# Patient Record
Sex: Male | Born: 1965 | Race: White | Hispanic: No | Marital: Single | Smoking: Current every day smoker
Health system: Southern US, Community
[De-identification: ages and names within clinical notes are randomized; demographics above are authoritative.]

## PROBLEM LIST (undated history)

## (undated) DIAGNOSIS — F419 Anxiety disorder, unspecified: Secondary | ICD-10-CM

## (undated) DIAGNOSIS — Z8719 Personal history of other diseases of the digestive system: Secondary | ICD-10-CM

## (undated) DIAGNOSIS — F32A Depression, unspecified: Secondary | ICD-10-CM

## (undated) DIAGNOSIS — K219 Gastro-esophageal reflux disease without esophagitis: Secondary | ICD-10-CM

## (undated) DIAGNOSIS — R0600 Dyspnea, unspecified: Secondary | ICD-10-CM

## (undated) DIAGNOSIS — D126 Benign neoplasm of colon, unspecified: Secondary | ICD-10-CM

## (undated) DIAGNOSIS — J449 Chronic obstructive pulmonary disease, unspecified: Secondary | ICD-10-CM

## (undated) DIAGNOSIS — I10 Essential (primary) hypertension: Secondary | ICD-10-CM

## (undated) DIAGNOSIS — K859 Acute pancreatitis without necrosis or infection, unspecified: Secondary | ICD-10-CM

## (undated) DIAGNOSIS — I639 Cerebral infarction, unspecified: Secondary | ICD-10-CM

## (undated) DIAGNOSIS — F1910 Other psychoactive substance abuse, uncomplicated: Secondary | ICD-10-CM

## (undated) DIAGNOSIS — N289 Disorder of kidney and ureter, unspecified: Secondary | ICD-10-CM

## (undated) DIAGNOSIS — F1210 Cannabis abuse, uncomplicated: Secondary | ICD-10-CM

## (undated) DIAGNOSIS — G4730 Sleep apnea, unspecified: Secondary | ICD-10-CM

## (undated) DIAGNOSIS — C801 Malignant (primary) neoplasm, unspecified: Secondary | ICD-10-CM

## (undated) DIAGNOSIS — G459 Transient cerebral ischemic attack, unspecified: Secondary | ICD-10-CM

## (undated) DIAGNOSIS — F329 Major depressive disorder, single episode, unspecified: Secondary | ICD-10-CM

## (undated) DIAGNOSIS — M1990 Unspecified osteoarthritis, unspecified site: Secondary | ICD-10-CM

## (undated) DIAGNOSIS — K5720 Diverticulitis of large intestine with perforation and abscess without bleeding: Secondary | ICD-10-CM

## (undated) DIAGNOSIS — G510 Bell's palsy: Secondary | ICD-10-CM

## (undated) HISTORY — PX: FOOT SURGERY: SHX648

## (undated) HISTORY — DX: Disorder of kidney and ureter, unspecified: N28.9

## (undated) HISTORY — DX: Transient cerebral ischemic attack, unspecified: G45.9

## (undated) HISTORY — DX: Cannabis abuse, uncomplicated: F12.10

## (undated) HISTORY — DX: Bell's palsy: G51.0

## (undated) HISTORY — DX: Chronic obstructive pulmonary disease, unspecified: J44.9

## (undated) HISTORY — DX: Anxiety disorder, unspecified: F41.9

## (undated) HISTORY — DX: Benign neoplasm of colon, unspecified: D12.6

## (undated) HISTORY — PX: APPENDECTOMY: SHX54

## (undated) HISTORY — DX: Acute pancreatitis without necrosis or infection, unspecified: K85.90

## (undated) HISTORY — DX: Other psychoactive substance abuse, uncomplicated: F19.10

## (undated) HISTORY — DX: Diverticulitis of large intestine with perforation and abscess without bleeding: K57.20

## (undated) HISTORY — PX: HERNIA REPAIR: SHX51

## (undated) HISTORY — DX: Essential (primary) hypertension: I10

## (undated) HISTORY — PX: UMBILICAL HERNIA REPAIR: SHX196

## (undated) HISTORY — PX: OTHER SURGICAL HISTORY: SHX169

---

## 2004-09-03 ENCOUNTER — Ambulatory Visit (HOSPITAL_COMMUNITY): Admission: RE | Admit: 2004-09-03 | Discharge: 2004-09-03 | Payer: Self-pay | Admitting: Family Medicine

## 2004-09-08 HISTORY — PX: KNEE SURGERY: SHX244

## 2005-05-01 ENCOUNTER — Emergency Department (HOSPITAL_COMMUNITY): Admission: EM | Admit: 2005-05-01 | Discharge: 2005-05-01 | Payer: Self-pay | Admitting: Emergency Medicine

## 2007-03-21 ENCOUNTER — Inpatient Hospital Stay (HOSPITAL_COMMUNITY): Admission: EM | Admit: 2007-03-21 | Discharge: 2007-03-24 | Payer: Self-pay | Admitting: Emergency Medicine

## 2009-10-04 ENCOUNTER — Encounter (INDEPENDENT_AMBULATORY_CARE_PROVIDER_SITE_OTHER): Payer: Self-pay | Admitting: *Deleted

## 2009-10-05 ENCOUNTER — Encounter (INDEPENDENT_AMBULATORY_CARE_PROVIDER_SITE_OTHER): Payer: Self-pay | Admitting: *Deleted

## 2009-10-05 ENCOUNTER — Ambulatory Visit (HOSPITAL_COMMUNITY): Admission: RE | Admit: 2009-10-05 | Discharge: 2009-10-05 | Payer: Self-pay | Admitting: Internal Medicine

## 2009-10-05 ENCOUNTER — Ambulatory Visit: Payer: Self-pay | Admitting: Internal Medicine

## 2009-10-05 DIAGNOSIS — K859 Acute pancreatitis without necrosis or infection, unspecified: Secondary | ICD-10-CM | POA: Insufficient documentation

## 2009-10-05 DIAGNOSIS — K921 Melena: Secondary | ICD-10-CM | POA: Insufficient documentation

## 2009-10-05 DIAGNOSIS — F419 Anxiety disorder, unspecified: Secondary | ICD-10-CM | POA: Insufficient documentation

## 2009-10-05 DIAGNOSIS — K920 Hematemesis: Secondary | ICD-10-CM | POA: Insufficient documentation

## 2009-10-05 DIAGNOSIS — F32A Depression, unspecified: Secondary | ICD-10-CM | POA: Insufficient documentation

## 2009-10-05 DIAGNOSIS — F1210 Cannabis abuse, uncomplicated: Secondary | ICD-10-CM | POA: Insufficient documentation

## 2009-10-05 DIAGNOSIS — I10 Essential (primary) hypertension: Secondary | ICD-10-CM | POA: Insufficient documentation

## 2009-10-05 DIAGNOSIS — F411 Generalized anxiety disorder: Secondary | ICD-10-CM | POA: Insufficient documentation

## 2009-10-10 ENCOUNTER — Encounter: Payer: Self-pay | Admitting: Internal Medicine

## 2009-10-12 ENCOUNTER — Encounter: Payer: Self-pay | Admitting: Internal Medicine

## 2010-08-08 DIAGNOSIS — K5720 Diverticulitis of large intestine with perforation and abscess without bleeding: Secondary | ICD-10-CM

## 2010-08-08 HISTORY — DX: Diverticulitis of large intestine with perforation and abscess without bleeding: K57.20

## 2010-08-08 HISTORY — PX: OTHER SURGICAL HISTORY: SHX169

## 2010-10-08 NOTE — Letter (Signed)
Summary: CT SCAN ORDER  CT SCAN ORDER   Imported By: Ricard Dillon 10/10/2009 12:33:42  _____________________________________________________________________  External Attachment:    Type:   Image     Comment:   External Document

## 2010-10-08 NOTE — Letter (Signed)
Summary: Out of Work Note  [facility]  [address]   [place], [place] [address]   Phone: [phone]  Fax: [phone]    10/05/2009  TO: Leodis Sias IT MAY CONCERN  RE: [name] [address] [id number] 07/30/1966       The above named individual is currently under my care and will be out of work    FROM: 10/05/2009   THROUGH: 10/06/2009    REASON: Patient is unable to work due to medical reasons.    MAY RETURN ON: 10/08/2009     If you have any further questions or need additional information, please call.     Sincerely,     [facility] R. Roetta Sessions, M.D.    Kassie Mends, M.D. Lorenza Burton, FNP-BC    Tana Coast, PA-C Phone: [phone]    Fax: [phone]

## 2010-10-08 NOTE — Letter (Signed)
Summary: Appointment Reminder  [facility]  [address]   [place], [place] [address]   Phone: [phone]  Fax: [phone]       October 04, 2009   ZAION HREHA [address] [place], [place]  [address] 04-Jul-1966    Dear Mr. Torian,  We have been unable to reach you by phone to schedule a follow up   appointment that was recommended for you by Dr. Jena Gauss. It is very   important that we reach you to schedule an appointment. We hope that you  allow [place] to participate in your health care needs. Please contact us at  [phone] at your earliest convenience to schedule your appointment.  Sincerely,    [facility] R. Roetta Sessions, M.D.    Kassie Mends, M.D. Lorenza Burton, FNP-BC    Tana Coast, PA-C Phone: [phone]    Fax: [phone]

## 2010-10-08 NOTE — Letter (Signed)
Summary: REFERRAL/[facility]/DR HOWARD  REFERRAL/[facility]/DR HOWARD   Imported By: Diana Eves 10/12/2009 10:30:27  _____________________________________________________________________  External Attachment:    Type:   Image     Comment:   External Document
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
## 2010-10-08 NOTE — Letter (Signed)
Summary: LABS/[facility]  LABS/[facility]   Imported By: Diana Eves 10/12/2009 11:59:52  _____________________________________________________________________  External Attachment:    Type:   Image     Comment:   External Document
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
## 2011-04-09 HISTORY — PX: OTHER SURGICAL HISTORY: SHX169

## 2011-05-10 HISTORY — PX: COLON SURGERY: SHX602

## 2011-06-23 LAB — DIFFERENTIAL
Basophils Absolute: 0
Basophils Relative: 0
Eosinophils Absolute: 0.1
Eosinophils Relative: 1
Lymphocytes Relative: 26
Lymphs Abs: 1.9
Monocytes Absolute: 0.6
Monocytes Relative: 8
Neutro Abs: 4.8
Neutrophils Relative %: 65

## 2011-06-23 LAB — CBC
HCT: 36.5 — ABNORMAL LOW
Hemoglobin: 12.7 — ABNORMAL LOW
MCHC: 34.7
MCV: 92.4
Platelets: 175
RBC: 3.95 — ABNORMAL LOW
RDW: 13
WBC: 7.4

## 2011-06-24 LAB — POCT I-STAT EG7
Acid-base deficit: 3 — ABNORMAL HIGH
Bicarbonate: 21.7
HCT: 46
Hemoglobin: 13.3
Hemoglobin: 15.6
O2 Saturation: 100
Operator id: 230421
Potassium: 4.4
Sodium: 136
Sodium: 137
TCO2: 23
pH, Ven: 7.294
pH, Ven: 7.39 — ABNORMAL HIGH

## 2011-06-24 LAB — BASIC METABOLIC PANEL
CO2: 30
Chloride: 99
Glucose, Bld: 109 — ABNORMAL HIGH
Potassium: 4.4
Sodium: 137

## 2011-06-24 LAB — ETHANOL: Alcohol, Ethyl (B): 192 — ABNORMAL HIGH

## 2011-06-24 LAB — CBC
HCT: 48
Hemoglobin: 16.5
Platelets: 247
WBC: 13.6 — ABNORMAL HIGH

## 2011-06-24 LAB — SAMPLE TO BLOOD BANK

## 2011-10-10 HISTORY — PX: OTHER SURGICAL HISTORY: SHX169

## 2012-02-09 ENCOUNTER — Encounter: Payer: Self-pay | Admitting: Internal Medicine

## 2012-02-09 ENCOUNTER — Other Ambulatory Visit (HOSPITAL_COMMUNITY): Payer: Self-pay | Admitting: Oral Surgery

## 2012-02-10 ENCOUNTER — Ambulatory Visit (INDEPENDENT_AMBULATORY_CARE_PROVIDER_SITE_OTHER): Payer: Medicaid Other | Admitting: Gastroenterology

## 2012-02-10 ENCOUNTER — Encounter: Payer: Self-pay | Admitting: Gastroenterology

## 2012-02-10 VITALS — BP 148/104 | HR 80 | Temp 97.1°F | Ht 66.0 in | Wt 190.4 lb

## 2012-02-10 DIAGNOSIS — G8929 Other chronic pain: Secondary | ICD-10-CM | POA: Insufficient documentation

## 2012-02-10 DIAGNOSIS — K5720 Diverticulitis of large intestine with perforation and abscess without bleeding: Secondary | ICD-10-CM | POA: Insufficient documentation

## 2012-02-10 DIAGNOSIS — R110 Nausea: Secondary | ICD-10-CM | POA: Insufficient documentation

## 2012-02-10 DIAGNOSIS — R1084 Generalized abdominal pain: Secondary | ICD-10-CM

## 2012-02-10 DIAGNOSIS — K5732 Diverticulitis of large intestine without perforation or abscess without bleeding: Secondary | ICD-10-CM

## 2012-02-10 DIAGNOSIS — R1013 Epigastric pain: Secondary | ICD-10-CM | POA: Insufficient documentation

## 2012-02-10 MED ORDER — OXYCODONE-ACETAMINOPHEN 7.5-325 MG PO TABS: 1.0000 | ORAL_TABLET | Freq: Every evening | ORAL | Status: DC | PRN

## 2012-02-10 NOTE — Assessment & Plan Note (Signed)
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 Patient required ileostomy prior to eventual takedown. He has had three surgeries related to this. Extensive lysis of adhesions with second surgery (over 3 hours). Suspect chronic abdominal pain secondary to multiple surgeries/adhesion. His upper GI tract has never been evaluated. Offered him EGD. Will augment conscious sedation with phenergan 25mg  iv 30 mins before procedure.   Retrieve more recent records and labs from [facility] and Dr. Fausto Skillern. Temporary RX for oxycodone to take at bedtime only. Ultimately he will likely need pain clinic referral. Patient states he cannot get to [facility], ?go to Dr. Gerilyn Pilgrim or [place].

## 2012-02-10 NOTE — Progress Notes (Signed)
Faxed to PCP

## 2012-02-10 NOTE — Progress Notes (Signed)
Primary Care Physician:  HOWARD, KEVIN, MD, MD  Primary Gastroenterologist:  Michael Rourk, MD   Chief Complaint  Patient presents with  . Abdominal Pain    HPI:  [name] is a 46 y.o. male here for further evaluation of chronic abdominal pain. He was last seen back in January 2011. At that time he had a several month history of him he'll call pain. In January 2011 he was diagnosed with acute pancreatitis that [facility]. He did have a CT of the abdomen pelvis at that time which was unremarkable except for stable T10 compression fracture. He was offered EGD and colonoscopy but he ended up incarcerated so the procedures were postponed.  While he was incarcerated, he finally convinced them to perform a colonoscopy. He states after he drank his prep he had abdominal pain and was found out that he had perforated diverticulitis. 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 This was in December 2011. In August 2012 he underwent a colonoscopy via stoma with endoscopy of Hartmann pouch for preoperative assessment for colostomy takedown. He had 2 four to six mm polyps in the transverse colon with complete resection with only partial retrieval. Path revealed adenomatous polyp. He also congested mucosa in the area 3 cm proximal to the stoma, erythematous because of Hartmann pouch.   In September 2012 he had exploratory laparotomy with colostomy takedown, mobilization of splenic and hepatic flexure, repair of rectal laceration, lysis of adhesions (greater than 3 hours), loop ileostomy. This procedure was complicated by abscess formation which required percutaneous drainage. I do not have the records, but the patient states that he underwent reversal of ileostomy in February of this year.  Patient complains of chronic periumbilical abdominal pain. Tolerates during the day with tramadol but every night he wakes up at 2am with sharp, persistent abdominal pain.  Used to be on oxycodone around the clock before Dr. Fanta gives him tramadol now. Used to be followed at pain clinic at [facility] but he has no way to get there now. [facility] will not see him (per patient "they don't handle abdominal pain".  Some pp nausea and abd pain. Pain periumbilical , sharp pain, stays for hours. Heartburn controlled on ranitidine. No dysphagia. Stools are real black last couple of days. Iron pills for one week. No rectal bleeding.   Current Outpatient Prescriptions  Medication Sig Dispense Refill  . ALPRAZolam (XANAX) 1 MG tablet Take 1 mg by mouth at bedtime as needed.      . cyclobenzaprine (FLEXERIL) 5 MG tablet Take 5 mg by mouth 3 (three) times daily as needed.      . ferrous sulfate 325 (65 FE) MG tablet Take 325 mg by mouth 2 (two) times daily.      . gabapentin (NEURONTIN) 300 MG capsule Take 300 mg by mouth 3 (three) times daily.      . PARoxetine (PAXIL) 20 MG tablet Take 20 mg by mouth every morning.      . ranitidine (ZANTAC) 150 MG capsule Take 150 mg by mouth 2 (two) times daily.      . simvastatin (ZOCOR) 20 MG tablet Take 20 mg by mouth daily.      . sodium bicarbonate 650 MG tablet Take 650 mg by mouth 2 (two) times daily.      . traMADol (ULTRAM) 50 MG tablet Take 50 mg by mouth every 6 (six) hours as needed.        Allergies as of   02/10/2012 - Review Complete 02/10/2012  Allergen Reaction Noted  . Codeine Itching   . Morphine Hives    Past Medical History  Diagnosis Date  . Acute pancreatitis   . Cannabis abuse   . Anxiety   . Hypertension   . Diverticulitis of colon with perforation 08/2010  . Renal insufficiency     follwed by Dr. Befakadu   Past Surgical History  Procedure Date  . Appendectomy   . Umbilical hernia repair   . Right knee surgery   . 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 complicated diverticulitis  . Colonoscopy via stoma with endoscopy of hartmann pouch 04/2011    [facility]. two  sessile polyps in trv colon. complete resection but only partial retrieval. congested mucosa in area 3cm proximal to stoma, erythematous mucosa in Hartmann pouch. repeat tcs 8/2017path-adenomatous polyp  . Colon surgery 05/2011    [facility]. exp laparotomy with colostomy takedown, lysis of adhesions (3hours), loop ileostomy. complicated by abscess formation requiring percutaneous drainage  . Reversal of colostomy 10/2011    per patient, did not have report   Family History  Problem Relation Age of Onset  . Stomach cancer Father   . Lung cancer Father   . Throat cancer Father   . Colon cancer Neg Hx   . Liver disease Cousin     cirrhosis, etoh    History   Social History  . Marital Status: Single    Spouse Name: N/A    Number of Children: N/A  . Years of Education: N/A   Occupational History  . Not on file.   Social History Main Topics  . Smoking status: Current Everyday Smoker -- 1.0 packs/day    Types: Cigarettes  . Smokeless tobacco: Not on file  . Alcohol Use: No     former user quit in 2011  . Drug Use: Yes    Special: Marijuana     last time was a week ago  . Sexually Active: Not on file   Other Topics Concern  . Not on file   Social History Narrative  . No narrative on file      ROS:  General: Negative for anorexia, weight loss, fever, chills, fatigue, weakness. Eyes: Negative for vision changes.  ENT: Negative for hoarseness, difficulty swallowing , nasal congestion. To have teeth pulled this month CV: Negative for chest pain, angina, palpitations, dyspnea on exertion, peripheral edema.  Respiratory: Negative for dyspnea at rest, dyspnea on exertion, cough, sputum, wheezing.  GI: See history of present illness. GU:  Negative for dysuria, hematuria, urinary incontinence, urinary frequency, nocturnal urination.  MS: Negative for joint pain, low back pain.  Derm: Negative for rash or itching.  Neuro: Negative for weakness, abnormal sensation, seizure, frequent  headaches, memory loss, confusion.  Psych: c/o anxiety. no depression, suicidal ideation, hallucinations.  Endo: Negative for unusual weight change.  Heme: Negative for bruising or bleeding. Allergy: Negative for rash or hives.    Physical Examination:  BP 148/104  Pulse 80  Temp(Src) 97.1 F (36.2 C) (Temporal)  Ht 5' 6" (1.676 m)  Wt 190 lb 6.4 oz (86.365 kg)  BMI 30.73 kg/m2   General: Well-nourished, well-developed in no acute distress.  Head: Normocephalic, atraumatic.   Eyes: Conjunctiva pink, no icterus. Mouth: Oropharyngeal mucosa moist and pink , no lesions erythema or exudate. Teeth in poor repair. Neck: Supple without thyromegaly, masses, or lymphadenopathy.  Lungs: Clear to auscultation bilaterally.  Heart: Regular rate and   rhythm, no murmurs rubs or gallops.  Abdomen: Bowel sounds are normal, diffuse tenderness especially in midline and epigastrium, nondistended, no hepatosplenomegaly or masses, no abdominal bruits or    hernia , no rebound or guarding.  Extensive scarring in midline, two ostomy sites.  Rectal: not performed Extremities: No lower extremity edema. No clubbing or deformities.  Neuro: Alert and oriented x 4 , grossly normal neurologically.  Skin: Warm and dry, no rash or jaundice.   Psych: Alert and cooperative, normal mood and affect.    

## 2012-02-10 NOTE — Patient Instructions (Signed)
We will review all your past records.  We have scheduled you for an upper endoscopy to further evaluate your abdominal pain. Please see separate instructions.

## 2012-02-12 ENCOUNTER — Encounter (HOSPITAL_COMMUNITY): Payer: Self-pay | Admitting: Pharmacy Technician

## 2012-02-17 ENCOUNTER — Encounter (HOSPITAL_COMMUNITY): Payer: Self-pay

## 2012-02-17 NOTE — Pre-Procedure Instructions (Signed)
20 CAEDAN SUMLER  02/17/2012   Your procedure is scheduled on:  February 23, 2012 @ 10:30   Report to [facility] at 7:30 AM.  Call this number if you have problems the morning of surgery: [phone]   Remember:   Do not eat food:After Midnight.  May have clear liquids: up to 4 Hours before arrival.  Clear liquids include soda, tea, black coffee, apple or grape juice, broth.  Take these medicines the morning of surgery with A SIP OF WATER: xanax, pain pill as needed ,paxil, zantac   Do not wear jewelry, make-up or nail polish.  Do not wear lotions, powders, or perfumes. You may wear deodorant.  Do not shave 48 hours prior to surgery. Men may shave face and neck.  Do not bring valuables to the hospital.  Contacts, dentures or bridgework may not be worn into surgery.  Leave suitcase in the car. After surgery it may be brought to your room.  For patients admitted to the hospital, checkout time is 11:00 AM the day of discharge.   Patients discharged the day of surgery will not be allowed to drive home.  Name and phone number of your driver: Crandall Harvel  Special Instructions: CHG Shower Use Special Wash: 1/2 bottle night before surgery and 1/2 bottle morning of surgery.   Please read over the following fact sheets that you were given: Pain Booklet, Coughing and Deep Breathing and Surgical Site Infection Prevention

## 2012-02-19 ENCOUNTER — Encounter (HOSPITAL_COMMUNITY)
Admission: RE | Admit: 2012-02-19 | Discharge: 2012-02-19 | Disposition: A | Discharge status: Home / Self Care | Payer: Medicaid Other | Source: Ambulatory Visit | Attending: Oral Surgery | Admitting: Oral Surgery

## 2012-02-19 LAB — COMPREHENSIVE METABOLIC PANEL
Albumin: 3.8 g/dL (ref 3.5–5.2)
BUN: 17 mg/dL (ref 6–23)
CO2: 25 mEq/L (ref 19–32)
Calcium: 10.2 mg/dL (ref 8.4–10.5)
Chloride: 103 mEq/L (ref 96–112)
Creatinine, Ser: 1.15 mg/dL (ref 0.50–1.35)
GFR calc non Af Amer: 75 mL/min — ABNORMAL LOW (ref >90)
Total Bilirubin: 0.1 mg/dL — ABNORMAL LOW (ref 0.3–1.2)

## 2012-02-19 LAB — CBC
HCT: 44.7 % (ref 39.0–52.0)
MCH: 30.3 pg (ref 26.0–34.0)
MCV: 89 fL (ref 78.0–100.0)
RDW: 14.6 % (ref 11.5–15.5)
WBC: 7 10*3/uL (ref 4.0–10.5)

## 2012-02-19 NOTE — Progress Notes (Signed)
I spoke with Selena Batten at Dr. Randa Evens office and asked to have orders signed by MD- pt arriving for a re- op lab appointment today.

## 2012-02-19 NOTE — H&P (Signed)
HISTORY AND PHYSICAL  [name] is a 46 y.o. male patient with CC: Painful teeth  No diagnosis found.  Past Medical History  Diagnosis Date  . Acute pancreatitis   . Cannabis abuse   . Anxiety   . Diverticulitis of colon with perforation 08/2010  . Renal insufficiency     follwed by Dr. Fausto Skillern  . GERD (gastroesophageal reflux disease)   . Hypertension     taken off lisinopril by renal md 08/2011    No current facility-administered medications for this encounter.   Current Outpatient Prescriptions  Medication Sig Dispense Refill  . ALPRAZolam (XANAX) 1 MG tablet Take 1 mg by mouth 4 (four) times daily.       . cyclobenzaprine (FLEXERIL) 5 MG tablet Take 5 mg by mouth 3 (three) times daily.       . ferrous sulfate 325 (65 FE) MG tablet Take 325 mg by mouth 2 (two) times daily.      Marland Kitchen gabapentin (NEURONTIN) 300 MG capsule Take 300 mg by mouth 3 (three) times daily.      Marland Kitchen oxyCODONE-acetaminophen (PERCOCET) 7.5-325 MG per tablet Take 1 tablet by mouth at bedtime as needed. For sleep/pain      . PARoxetine (PAXIL) 20 MG tablet Take 20 mg by mouth every morning.      . ranitidine (ZANTAC) 150 MG capsule Take 150 mg by mouth 2 (two) times daily.      . simvastatin (ZOCOR) 20 MG tablet Take 20 mg by mouth daily.      . sodium bicarbonate 650 MG tablet Take 650 mg by mouth 2 (two) times daily.      . traMADol (ULTRAM) 50 MG tablet Take 50 mg by mouth every 6 (six) hours as needed. For pain       Allergies  Allergen Reactions  . Codeine Itching  . Morphine Hives   Active Problems:  * No active hospital problems. *   Vitals: There were no vitals taken for this visit. Lab results:No results found for this or any previous visit (from the past 24 hour(s)). Radiology Results: No results found. General appearance: alert and cooperative Head: Normocephalic, without obvious abnormality, atraumatic Eyes: negative Nose: Nares normal. Septum midline. Mucosa normal. No drainage or  sinus tenderness. Throat: Severe dental caries teeth #'s 2, 4, 5, 6, 7, 8, 9, 10, 11, 12, 13, 22, 23, 24, 27, 28, 29, 31, 32, Bilateral Mandibular Tori Neck: no adenopathy and supple, symmetrical, trachea midline Resp: wheezes RUL Cardio: regular rate and rhythm, S1, S2 normal, no murmur, click, rub or gallop  Assessment:45 WM Hypertension, pancreatitis, GERD, Renal insufficiency with non-restorable teeth #'s 2, 4, 5, 6, 7, 8, 9, 10, 11, 12, 13, 22, 23, 24, 27, 28, 29, 31, 32, Bilateral Mandibular Tori.  Plan: Extraction teeth #'s 2, 4, 5, 6, 7, 8, 9, 10, 11, 12, 13, 22, 23, 24, 27, 28, 29, 31, 32, Alveoloplasty, removal Bilateral Mandibular Tori.   Georgia Lopes 02/19/2012

## 2012-02-23 ENCOUNTER — Ambulatory Visit (HOSPITAL_COMMUNITY): Payer: Medicaid Other | Admitting: Certified Registered Nurse Anesthetist

## 2012-02-23 ENCOUNTER — Encounter (HOSPITAL_COMMUNITY): Payer: Self-pay | Admitting: Certified Registered Nurse Anesthetist

## 2012-02-23 ENCOUNTER — Encounter (HOSPITAL_COMMUNITY)
Admission: RE | Disposition: A | Discharge status: Home / Self Care | Payer: Self-pay | Source: Ambulatory Visit | Attending: Oral Surgery

## 2012-02-23 ENCOUNTER — Ambulatory Visit (HOSPITAL_COMMUNITY): Payer: Medicaid Other

## 2012-02-23 ENCOUNTER — Ambulatory Visit (HOSPITAL_COMMUNITY)
Admission: RE | Admit: 2012-02-23 | Discharge: 2012-02-23 | Disposition: A | Discharge status: Home / Self Care | Payer: Medicaid Other | Source: Ambulatory Visit | Attending: Oral Surgery | Admitting: Oral Surgery

## 2012-02-23 ENCOUNTER — Encounter (HOSPITAL_COMMUNITY): Payer: Self-pay | Admitting: *Deleted

## 2012-02-23 DIAGNOSIS — M278 Other specified diseases of jaws: Secondary | ICD-10-CM | POA: Insufficient documentation

## 2012-02-23 DIAGNOSIS — R1013 Epigastric pain: Secondary | ICD-10-CM

## 2012-02-23 DIAGNOSIS — K05229 Aggressive periodontitis, generalized, unspecified severity: Secondary | ICD-10-CM

## 2012-02-23 DIAGNOSIS — F17200 Nicotine dependence, unspecified, uncomplicated: Secondary | ICD-10-CM | POA: Insufficient documentation

## 2012-02-23 DIAGNOSIS — R110 Nausea: Secondary | ICD-10-CM

## 2012-02-23 DIAGNOSIS — I10 Essential (primary) hypertension: Secondary | ICD-10-CM

## 2012-02-23 DIAGNOSIS — K5720 Diverticulitis of large intestine with perforation and abscess without bleeding: Secondary | ICD-10-CM

## 2012-02-23 DIAGNOSIS — K921 Melena: Secondary | ICD-10-CM

## 2012-02-23 DIAGNOSIS — F1210 Cannabis abuse, uncomplicated: Secondary | ICD-10-CM

## 2012-02-23 DIAGNOSIS — N289 Disorder of kidney and ureter, unspecified: Secondary | ICD-10-CM | POA: Insufficient documentation

## 2012-02-23 DIAGNOSIS — K056 Periodontal disease, unspecified: Secondary | ICD-10-CM | POA: Insufficient documentation

## 2012-02-23 DIAGNOSIS — K859 Acute pancreatitis without necrosis or infection, unspecified: Secondary | ICD-10-CM

## 2012-02-23 DIAGNOSIS — K219 Gastro-esophageal reflux disease without esophagitis: Secondary | ICD-10-CM | POA: Insufficient documentation

## 2012-02-23 DIAGNOSIS — K920 Hematemesis: Secondary | ICD-10-CM

## 2012-02-23 DIAGNOSIS — K029 Dental caries, unspecified: Secondary | ICD-10-CM | POA: Insufficient documentation

## 2012-02-23 DIAGNOSIS — Z01812 Encounter for preprocedural laboratory examination: Secondary | ICD-10-CM | POA: Insufficient documentation

## 2012-02-23 DIAGNOSIS — F411 Generalized anxiety disorder: Secondary | ICD-10-CM

## 2012-02-23 DIAGNOSIS — G8929 Other chronic pain: Secondary | ICD-10-CM

## 2012-02-23 HISTORY — PX: MULTIPLE EXTRACTIONS WITH ALVEOLOPLASTY: SHX5342

## 2012-02-23 HISTORY — DX: Gastro-esophageal reflux disease without esophagitis: K21.9

## 2012-02-23 SURGERY — MULTIPLE EXTRACTION WITH ALVEOLOPLASTY
Anesthesia: General | Laterality: Bilateral | Wound class: Clean Contaminated

## 2012-02-23 MED ORDER — LIDOCAINE HCL (CARDIAC) 20 MG/ML IV SOLN
INTRAVENOUS | Status: DC | PRN
  Administered 2012-02-23: 100 mg via INTRAVENOUS

## 2012-02-23 MED ORDER — GLYCOPYRROLATE 0.2 MG/ML IJ SOLN
INTRAMUSCULAR | Status: DC | PRN
  Administered 2012-02-23: .8 mg via INTRAVENOUS

## 2012-02-23 MED ORDER — KETOROLAC TROMETHAMINE 30 MG/ML IJ SOLN: 15.0000 mg | Freq: Once | INTRAMUSCULAR | Status: DC | PRN

## 2012-02-23 MED ORDER — LIDOCAINE-EPINEPHRINE 2 %-1:100000 IJ SOLN
20.0000 mL | INTRAMUSCULAR | Status: AC
  Administered 2012-02-23: 18 mL
  Filled 2012-02-23: qty 20

## 2012-02-23 MED ORDER — EPHEDRINE SULFATE 50 MG/ML IJ SOLN
INTRAMUSCULAR | Status: DC | PRN
  Administered 2012-02-23: 20 mg via INTRAVENOUS

## 2012-02-23 MED ORDER — FENTANYL CITRATE 0.05 MG/ML IJ SOLN
INTRAMUSCULAR | Status: DC | PRN
  Administered 2012-02-23 (×2): 50 ug via INTRAVENOUS
  Administered 2012-02-23: 150 ug via INTRAVENOUS

## 2012-02-23 MED ORDER — STERILE WATER FOR IRRIGATION IR SOLN
Status: DC | PRN
  Administered 2012-02-23: 1000 mL

## 2012-02-23 MED ORDER — OXYCODONE-ACETAMINOPHEN 10-325 MG PO TABS: 1.0000 | ORAL_TABLET | ORAL | Status: DC | PRN

## 2012-02-23 MED ORDER — HYDROMORPHONE HCL PF 1 MG/ML IJ SOLN
INTRAMUSCULAR | Status: AC
  Filled 2012-02-23: qty 1

## 2012-02-23 MED ORDER — MIDAZOLAM HCL 5 MG/5ML IJ SOLN
INTRAMUSCULAR | Status: DC | PRN
  Administered 2012-02-23: 2 mg via INTRAVENOUS

## 2012-02-23 MED ORDER — MEPERIDINE HCL 25 MG/ML IJ SOLN: 6.2500 mg | INTRAMUSCULAR | Status: DC | PRN

## 2012-02-23 MED ORDER — ROCURONIUM BROMIDE 100 MG/10ML IV SOLN
INTRAVENOUS | Status: DC | PRN
  Administered 2012-02-23: 40 mg via INTRAVENOUS

## 2012-02-23 MED ORDER — HYDROMORPHONE HCL PF 1 MG/ML IJ SOLN
0.2500 mg | INTRAMUSCULAR | Status: DC | PRN
  Administered 2012-02-23: 0.5 mg via INTRAVENOUS

## 2012-02-23 MED ORDER — PROPOFOL 10 MG/ML IV BOLUS
INTRAVENOUS | Status: DC | PRN
  Administered 2012-02-23: 200 mg via INTRAVENOUS

## 2012-02-23 MED ORDER — NEOSTIGMINE METHYLSULFATE 1 MG/ML IJ SOLN
INTRAMUSCULAR | Status: DC | PRN
  Administered 2012-02-23: 4 mg via INTRAVENOUS

## 2012-02-23 MED ORDER — LACTATED RINGERS IV SOLN
INTRAVENOUS | Status: DC
Start: 2012-02-23 — End: 2012-02-23
  Administered 2012-02-23: 09:00:00 via INTRAVENOUS

## 2012-02-23 MED ORDER — LACTATED RINGERS IV SOLN
INTRAVENOUS | Status: DC | PRN
  Administered 2012-02-23 (×2): via INTRAVENOUS

## 2012-02-23 MED ORDER — ONDANSETRON HCL 4 MG/2ML IJ SOLN
INTRAMUSCULAR | Status: DC | PRN
  Administered 2012-02-23: 4 mg via INTRAVENOUS

## 2012-02-23 MED ORDER — CEFAZOLIN SODIUM 1-5 GM-% IV SOLN
INTRAVENOUS | Status: AC
  Filled 2012-02-23: qty 50

## 2012-02-23 MED ORDER — LABETALOL HCL 5 MG/ML IV SOLN
INTRAVENOUS | Status: DC | PRN
  Administered 2012-02-23 (×3): 5 mg via INTRAVENOUS

## 2012-02-23 MED ORDER — SODIUM CHLORIDE 0.9 % IR SOLN
Status: DC | PRN
  Administered 2012-02-23: 1000 mL

## 2012-02-23 SURGICAL SUPPLY — 28 items
BUR CROSS CUT FISSURE 1.6 (BURR) ×2 IMPLANT
BUR EGG ELITE 4.0 (BURR) ×2 IMPLANT
CANISTER SUCTION 2500CC (MISCELLANEOUS) ×2 IMPLANT
CLOTH BEACON ORANGE TIMEOUT ST (SAFETY) ×2 IMPLANT
COVER SURGICAL LIGHT HANDLE (MISCELLANEOUS) ×2 IMPLANT
CRADLE DONUT ADULT HEAD (MISCELLANEOUS) ×2 IMPLANT
DECANTER SPIKE VIAL GLASS SM (MISCELLANEOUS) ×2 IMPLANT
GAUZE PACKING FOLDED 2  STR (GAUZE/BANDAGES/DRESSINGS) ×1
GAUZE PACKING FOLDED 2 STR (GAUZE/BANDAGES/DRESSINGS) ×1 IMPLANT
GLOVE BIO SURGEON STRL SZ 6.5 (GLOVE) ×2 IMPLANT
GLOVE BIO SURGEON STRL SZ7 (GLOVE) IMPLANT
GLOVE BIO SURGEON STRL SZ7.5 (GLOVE) ×2 IMPLANT
GLOVE BIOGEL PI IND STRL 7.0 (GLOVE) ×1 IMPLANT
GLOVE BIOGEL PI INDICATOR 7.0 (GLOVE) ×1
GOWN STRL NON-REIN LRG LVL3 (GOWN DISPOSABLE) ×4 IMPLANT
GOWN STRL REIN XL XLG (GOWN DISPOSABLE) ×2 IMPLANT
KIT BASIN OR (CUSTOM PROCEDURE TRAY) ×2 IMPLANT
KIT ROOM TURNOVER OR (KITS) ×2 IMPLANT
NEEDLE 22X1 1/2 (OR ONLY) (NEEDLE) ×2 IMPLANT
NS IRRIG 1000ML POUR BTL (IV SOLUTION) ×2 IMPLANT
PAD ARMBOARD 7.5X6 YLW CONV (MISCELLANEOUS) ×4 IMPLANT
SUT CHROMIC 3 0 PS 2 (SUTURE) ×3 IMPLANT
SYR 50ML SLIP (SYRINGE) IMPLANT
TOWEL OR 17X26 10 PK STRL BLUE (TOWEL DISPOSABLE) ×2 IMPLANT
TRAY ENT MC OR (CUSTOM PROCEDURE TRAY) ×2 IMPLANT
TUBING IRRIGATION (MISCELLANEOUS) IMPLANT
WATER STERILE IRR 1000ML POUR (IV SOLUTION) IMPLANT
YANKAUER SUCT BULB TIP NO VENT (SUCTIONS) ×2 IMPLANT

## 2012-02-23 NOTE — Progress Notes (Signed)
Requested cxr from [facility] at 0800 this am.

## 2012-02-23 NOTE — Op Note (Signed)
02/23/2012  11:23 AM  PATIENT:  Princella Pellegrini  46 y.o. male  PRE-OPERATIVE DIAGNOSIS:  NONRESTORABLE TEETH #'s 2, 4, 5, 6, 7, 8, 9, 10, 11, 12, 13, 22, 23, 24, 27, 28, 29, 31, 32,  MANDIBULAR TORUS LEFT   POST-OPERATIVE DIAGNOSIS:  SAME  PROCEDURE:  Procedure(s): MULTIPLE EXTRACITON TEETH #'s 2, 4, 5, 6, 7, 8, 9, 10, 11, 12, 13, 22, 23, 24, 27, 28, 29, 31, 32,  Removal MANDIBULAR TORUS LEFT,WITH ALVEOLOPLASTY Maxilla and mandible  SURGEON:  Surgeon(s): Georgia Lopes, DDS  ANESTHESIA:   local and general  EBL:  minimal  DRAINS: none   SPECIMEN:  No Specimen  COUNTS:  YES  PLAN OF CARE: Discharge to home after PACU  PATIENT DISPOSITION:  PACU - hemodynamically stable.   PROCEDURE DETAILS: Dictation #   Georgia Lopes, DMD 02/23/2012 11:23 AM

## 2012-02-23 NOTE — Anesthesia Postprocedure Evaluation (Signed)
  Anesthesia Post-op Note  Patient: [name]  Procedure(s) Performed: Procedure(s) (LRB): MULTIPLE EXTRACION WITH ALVEOLOPLASTY (Bilateral)  Patient Location: PACU  Anesthesia Type: General  Level of Consciousness: awake  Airway and Oxygen Therapy: Patient Spontanous Breathing  Post-op Pain: mild  Post-op Assessment: Post-op Vital signs reviewed  Post-op Vital Signs: stable2  Complications: No apparent anesthesia complications

## 2012-02-23 NOTE — Patient Instructions (Signed)
SMOKING CESSATION HELP: ° °Don’t use tobacco products: Call [phone] for an appointment to help you quit using tobacco ° ° °

## 2012-02-23 NOTE — Transfer of Care (Signed)
Immediate Anesthesia Transfer of Care Note  Patient: [name]  Procedure(s) Performed: Procedure(s) (LRB): MULTIPLE EXTRACION WITH ALVEOLOPLASTY (Bilateral)  Patient Location: PACU  Anesthesia Type: General  Level of Consciousness: awake, alert , oriented and patient cooperative  Airway & Oxygen Therapy: Patient Spontanous Breathing and Patient connected to face mask oxygen  Post-op Assessment: Report given to PACU RN, Post -op Vital signs reviewed and stable and Patient moving all extremities X 4  Post vital signs: Reviewed and stable  Complications: No apparent anesthesia complications

## 2012-02-23 NOTE — Anesthesia Preprocedure Evaluation (Addendum)
Anesthesia Evaluation  Patient identified by MRN, date of birth, ID band Patient awake    Reviewed: Allergy & Precautions, H&P , NPO status , Patient's Chart, lab work & pertinent test results  History of Anesthesia Complications Negative for: history of anesthetic complications  Airway Mallampati: II TM Distance: >3 FB Neck ROM: Full    Dental  (+) Poor Dentition and Dental Advisory Given   Pulmonary neg pulmonary ROS, Current Smoker,  breath sounds clear to auscultation        Cardiovascular hypertension, Rhythm:Regular Rate:Normal     Neuro/Psych Anxiety    GI/Hepatic GERD-  Medicated and Controlled,(+)     substance abuse  marijuana use,   Endo/Other    Renal/GU Renal InsufficiencyRenal disease     Musculoskeletal   Abdominal (+) + obese,   Peds  Hematology   Anesthesia Other Findings   Reproductive/Obstetrics                          Anesthesia Physical Anesthesia Plan  ASA: II  Anesthesia Plan: General   Post-op Pain Management:    Induction: Intravenous  Airway Management Planned: Nasal ETT  Additional Equipment:   Intra-op Plan:   Post-operative Plan: Extubation in OR  Informed Consent: I have reviewed the patients History and Physical, chart, labs and discussed the procedure including the risks, benefits and alternatives for the proposed anesthesia with the patient or authorized representative who has indicated his/her understanding and acceptance.   Dental advisory given  Plan Discussed with: CRNA, Surgeon and Anesthesiologist  Anesthesia Plan Comments:        Anesthesia Quick Evaluation

## 2012-02-23 NOTE — Preoperative (Signed)
Beta Blockers   Reason not to administer Beta Blockers:Not Applicable 

## 2012-02-23 NOTE — Anesthesia Procedure Notes (Signed)
Procedure Name: Intubation Date/Time: 02/23/2012 10:31 AM Performed by: Rogelia Boga Pre-anesthesia Checklist: Patient identified, Emergency Drugs available, Suction available, Patient being monitored and Timeout performed Patient Re-evaluated:Patient Re-evaluated prior to inductionOxygen Delivery Method: Circle system utilized Preoxygenation: Pre-oxygenation with 100% oxygen Intubation Type: IV induction Ventilation: Mask ventilation without difficulty Laryngoscope Size: Mac and 4 Grade View: Grade I Nasal Tubes: Nasal prep performed, Right, Magill forceps- large, utilized and Nasal Rae Tube size: 6.0 mm Number of attempts: 1 Placement Confirmation: ETT inserted through vocal cords under direct vision,  positive ETCO2 and breath sounds checked- equal and bilateral Tube secured with: Tape Dental Injury: Teeth and Oropharynx as per pre-operative assessment

## 2012-02-23 NOTE — Op Note (Signed)
NAME:  [name], [name] NO.:  [id number]  MEDICAL RECORD NO.:  [id number]  LOCATION:  [facility]                         FACILITY:  [facility]  PHYSICIAN:  [name], M.D.  DATE OF BIRTH:  30-Mar-1966  DATE OF PROCEDURE: DATE OF DISCHARGE:  02/23/2012                              OPERATIVE REPORT   PREOPERATIVE DIAGNOSES:  Nonrestorable teeth numbers 2, 4, 5, 6, 7, 8, 9, 10, 11, 12, 13, 22, 23, 24, 27, 28, 29, 31, 32.  Mandibular torus of left mandible.  POSTOPERATIVE DIAGNOSES:  Nonrestorable teeth numbers 2, 4, 5, 6, 7, 8, 9, 10, 11, 12, 13, 22, 23, 24, 27, 28, 29, 31, 32.  Mandibular torus of left mandible.  PROCEDURE:  Removal of teeth numbers 2, 4, 5, 6, 7, 8, 9, 10, 11, 12, 13, 22, 23, 24, 27, 28, 29, 31, 32.  Alveoplasty of right and left maxilla.  Removal of mandibular left lingual torus.  SURGEON:  [name], M.D.  ANESTHESIA:  General, nasal intubation.  ASSISTANTS:  [name].  [name], DA.  INDICATION FOR PROCEDURE:  [name] is a 46 year old male who is referred by his general dentist for removal of all remaining teeth secondary to severe dental decay and periodontal disease.  In addition, there was a torus noted on the left lingual of the mandible, which would preclude fabrication of a lower denture.  Because of the number of teeth to be removed and the nature of the surgery performed, it was recommended that the patient undergo the procedure with general anesthesia for airway protection and safety.  PROCEDURE:  The patient was taken to the operating room and placed on the table in supine position.  General anesthesia was administered intravenously and a nasal endotracheal tube was placed and secured.  The eyes were protected.  The patient was draped for the procedure.  A time- out was performed.  The posterior pharynx was suctioned and a throat pack was placed.  A 2% lidocaine with 1:100,000 epinephrine was infiltrated in the inferior  alveolar block on the right and left side, and a buccal and palatal infiltration on the maxilla around the teeth to be removed, total of 18 mL was utilized.  A bite block was placed on the right side of the mouth and a sweetheart retractor was used to retract the tongue.  A 15 blade was used to make a full-thickness incision around over the alveolar ridge in the mandible, beginning in the molar region and carrying anteriorly, buccally, and lingually around teeth numbers 20, 22, 23, 24, and crossing the midline to attach the tooth number 17.  In the maxilla, the 15 blade was used to make an incision beginning with a wedge distal to tooth number 13 and carrying anteriorly around teeth numbers 12, 11, 10, 9, 8, 7 on the buccal and palatal aspect.  The periosteal elevator was then used to reflect the periosteum in the maxilla and mandible, and then the teeth were elevated with a 301 elevator and removed from the mouth with the #150 forceps in the maxilla and Asch forceps in the mandible.  Teeth numbers 11 and 22 fractured during attempted removal and therefore, additional  bone was removed around these teeth using the Stryker handpiece with a fissure bur under irrigation.  The teeth were removed with the forceps and then the sockets were curetted.  The periosteum was reflected further to allow for visualization of the alveolar bone and an alveoplasty was performed with the egg-shaped bur and bone file.  In the left mandible, the periosteum was reflected lingually to expose the mandibular tori.  This was removed using the egg-shaped bur and bone file while the Seldin retractor was used to retract the floor of the mouth tissues.  Then, the left maxilla and mandible were irrigated and closed with 3-0 chromic. The bite block and sweetheart retractor were repositioned to the other side of the mouth and a 15 blade was used to make a full-thickness incision around teeth numbers 27, 28, 29, 31, 32 on  the buccal and lingual aspects in the mandible and around teeth numbers 2, 4, 5, and 6 in the maxilla on the buccal and palatal aspects.  The periosteum was reflected from around these teeth.  The teeth were elevated with 301 elevator and then removed from the mouth with the Asch and upper #150 forceps.  Teeth numbers 6 and 27 fractured during removal.  Additional bone was removed using the fissure handpiece and the Stryker drill under irrigation.  Then after the teeth were removed with forceps, the alveoplasty was performed deflecting the periosteum and exposing the alveolar crest using the egg-shaped bur and bone file.  Once the alveoplasty was performed, the areas were irrigated and curetted and closed with 3-0 chromic.  The oral cavity was inspected and found to have good contour, hemostasis, and closure.  The oral cavity was irrigated and suctioned.  Throat pack was removed.  The patient was awakened, extubated, taken to recovery room, breathing spontaneously in good condition.  ESTIMATED BLOOD LOSS:  Minimum.  COMPLICATIONS:  None.  SPECIMENS:  None.     [name], M.D.     SMJ/MEDQ  D:  02/23/2012  T:  02/23/2012  Job:  [id number]

## 2012-02-23 NOTE — H&P (Signed)
H&P documentation  -History and Physical Reviewed  -Patient has been re-examined  -No change in the plan of care  [name]  

## 2012-02-23 NOTE — Anesthesia Postprocedure Evaluation (Signed)
  Anesthesia Post-op Note  Patient: [name]  Procedure(s) Performed: Procedure(s) (LRB): MULTIPLE EXTRACION WITH ALVEOLOPLASTY (Bilateral)  Patient Location: PACU  Anesthesia Type: General  Level of Consciousness: awake  Airway and Oxygen Therapy: Patient Spontanous Breathing  Post-op Pain: mild  Post-op Assessment: Post-op Vital signs reviewed  Post-op Vital Signs: stable  Complications: No apparent anesthesia complications

## 2012-02-24 ENCOUNTER — Encounter (HOSPITAL_COMMUNITY): Payer: Self-pay | Admitting: Oral Surgery

## 2012-02-26 ENCOUNTER — Encounter (HOSPITAL_COMMUNITY): Payer: Self-pay | Admitting: Pharmacy Technician

## 2012-03-05 ENCOUNTER — Ambulatory Visit (HOSPITAL_COMMUNITY)
Admission: RE | Admit: 2012-03-05 | Discharge: 2012-03-05 | Disposition: A | Discharge status: Home Health | Payer: Medicaid Other | Source: Ambulatory Visit | Attending: Internal Medicine | Admitting: Internal Medicine

## 2012-03-05 ENCOUNTER — Encounter (HOSPITAL_COMMUNITY): Payer: Self-pay | Admitting: *Deleted

## 2012-03-05 ENCOUNTER — Encounter (HOSPITAL_COMMUNITY)
Admission: RE | Disposition: A | Discharge status: Home Health | Payer: Self-pay | Source: Ambulatory Visit | Attending: Internal Medicine

## 2012-03-05 DIAGNOSIS — Z79899 Other long term (current) drug therapy: Secondary | ICD-10-CM | POA: Insufficient documentation

## 2012-03-05 DIAGNOSIS — R933 Abnormal findings on diagnostic imaging of other parts of digestive tract: Secondary | ICD-10-CM

## 2012-03-05 DIAGNOSIS — G8929 Other chronic pain: Secondary | ICD-10-CM

## 2012-03-05 DIAGNOSIS — I10 Essential (primary) hypertension: Secondary | ICD-10-CM | POA: Insufficient documentation

## 2012-03-05 DIAGNOSIS — R1013 Epigastric pain: Secondary | ICD-10-CM | POA: Insufficient documentation

## 2012-03-05 DIAGNOSIS — K2100 Gastro-esophageal reflux disease with esophagitis, without bleeding: Secondary | ICD-10-CM

## 2012-03-05 DIAGNOSIS — R110 Nausea: Secondary | ICD-10-CM

## 2012-03-05 DIAGNOSIS — K2940 Chronic atrophic gastritis without bleeding: Secondary | ICD-10-CM | POA: Insufficient documentation

## 2012-03-05 HISTORY — PX: ESOPHAGOGASTRODUODENOSCOPY: SHX5428

## 2012-03-05 SURGERY — EGD (ESOPHAGOGASTRODUODENOSCOPY)
Anesthesia: Moderate Sedation

## 2012-03-05 MED ORDER — BUTAMBEN-TETRACAINE-BENZOCAINE 2-2-14 % EX AERO
INHALATION_SPRAY | CUTANEOUS | Status: DC | PRN
  Administered 2012-03-05: 2 via TOPICAL

## 2012-03-05 MED ORDER — MIDAZOLAM HCL 5 MG/5ML IJ SOLN
INTRAMUSCULAR | Status: AC
  Filled 2012-03-05: qty 10

## 2012-03-05 MED ORDER — MEPERIDINE HCL 100 MG/ML IJ SOLN
INTRAMUSCULAR | Status: DC | PRN
  Administered 2012-03-05: 25 mg via INTRAVENOUS

## 2012-03-05 MED ORDER — STERILE WATER FOR IRRIGATION IR SOLN
Status: DC | PRN
  Administered 2012-03-05: 11:00:00

## 2012-03-05 MED ORDER — SODIUM CHLORIDE 0.45 % IV SOLN
Freq: Once | INTRAVENOUS | Status: AC
Start: 2012-03-05 — End: 2012-03-05
  Administered 2012-03-05: 1000 mL via INTRAVENOUS

## 2012-03-05 MED ORDER — PROMETHAZINE HCL 25 MG/ML IJ SOLN
25.0000 mg | Freq: Once | INTRAMUSCULAR | Status: AC
  Administered 2012-03-05: 25 mg via INTRAVENOUS

## 2012-03-05 MED ORDER — MIDAZOLAM HCL 5 MG/5ML IJ SOLN
INTRAMUSCULAR | Status: DC | PRN
  Administered 2012-03-05 (×2): 1 mg via INTRAVENOUS

## 2012-03-05 MED ORDER — MEPERIDINE HCL 100 MG/ML IJ SOLN
INTRAMUSCULAR | Status: AC
  Filled 2012-03-05: qty 2

## 2012-03-05 MED ORDER — PROMETHAZINE HCL 25 MG/ML IJ SOLN
INTRAMUSCULAR | Status: AC
  Administered 2012-03-05: 25 mg via INTRAVENOUS
  Filled 2012-03-05: qty 1

## 2012-03-05 MED ORDER — SODIUM CHLORIDE 0.9 % IJ SOLN
INTRAMUSCULAR | Status: AC
  Filled 2012-03-05: qty 10

## 2012-03-05 NOTE — H&P (View-Only) (Signed)
Primary Care Physician:  Selinda Flavin, MD, MD  Primary Gastroenterologist:  Roetta Sessions, MD   Chief Complaint  Patient presents with  . Abdominal Pain    HPI:  [name] is a 46 y.o. male here for further evaluation of chronic abdominal pain. He was last seen back in January 2011. At that time he had a several month history of him he'll call pain. In January 2011 he was diagnosed with acute pancreatitis that [facility]. He did have a CT of the abdomen pelvis at that time which was unremarkable except for stable T10 compression fracture. He was offered EGD and colonoscopy but he ended up incarcerated so the procedures were postponed.  While he was incarcerated, he finally convinced them to perform a colonoscopy. He states after he drank his prep he had abdominal pain and was found out that he had perforated diverticulitis. 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 This was in December 2011. In August 2012 he underwent a colonoscopy via stoma with endoscopy of Hartmann pouch for preoperative assessment for colostomy takedown. He had 2 four to six mm polyps in the transverse colon with complete resection with only partial retrieval. Path revealed adenomatous polyp. He also congested mucosa in the area 3 cm proximal to the stoma, erythematous because of Hartmann pouch.   In September 2012 he had exploratory laparotomy with colostomy takedown, mobilization of splenic and hepatic flexure, repair of rectal laceration, lysis of adhesions (greater than 3 hours), loop ileostomy. This procedure was complicated by abscess formation which required percutaneous drainage. I do not have the records, but the patient states that he underwent reversal of ileostomy in February of this year.  Patient complains of chronic periumbilical abdominal pain. Tolerates during the day with tramadol but every night he wakes up at 2am with sharp, persistent abdominal pain.  Used to be on oxycodone around the clock before Dr. Felecia Shelling gives him tramadol now. Used to be followed at pain clinic at [facility] but he has no way to get there now. [facility] will not see him (per patient "they don't handle abdominal pain".  Some pp nausea and abd pain. Pain periumbilical , sharp pain, stays for hours. Heartburn controlled on ranitidine. No dysphagia. Stools are real black last couple of days. Iron pills for one week. No rectal bleeding.   Current Outpatient Prescriptions  Medication Sig Dispense Refill  . ALPRAZolam (XANAX) 1 MG tablet Take 1 mg by mouth at bedtime as needed.      . cyclobenzaprine (FLEXERIL) 5 MG tablet Take 5 mg by mouth 3 (three) times daily as needed.      . ferrous sulfate 325 (65 FE) MG tablet Take 325 mg by mouth 2 (two) times daily.      Marland Kitchen gabapentin (NEURONTIN) 300 MG capsule Take 300 mg by mouth 3 (three) times daily.      Marland Kitchen PARoxetine (PAXIL) 20 MG tablet Take 20 mg by mouth every morning.      . ranitidine (ZANTAC) 150 MG capsule Take 150 mg by mouth 2 (two) times daily.      . simvastatin (ZOCOR) 20 MG tablet Take 20 mg by mouth daily.      . sodium bicarbonate 650 MG tablet Take 650 mg by mouth 2 (two) times daily.      . traMADol (ULTRAM) 50 MG tablet Take 50 mg by mouth every 6 (six) hours as needed.        Allergies as of  02/10/2012 - Review Complete 02/10/2012  Allergen Reaction Noted  . Codeine Itching   . Morphine Hives    Past Medical History  Diagnosis Date  . Acute pancreatitis   . Cannabis abuse   . Anxiety   . Hypertension   . Diverticulitis of colon with perforation 08/2010  . Renal insufficiency     follwed by Dr. Fausto Skillern   Past Surgical History  Procedure Date  . Appendectomy   . Umbilical hernia repair   . Right knee surgery   . 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 complicated diverticulitis  . Colonoscopy via stoma with endoscopy of hartmann pouch 04/2011    [facility]. two  sessile polyps in trv colon. complete resection but only partial retrieval. congested mucosa in area 3cm proximal to stoma, erythematous mucosa in Hartmann pouch. repeat tcs 8/2017path-adenomatous polyp  . Colon surgery 05/2011    [facility]. exp laparotomy with colostomy takedown, lysis of adhesions (3hours), loop ileostomy. complicated by abscess formation requiring percutaneous drainage  . Reversal of colostomy 10/2011    per patient, did not have report   Family History  Problem Relation Age of Onset  . Stomach cancer Father   . Lung cancer Father   . Throat cancer Father   . Colon cancer Neg Hx   . Liver disease Cousin     cirrhosis, etoh    History   Social History  . Marital Status: Single    Spouse Name: N/A    Number of Children: N/A  . Years of Education: N/A   Occupational History  . Not on file.   Social History Main Topics  . Smoking status: Current Everyday Smoker -- 1.0 packs/day    Types: Cigarettes  . Smokeless tobacco: Not on file  . Alcohol Use: No     former user quit in 2011  . Drug Use: Yes    Special: Marijuana     last time was a week ago  . Sexually Active: Not on file   Other Topics Concern  . Not on file   Social History Narrative  . No narrative on file      ROS:  General: Negative for anorexia, weight loss, fever, chills, fatigue, weakness. Eyes: Negative for vision changes.  ENT: Negative for hoarseness, difficulty swallowing , nasal congestion. To have teeth pulled this month CV: Negative for chest pain, angina, palpitations, dyspnea on exertion, peripheral edema.  Respiratory: Negative for dyspnea at rest, dyspnea on exertion, cough, sputum, wheezing.  GI: See history of present illness. GU:  Negative for dysuria, hematuria, urinary incontinence, urinary frequency, nocturnal urination.  MS: Negative for joint pain, low back pain.  Derm: Negative for rash or itching.  Neuro: Negative for weakness, abnormal sensation, seizure, frequent  headaches, memory loss, confusion.  Psych: c/o anxiety. no depression, suicidal ideation, hallucinations.  Endo: Negative for unusual weight change.  Heme: Negative for bruising or bleeding. Allergy: Negative for rash or hives.    Physical Examination:  BP 148/104  Pulse 80  Temp(Src) 97.1 F (36.2 C) (Temporal)  Ht 5\' 6"  (1.676 m)  Wt 190 lb 6.4 oz (86.365 kg)  BMI 30.73 kg/m2   General: Well-nourished, well-developed in no acute distress.  Head: Normocephalic, atraumatic.   Eyes: Conjunctiva pink, no icterus. Mouth: Oropharyngeal mucosa moist and pink , no lesions erythema or exudate. Teeth in poor repair. Neck: Supple without thyromegaly, masses, or lymphadenopathy.  Lungs: Clear to auscultation bilaterally.  Heart: Regular rate and  rhythm, no murmurs rubs or gallops.  Abdomen: Bowel sounds are normal, diffuse tenderness especially in midline and epigastrium, nondistended, no hepatosplenomegaly or masses, no abdominal bruits or    hernia , no rebound or guarding.  Extensive scarring in midline, two ostomy sites.  Rectal: not performed Extremities: No lower extremity edema. No clubbing or deformities.  Neuro: Alert and oriented x 4 , grossly normal neurologically.  Skin: Warm and dry, no rash or jaundice.   Psych: Alert and cooperative, normal mood and affect.

## 2012-03-05 NOTE — Op Note (Signed)
[facility] [address] [place], [place]  [address]  ENDOSCOPY PROCEDURE REPORT  PATIENT:  [name], [name]  MR#:  [id number] BIRTHDATE:  10-14-1965, 45 yrs. old  GENDER:  male  ENDOSCOPIST:  R. Roetta Sessions, MD Caleen Essex Referred by:  Selinda Flavin, M.D.  PROCEDURE DATE:  03/05/2012 PROCEDURE:  EGD with gastric biopsy  INDICATIONS:        Epigastric pain  INFORMED CONSENT:   The risks, benefits, limitations, alternatives and imponderables have been discussed.  The potential for biopsy, esophogeal dilation, etc. have also been reviewed.  Questions have been answered.  All parties agreeable.  Please see the history and physical in the medical record for more information.  MEDICATIONS: Versed 2 mg IV and Demerol 25 mg IV in divided doses. Cetacaine spray Phenergan 25 mg IV to augment conscious sedation  DESCRIPTION OF PROCEDURE:   The AO-[id number] ([id number]) endoscope was introduced through the mouth and advanced to the second portion of the duodenum without difficulty or limitations.  The mucosal surfaces were surveyed very carefully during advancement of the scope and upon withdrawal.  Retroflexion view of the proximal stomach and esophagogastric junction was performed.  <<PROCEDUREIMAGES>>  FINDINGS:  Inlet patch noted. Distal esophageal erosions extending 1 cm into the distal esophagus from the EG junction. No Barrett's esophagus. Stomach emptying. Diffuse gastric erosions and submucosal petechiae no ulcer or infiltrate process. Pylorus patent. Extensive duodenal bulbar erosions without ulcer or other abnormality. Otherwise, the first and second portion of the duodenum appeared normal.  THERAPEUTIC / DIAGNOSTIC MANEUVERS PERFORMED:  COMPLICATIONS:   None  IMPRESSION:  Erosive reflux esophagitis. Diffusely abnormal gastric mucosa-biopsies taken to rule out H. pylori; Bulbar erosions. Inlet patch.  RECOMMENDATIONS:  Begin Dexilant 60 mg orally daily.  GERD information provided. Followup on pathology. Further recommendations to                     follow.  ______________________________ R. Roetta Sessions, MD Caleen Essex  CC:  n. eSIGNED:   R. Roetta Sessions at 03/05/2012 11:46 AM  Darl Pikes, [phone]

## 2012-03-05 NOTE — Discharge Instructions (Addendum)
EGD Discharge instructions Please read the instructions outlined below and refer to this sheet in the next few weeks. These discharge instructions provide you with general information on caring for yourself after you leave the hospital. Your doctor may also give you specific instructions. While your treatment has been planned according to the most current medical practices available, unavoidable complications occasionally occur. If you have any problems or questions after discharge, please call your doctor. ACTIVITY  You may resume your regular activity but move at a slower pace for the next 24 hours.   Take frequent rest periods for the next 24 hours.   Walking will help expel (get rid of) the air and reduce the bloated feeling in your abdomen.   No driving for 24 hours (because of the anesthesia (medicine) used during the test).   You may shower.   Do not sign any important legal documents or operate any machinery for 24 hours (because of the anesthesia used during the test).  NUTRITION  Drink plenty of fluids.   You may resume your normal diet.   Begin with a light meal and progress to your normal diet.   Avoid alcoholic beverages for 24 hours or as instructed by your caregiver.  MEDICATIONS  You may resume your normal medications unless your caregiver tells you otherwise.  WHAT YOU CAN EXPECT TODAY  You may experience abdominal discomfort such as a feeling of fullness or "gas" pains.  FOLLOW-UP  Your doctor will discuss the results of your test with you.  SEEK IMMEDIATE MEDICAL ATTENTION IF ANY OF THE FOLLOWING OCCUR:  Excessive nausea (feeling sick to your stomach) and/or vomiting.   Severe abdominal pain and distention (swelling).   Trouble swallowing.   Temperature over 101 F (37.8 C).   Rectal bleeding or vomiting of blood.   GERD information provided  Begin Dexilant 60 mg orally daily  Further recommendations to follow. Gastroesophageal Reflux Disease,  Adult Gastroesophageal reflux disease (GERD) happens when acid from your stomach flows up into the esophagus. When acid comes in contact with the esophagus, the acid causes soreness (inflammation) in the esophagus. Over time, GERD may create small holes (ulcers) in the lining of the esophagus. CAUSES   Increased body weight. This puts pressure on the stomach, making acid rise from the stomach into the esophagus.   Smoking. This increases acid production in the stomach.   Drinking alcohol. This causes decreased pressure in the lower esophageal sphincter (valve or ring of muscle between the esophagus and stomach), allowing acid from the stomach into the esophagus.   Late evening meals and a full stomach. This increases pressure and acid production in the stomach.   A malformed lower esophageal sphincter.  Sometimes, no cause is found. SYMPTOMS   Burning pain in the lower part of the mid-chest behind the breastbone and in the mid-stomach area. This may occur twice a week or more often.   Trouble swallowing.   Sore throat.   Dry cough.   Asthma-like symptoms including chest tightness, shortness of breath, or wheezing.  DIAGNOSIS  Your caregiver may be able to diagnose GERD based on your symptoms. In some cases, X-rays and other tests may be done to check for complications or to check the condition of your stomach and esophagus. TREATMENT  Your caregiver may recommend over-the-counter or prescription medicines to help decrease acid production. Ask your caregiver before starting or adding any new medicines.  HOME CARE INSTRUCTIONS   Change the factors that you can control.  Ask your caregiver for guidance concerning weight loss, quitting smoking, and alcohol consumption.   Avoid foods and drinks that make your symptoms worse, such as:   Caffeine or alcoholic drinks.   Chocolate.   Peppermint or mint flavorings.   Garlic and onions.   Spicy foods.   Citrus fruits, such as  oranges, lemons, or limes.   Tomato-based foods such as sauce, chili, salsa, and pizza.   Fried and fatty foods.   Avoid lying down for the 3 hours prior to your bedtime or prior to taking a nap.   Eat small, frequent meals instead of large meals.   Wear loose-fitting clothing. Do not wear anything tight around your waist that causes pressure on your stomach.   Raise the head of your bed 6 to 8 inches with wood blocks to help you sleep. Extra pillows will not help.   Only take over-the-counter or prescription medicines for pain, discomfort, or fever as directed by your caregiver.   Do not take aspirin, ibuprofen, or other nonsteroidal anti-inflammatory drugs (NSAIDs).  SEEK IMMEDIATE MEDICAL CARE IF:   You have pain in your arms, neck, jaw, teeth, or back.   Your pain increases or changes in intensity or duration.   You develop nausea, vomiting, or sweating (diaphoresis).   You develop shortness of breath, or you faint.   Your vomit is green, yellow, black, or looks like coffee grounds or blood.   Your stool is red, bloody, or black.  These symptoms could be signs of other problems, such as heart disease, gastric bleeding, or esophageal bleeding. MAKE SURE YOU:   Understand these instructions.   Will watch your condition.   Will get help right away if you are not doing well or get worse.  Document Released: 06/04/2005 Document Revised: 08/14/2011 Document Reviewed: 03/14/2011 [facility] Patient Information 2012 [place], [place].

## 2012-03-05 NOTE — Interval H&P Note (Signed)
History and Physical Interval Note:  03/05/2012 11:25 AM  [name]  has presented today for surgery, with the diagnosis of nausea and epigastric pain  The various methods of treatment have been discussed with the patient and family. After consideration of risks, benefits and other options for treatment, the patient has consented to  Procedure(s) (LRB): ESOPHAGOGASTRODUODENOSCOPY (EGD) (N/A) as a surgical intervention .  The patient's history has been reviewed, patient examined, no change in status, stable for surgery.  I have reviewed the patients' chart and labs.  Questions were answered to the patient's satisfaction.     [name]

## 2012-03-08 ENCOUNTER — Emergency Department (HOSPITAL_COMMUNITY): Payer: Medicaid Other

## 2012-03-08 ENCOUNTER — Telehealth: Payer: Self-pay

## 2012-03-08 ENCOUNTER — Emergency Department (HOSPITAL_COMMUNITY)
Admission: EM | Admit: 2012-03-08 | Discharge: 2012-03-08 | Disposition: A | Discharge status: Home / Self Care | Payer: Medicaid Other | Attending: Emergency Medicine | Admitting: Emergency Medicine

## 2012-03-08 ENCOUNTER — Encounter (HOSPITAL_COMMUNITY): Payer: Self-pay | Admitting: *Deleted

## 2012-03-08 DIAGNOSIS — R079 Chest pain, unspecified: Secondary | ICD-10-CM | POA: Insufficient documentation

## 2012-03-08 DIAGNOSIS — Z933 Colostomy status: Secondary | ICD-10-CM | POA: Insufficient documentation

## 2012-03-08 DIAGNOSIS — R10819 Abdominal tenderness, unspecified site: Secondary | ICD-10-CM | POA: Insufficient documentation

## 2012-03-08 DIAGNOSIS — G8929 Other chronic pain: Secondary | ICD-10-CM

## 2012-03-08 DIAGNOSIS — R109 Unspecified abdominal pain: Secondary | ICD-10-CM

## 2012-03-08 DIAGNOSIS — R51 Headache: Secondary | ICD-10-CM | POA: Insufficient documentation

## 2012-03-08 LAB — URINALYSIS, ROUTINE W REFLEX MICROSCOPIC
Glucose, UA: NEGATIVE mg/dL
Hgb urine dipstick: NEGATIVE
Leukocytes, UA: NEGATIVE
pH: 6 (ref 5.0–8.0)

## 2012-03-08 LAB — CBC WITH DIFFERENTIAL/PLATELET
Basophils Absolute: 0 10*3/uL (ref 0.0–0.1)
Basophils Relative: 0 % (ref 0–1)
Eosinophils Relative: 0 % (ref 0–5)
HCT: 44.2 % (ref 39.0–52.0)
Lymphocytes Relative: 24 % (ref 12–46)
MCHC: 34.2 g/dL (ref 30.0–36.0)
Monocytes Absolute: 0.6 10*3/uL (ref 0.1–1.0)
Neutro Abs: 6.7 10*3/uL (ref 1.7–7.7)
Platelets: 258 10*3/uL (ref 150–400)
RDW: 14.1 % (ref 11.5–15.5)
WBC: 9.7 10*3/uL (ref 4.0–10.5)

## 2012-03-08 LAB — COMPREHENSIVE METABOLIC PANEL
ALT: 12 U/L (ref 0–53)
AST: 16 U/L (ref 0–37)
Albumin: 3.8 g/dL (ref 3.5–5.2)
CO2: 26 mEq/L (ref 19–32)
Calcium: 9.6 mg/dL (ref 8.4–10.5)
Chloride: 97 mEq/L (ref 96–112)
Creatinine, Ser: 1.26 mg/dL (ref 0.50–1.35)
Sodium: 135 mEq/L (ref 135–145)

## 2012-03-08 MED ORDER — OXYCODONE-ACETAMINOPHEN 5-325 MG PO TABS: 1.0000 | ORAL_TABLET | ORAL | Status: DC | PRN

## 2012-03-08 MED ORDER — SODIUM CHLORIDE 0.9 % IV BOLUS (SEPSIS)
1000.0000 mL | Freq: Once | INTRAVENOUS | Status: AC
  Administered 2012-03-08: 1000 mL via INTRAVENOUS

## 2012-03-08 MED ORDER — HYDROMORPHONE HCL PF 1 MG/ML IJ SOLN
1.0000 mg | Freq: Once | INTRAMUSCULAR | Status: AC
  Administered 2012-03-08: 1 mg via INTRAVENOUS

## 2012-03-08 MED ORDER — PROMETHAZINE HCL 25 MG PO TABS: 25.0000 mg | ORAL_TABLET | Freq: Four times a day (QID) | ORAL | Status: DC | PRN

## 2012-03-08 MED ORDER — PANTOPRAZOLE SODIUM 40 MG PO TBEC: 40.0000 mg | DELAYED_RELEASE_TABLET | Freq: Every day | ORAL | Status: DC

## 2012-03-08 MED ORDER — HYDROMORPHONE HCL PF 1 MG/ML IJ SOLN
1.0000 mg | Freq: Once | INTRAMUSCULAR | Status: AC
  Administered 2012-03-08: 1 mg via INTRAVENOUS
  Filled 2012-03-08: qty 1

## 2012-03-08 MED ORDER — ONDANSETRON HCL 4 MG/2ML IJ SOLN
4.0000 mg | Freq: Once | INTRAMUSCULAR | Status: AC
  Administered 2012-03-08: 4 mg via INTRAVENOUS
  Filled 2012-03-08: qty 2

## 2012-03-08 MED ORDER — SODIUM CHLORIDE 0.9 % IV SOLN: INTRAVENOUS | Status: DC

## 2012-03-08 MED ORDER — IOHEXOL 300 MG/ML  SOLN
100.0000 mL | Freq: Once | INTRAMUSCULAR | Status: AC | PRN
  Administered 2012-03-08: 100 mL via INTRAVENOUS

## 2012-03-08 MED ORDER — HYDROMORPHONE HCL PF 1 MG/ML IJ SOLN
INTRAMUSCULAR | Status: AC
  Filled 2012-03-08: qty 1

## 2012-03-08 NOTE — Telephone Encounter (Signed)
Please address w/ Dr Jena Gauss today.  He just did EGD on this patient.  I have not seen pt recently. Thanks

## 2012-03-08 NOTE — Discharge Instructions (Signed)
followup with your GI Dr. And your primary care Dr. In the next few days. Return for new or worse symptoms take pain medicine as directed.

## 2012-03-08 NOTE — Telephone Encounter (Signed)
Pt is aware of trial of pantoprazole.

## 2012-03-08 NOTE — ED Provider Notes (Signed)
History   This chart was scribed for Shelda Jakes, MD by Melba Coon. The patient was seen in room APA03/APA03 and the patient's care was started at 5:43PM.    CSN: [id number]  Arrival date & time 03/08/12  1719   First MD Initiated Contact with Patient 03/08/12 1731      Chief Complaint  Patient presents with  . Abdominal Pain    (Consider location/radiation/quality/duration/timing/severity/associated sxs/prior treatment) HPI [name] is a 46 y.o. male who presents to the Emergency Department complaining of constant, moderate to severe abdominal pain with associated nausea and emesis with an onset 3 hrs ago. Pt ate a sandwich, became nauseous, vomit x1, took phenergan, nausea slightly alleviated but abd pain started to become present that radiates to upper chest. Pt rates the severity of the pain 10/10. Hx of pancreatitis. Hx of chronic abd pain. Back pain, HA and dizziness present. No fever, neck pain, sore throat, rash, CP, SOB, diarrhea, dysuria, or extremity pain, edema, weakness, numbness, or tingling. Allergic to codeine and morphine. No other pertinent medical symptoms.  PCP: Dr. Felecia Shelling GI: Dr. Ellison Carwin  Past Medical History  Diagnosis Date  . Acute pancreatitis   . Cannabis abuse   . Anxiety   . Diverticulitis of colon with perforation 08/2010  . GERD (gastroesophageal reflux disease)   . Hypertension     taken off lisinopril by renal md 08/2011  . Complication of anesthesia     talking out of my head  . Renal insufficiency     follwed by Dr. Fausto Skillern    Past Surgical History  Procedure Date  . Appendectomy   . Umbilical hernia repair   . Right knee surgery   . 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 complicated diverticulitis  . Colonoscopy via stoma with endoscopy of hartmann pouch 04/2011    [facility]. two sessile polyps in trv colon. complete resection but only partial retrieval. congested mucosa in area 3cm proximal  to stoma, erythematous mucosa in Hartmann pouch. repeat tcs 8/2017path-adenomatous polyp  . Colon surgery 05/2011    [facility]. exp laparotomy with colostomy takedown, lysis of adhesions (3hours), loop ileostomy. complicated by abscess formation requiring percutaneous drainage  . Reversal of colostomy 10/2011    per patient, did not have report  . Multiple extractions with alveoloplasty 02/23/2012    Procedure: MULTIPLE EXTRACION WITH ALVEOLOPLASTY;  Surgeon: Georgia Lopes, DDS;  Location: [facility] OR;  Service: Oral Surgery;  Laterality: Bilateral;  Removal of left mandibular torus, multiple extractions with alveoloplasty    Family History  Problem Relation Age of Onset  . Stomach cancer Father   . Lung cancer Father   . Throat cancer Father   . Colon cancer Neg Hx   . Liver disease Cousin     cirrhosis, etoh    History  Substance Use Topics  . Smoking status: Current Everyday Smoker -- 1.0 packs/day    Types: Cigarettes  . Smokeless tobacco: Not on file  . Alcohol Use: No     former user quit in 2011      Review of Systems 10 Systems reviewed and all are negative for acute change except as noted in the HPI.   Allergies  Codeine; Morphine; and Adhesive  Home Medications   Current Outpatient Rx  Name Route Sig Dispense Refill  . ALPRAZOLAM 1 MG PO TABS Oral Take 1 mg by mouth 4 (four) times daily.     . CYCLOBENZAPRINE HCL 5  MG PO TABS Oral Take 5 mg by mouth at bedtime as needed.     Marland Kitchen DOCUSATE SODIUM 100 MG PO CAPS Oral Take 100 mg by mouth daily as needed. For constipation    . FERROUS SULFATE 325 (65 FE) MG PO TABS Oral Take 325 mg by mouth 2 (two) times daily.    . OXYCODONE-ACETAMINOPHEN 5-325 MG PO TABS Oral Take 1 tablet by mouth as needed. For pain    . PANTOPRAZOLE SODIUM 40 MG PO TBEC Oral Take 1 tablet (40 mg total) by mouth daily. 31 tablet 5  . PAROXETINE HCL 20 MG PO TABS Oral Take 20 mg by mouth 2 (two) times daily.     Marland Kitchen RANITIDINE HCL 300 MG PO TABS Oral Take  300 mg by mouth Twice daily.    . SODIUM BICARBONATE 650 MG PO TABS Oral Take 650 mg by mouth 2 (two) times daily.    . OXYCODONE-ACETAMINOPHEN 5-325 MG PO TABS Oral Take 1-2 tablets by mouth every 4 (four) hours as needed for pain. 15 tablet 0  . PROMETHAZINE HCL 25 MG PO TABS Oral Take 1 tablet (25 mg total) by mouth every 6 (six) hours as needed for nausea. 12 tablet 0    BP 160/112  Pulse 81  Temp 98 F (36.7 C) (Oral)  Resp 18  Ht 5\' 7"  (1.702 m)  Wt 190 lb (86.183 kg)  BMI 29.76 kg/m2  SpO2 99%  Physical Exam  Nursing note and vitals reviewed. Constitutional: He is oriented to person, place, and time. He appears well-developed and well-nourished. No distress.  HENT:  Head: Normocephalic and atraumatic.  Right Ear: External ear normal.  Left Ear: External ear normal.  Eyes: EOM are normal.  Neck: Normal range of motion. No tracheal deviation present.  Cardiovascular: Normal rate, regular rhythm and normal heart sounds.   No murmur heard. Pulmonary/Chest: Effort normal. No respiratory distress.  Abdominal: Soft. There is tenderness (Diffuse).       Decreased bowel sounds.  Musculoskeletal: Normal range of motion. He exhibits no edema and no tenderness.  Lymphadenopathy:    He has no cervical adenopathy.  Neurological: He is alert and oriented to person, place, and time. No cranial nerve deficit.  Skin: Skin is warm and dry. No rash noted.  Psychiatric: He has a normal mood and affect. His behavior is normal.    ED Course  Procedures (including critical care time)  DIAGNOSTIC STUDIES: Oxygen Saturation is 99% on room air, normal by my interpretation.    COORDINATION OF CARE:  5:47PM - EDMD will order IV fluids, zofran, dilaudid, CXR, abd CT, blood w/u, and UA for the pt.  Results for orders placed during the hospital encounter of 03/08/12  CBC WITH DIFFERENTIAL      Component Value Range   WBC 9.7  4.0 - 10.5 K/uL   RBC 4.98  4.22 - 5.81 MIL/uL   Hemoglobin  15.1  13.0 - 17.0 g/dL   HCT 11.9  14.7 - 82.9 %   MCV 88.8  78.0 - 100.0 fL   MCH 30.3  26.0 - 34.0 pg   MCHC 34.2  30.0 - 36.0 g/dL   RDW 56.2  13.0 - 86.5 %   Platelets 258  150 - 400 K/uL   Neutrophils Relative 69  43 - 77 %   Neutro Abs 6.7  1.7 - 7.7 K/uL   Lymphocytes Relative 24  12 - 46 %   Lymphs Abs 2.4  0.7 -  4.0 K/uL   Monocytes Relative 6  3 - 12 %   Monocytes Absolute 0.6  0.1 - 1.0 K/uL   Eosinophils Relative 0  0 - 5 %   Eosinophils Absolute 0.0  0.0 - 0.7 K/uL   Basophils Relative 0  0 - 1 %   Basophils Absolute 0.0  0.0 - 0.1 K/uL  COMPREHENSIVE METABOLIC PANEL      Component Value Range   Sodium 135  135 - 145 mEq/L   Potassium 3.4 (*) 3.5 - 5.1 mEq/L   Chloride 97  96 - 112 mEq/L   CO2 26  19 - 32 mEq/L   Glucose, Bld 94  70 - 99 mg/dL   BUN 16  6 - 23 mg/dL   Creatinine, Ser 1.61  0.50 - 1.35 mg/dL   Calcium 9.6  8.4 - 09.6 mg/dL   Total Protein 7.7  6.0 - 8.3 g/dL   Albumin 3.8  3.5 - 5.2 g/dL   AST 16  0 - 37 U/L   ALT 12  0 - 53 U/L   Alkaline Phosphatase 83  39 - 117 U/L   Total Bilirubin 0.2 (*) 0.3 - 1.2 mg/dL   GFR calc non Af Amer 67 (*) >90 mL/min   GFR calc Af Amer 78 (*) >90 mL/min  LIPASE, BLOOD      Component Value Range   Lipase 32  11 - 59 U/L  URINALYSIS, ROUTINE W REFLEX MICROSCOPIC      Component Value Range   Color, Urine AMBER (*) YELLOW   APPearance CLEAR  CLEAR   Specific Gravity, Urine 1.015  1.005 - 1.030   pH 6.0  5.0 - 8.0   Glucose, UA NEGATIVE  NEGATIVE mg/dL   Hgb urine dipstick NEGATIVE  NEGATIVE   Bilirubin Urine NEGATIVE  NEGATIVE   Ketones, ur NEGATIVE  NEGATIVE mg/dL   Protein, ur NEGATIVE  NEGATIVE mg/dL   Urobilinogen, UA 0.2  0.0 - 1.0 mg/dL   Nitrite NEGATIVE  NEGATIVE   Leukocytes, UA NEGATIVE  NEGATIVE    Date: 03/08/2012  Rate: 71  Rhythm: normal sinus rhythm  QRS Axis: normal  Intervals: normal  ST/T Wave abnormalities: normal  Conduction Disutrbances:none  Narrative Interpretation:   Old EKG  Reviewed: none available   Dg Chest 2 View  03/08/2012  *RADIOLOGY REPORT*  Clinical Data: Chest pain, headache.  CHEST - 2 VIEW  Comparison: 02/23/2012,  03/21/2007  Findings: Nodular density in the left lung base, not definitively seen on prior study.  This was noted on more remote studies from 2008 and not significantly changed, most likely scarring.  Right lung is clear.  Heart is normal size.  No effusions or acute bony abnormality.  IMPRESSION: Lingular scar.  No acute findings.  Original Report Authenticated By: Cyndie Chime, M.D.   Ct Abdomen Pelvis W Contrast  03/08/2012  *RADIOLOGY REPORT*  Clinical Data: Abdominal pain  CT ABDOMEN AND PELVIS WITH CONTRAST  Technique:  Multidetector CT imaging of the abdomen and pelvis was performed following the standard protocol during bolus administration of intravenous contrast.  Contrast: OMNIPAQUE IOHEXOL 300 MG/ML  SOLN  Comparison: 10/05/2009  Findings: The lung bases appear clear.  No pericardial or pleural effusion.  There is no focal liver abnormality.  The gallbladder appears normal.  No biliary dilatation.  Normal appearance of the pancreas. The spleen is normal.  Both adrenal glands are within normal limits.  Normal appearance of the right kidney.  The  left kidney is also normal.  No evidence for obstructive uropathy.  The urinary bladder appears within normal limits.  There is no upper abdominal adenopathy.  No pelvic or inguinal adenopathy.  The stomach appears normal and is distended with enteric contrast material.  The small bowel loops have a normal caliber.  Evidence of prior small bowel resection with enteroenteric anastomoses in the right lower quadrant is identified.  Previous ileostomy site is noted within the right lower quadrant of the abdomen.  There is hernia mesh identified along the undersurface of the ventral abdominal wall.  A left hemicolectomy has been performed. Previous colostomy site is noted within the left lower quadrant  ventral abdominal wall, image number 56.  The colorectal anastomosis within the central pelvis has an uncomplicated appearance.  No free fluid or fluid collections within the abdomen or pelvis.  Review of the visualized osseous structures is unremarkable.  IMPRESSION:  1.  No acute findings identified within the abdomen or pelvis. 2.  Evidence of prior small bowel resection and left hemicolectomy. At this time there are no specific features to suggest bowel obstruction or abscess formation.  Original Report Authenticated By: Rosealee Albee, M.D.     1. Abdominal pain   2. Chronic abdominal pain       MDM  Patient with history of chronic abdominal pain followed by GI medicine and by primary care medicine. Recently treated with Percocet based on the prescription the that would've run out on June 30 patient's workup here today negative no evidence of acute abdominal process lipase is not elevated chest x-ray is negative for pneumonia pneumothorax EKG shows no acute changes liver function tests without significant abnormalities no leukocytosis. Patient stable for discharge home and followup  I personally performed the services described in this documentation, which was scribed in my presence. The recorded information has been reviewed and considered.         Shelda Jakes, MD 03/08/12 2016

## 2012-03-08 NOTE — Telephone Encounter (Signed)
Pt called back stating that he is dizzy after eating a sandwich. He wanted to know what to do. I told him that we have sent a message to [facility] and have not heard anything yet. If you are in that must pain you should go to the ER to be checked out. He is also wanting some pain medication.

## 2012-03-08 NOTE — Telephone Encounter (Signed)
Routing to [facility]. 

## 2012-03-08 NOTE — ED Notes (Signed)
abd pain , nausea, vomited x1,  No diarrhea.  Pain radiates up into chest, Headache.  Onset after eating a sandwich.

## 2012-03-08 NOTE — ED Notes (Signed)
Sitting up on stretcher calmly in no distress; reports 6/10 HA pain and 4/10 abd pain; requesting something to drink; skin warm and dry. Will notify EDP of c/o pain.

## 2012-03-08 NOTE — Telephone Encounter (Signed)
Gastroenterology Phone Call Form  Primary Physician:   1) Reason for phone call: abd pain   2) When did this start? 03/07/12- Sunday   3) Have you been seen for this here before?  yes   4) If Pain, where is it located?  Middle of stomach   5) Rate your pain/discomfort on scale 1-10? 7   6) Have you tried any medication for this?tried to take percocet but it makes his stomach upset   7) Do you have any trouble breathing, chest pain, vomiting blood, passing bloody stools, feel dehydrated, dizzy or have a high fever?no

## 2012-03-08 NOTE — Telephone Encounter (Signed)
Please let pt know he will have trial pantoprazole. Thanks

## 2012-03-08 NOTE — ED Notes (Signed)
Alert, in no distress; instructions reviewed and f/u information provided; verbalizes understanding.

## 2012-03-08 NOTE — Telephone Encounter (Signed)
Received fax from [facility]- pts insurance will not cover Dexilant without a PA. Pt has Medicaid and their formulary is omeprazole and pantoprazole. Ginger spoke with pt and he has never tried either. He has only tried zantac. Pt informed he may have to try another medication.   Pt is also requesting something for pain.

## 2012-03-08 NOTE — Telephone Encounter (Signed)
Need more info regarding pain please.  Is this new pain since EGD or same old?  Please complete triage sheet. Pt had EGD by Dr Jena Gauss recently & has been seen by Verlon Au last month. Thanks

## 2012-03-09 ENCOUNTER — Emergency Department (HOSPITAL_COMMUNITY): Payer: Medicaid Other

## 2012-03-09 ENCOUNTER — Emergency Department (HOSPITAL_COMMUNITY)
Admission: EM | Admit: 2012-03-09 | Discharge: 2012-03-09 | Disposition: A | Discharge status: Home / Self Care | Payer: Medicaid Other | Attending: Emergency Medicine | Admitting: Emergency Medicine

## 2012-03-09 ENCOUNTER — Encounter (HOSPITAL_COMMUNITY): Payer: Self-pay | Admitting: *Deleted

## 2012-03-09 ENCOUNTER — Encounter: Payer: Self-pay | Admitting: Internal Medicine

## 2012-03-09 ENCOUNTER — Ambulatory Visit (INDEPENDENT_AMBULATORY_CARE_PROVIDER_SITE_OTHER): Payer: Medicaid Other | Admitting: Urgent Care

## 2012-03-09 ENCOUNTER — Encounter: Payer: Self-pay | Admitting: Urgent Care

## 2012-03-09 VITALS — BP 182/118 | HR 74 | Temp 98.2°F | Ht 67.0 in | Wt 190.6 lb

## 2012-03-09 DIAGNOSIS — R109 Unspecified abdominal pain: Secondary | ICD-10-CM

## 2012-03-09 DIAGNOSIS — I10 Essential (primary) hypertension: Secondary | ICD-10-CM | POA: Insufficient documentation

## 2012-03-09 DIAGNOSIS — R1084 Generalized abdominal pain: Secondary | ICD-10-CM | POA: Insufficient documentation

## 2012-03-09 DIAGNOSIS — G8929 Other chronic pain: Secondary | ICD-10-CM

## 2012-03-09 DIAGNOSIS — R51 Headache: Secondary | ICD-10-CM | POA: Insufficient documentation

## 2012-03-09 LAB — RAPID URINE DRUG SCREEN, HOSP PERFORMED
Amphetamines: NOT DETECTED
Benzodiazepines: POSITIVE — AB
Cocaine: NOT DETECTED
Opiates: NOT DETECTED
Tetrahydrocannabinol: POSITIVE — AB

## 2012-03-09 LAB — POCT I-STAT, CHEM 8
Calcium, Ion: 1.26 mmol/L (ref 1.12–1.32)
Creatinine, Ser: 1.1 mg/dL (ref 0.50–1.35)
Glucose, Bld: 112 mg/dL — ABNORMAL HIGH (ref 70–99)
Hemoglobin: 15.6 g/dL (ref 13.0–17.0)
TCO2: 24 mmol/L (ref 0–100)

## 2012-03-09 MED ORDER — AMLODIPINE BESYLATE 10 MG PO TABS: 5.0000 mg | ORAL_TABLET | Freq: Every day | ORAL | Status: DC

## 2012-03-09 MED ORDER — SODIUM CHLORIDE 0.9 % IV BOLUS (SEPSIS)
500.0000 mL | Freq: Once | INTRAVENOUS | Status: AC
  Administered 2012-03-09: 500 mL via INTRAVENOUS

## 2012-03-09 MED ORDER — SODIUM CHLORIDE 0.9 % IV SOLN
INTRAVENOUS | Status: DC
  Administered 2012-03-09: 15:00:00 via INTRAVENOUS

## 2012-03-09 MED ORDER — OXYCODONE-ACETAMINOPHEN 5-325 MG PO TABS
1.0000 | ORAL_TABLET | ORAL | Status: DC | PRN
  Administered 2012-03-09: 2 via ORAL
  Filled 2012-03-09: qty 2

## 2012-03-09 NOTE — Progress Notes (Signed)
Pt c/o severe HA & dizziness.  OV today was for abdominal pain.  Pt seen in ED yesterday w/ similar complaints. Ginger, CMA noted BP as above. Rechecked BP 182/118. Spoke w/ triage nurse at [facility] about pt's referral. Mother is w/ pt & agrees to transport directly to ED.  Offered EMS, declined. Will reevaluate abdominal pain once HTN & dizziness addressed.

## 2012-03-09 NOTE — Telephone Encounter (Signed)
Dizziness after eating a sandwich. This is coincident.  This would have nothing to do with his recent procedure. PCP see his PCP regarding the new symptom of dizziness

## 2012-03-09 NOTE — ED Notes (Signed)
Pt sent from Dr. Genia Del office for hypertension. Pt also c/o dizziness, headache and abdominal pain. Pt seen in ED yesterday for same.

## 2012-03-09 NOTE — Telephone Encounter (Signed)
Pt has ov with KJ

## 2012-03-09 NOTE — ED Provider Notes (Signed)
History     CSN: [id number]  Arrival date & time 03/09/12  1352   First MD Initiated Contact with Patient 03/09/12 1436      Chief Complaint  Patient presents with  . Hypertension    (Consider location/radiation/quality/duration/timing/severity/associated sxs/prior treatment) HPI [name] is a 46 y.o. male who presents to the Emergency Department complaining of stomach pain. Pt denies eating anything today 1 percocet Reports previous episodes of similar symptoms. He was evaluated in the emergency department yesterday, for abdominal pain. Diverticulitis in 2011, 3 surgeries since then. Dr. Benard Rink, saw patient today, and sent him here for evaluation of hypertension. He plans on reevaluating the patient after the blood pressure has been treated HTN was on medication, but taken off this December because of kidney failure (Dr. Fausto Skillern) Reports head pain, back pain, weakness. He denies dizziness, paresthesias, change in bowel or urine habits. Denies chest pain Smokes, drinks, occasional marijuana usage 141/102 BP @ 14:30  2:52pm  Past Medical History  Diagnosis Date  . Acute pancreatitis   . Cannabis abuse   . Anxiety   . Diverticulitis of colon with perforation 08/2010  . GERD (gastroesophageal reflux disease)   . Hypertension     taken off lisinopril by renal md 08/2011  . Complication of anesthesia     talking out of my head  . Renal insufficiency     follwed by Dr. Fausto Skillern    Past Surgical History  Procedure Date  . Appendectomy   . Umbilical hernia repair   . Right knee surgery   . 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 complicated diverticulitis  . Colonoscopy via stoma with endoscopy of hartmann pouch 04/2011    [facility]. two sessile polyps in trv colon. complete resection but only partial retrieval. congested mucosa in area 3cm proximal to stoma, erythematous mucosa in Hartmann pouch. repeat tcs 8/2017path-adenomatous polyp    . Colon surgery 05/2011    [facility]. exp laparotomy with colostomy takedown, lysis of adhesions (3hours), loop ileostomy. complicated by abscess formation requiring percutaneous drainage  . Reversal of colostomy 10/2011    per patient, did not have report  . Multiple extractions with alveoloplasty 02/23/2012    Procedure: MULTIPLE EXTRACION WITH ALVEOLOPLASTY;  Surgeon: Georgia Lopes, DDS;  Location: [facility] OR;  Service: Oral Surgery;  Laterality: Bilateral;  Removal of left mandibular torus, multiple extractions with alveoloplasty  . Esophagogastroduodenoscopy 03/05/2012    Procedure: ESOPHAGOGASTRODUODENOSCOPY (EGD);  Surgeon: Corbin Ade, MD;  Location: [facility];  Service: Endoscopy;  Laterality: N/A;  11:30    Family History  Problem Relation Age of Onset  . Stomach cancer Father   . Lung cancer Father   . Throat cancer Father   . Colon cancer Neg Hx   . Liver disease Cousin     cirrhosis, etoh    History  Substance Use Topics  . Smoking status: Current Everyday Smoker -- 1.0 packs/day    Types: Cigarettes  . Smokeless tobacco: Not on file  . Alcohol Use: No     former user quit in 2011      Review of Systems  Allergies  Codeine; Latex; Morphine; and Adhesive  Home Medications   Current Outpatient Rx  Name Route Sig Dispense Refill  . ALPRAZOLAM 1 MG PO TABS Oral Take 1 mg by mouth 4 (four) times daily as needed. Anxiety    . CYCLOBENZAPRINE HCL 5 MG PO TABS Oral Take 5 mg by mouth  at bedtime as needed. Muscle Spasms    . DOCUSATE SODIUM 100 MG PO CAPS Oral Take 100 mg by mouth daily as needed. For constipation    . FERROUS SULFATE 325 (65 FE) MG PO TABS Oral Take 325 mg by mouth 2 (two) times daily.    . OXYCODONE-ACETAMINOPHEN 5-325 MG PO TABS Oral Take 1-2 tablets by mouth every 4 (four) hours as needed for pain. 15 tablet 0  . PANTOPRAZOLE SODIUM 40 MG PO TBEC Oral Take 1 tablet (40 mg total) by mouth daily. 31 tablet 5  . PAROXETINE HCL 20 MG PO TABS Oral Take  20 mg by mouth 2 (two) times daily.     Marland Kitchen PROMETHAZINE HCL 25 MG PO TABS Oral Take 1 tablet (25 mg total) by mouth every 6 (six) hours as needed for nausea. 12 tablet 0  . SODIUM BICARBONATE 650 MG PO TABS Oral Take 650 mg by mouth 2 (two) times daily.    Marland Kitchen AMLODIPINE BESYLATE 10 MG PO TABS Oral Take 0.5 tablets (5 mg total) by mouth daily. 30 tablet 0    BP 174/112  Pulse 74  Temp 98 F (36.7 C) (Oral)  Resp 18  Ht 5\' 7"  (1.702 m)  Wt 190 lb (86.183 kg)  BMI 29.76 kg/m2  SpO2 96%  Physical Exam  ED Course  Procedures (including critical care time)  Labs Reviewed  URINE RAPID DRUG SCREEN (HOSP PERFORMED) - Abnormal; Notable for the following:    Benzodiazepines POSITIVE (*)     Tetrahydrocannabinol POSITIVE (*)     All other components within normal limits  POCT I-STAT, CHEM 8 - Abnormal; Notable for the following:    Glucose, Bld 112 (*)     All other components within normal limits   Dg Chest 2 View  03/08/2012  *RADIOLOGY REPORT*  Clinical Data: Chest pain, headache.  CHEST - 2 VIEW  Comparison: 02/23/2012,  03/21/2007  Findings: Nodular density in the left lung base, not definitively seen on prior study.  This was noted on more remote studies from 2008 and not significantly changed, most likely scarring.  Right lung is clear.  Heart is normal size.  No effusions or acute bony abnormality.  IMPRESSION: Lingular scar.  No acute findings.  Original Report Authenticated By: Cyndie Chime, M.D.   Ct Head Wo Contrast  03/09/2012  *RADIOLOGY REPORT*  Clinical Data: Headache  CT HEAD WITHOUT CONTRAST  Technique:  Contiguous axial images were obtained from the base of the skull through the vertex without contrast.  Comparison: 03/21/2007  Findings: No evidence of parenchymal hemorrhage or extra-axial fluid collection. No mass lesion, mass effect, or midline shift.  No CT evidence of acute infarction.  Cerebral volume is age appropriate.  No ventriculomegaly.  Intracranial atherosclerosis.   The visualized paranasal sinuses are essentially clear. The mastoid air cells are unopacified.  No evidence of calvarial fracture.  IMPRESSION: No evidence of acute intracranial abnormality.  Intracranial atherosclerosis.  Original Report Authenticated By: Charline Bills, M.D.   Ct Abdomen Pelvis W Contrast  03/08/2012  *RADIOLOGY REPORT*  Clinical Data: Abdominal pain  CT ABDOMEN AND PELVIS WITH CONTRAST  Technique:  Multidetector CT imaging of the abdomen and pelvis was performed following the standard protocol during bolus administration of intravenous contrast.  Contrast: OMNIPAQUE IOHEXOL 300 MG/ML  SOLN  Comparison: 10/05/2009  Findings: The lung bases appear clear.  No pericardial or pleural effusion.  There is no focal liver abnormality.  The gallbladder appears normal.  No biliary dilatation.  Normal appearance of the pancreas. The spleen is normal.  Both adrenal glands are within normal limits.  Normal appearance of the right kidney.  The left kidney is also normal.  No evidence for obstructive uropathy.  The urinary bladder appears within normal limits.  There is no upper abdominal adenopathy.  No pelvic or inguinal adenopathy.  The stomach appears normal and is distended with enteric contrast material.  The small bowel loops have a normal caliber.  Evidence of prior small bowel resection with enteroenteric anastomoses in the right lower quadrant is identified.  Previous ileostomy site is noted within the right lower quadrant of the abdomen.  There is hernia mesh identified along the undersurface of the ventral abdominal wall.  A left hemicolectomy has been performed. Previous colostomy site is noted within the left lower quadrant ventral abdominal wall, image number 56.  The colorectal anastomosis within the central pelvis has an uncomplicated appearance.  No free fluid or fluid collections within the abdomen or pelvis.  Review of the visualized osseous structures is unremarkable.  IMPRESSION:   1.  No acute findings identified within the abdomen or pelvis. 2.  Evidence of prior small bowel resection and left hemicolectomy. At this time there are no specific features to suggest bowel obstruction or abscess formation.  Original Report Authenticated By: Rosealee Albee, M.D.     1. Hypertension   2. Abdominal pain, chronic, generalized       MDM  Hypertension, persistent, prior treatment was stopped for renal insufficiency. His renal function is normal now. His abdominal pain is recurrent and essentially chronic. Further workup indicated.Doubt metabolic instability, serious bacterial infection or impending vascular collapse; the patient is stable for discharge.  Plan: Home Medications- Norvasc; Home Treatments- rest; Recommended follow up- f/u PCP and GI   I personally performed the services described in this documentation, which was scribed in my presence. The recorded information has been reviewed and considered.    Flint Melter, MD 03/09/12 [phone]

## 2012-03-10 ENCOUNTER — Encounter: Payer: Self-pay | Admitting: Internal Medicine

## 2012-03-10 NOTE — Telephone Encounter (Signed)
Please call pt to reschedule OV w/ me since he was sent to ER yesterday. Thanks

## 2012-03-10 NOTE — Telephone Encounter (Signed)
Pt is aware of OV on 7/23 @ 3pm with KJ and appt card was mailed

## 2012-03-12 ENCOUNTER — Telehealth: Payer: Self-pay | Admitting: Internal Medicine

## 2012-03-12 NOTE — Telephone Encounter (Signed)
Spoke with Lorenza Burton, NP who said he could not get narcotics. Said to schedule next available appt. Per Darl Pikes, she had a cancellation for 8:00 AM on Mon with Tana Coast, PA. She scheduled for then and pt is aware.

## 2012-03-12 NOTE — Telephone Encounter (Signed)
Patient is asking for more pain medication for his abdominal pain called to [facility] he states Oxycodein is helping please advise?

## 2012-03-12 NOTE — Telephone Encounter (Signed)
FYI.Marland KitchenMarland KitchenVerlon Oneal, pt was on my schedule earlier in the week & was sent to ER due to significant htn.  He was treated & released & asked to come back for next available OV w/ me, however something sooner has been arranged w/ you.  I discussed his abdominal pain work-up thus far w/ Dr Jena Gauss previously.  Next step would be referral to Pain Clinic if deemed appropriate after OV.

## 2012-03-15 ENCOUNTER — Ambulatory Visit (INDEPENDENT_AMBULATORY_CARE_PROVIDER_SITE_OTHER): Payer: Medicaid Other | Admitting: Gastroenterology

## 2012-03-15 ENCOUNTER — Telehealth: Payer: Self-pay | Admitting: Gastroenterology

## 2012-03-15 ENCOUNTER — Encounter: Payer: Self-pay | Admitting: Gastroenterology

## 2012-03-15 VITALS — BP 122/84 | HR 72 | Temp 98.1°F | Ht 67.0 in | Wt 193.4 lb

## 2012-03-15 DIAGNOSIS — R1084 Generalized abdominal pain: Secondary | ICD-10-CM

## 2012-03-15 DIAGNOSIS — K2970 Gastritis, unspecified, without bleeding: Secondary | ICD-10-CM

## 2012-03-15 DIAGNOSIS — G8929 Other chronic pain: Secondary | ICD-10-CM

## 2012-03-15 DIAGNOSIS — K219 Gastro-esophageal reflux disease without esophagitis: Secondary | ICD-10-CM

## 2012-03-15 DIAGNOSIS — K2990 Gastroduodenitis, unspecified, without bleeding: Secondary | ICD-10-CM | POA: Insufficient documentation

## 2012-03-15 MED ORDER — OXYCODONE-ACETAMINOPHEN 5-325 MG PO TABS: 1.0000 | ORAL_TABLET | Freq: Four times a day (QID) | ORAL | Status: DC | PRN

## 2012-03-15 NOTE — Progress Notes (Signed)
Faxed to PCP

## 2012-03-15 NOTE — Telephone Encounter (Signed)
Any other pain management in [place]?

## 2012-03-15 NOTE — Telephone Encounter (Signed)
[facility] faxed me back and said they do not accept patients insurance and the patient can not afford out of pocket expense please advise futher

## 2012-03-15 NOTE — Telephone Encounter (Signed)
Opened in error

## 2012-03-15 NOTE — Patient Instructions (Addendum)
  We will refer you to pain clinic in [facility] for chronic management of your abdominal pain. One-time prescription for oxycodone provided in the interim. Office visit here as needed.

## 2012-03-15 NOTE — Telephone Encounter (Signed)
I referred patient over to [facility] and they will contact the patient to schedule the appointment. Patient is aware

## 2012-03-15 NOTE — Progress Notes (Signed)
Primary Care Physician: Avon Gully, MD  Primary Gastroenterologist:  Roetta Sessions, MD   Chief Complaint  Patient presents with  . Follow-up    HPI: [name] is a 46 y.o. male here for f/u EGD, chronic abdominal pain. Last week he presented to office for visit but due to hypertensive urgency he was referred to ER for management. He is now on Norvasc. Previously was on lisinopril but stopped in past due to renal insufficiency. Patient states that his doctor never put him back on blood pressure medication at the time stating his blood pressure was okay. Since his endoscopy, he also went emergent department for abdominal pain. CT as outlined below without any significant findings to explain his chronic abdominal pain. EGD showed erosive reflux esophagitis, gastritis, duodenal on his. He was switched from ranitidine to protonix. He states Protonix helping heartburn a lot. Trying to stay away from spicy foods. Some nausea while eating. No vomiting.  Abdominal pain comes whenever it wants too. Not really postprandial. Burning pain in lower abdomen sometimes wakes up at night but also happens throughout the day. Dulcolax once a day. BM daily. No melena, brbpr. Stools dark on iron pills, per kidney doctor. Next appt sometime next year. Abdominal pain every day. Percocet 10mg  given when teeth extracted, made stomach hurt. Percocet (oxycodone 5mg ) three per day given to the ER. Seems to be helping.   Current Outpatient Prescriptions  Medication Sig Dispense Refill  . ALPRAZolam (XANAX) 1 MG tablet Take 1 mg by mouth 4 (four) times daily as needed. Anxiety      . amLODipine (NORVASC) 10 MG tablet Take 0.5 tablets (5 mg total) by mouth daily.  30 tablet  0  . cyclobenzaprine (FLEXERIL) 5 MG tablet Take 5 mg by mouth at bedtime as needed. Muscle Spasms      . docusate sodium (EQL STOOL SOFTENER) 100 MG capsule Take 100 mg by mouth daily as needed. For constipation      . ferrous sulfate 325 (65 FE) MG  tablet Take 325 mg by mouth 2 (two) times daily.      Marland Kitchen oxyCODONE-acetaminophen (PERCOCET) 5-325 MG per tablet Take 1-2 tablets by mouth every 4 (four) hours as needed for pain.  15 tablet  0  . pantoprazole (PROTONIX) 40 MG tablet Take 1 tablet (40 mg total) by mouth daily.  31 tablet  5  . PARoxetine (PAXIL) 20 MG tablet Take 20 mg by mouth 2 (two) times daily.       . promethazine (PHENERGAN) 25 MG tablet Take 1 tablet (25 mg total) by mouth every 6 (six) hours as needed for nausea.  12 tablet  0  . sodium bicarbonate 650 MG tablet Take 650 mg by mouth 2 (two) times daily.        Allergies as of 03/15/2012 - Review Complete 03/15/2012  Allergen Reaction Noted  . Codeine Itching and Nausea And Vomiting   . Latex Other (See Comments) 03/09/2012  . Morphine Hives   . Adhesive (tape) Rash 03/08/2012    ROS:  General: Negative for anorexia, weight loss, fever, chills, fatigue, weakness. ENT: Negative for hoarseness, difficulty swallowing , nasal congestion. CV: Negative for chest pain, angina, palpitations, dyspnea on exertion, peripheral edema.  Respiratory: Negative for dyspnea at rest, dyspnea on exertion, cough, sputum, wheezing.  GI: See history of present illness. GU:  Negative for dysuria, hematuria, urinary incontinence, urinary frequency, nocturnal urination.  Endo: Negative for unusual weight change.    Physical Examination:  BP 122/84  Pulse 72  Temp 98.1 F (36.7 C) (Temporal)  Ht 5\' 7"  (1.702 m)  Wt 193 lb 6.4 oz (87.726 kg)  BMI 30.29 kg/m2  General: Well-nourished, well-developed in no acute distress.  Eyes: No icterus. Mouth: Oropharyngeal mucosa moist and pink , no lesions erythema or exudate. Lungs: Clear to auscultation bilaterally.  Heart: Regular rate and rhythm, no murmurs rubs or gallops.  Abdomen: Bowel sounds are normal, nondistended, no hepatosplenomegaly or masses, no abdominal bruits or hernia , no rebound or guarding.  Diffuse abdominal tenderness  to palpation Extremities: No lower extremity edema. No clubbing or deformities. Neuro: Alert and oriented x 4   Skin: Warm and dry, no jaundice.   Psych: Alert and cooperative, normal mood and affect.  Labs:  Lab Results  Component Value Date   ALT 12 03/08/2012   AST 16 03/08/2012   ALKPHOS 83 03/08/2012   BILITOT 0.2* 03/08/2012   Lab Results  Component Value Date   LIPASE 32 03/08/2012   Lab Results  Component Value Date   CREATININE 1.10 03/09/2012   BUN 11 03/09/2012   NA 139 03/09/2012   K 3.9 03/09/2012   CL 104 03/09/2012   CO2 26 03/08/2012   Lab Results  Component Value Date   WBC 9.7 03/08/2012   HGB 15.6 03/09/2012   HCT 46.0 03/09/2012   MCV 88.8 03/08/2012   PLT 258 03/08/2012    Imaging Studies: Dg Chest 2 View  03/08/2012  *RADIOLOGY REPORT*  Clinical Data: Chest pain, headache.  CHEST - 2 VIEW  Comparison: 02/23/2012,  03/21/2007  Findings: Nodular density in the left lung base, not definitively seen on prior study.  This was noted on more remote studies from 2008 and not significantly changed, most likely scarring.  Right lung is clear.  Heart is normal size.  No effusions or acute bony abnormality.  IMPRESSION: Lingular scar.  No acute findings.  Original Report Authenticated By: Cyndie Chime, M.D.    Ct Head Wo Contrast  03/09/2012  *RADIOLOGY REPORT*  Clinical Data: Headache  CT HEAD WITHOUT CONTRAST  Technique:  Contiguous axial images were obtained from the base of the skull through the vertex without contrast.  Comparison: 03/21/2007  Findings: No evidence of parenchymal hemorrhage or extra-axial fluid collection. No mass lesion, mass effect, or midline shift.  No CT evidence of acute infarction.  Cerebral volume is age appropriate.  No ventriculomegaly.  Intracranial atherosclerosis.  The visualized paranasal sinuses are essentially clear. The mastoid air cells are unopacified.  No evidence of calvarial fracture.  IMPRESSION: No evidence of acute intracranial abnormality.   Intracranial atherosclerosis.  Original Report Authenticated By: Charline Bills, M.D.   Ct Abdomen Pelvis W Contrast  03/08/2012  *RADIOLOGY REPORT*  Clinical Data: Abdominal pain  CT ABDOMEN AND PELVIS WITH CONTRAST  Technique:  Multidetector CT imaging of the abdomen and pelvis was performed following the standard protocol during bolus administration of intravenous contrast.  Contrast: OMNIPAQUE IOHEXOL 300 MG/ML  SOLN  Comparison: 10/05/2009  Findings: The lung bases appear clear.  No pericardial or pleural effusion.  There is no focal liver abnormality.  The gallbladder appears normal.  No biliary dilatation.  Normal appearance of the pancreas. The spleen is normal.  Both adrenal glands are within normal limits.  Normal appearance of the right kidney.  The left kidney is also normal.  No evidence for obstructive uropathy.  The urinary bladder appears within normal limits.  There is no upper  abdominal adenopathy.  No pelvic or inguinal adenopathy.  The stomach appears normal and is distended with enteric contrast material.  The small bowel loops have a normal caliber.  Evidence of prior small bowel resection with enteroenteric anastomoses in the right lower quadrant is identified.  Previous ileostomy site is noted within the right lower quadrant of the abdomen.  There is hernia mesh identified along the undersurface of the ventral abdominal wall.  A left hemicolectomy has been performed. Previous colostomy site is noted within the left lower quadrant ventral abdominal wall, image number 56.  The colorectal anastomosis within the central pelvis has an uncomplicated appearance.  No free fluid or fluid collections within the abdomen or pelvis.  Review of the visualized osseous structures is unremarkable.  IMPRESSION:  1.  No acute findings identified within the abdomen or pelvis. 2.  Evidence of prior small bowel resection and left hemicolectomy. At this time there are no specific features to suggest bowel  obstruction or abscess formation.  Original Report Authenticated By: Rosealee Albee, M.D.

## 2012-03-15 NOTE — Assessment & Plan Note (Addendum)
Chronic abdominal pain. 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 Patient required ileostomy prior to eventual takedown. He has had 3 surgeries related to this. Extensive lysis of adhesions a second surgery (every 3 hours). Suspect chronic abdominal pain secondary to multiple surgeries/adhesions. He has had extensive evaluation now including CT of the abdomen pelvis, lab work, EGD. He has some erosive reflux esophagitis, gastritis/duodenitis but this would not explain his chronic abdominal pain. Typical heartburn symptoms better controlled on protonix.  We will refer you to pain clinic in [facility] for chronic management of your abdominal pain. One-time prescription for oxycodone provided in the interim. Continue Protonix. Office visit when necessary.

## 2012-03-16 NOTE — Telephone Encounter (Signed)
The only ones I know of are here and there is one at [facility]

## 2012-03-23 NOTE — Telephone Encounter (Signed)
I referred patient to Dr. Gerilyn Pilgrim and the referral was sent back to our office stating the the referral needed to be made by PCP and also through [facility] so I gave it to my office manager Durward Mallard Mccollum to handle

## 2012-03-25 NOTE — Telephone Encounter (Signed)
Referral was made to [facility] per [facility] and they called back today and stated they do not treat abdominal or pelvic pain patients, Please advise?

## 2012-03-26 ENCOUNTER — Telehealth: Payer: Self-pay | Admitting: Internal Medicine

## 2012-03-26 NOTE — Telephone Encounter (Signed)
Try [facility]

## 2012-03-26 NOTE — Telephone Encounter (Signed)
Agree 

## 2012-03-26 NOTE — Telephone Encounter (Signed)
Pt was calling to let us know that he has almost out of his protonix. Please call him at [phone]

## 2012-03-26 NOTE — Telephone Encounter (Signed)
Called [facility]- protonix rx was sent in on 03/08/12, pharmacist stated pt has already picked up protonix.Tried to call pt back- spoke with his mother- pt had gone out to eat breakfast, informed her that protonix had already been sent to pharmacy. She asked about his pain pills, informed her that [facility]  gave him Rx one time only and that he would need to see his pcp for pain meds. She verbalized understanding and said they were waiting for him to get appt with the pain clinic.

## 2012-03-29 NOTE — Telephone Encounter (Signed)
Referral was made to [facility] and they will notify me of appointment date and time

## 2012-03-30 ENCOUNTER — Ambulatory Visit: Payer: Medicaid Other | Admitting: Urgent Care

## 2012-03-30 NOTE — Progress Notes (Signed)
Patient ID: [name], male   DOB: [birth year], 46 y.o.   MRN: [id number] Pt's appointment today was reschedule until Tuesday. He wanted to know if we could give him some more pain medication. I told him that we could not give him any more pain medication. I asked him when his appointment was for the pain clinic at [facility] and he said it was Aug 20 th.

## 2012-03-31 ENCOUNTER — Telehealth: Payer: Self-pay

## 2012-03-31 NOTE — Telephone Encounter (Signed)
Pt called this afternoon wanting to know if we could give him some pain medication. I told him that we could not give him anymore pain medication. His appointment at the pain clinic is not until Aug 20.Please advise

## 2012-04-01 NOTE — Telephone Encounter (Signed)
Pt aware.

## 2012-04-01 NOTE — Telephone Encounter (Signed)
Pt should call Dr Felecia Shelling for this.

## 2012-04-06 ENCOUNTER — Ambulatory Visit (INDEPENDENT_AMBULATORY_CARE_PROVIDER_SITE_OTHER): Payer: Medicaid Other | Admitting: Urgent Care

## 2012-04-06 ENCOUNTER — Encounter: Payer: Self-pay | Admitting: Urgent Care

## 2012-04-06 VITALS — BP 134/87 | HR 96 | Temp 98.6°F | Ht 67.0 in | Wt 189.6 lb

## 2012-04-06 DIAGNOSIS — R1084 Generalized abdominal pain: Secondary | ICD-10-CM

## 2012-04-06 DIAGNOSIS — G8929 Other chronic pain: Secondary | ICD-10-CM

## 2012-04-06 DIAGNOSIS — K219 Gastro-esophageal reflux disease without esophagitis: Secondary | ICD-10-CM

## 2012-04-06 MED ORDER — DOCUSATE SODIUM 100 MG PO CAPS: 100.0000 mg | ORAL_CAPSULE | Freq: Two times a day (BID) | ORAL | Status: DC | PRN

## 2012-04-06 MED ORDER — OXYCODONE-ACETAMINOPHEN 5-325 MG PO TABS: 1.0000 | ORAL_TABLET | Freq: Two times a day (BID) | ORAL | Status: AC | PRN

## 2012-04-06 MED ORDER — PANTOPRAZOLE SODIUM 40 MG PO TBEC: 40.0000 mg | DELAYED_RELEASE_TABLET | Freq: Two times a day (BID) | ORAL | Status: DC

## 2012-04-06 NOTE — Progress Notes (Signed)
Primary Care Physician: Avon Gully, MD Primary Gastroenterologist:  Roetta Sessions, MD  Chief Complaint  Patient presents with  . Follow-up    chronic abdominal pain    HPI: [name] is a 46 y.o. male here for f/u chronic abdominal pain. He has had extensive GI work-up & is awaiting Pain Clinic appt in [place], but continues to have chronic daily pain.  C/o "bad pain" to bilat lower quads at site of previous surgical scars.  10/10 at times.  He has been using percocet 3-4 per day & it was making his stomach burn.  He started BID protonix 40mg .  This has squelched his GERD/dyspepsia. He has hx erosive reflux esophagitis on last EGD.  BM ok.  Denies vomiting, & he takes phenergan for nausea.  Has Aug [facility].  Uses [facility].  Current Outpatient Prescriptions  Medication Sig Dispense Refill  . ALPRAZolam (XANAX) 1 MG tablet Take 1 mg by mouth 4 (four) times daily as needed. Anxiety      . amLODipine (NORVASC) 10 MG tablet Take 0.5 tablets (5 mg total) by mouth daily.  30 tablet  0  . cyclobenzaprine (FLEXERIL) 5 MG tablet Take 5 mg by mouth at bedtime as needed. Muscle Spasms      . docusate sodium (EQL STOOL SOFTENER) 100 MG capsule Take 1 capsule (100 mg total) by mouth 2 (two) times daily as needed. For constipation  60 capsule  5  . ferrous sulfate 325 (65 FE) MG tablet Take 325 mg by mouth 2 (two) times daily.      . pantoprazole (PROTONIX) 40 MG tablet Take 1 tablet (40 mg total) by mouth 2 (two) times daily.  60 tablet  5  . PARoxetine (PAXIL) 20 MG tablet Take 20 mg by mouth 2 (two) times daily.       . sodium bicarbonate 650 MG tablet Take 650 mg by mouth 2 (two) times daily.      Marland Kitchen oxyCODONE-acetaminophen (PERCOCET/ROXICET) 5-325 MG per tablet Take 1 tablet by mouth 2 (two) times daily as needed for pain (use only with severe pain.).  60 tablet  0  . promethazine (PHENERGAN) 25 MG tablet Take 1 tablet (25 mg total) by mouth every 6 (six) hours as needed for  nausea.  12 tablet  0    Allergies as of 04/06/2012 - Review Complete 04/06/2012  Allergen Reaction Noted  . Codeine Itching and Nausea And Vomiting   . Latex Other (See Comments) 03/09/2012  . Morphine Hives   . Adhesive (tape) Rash 03/08/2012   Review of Systems: See HPI, otherwise negative ROS  Physical Exam: BP 134/87  Pulse 96  Temp 98.6 F (37 C) (Temporal)  Ht 5\' 7"  (1.702 m)  Wt 189 lb 9.6 oz (86.002 kg)  BMI 29.70 kg/m2 General:   Alert,  Well-developed, well-nourished, pleasant and cooperative in NAD. Head:  Normocephalic and atraumatic. Eyes:  Sclera clear, no icterus.   Conjunctiva pink. Mouth:  No deformity or lesions.  Oropharynx pink & moist. Neck:  Supple; no masses or thyromegaly. Heart:  Regular rate and rhythm; no murmurs, clicks, rubs,  or gallops. Abdomen: +old healed round scars to right & left abdomen.  BS x4.   Soft, nondistended. +TTP entire abdomen, but worst at surgical scars. No masses, hepatosplenomegaly or hernias noted. No guarding, and without rebound.   Msk:  Symmetrical without gross deformities. Normal posture. Pulses:  Normal pulses noted. Extremities:  Without edema. Neurologic:  Alert and  oriented x4;  grossly normal neurologically. Skin:  Intact without significant lesions or rashes. Cervical Nodes:  No significant cervical adenopathy. Psych:  Alert and cooperative. Normal mood and affect.

## 2012-04-06 NOTE — Patient Instructions (Addendum)
Percocet twice daily as needed for severe pain You should keep appointment with pain clinic.  We will no longer supply any chronic pain medications from this office. Continue colace 100mg  twice daily as needed for constipation Continue protonix 40mg  before breakfast & dinner

## 2012-04-07 ENCOUNTER — Encounter: Payer: Self-pay | Admitting: Urgent Care

## 2012-04-07 NOTE — Progress Notes (Signed)
Faxed to PCP

## 2012-04-07 NOTE — Assessment & Plan Note (Addendum)
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  Percocet twice daily as needed for severe pain Keep appointment with pain clinic.  We discussed that from this point forward, we will no longer supply any chronic pain medications from this office. Continue colace 100mg  twice daily as needed for constipation

## 2012-04-07 NOTE — Assessment & Plan Note (Signed)
Well controlled on BID protonix 40mg 

## 2012-04-08 NOTE — Progress Notes (Signed)
REVIEWED.  

## 2012-05-03 ENCOUNTER — Telehealth: Payer: Self-pay

## 2012-05-03 NOTE — Telephone Encounter (Signed)
Please have patient pick up Dexilant 60 mg daily samples for 2 week trial Thanks

## 2012-05-03 NOTE — Telephone Encounter (Signed)
Pt's mother is in the hospital so it will be a while before hs can get here to get the samples.

## 2012-05-03 NOTE — Telephone Encounter (Signed)
Pt called- his protonix is not working anymore and pt wants to know if we can call in something different.

## 2012-05-06 ENCOUNTER — Encounter (HOSPITAL_COMMUNITY): Payer: Self-pay | Admitting: *Deleted

## 2012-05-06 ENCOUNTER — Emergency Department (HOSPITAL_COMMUNITY)
Admission: EM | Admit: 2012-05-06 | Discharge: 2012-05-06 | Disposition: A | Discharge status: Home / Self Care | Payer: Medicaid Other | Attending: Emergency Medicine | Admitting: Emergency Medicine

## 2012-05-06 ENCOUNTER — Emergency Department (HOSPITAL_COMMUNITY): Payer: Medicaid Other

## 2012-05-06 DIAGNOSIS — R1110 Vomiting, unspecified: Secondary | ICD-10-CM | POA: Diagnosis present

## 2012-05-06 DIAGNOSIS — I10 Essential (primary) hypertension: Secondary | ICD-10-CM | POA: Insufficient documentation

## 2012-05-06 DIAGNOSIS — F17200 Nicotine dependence, unspecified, uncomplicated: Secondary | ICD-10-CM | POA: Insufficient documentation

## 2012-05-06 DIAGNOSIS — R51 Headache: Secondary | ICD-10-CM | POA: Insufficient documentation

## 2012-05-06 DIAGNOSIS — K219 Gastro-esophageal reflux disease without esophagitis: Secondary | ICD-10-CM | POA: Insufficient documentation

## 2012-05-06 DIAGNOSIS — G8929 Other chronic pain: Secondary | ICD-10-CM | POA: Diagnosis present

## 2012-05-06 DIAGNOSIS — Z9089 Acquired absence of other organs: Secondary | ICD-10-CM | POA: Insufficient documentation

## 2012-05-06 DIAGNOSIS — R519 Headache, unspecified: Secondary | ICD-10-CM | POA: Diagnosis present

## 2012-05-06 DIAGNOSIS — F411 Generalized anxiety disorder: Secondary | ICD-10-CM | POA: Insufficient documentation

## 2012-05-06 DIAGNOSIS — R109 Unspecified abdominal pain: Secondary | ICD-10-CM | POA: Diagnosis present

## 2012-05-06 DIAGNOSIS — R112 Nausea with vomiting, unspecified: Secondary | ICD-10-CM | POA: Insufficient documentation

## 2012-05-06 LAB — COMPREHENSIVE METABOLIC PANEL
ALT: 13 U/L (ref 0–53)
AST: 16 U/L (ref 0–37)
Albumin: 3.8 g/dL (ref 3.5–5.2)
BUN: 17 mg/dL (ref 6–23)
CO2: 25 mEq/L (ref 19–32)
CO2: 23 mEq/L (ref 19–32)
Calcium: 9.3 mg/dL (ref 8.4–10.5)
Chloride: 103 mEq/L (ref 96–112)
Chloride: 102 mEq/L (ref 96–112)
Creatinine, Ser: 1.21 mg/dL (ref 0.50–1.35)
Creatinine, Ser: 1.17 mg/dL (ref 0.50–1.35)
GFR calc Af Amer: 81 mL/min — ABNORMAL LOW (ref >90)
GFR calc non Af Amer: 70 mL/min — ABNORMAL LOW (ref >90)
GFR calc non Af Amer: 73 mL/min — ABNORMAL LOW (ref >90)
Glucose, Bld: 116 mg/dL — ABNORMAL HIGH (ref 70–99)
Sodium: 137 mEq/L (ref 135–145)
Total Bilirubin: 0.2 mg/dL — ABNORMAL LOW (ref 0.3–1.2)
Total Bilirubin: 0.2 mg/dL — ABNORMAL LOW (ref 0.3–1.2)

## 2012-05-06 LAB — CBC WITH DIFFERENTIAL/PLATELET
Basophils Relative: 0 % (ref 0–1)
Eosinophils Relative: 1 % (ref 0–5)
Eosinophils Relative: 1 % (ref 0–5)
HCT: 42.5 % (ref 39.0–52.0)
HCT: 42.4 % (ref 39.0–52.0)
Hemoglobin: 14.4 g/dL (ref 13.0–17.0)
Lymphocytes Relative: 21 % (ref 12–46)
Lymphs Abs: 2.3 10*3/uL (ref 0.7–4.0)
MCHC: 34 g/dL (ref 30.0–36.0)
MCV: 90.4 fL (ref 78.0–100.0)
MCV: 91 fL (ref 78.0–100.0)
Monocytes Absolute: 0.5 10*3/uL (ref 0.1–1.0)
Monocytes Absolute: 0.5 10*3/uL (ref 0.1–1.0)
Monocytes Relative: 5 % (ref 3–12)
Neutro Abs: 8 10*3/uL — ABNORMAL HIGH (ref 1.7–7.7)
Neutro Abs: 7.7 10*3/uL (ref 1.7–7.7)
RBC: 4.7 MIL/uL (ref 4.22–5.81)
WBC: 10.8 10*3/uL — ABNORMAL HIGH (ref 4.0–10.5)

## 2012-05-06 LAB — URINALYSIS, ROUTINE W REFLEX MICROSCOPIC
Ketones, ur: NEGATIVE mg/dL
Leukocytes, UA: NEGATIVE
Nitrite: NEGATIVE
Protein, ur: NEGATIVE mg/dL

## 2012-05-06 LAB — LIPASE, BLOOD: Lipase: 38 U/L (ref 11–59)

## 2012-05-06 MED ORDER — SODIUM CHLORIDE 0.9 % IV BOLUS (SEPSIS)
1000.0000 mL | INTRAVENOUS | Status: AC
  Administered 2012-05-06: 1000 mL via INTRAVENOUS

## 2012-05-06 MED ORDER — DEXAMETHASONE SODIUM PHOSPHATE 10 MG/ML IJ SOLN
10.0000 mg | Freq: Once | INTRAMUSCULAR | Status: AC
  Administered 2012-05-06: 10 mg via INTRAVENOUS
  Filled 2012-05-06: qty 1

## 2012-05-06 MED ORDER — DIPHENHYDRAMINE HCL 50 MG/ML IJ SOLN
25.0000 mg | Freq: Once | INTRAMUSCULAR | Status: AC
  Administered 2012-05-06: 25 mg via INTRAVENOUS
  Filled 2012-05-06: qty 1

## 2012-05-06 MED ORDER — METOCLOPRAMIDE HCL 5 MG/ML IJ SOLN
10.0000 mg | Freq: Once | INTRAMUSCULAR | Status: AC
  Administered 2012-05-06: 10 mg via INTRAVENOUS
  Filled 2012-05-06: qty 2

## 2012-05-06 NOTE — ED Provider Notes (Signed)
History     CSN: [id number]  Arrival date & time 05/06/12  1054   None     Chief Complaint  Patient presents with  . Abdominal Pain  . Nausea  . Emesis    (Consider location/radiation/quality/duration/timing/severity/associated sxs/prior treatment) Patient is a 46 y.o. male presenting with abdominal pain and vomiting. The history is provided by the patient.  Abdominal Pain The primary symptoms of the illness include abdominal pain and vomiting. The primary symptoms of the illness do not include fever, shortness of breath, nausea, diarrhea or dysuria. The current episode started yesterday. The onset of the illness was gradual. The problem has not changed since onset. The abdominal pain began yesterday. The pain came on gradually. The abdominal pain has been unchanged since its onset. The abdominal pain is generalized. The abdominal pain does not radiate. The severity of the abdominal pain is 8/10. The abdominal pain is relieved by nothing. Exacerbated by: nothing.  The patient has not had a change in bowel habit. Risk factors for an acute abdominal problem include a history of abdominal surgery. Symptoms associated with the illness do not include hematuria.  Emesis  Associated symptoms include abdominal pain. Pertinent negatives include no cough, no diarrhea, no fever and no headaches.    Past Medical History  Diagnosis Date  . Acute pancreatitis   . Cannabis abuse   . Anxiety   . Diverticulitis of colon with perforation 08/2010  . GERD (gastroesophageal reflux disease)   . Hypertension     taken off lisinopril by renal md 08/2011  . Complication of anesthesia     talking out of my head  . Renal insufficiency     follwed by Dr. Fausto Skillern    Past Surgical History  Procedure Date  . Appendectomy   . Umbilical hernia repair   . Right knee surgery   . 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 complicated diverticulitis  . Colonoscopy via stoma  with endoscopy of hartmann pouch 04/2011    [facility]. two sessile polyps in trv colon. complete resection but only partial retrieval. congested mucosa in area 3cm proximal to stoma, erythematous mucosa in Hartmann pouch. repeat tcs 8/2017path-adenomatous polyp  . Colon surgery 05/2011    [facility]. exp laparotomy with colostomy takedown, lysis of adhesions (3hours), loop ileostomy. complicated by abscess formation requiring percutaneous drainage  . Reversal of colostomy 10/2011    per patient, did not have report  . Multiple extractions with alveoloplasty 02/23/2012    Procedure: MULTIPLE EXTRACION WITH ALVEOLOPLASTY;  Surgeon: Georgia Lopes, DDS;  Location: [facility] OR;  Service: Oral Surgery;  Laterality: Bilateral;  Removal of left mandibular torus, multiple extractions with alveoloplasty  . Esophagogastroduodenoscopy 03/05/2012    erosive reflux esophagitis, bulbar erosions, gastric erosions/petichae, bx showed gastritis, no H.Pylori. Procedure: ESOPHAGOGASTRODUODENOSCOPY (EGD);  Surgeon: Corbin Ade, MD;  Location: [facility];  Service: Endoscopy;  Laterality: N/A;  11:30    Family History  Problem Relation Age of Onset  . Stomach cancer Father   . Lung cancer Father   . Throat cancer Father   . Colon cancer Neg Hx   . Liver disease Cousin     cirrhosis, etoh    History  Substance Use Topics  . Smoking status: Current Everyday Smoker -- 1.0 packs/day    Types: Cigarettes  . Smokeless tobacco: Not on file  . Alcohol Use: No     former user quit in 2011  Review of Systems  Constitutional: Negative for fever.  HENT: Negative for rhinorrhea, drooling and neck pain.   Eyes: Negative for pain.  Respiratory: Negative for cough and shortness of breath.   Cardiovascular: Negative for chest pain and leg swelling.  Gastrointestinal: Positive for vomiting and abdominal pain. Negative for nausea and diarrhea.  Genitourinary: Negative for dysuria and hematuria.  Musculoskeletal: Negative  for gait problem.  Skin: Negative for color change.  Neurological: Negative for numbness and headaches.  Hematological: Negative for adenopathy.  Psychiatric/Behavioral: Negative for behavioral problems.  All other systems reviewed and are negative.    Allergies  Codeine; Latex; Morphine; and Adhesive  Home Medications   Current Outpatient Rx  Name Route Sig Dispense Refill  . ALPRAZOLAM 1 MG PO TABS Oral Take 1 mg by mouth 4 (four) times daily as needed. Anxiety    . AMLODIPINE BESYLATE 10 MG PO TABS Oral Take 0.5 tablets (5 mg total) by mouth daily. 30 tablet 0  . CYCLOBENZAPRINE HCL 5 MG PO TABS Oral Take 5 mg by mouth at bedtime as needed. Muscle Spasms    . DOCUSATE SODIUM 100 MG PO CAPS Oral Take 100 mg by mouth 2 (two) times daily as needed. For constipation    . FERROUS SULFATE 325 (65 FE) MG PO TABS Oral Take 325 mg by mouth 2 (two) times daily.    Marland Kitchen NICOTINE 21 MG/24HR TD PT24 Transdermal Place 1 patch onto the skin daily.    Marland Kitchen PANTOPRAZOLE SODIUM 40 MG PO TBEC Oral Take 40 mg by mouth 2 (two) times daily.    Marland Kitchen PAROXETINE HCL 20 MG PO TABS Oral Take 20 mg by mouth 2 (two) times daily.     Marland Kitchen PRESCRIPTION MEDICATION Oral Take 1 tablet by mouth 2 (two) times daily. To take in place of Protonix.    Marland Kitchen PROMETHAZINE HCL 25 MG PO TABS Oral Take 12.5-25 mg by mouth every 6 (six) hours as needed. As needed for nausea.    Marland Kitchen SODIUM BICARBONATE 650 MG PO TABS Oral Take 650 mg by mouth 2 (two) times daily.      BP 138/96  Pulse 78  Temp 98 F (36.7 C) (Oral)  Resp 20  SpO2 96%  Physical Exam  Nursing note and vitals reviewed. Constitutional: He is oriented to person, place, and time. He appears well-developed and well-nourished.  HENT:  Head: Normocephalic and atraumatic.  Right Ear: External ear normal.  Left Ear: External ear normal.  Nose: Nose normal.  Mouth/Throat: Oropharynx is clear and moist. No oropharyngeal exudate.  Eyes: Conjunctivae and EOM are normal. Pupils are  equal, round, and reactive to light.  Neck: Normal range of motion. Neck supple.  Cardiovascular: Normal rate, regular rhythm, normal heart sounds and intact distal pulses.  Exam reveals no gallop and no friction rub.   No murmur heard. Pulmonary/Chest: Effort normal and breath sounds normal. No respiratory distress. He has no wheezes.  Abdominal: Soft. Bowel sounds are normal. He exhibits no distension. There is tenderness (diffuse mild ttp of abdomen). There is no rebound and no guarding.  Musculoskeletal: Normal range of motion. He exhibits no edema and no tenderness.  Neurological: He is alert and oriented to person, place, and time.  Skin: Skin is warm and dry.  Psychiatric: He has a normal mood and affect. His behavior is normal.    ED Course  Procedures (including critical care time)  Labs Reviewed  CBC WITH DIFFERENTIAL - Abnormal; Notable for the following:  WBC 10.8 (*)     Neutro Abs 8.0 (*)     All other components within normal limits  COMPREHENSIVE METABOLIC PANEL - Abnormal; Notable for the following:    Glucose, Bld 116 (*)     Total Bilirubin 0.2 (*)     GFR calc non Af Amer 70 (*)     GFR calc Af Amer 81 (*)     All other components within normal limits  CBC WITH DIFFERENTIAL - Abnormal; Notable for the following:    WBC 10.6 (*)     All other components within normal limits  COMPREHENSIVE METABOLIC PANEL - Abnormal; Notable for the following:    Glucose, Bld 118 (*)     Total Bilirubin 0.2 (*)     GFR calc non Af Amer 73 (*)     GFR calc Af Amer 85 (*)     All other components within normal limits  LIPASE, BLOOD  URINALYSIS, ROUTINE W REFLEX MICROSCOPIC   Dg Abd 2 Views  05/06/2012  *RADIOLOGY REPORT*  Clinical Data: Generalized abdominal pain for 3 days with nausea and vomiting.  History of multiple abdominal surgeries.  ABDOMEN - 2 VIEW  Comparison: Abdominal pelvic CT 03/08/2012. Abdominal radiographs 11/08/2011.  Findings: The bowel gas pattern is  nonobstructive.  There is some stool in the right colon.  There are bowel anastomosis clips in the right abdomen and prior anterior hernia repair.  There is no free intraperitoneal air or acute osseous abnormality.  IMPRESSION: Stable postsurgical abdomen.  No evidence of bowel obstruction.   Original Report Authenticated By: Gerrianne Scale, M.D.      1. Chronic abdominal pain   2. Head ache   3. Vomiting       MDM  11:57 PM 46 y.o. male w hx of RI, divertic w/ colon perf, s/p colostomy w/ reversal pw chronic diffuse abd pain w/ acute worsening yesterday evening. Pt notes mild emesis, denies blood in stools, denies fever. Pt AFVSS here, diffuse mild abd pain on exam, pt appears well. Pt notes pain cw chronic abd pain. He also notes waxing/waning HA since this am cw previous HA's. Pt seen at a pain clinic for his chr pain. He notes that they d/c his po narcotics and are trying steroids to help w/ his pain approx 1 week ago. Will tx HA w/ mig cocktail, screening labs, xr abd to r/o free air/dilated bowel etc.   11:57 PM: Pt feeling better on exam, would like to go home. Interp/Reviewed labs which are non-contrib. Mildly elev wbc cw previous visits.  I have discussed the diagnosis/risks/treatment options with the patient and believe the pt to be eligible for discharge home to follow-up with pcp as needed. We also discussed returning to the ED immediately if new or worsening sx occur. We discussed the sx which are most concerning (e.g., worsening pain, inability to tolerate po) that necessitate immediate return. Any new prescriptions provided to the patient are listed below.  New Prescriptions   No medications on file   Clinical Impression 1. Chronic abdominal pain   2. Head ache   3. Vomiting           Purvis Sheffield, MD 05/06/12 2357

## 2012-05-06 NOTE — Progress Notes (Signed)
REVIEWED.  WOULD NOT PRESCRIBE LARGE QUANTITIES ON NARCOTICS FOR FUNCTIONAL ABD PAIN.

## 2012-05-06 NOTE — ED Provider Notes (Signed)
I saw and evaluated the patient, reviewed the resident's note and I agree with the findings and plan.  Flare-up typical chronic abd pain several days since off narcotics with nonbloody emesis, normal BMs and voiding, mild diffuse tenderness without rebound.  Hurman Horn, MD 05/08/12 [phone]

## 2012-05-06 NOTE — ED Notes (Signed)
Pt reports hx of divertitulitis. States today woke up with generalized abdominal pain. Pt reports nausea and episode of vomiting after eating. Pt with multiple scars on abdomen due to surgeries.

## 2012-05-06 NOTE — ED Notes (Signed)
Pt reports chronic generalized abdominal pain from having surgery in February - pt went to the pain clinic about 9 days ago and was given steroid shots in the abdomen.  Pt stated they only worked for two days.  Pt states he takes percocet 5-325 but reports increased pain with medication.

## 2012-05-06 NOTE — ED Notes (Signed)
Pt brought to room, pt undressing and getting into gown at this time

## 2012-05-12 ENCOUNTER — Telehealth: Payer: Self-pay | Admitting: Internal Medicine

## 2012-05-12 MED ORDER — DEXLANSOPRAZOLE 60 MG PO CPDR: 60.0000 mg | DELAYED_RELEASE_CAPSULE | Freq: Every day | ORAL | Status: DC

## 2012-05-12 NOTE — Telephone Encounter (Signed)
Completed.

## 2012-05-12 NOTE — Telephone Encounter (Signed)
Patient said the dexilant was working well for him and we could call in a prescription to [facility].

## 2013-01-31 ENCOUNTER — Emergency Department (HOSPITAL_COMMUNITY)
Admission: EM | Admit: 2013-01-31 | Discharge: 2013-01-31 | Disposition: A | Discharge status: Home / Self Care | Payer: Medicaid Other | Attending: Emergency Medicine | Admitting: Emergency Medicine

## 2013-01-31 ENCOUNTER — Encounter (HOSPITAL_COMMUNITY): Payer: Self-pay | Admitting: Emergency Medicine

## 2013-01-31 DIAGNOSIS — F17200 Nicotine dependence, unspecified, uncomplicated: Secondary | ICD-10-CM | POA: Insufficient documentation

## 2013-01-31 DIAGNOSIS — Z79899 Other long term (current) drug therapy: Secondary | ICD-10-CM | POA: Insufficient documentation

## 2013-01-31 DIAGNOSIS — K219 Gastro-esophageal reflux disease without esophagitis: Secondary | ICD-10-CM | POA: Insufficient documentation

## 2013-01-31 DIAGNOSIS — Z87448 Personal history of other diseases of urinary system: Secondary | ICD-10-CM | POA: Insufficient documentation

## 2013-01-31 DIAGNOSIS — I10 Essential (primary) hypertension: Secondary | ICD-10-CM | POA: Insufficient documentation

## 2013-01-31 DIAGNOSIS — F32A Depression, unspecified: Secondary | ICD-10-CM

## 2013-01-31 DIAGNOSIS — F1010 Alcohol abuse, uncomplicated: Secondary | ICD-10-CM | POA: Insufficient documentation

## 2013-01-31 DIAGNOSIS — T43502A Poisoning by unspecified antipsychotics and neuroleptics, intentional self-harm, initial encounter: Secondary | ICD-10-CM | POA: Insufficient documentation

## 2013-01-31 DIAGNOSIS — F3289 Other specified depressive episodes: Secondary | ICD-10-CM | POA: Insufficient documentation

## 2013-01-31 DIAGNOSIS — Z9089 Acquired absence of other organs: Secondary | ICD-10-CM | POA: Insufficient documentation

## 2013-01-31 DIAGNOSIS — Z8719 Personal history of other diseases of the digestive system: Secondary | ICD-10-CM | POA: Insufficient documentation

## 2013-01-31 DIAGNOSIS — G8929 Other chronic pain: Secondary | ICD-10-CM | POA: Insufficient documentation

## 2013-01-31 DIAGNOSIS — Z91040 Latex allergy status: Secondary | ICD-10-CM | POA: Insufficient documentation

## 2013-01-31 DIAGNOSIS — R109 Unspecified abdominal pain: Secondary | ICD-10-CM | POA: Insufficient documentation

## 2013-01-31 DIAGNOSIS — Z9889 Other specified postprocedural states: Secondary | ICD-10-CM | POA: Insufficient documentation

## 2013-01-31 DIAGNOSIS — T424X4A Poisoning by benzodiazepines, undetermined, initial encounter: Secondary | ICD-10-CM | POA: Insufficient documentation

## 2013-01-31 DIAGNOSIS — T438X2A Poisoning by other psychotropic drugs, intentional self-harm, initial encounter: Secondary | ICD-10-CM | POA: Insufficient documentation

## 2013-01-31 DIAGNOSIS — F329 Major depressive disorder, single episode, unspecified: Secondary | ICD-10-CM

## 2013-01-31 LAB — RAPID URINE DRUG SCREEN, HOSP PERFORMED
Amphetamines: NOT DETECTED
Benzodiazepines: POSITIVE — AB
Cocaine: POSITIVE — AB

## 2013-01-31 LAB — CBC WITH DIFFERENTIAL/PLATELET
Eosinophils Absolute: 0 10*3/uL (ref 0.0–0.7)
Hemoglobin: 16 g/dL (ref 13.0–17.0)
Lymphs Abs: 3 10*3/uL (ref 0.7–4.0)
MCH: 30.8 pg (ref 26.0–34.0)
MCV: 89 fL (ref 78.0–100.0)
Monocytes Relative: 7 % (ref 3–12)
Neutrophils Relative %: 55 % (ref 43–77)
RBC: 5.19 MIL/uL (ref 4.22–5.81)

## 2013-01-31 LAB — COMPREHENSIVE METABOLIC PANEL
AST: 16 U/L (ref 0–37)
Albumin: 3.9 g/dL (ref 3.5–5.2)
Calcium: 9.4 mg/dL (ref 8.4–10.5)
Creatinine, Ser: 1.33 mg/dL (ref 0.50–1.35)

## 2013-01-31 LAB — ETHANOL: Alcohol, Ethyl (B): 119 mg/dL — ABNORMAL HIGH (ref 0–11)

## 2013-01-31 LAB — SALICYLATE LEVEL: Salicylate Lvl: <2 mg/dL — ABNORMAL LOW (ref 2.8–20.0)

## 2013-01-31 MED ORDER — PANTOPRAZOLE SODIUM 40 MG PO TBEC
40.0000 mg | DELAYED_RELEASE_TABLET | Freq: Once | ORAL | Status: AC
  Administered 2013-01-31: 40 mg via ORAL
  Filled 2013-01-31: qty 1

## 2013-01-31 MED ORDER — HYDROCODONE-ACETAMINOPHEN 5-325 MG PO TABS
1.0000 | ORAL_TABLET | Freq: Once | ORAL | Status: AC
  Administered 2013-01-31: 1 via ORAL
  Filled 2013-01-31: qty 1

## 2013-01-31 MED ORDER — DEXLANSOPRAZOLE 60 MG PO CPDR: 60.0000 mg | DELAYED_RELEASE_CAPSULE | Freq: Every day | ORAL | Status: DC

## 2013-01-31 MED ORDER — HYDROCODONE-ACETAMINOPHEN 5-325 MG PO TABS: 1.0000 | ORAL_TABLET | Freq: Four times a day (QID) | ORAL | Status: DC | PRN

## 2013-01-31 MED ORDER — NICOTINE 21 MG/24HR TD PT24: 21.0000 mg | MEDICATED_PATCH | Freq: Every day | TRANSDERMAL | Status: DC

## 2013-01-31 NOTE — ED Provider Notes (Signed)
Pt no longer suicidal and will contract for safety and follow up [facility]  Benny Lennert, MD 01/31/13 [phone]

## 2013-01-31 NOTE — BH Assessment (Signed)
Assessment Note   [name] is an 47 y.o. male. He was brought to the Emergency Room for a Pseudo-Suicide attempt, though the patient is denying that he was trying to harm himself currently. He reports that he is experiencing some grief due to the death of his grandfather; who apparently died last week. He states that he has been feeling sad and misses him, saying, "he was my friend."  He reports that he lives alone. He does not have any guns. He states he doesn't have any other xanax in the home. His vitals and most labs came back WNL considering the initial self report of ingesting the 10 tablets. He states he is not suicidal and that he really wasn't trying to harm himself; that he just got a bit depressed. He has seen [facility] in the past, however, he does not want to return there. I discussed with him the option of going to [facility], who are expanding their services to adults. He agreed to receive their information and call them for an appointment. He denies that he has any weapons in the home and continues to deny suicidal ideation at this time. Asked if he had any thoughts of harming anyone else, and he stated that he did not. No delusions or hallucinations noted at this time.  Assessed for substance abuse, including offering treatment; patient reports that he drank one pint of moonshine yesterday. He reports that he hasn't drank any in quite awhile; though he may be under-reporting his use. He is requesting pain medication, stating that his stomach hurts. I did recommend to him, that with his history, he needs to avoid abusing Alcohol (whether bottled or moonshine) and other illicit drugs as this will increase the symptomology of his currently medical issues. He does not desire any SA treatment at this time. He denies any other abuse of drugs currently; though he is positive for cocaine and cannabis as well.  He does not have any history of seizures related to withdrawal or DT's. He reports that  he sleeps well, as long as he is not in any pain. No nightmares or crying spells reported at this time either. He denies the need for detox and states drinking the moonshine is not a regular habit for him.   He contracts for safety. He stated again that he wasn't trying to really harm himself; that he was feeling down about the loss of his grandfather. Discussed with Dr. Estell Harpin, who agreed if patient denies SI and HI; that he can be referred to outpatient services for follow up.  Patient is requesting something for his stomach pain; relayed information to MD.  Axis I: Substance Abuse; Polysubstance Abuse; Substance Abused mood disorder Axis II: Deferred Axis III: See current medical history Axis IV: recent stressors include the loss of grandfather, financial issues; poor understanding of substance abuse disorder Axis V: GAF:[id number]  Past Medical History:  Past Medical History  Diagnosis Date  . Acute pancreatitis   . Cannabis abuse   . Anxiety   . Diverticulitis of colon with perforation 08/2010  . GERD (gastroesophageal reflux disease)   . Hypertension     taken off lisinopril by renal md 08/2011  . Complication of anesthesia     talking out of my head  . Renal insufficiency     follwed by Dr. Fausto Skillern    Past Surgical History  Procedure Laterality Date  . Appendectomy    . Umbilical hernia repair    . Right knee  surgery    . Sigmoid colectomy with end colostomy, hartmann's pouch  08/2010    [facility]. complicated diverticulitis  . Colonoscopy via stoma with endoscopy of hartmann pouch  04/2011    [facility]. two sessile polyps in trv colon. complete resection but only partial retrieval. congested mucosa in area 3cm proximal to stoma, erythematous mucosa in Hartmann pouch. repeat tcs 8/2017path-adenomatous polyp  . Colon surgery  05/2011    [facility]. exp laparotomy with colostomy takedown, lysis of adhesions (3hours), loop ileostomy. complicated by abscess formation requiring percutaneous  drainage  . Reversal of colostomy  10/2011    per patient, did not have report  . Multiple extractions with alveoloplasty  02/23/2012    Procedure: MULTIPLE EXTRACION WITH ALVEOLOPLASTY;  Surgeon: Georgia Lopes, DDS;  Location: [facility] OR;  Service: Oral Surgery;  Laterality: Bilateral;  Removal of left mandibular torus, multiple extractions with alveoloplasty  . Esophagogastroduodenoscopy  03/05/2012    erosive reflux esophagitis, bulbar erosions, gastric erosions/petichae, bx showed gastritis, no H.Pylori. Procedure: ESOPHAGOGASTRODUODENOSCOPY (EGD);  Surgeon: Corbin Ade, MD;  Location: [facility];  Service: Endoscopy;  Laterality: N/A;  11:30    Family History:  Family History  Problem Relation Age of Onset  . Stomach cancer Father   . Lung cancer Father   . Throat cancer Father   . Colon cancer Neg Hx   . Liver disease Cousin     cirrhosis, etoh    Social History:  reports that he has been smoking Cigarettes.  He has been smoking about 1.00 pack per day. He does not have any smokeless tobacco history on file. He reports that  drinks alcohol. He reports that he uses illicit drugs (Marijuana and Cocaine).  Additional Social History:     CIWA: CIWA-Ar BP: 174/115 mmHg Pulse Rate: 83 COWS:    Allergies:  Allergies  Allergen Reactions  . Codeine Itching and Nausea And Vomiting  . Latex Other (See Comments)    Turns skin red.   . Morphine Hives  . Adhesive (Tape) Rash    Home Medications:  (Not in a hospital admission)  OB/GYN Status:  No LMP for male patient.  General Assessment Data Location of Assessment: [facility] ACT Assessment: Yes Living Arrangements: Alone Can pt return to current living arrangement?: Yes Admission Status: Voluntary Is patient capable of signing voluntary admission?: Yes Transfer from: Home Referral Source: MD  Education Status Is patient currently in school?: No  Risk to self Suicidal Ideation: No Suicidal Intent: No Is patient at risk  for suicide?: No Suicidal Plan?: No Access to Means: No What has been your use of drugs/alcohol within the last 12 months?:  (alcohol; drug abuse ) Previous Attempts/Gestures: No Intentional Self Injurious Behavior: None Family Suicide History: Unknown Recent stressful life event(s): Loss (Comment) (grandfather died last week) Persecutory voices/beliefs?: No Depression: Yes Depression Symptoms: Loss of interest in usual pleasures Substance abuse history and/or treatment for substance abuse?: Yes Suicide prevention information given to non-admitted patients: Yes  Risk to Others Homicidal Ideation: No Thoughts of Harm to Others: No Current Homicidal Intent: No Current Homicidal Plan: No Access to Homicidal Means: No History of harm to others?: No Assessment of Violence: None Noted Violent Behavior Description:  (cooperative, sleepy) Does patient have access to weapons?: No Criminal Charges Pending?: No Does patient have a court date: No  Psychosis Hallucinations: None noted Delusions: None noted  Mental Status Report Appear/Hygiene: Disheveled Eye Contact: Fair Motor Activity: Psychomotor retardation Speech: Logical/coherent Level of Consciousness:  Quiet/awake Mood: Depressed Affect: Appropriate to circumstance Anxiety Level: Minimal Thought Processes: Relevant Judgement: Unimpaired Orientation: Person;Place;Time;Situation;Appropriate for developmental age Obsessive Compulsive Thoughts/Behaviors: None  Cognitive Functioning Concentration: Decreased Memory: Recent Intact;Remote Intact IQ: Average Insight: Fair Impulse Control: Fair Appetite: Good Sleep: No Change Total Hours of Sleep:  (7) Vegetative Symptoms: None  ADLScreening [facility] Assessment Services) Patient's cognitive ability adequate to safely complete daily activities?: Yes Patient able to express need for assistance with ADLs?: Yes Independently performs ADLs?: Yes (appropriate for developmental  age)  Abuse/Neglect [facility]) Physical Abuse: Denies Verbal Abuse: Denies Sexual Abuse: Denies  Prior Inpatient Therapy Prior Inpatient Therapy: No  Prior Outpatient Therapy Prior Outpatient Therapy: Yes Prior Therapy Facilty/Provider(s):  ([facility]) Reason for Treatment:  (depression, SA)  ADL Screening (condition at time of admission) Patient's cognitive ability adequate to safely complete daily activities?: Yes Patient able to express need for assistance with ADLs?: Yes Independently performs ADLs?: Yes (appropriate for developmental age)       Abuse/Neglect Assessment (Assessment to be complete while patient is alone) Physical Abuse: Denies Verbal Abuse: Denies Sexual Abuse: Denies Values / Beliefs Cultural Requests During Hospitalization: None Spiritual Requests During Hospitalization: None        Additional Information 1:1 In Past 12 Months?: No CIRT Risk: No Elopement Risk: No Does patient have medical clearance?: Yes     Disposition:  Disposition Initial Assessment Completed for this Encounter: Yes Disposition of Patient: Outpatient treatment Type of outpatient treatment: Adult  On Site Evaluation by:  Dr. Estell Harpin Reviewed with Physician:  Dr. Estell Harpin  Patient signed no harm contract; referral information given to patient on the copy of the contract.   Shon Baton H 01/31/2013 9:17 AM

## 2013-01-31 NOTE — ED Notes (Signed)
Pt states "I don't normally drink but I had a pint of moonshine"

## 2013-01-31 NOTE — ED Notes (Signed)
Spoke with Onalee Hua at [facility] [employer] [facility] [employer]. He suggested that we monitor the patient for decreased level of consciousness and run the blood tests already ordered.

## 2013-01-31 NOTE — ED Notes (Signed)
Per EMS: Pt had 10-12 "peach colored xanax". Was on phone with crisis control when EMS arrived on scene . States "I'm depressed. Not really depressed. My mother died in October 28, 2022. My grandfather died last week".

## 2013-01-31 NOTE — ED Provider Notes (Addendum)
History     CSN: [id number]  Arrival date & time 01/31/13  0330   First MD Initiated Contact with Patient 01/31/13 [phone]      Chief Complaint  Patient presents with  . Drug Overdose     Patient is a 47 y.o. male presenting with Overdose. The history is provided by the patient.  Drug Overdose This is a new problem. The current episode started 3 to 5 hours ago. The problem occurs constantly. The problem has not changed since onset.Associated symptoms include abdominal pain. Pertinent negatives include no shortness of breath. Nothing aggravates the symptoms. Nothing relieves the symptoms.  pt presents after an overdose attempt He reports he starting drinking "moonshine" at 6pm on 5/25 and then he became depressed and took 10-12 xanax later in the evening.  He denies any other co-ingestants.  He reports smoked cocaine 5 days ago Denies any use of pain meds this evening  He reports chronic abdominal pain and he is requesting pain meds   Past Medical History  Diagnosis Date  . Acute pancreatitis   . Cannabis abuse   . Anxiety   . Diverticulitis of colon with perforation 08/2010  . GERD (gastroesophageal reflux disease)   . Hypertension     taken off lisinopril by renal md 08/2011  . Complication of anesthesia     talking out of my head  . Renal insufficiency     follwed by Dr. Fausto Skillern    Past Surgical History  Procedure Laterality Date  . Appendectomy    . Umbilical hernia repair    . Right knee surgery    . Sigmoid colectomy with end colostomy, hartmann's pouch  08/2010    [facility]. complicated diverticulitis  . Colonoscopy via stoma with endoscopy of hartmann pouch  04/2011    [facility]. two sessile polyps in trv colon. complete resection but only partial retrieval. congested mucosa in area 3cm proximal to stoma, erythematous mucosa in Hartmann pouch. repeat tcs 8/2017path-adenomatous polyp  . Colon surgery  05/2011    [facility]. exp laparotomy with colostomy takedown, lysis of  adhesions (3hours), loop ileostomy. complicated by abscess formation requiring percutaneous drainage  . Reversal of colostomy  10/2011    per patient, did not have report  . Multiple extractions with alveoloplasty  02/23/2012    Procedure: MULTIPLE EXTRACION WITH ALVEOLOPLASTY;  Surgeon: Georgia Lopes, DDS;  Location: [facility] OR;  Service: Oral Surgery;  Laterality: Bilateral;  Removal of left mandibular torus, multiple extractions with alveoloplasty  . Esophagogastroduodenoscopy  03/05/2012    erosive reflux esophagitis, bulbar erosions, gastric erosions/petichae, bx showed gastritis, no H.Pylori. Procedure: ESOPHAGOGASTRODUODENOSCOPY (EGD);  Surgeon: Corbin Ade, MD;  Location: [facility];  Service: Endoscopy;  Laterality: N/A;  11:30    Family History  Problem Relation Age of Onset  . Stomach cancer Father   . Lung cancer Father   . Throat cancer Father   . Colon cancer Neg Hx   . Liver disease Cousin     cirrhosis, etoh    History  Substance Use Topics  . Smoking status: Current Every Day Smoker -- 1.00 packs/day    Types: Cigarettes  . Smokeless tobacco: Not on file  . Alcohol Use: Yes     Comment: former user quit in 2011      Review of Systems  Constitutional: Negative for fever.  Eyes: Negative for visual disturbance.  Respiratory: Negative for shortness of breath.   Gastrointestinal: Positive for abdominal pain. Negative for vomiting.  Chronic abdominal pain   Neurological: Negative for weakness.  Psychiatric/Behavioral: Positive for suicidal ideas.  All other systems reviewed and are negative.    Allergies  Codeine; Latex; Morphine; and Adhesive  Home Medications   Current Outpatient Rx  Name  Route  Sig  Dispense  Refill  . ALPRAZolam (XANAX) 1 MG tablet   Oral   Take 1 mg by mouth 4 (four) times daily as needed. Anxiety         . amLODipine (NORVASC) 10 MG tablet   Oral   Take 0.5 tablets (5 mg total) by mouth daily.   30 tablet   0   .  cyclobenzaprine (FLEXERIL) 5 MG tablet   Oral   Take 5 mg by mouth at bedtime as needed. Muscle Spasms         . EXPIRED: dexlansoprazole (DEXILANT) 60 MG capsule   Oral   Take 1 capsule (60 mg total) by mouth daily.   30 capsule   5   . docusate sodium (COLACE) 100 MG capsule   Oral   Take 100 mg by mouth 2 (two) times daily as needed. For constipation         . ferrous sulfate 325 (65 FE) MG tablet   Oral   Take 325 mg by mouth 2 (two) times daily.         . nicotine (NICODERM CQ - DOSED IN MG/24 HOURS) 21 mg/24hr patch   Transdermal   Place 1 patch onto the skin daily.         . pantoprazole (PROTONIX) 40 MG tablet   Oral   Take 40 mg by mouth 2 (two) times daily.         Marland Kitchen PARoxetine (PAXIL) 20 MG tablet   Oral   Take 20 mg by mouth 2 (two) times daily.          Marland Kitchen PRESCRIPTION MEDICATION   Oral   Take 1 tablet by mouth 2 (two) times daily. To take in place of Protonix.         . promethazine (PHENERGAN) 25 MG tablet   Oral   Take 12.5-25 mg by mouth every 6 (six) hours as needed. As needed for nausea.         . sodium bicarbonate 650 MG tablet   Oral   Take 650 mg by mouth 2 (two) times daily.           BP 146/107  Pulse 83  Temp(Src) 97.7 F (36.5 C) (Oral)  Resp 20  Ht 5\' 7"  (1.702 m)  Wt 192 lb (87.091 kg)  BMI 30.06 kg/m2  SpO2 95%  Physical Exam CONSTITUTIONAL: disheveled.  He appear intoxicated HEAD: Normocephalic/atraumatic EYES: EOMI/PERRL ENMT: Mucous membranes dry NECK: supple no meningeal signs SPINE:entire spine nontender CV: S1/S2 noted, no murmurs/rubs/gallops noted LUNGS: Lungs are clear to auscultation bilaterally, no apparent distress ABDOMEN: well healed scars noted.  soft, nontender, no rebound or guarding GU:no cva tenderness NEURO: Pt is awake/alert, moves all extremitiesx4 EXTREMITIES: pulses normal, full ROM SKIN: warm, color normal PSYCH: flat affect  ED Course  Procedures (including critical care  time)  Labs Reviewed  COMPREHENSIVE METABOLIC PANEL  CBC WITH DIFFERENTIAL  ACETAMINOPHEN LEVEL  ETHANOL  URINE RAPID DRUG SCREEN (HOSP PERFORMED)  SALICYLATE LEVEL  OSMOLALITY   3:59 AM Pt presents after overdose attempt, he reports drinking alcohol (moonshine) and also taking up 12 xanax.  He appears intoxicated but is currently hemodynamically stable and protecting his airway  Labs ordered.  Will follow closely 5:15 AM Initial labs unrevealing Will need ACT evaluation Will repeat APAP level at 0730am to ensure no dynamic change 6:05 AM Osmolality pending (sent because patient reports he drank homemade alcohol)and will not result for 24 hours However, pt has no anion gap and no neurologic symptoms to suggest toxic alcohol poisoning  MDM  Nursing notes including past medical history and social history reviewed and considered in documentation Labs/vital reviewed and considered        Date: 01/31/2013  Rate: 76  Rhythm: normal sinus rhythm  QRS Axis: normal  Intervals: normal  ST/T Wave abnormalities: normal  Conduction Disutrbances:none     Joya Gaskins, MD 01/31/13 0981  Joya Gaskins, MD 01/31/13 [phone]

## 2016-02-06 DIAGNOSIS — Z139 Encounter for screening, unspecified: Secondary | ICD-10-CM

## 2016-05-04 NOTE — Congregational Nurse Program (Signed)
Congregational Nurse Program Note  Date of Encounter: 02/06/2016  Past Medical History: Past Medical History:  Diagnosis Date  . Acute pancreatitis   . Anxiety   . Cannabis abuse   . Complication of anesthesia    talking out of my head  . Diverticulitis of colon with perforation 08/2010  . GERD (gastroesophageal reflux disease)   . Hypertension    taken off lisinopril by renal md 08/2011  . Renal insufficiency    follwed by Dr. Hinda Lenis    Encounter Details: Client was assisted with a referral to [facility] to establish a primary provider.Client has history of hypertension. Informed client importance of Having regular checkups to monitor BP and comply with Medications. Tarri Fuller CNP [phone]

## 2017-05-27 ENCOUNTER — Encounter: Payer: Self-pay | Admitting: Physician Assistant

## 2017-05-27 ENCOUNTER — Ambulatory Visit: Payer: Self-pay | Admitting: Physician Assistant

## 2017-05-27 VITALS — BP 122/88 | HR 99 | Temp 98.2°F | Wt 181.8 lb

## 2017-05-27 DIAGNOSIS — G8929 Other chronic pain: Secondary | ICD-10-CM

## 2017-05-27 DIAGNOSIS — E785 Hyperlipidemia, unspecified: Secondary | ICD-10-CM

## 2017-05-27 DIAGNOSIS — J449 Chronic obstructive pulmonary disease, unspecified: Secondary | ICD-10-CM

## 2017-05-27 DIAGNOSIS — F17219 Nicotine dependence, cigarettes, with unspecified nicotine-induced disorders: Secondary | ICD-10-CM

## 2017-05-27 DIAGNOSIS — I10 Essential (primary) hypertension: Secondary | ICD-10-CM

## 2017-05-27 MED ORDER — ATORVASTATIN CALCIUM 10 MG PO TABS: 10.0000 mg | ORAL_TABLET | Freq: Every day | ORAL | 1 refills | Status: DC

## 2017-05-27 NOTE — Progress Notes (Signed)
BP 122/88 (BP Location: Left Arm, Patient Position: Sitting, Cuff Size: Normal)   Pulse 99   Temp 98.2 F (36.8 C)   Wt 181 lb 12.8 oz (82.5 kg)   SpO2 96%   BMI 28.47 kg/m    Subjective:    Patient ID: [name], male    DOB: 11/27/1965, 51 y.o.   MRN: [id number]  HPI: [name] is a 51 y.o. male presenting on 05/27/2017 for Follow-up   HPI   I am seeing pt at [facility] clinic as interim provider until new permanent provider takes over mid-October  Pt here for BP recheck due to recent medication changes.  Pt was started on carvedilol 2 wk ago and other bp meds were stopped.    Pt is supposed to be on lipitor but says he hasn't gotten it from [facility] yet.    pt asks for pain medication.  Discussed with pt that at his last OV he was instructed to contact [facility] in reference to that.  He is reminded to do that if he has not (he hasn't yet)  Relevant past medical, surgical, family and social history reviewed and updated as indicated. Interim medical history since our last visit reviewed. Allergies and medications reviewed and updated.    Current Outpatient Prescriptions:  .  albuterol (PROVENTIL HFA;VENTOLIN HFA) 108 (90 Base) MCG/ACT inhaler, Inhale 2 puffs into the lungs every 6 (six) hours as needed for wheezing or shortness of breath., Disp: , Rfl:  .  carvedilol (COREG) 12.5 MG tablet, Take 12.5 mg by mouth 2 (two) times daily with a meal., Disp: , Rfl:  .  mometasone-formoterol (DULERA) 100-5 MCG/ACT AERO, Inhale 2 puffs into the lungs 2 (two) times daily., Disp: , Rfl:  .  ranitidine (ZANTAC) 150 MG tablet, Take 150 mg by mouth 2 (two) times daily., Disp: , Rfl:    Review of Systems  Per HPI unless specifically indicated above     Objective:    BP 122/88 (BP Location: Left Arm, Patient Position: Sitting, Cuff Size: Normal)   Pulse 99   Temp 98.2 F (36.8 C)   Wt 181 lb 12.8 oz (82.5 kg)   SpO2 96%   BMI 28.47 kg/m   Wt Readings from Last 3  Encounters:  05/27/17 181 lb 12.8 oz (82.5 kg)  01/31/13 192 lb (87.1 kg)  04/06/12 189 lb 9.6 oz (86 kg)    Physical Exam  Constitutional: He is oriented to person, place, and time. He appears well-developed and well-nourished.  HENT:  Head: Normocephalic and atraumatic.  Neck: Neck supple.  Cardiovascular: Normal rate and regular rhythm.   Pulmonary/Chest: Effort normal and breath sounds normal. He has no wheezes.  Abdominal: Soft. Bowel sounds are normal. There is no hepatosplenomegaly. There is no tenderness.  Musculoskeletal: He exhibits no edema.  Lymphadenopathy:    He has no cervical adenopathy.  Neurological: He is alert and oriented to person, place, and time.  Skin: Skin is warm and dry.  Psychiatric: He has a normal mood and affect. His behavior is normal.  Vitals reviewed.       Assessment & Plan:   Encounter Diagnoses  Name Primary?  . Essential hypertension Yes  . Hyperlipidemia, unspecified hyperlipidemia type   . Chronic obstructive pulmonary disease, unspecified COPD type ([place])   . Cigarette nicotine dependence with nicotine-induced disorder   . Other chronic pain     -bp improved.  Will recheck Cr in near future -no changes to  medications today -lipitor rx sent to [facility] (resent- had been sent 04/08/17) -pt to follow up in 6 weeks.  RTO sooner prn.

## 2017-07-15 ENCOUNTER — Telehealth: Payer: Self-pay

## 2017-07-15 NOTE — Telephone Encounter (Signed)
Pt who at some point was connected with [facility] was called today 07/15/2017 for follow-up and to update his information.   LPN left message to return call.  If haven't heard from him will call back.       [name] R. Tiago Humphrey LPN [phone]

## 2017-07-21 ENCOUNTER — Telehealth: Payer: Self-pay

## 2017-07-21 NOTE — Telephone Encounter (Signed)
Pt who at some point was connected with [facility] was called on 07/15/2017 for follow-up and to update his information. No answer. Message was left.  After not hearing from patient for a few days attempted to call again for the second time today 07/21/2017.  No answer.  Message was left.       Daemion Mcniel R. Presleigh Feldstein LPN [phone]

## 2017-07-27 ENCOUNTER — Telehealth: Payer: Self-pay

## 2017-07-27 NOTE — Telephone Encounter (Signed)
Pt who at some point was connected with [facility] was called for third time today 07/27/2017.  No answer.  Message was left.     Ivylynn Hoppes R. Lamya Lausch LPN [phone]

## 2017-09-16 ENCOUNTER — Encounter: Payer: Self-pay | Admitting: Internal Medicine

## 2017-09-24 ENCOUNTER — Ambulatory Visit (INDEPENDENT_AMBULATORY_CARE_PROVIDER_SITE_OTHER): Payer: Self-pay | Admitting: Gastroenterology

## 2017-09-24 ENCOUNTER — Encounter: Payer: Self-pay | Admitting: Gastroenterology

## 2017-09-24 ENCOUNTER — Encounter: Payer: Self-pay | Admitting: *Deleted

## 2017-09-24 ENCOUNTER — Other Ambulatory Visit (HOSPITAL_COMMUNITY)
Admission: RE | Admit: 2017-09-24 | Discharge: 2017-09-24 | Disposition: A | Discharge status: Home / Self Care | Payer: Self-pay | Source: Ambulatory Visit | Attending: Gastroenterology | Admitting: Gastroenterology

## 2017-09-24 VITALS — BP 146/98 | HR 79 | Temp 97.2°F | Ht 66.0 in | Wt 178.6 lb

## 2017-09-24 DIAGNOSIS — R1319 Other dysphagia: Secondary | ICD-10-CM | POA: Insufficient documentation

## 2017-09-24 DIAGNOSIS — Z860100 Personal history of colon polyps, unspecified: Secondary | ICD-10-CM | POA: Insufficient documentation

## 2017-09-24 DIAGNOSIS — R1011 Right upper quadrant pain: Secondary | ICD-10-CM

## 2017-09-24 DIAGNOSIS — R1310 Dysphagia, unspecified: Secondary | ICD-10-CM | POA: Insufficient documentation

## 2017-09-24 DIAGNOSIS — R109 Unspecified abdominal pain: Secondary | ICD-10-CM | POA: Insufficient documentation

## 2017-09-24 DIAGNOSIS — Z9889 Other specified postprocedural states: Secondary | ICD-10-CM | POA: Insufficient documentation

## 2017-09-24 DIAGNOSIS — G8929 Other chronic pain: Secondary | ICD-10-CM

## 2017-09-24 LAB — COMPREHENSIVE METABOLIC PANEL
ALK PHOS: 70 U/L (ref 38–126)
ALT: 15 U/L — ABNORMAL LOW (ref 17–63)
ANION GAP: 9 (ref 5–15)
AST: 19 U/L (ref 15–41)
Albumin: 4.2 g/dL (ref 3.5–5.0)
BILIRUBIN TOTAL: 0.6 mg/dL (ref 0.3–1.2)
BUN: 21 mg/dL — ABNORMAL HIGH (ref 6–20)
CALCIUM: 9.4 mg/dL (ref 8.9–10.3)
CO2: 22 mmol/L (ref 22–32)
Chloride: 104 mmol/L (ref 101–111)
Creatinine, Ser: 1.34 mg/dL — ABNORMAL HIGH (ref 0.61–1.24)
GFR, EST NON AFRICAN AMERICAN: 59 mL/min — AB (ref >60)
Glucose, Bld: 89 mg/dL (ref 65–99)
POTASSIUM: 4.4 mmol/L (ref 3.5–5.1)
Sodium: 135 mmol/L (ref 135–145)
TOTAL PROTEIN: 7.8 g/dL (ref 6.5–8.1)

## 2017-09-24 LAB — CBC WITH DIFFERENTIAL/PLATELET
Basophils Absolute: 0 10*3/uL (ref 0.0–0.1)
Basophils Relative: 0 %
Eosinophils Absolute: 0.1 10*3/uL (ref 0.0–0.7)
Eosinophils Relative: 1 %
HEMATOCRIT: 49.7 % (ref 39.0–52.0)
HEMOGLOBIN: 16.8 g/dL (ref 13.0–17.0)
LYMPHS ABS: 2.7 10*3/uL (ref 0.7–4.0)
Lymphocytes Relative: 30 %
MCH: 31 pg (ref 26.0–34.0)
MCHC: 33.8 g/dL (ref 30.0–36.0)
MCV: 91.7 fL (ref 78.0–100.0)
MONO ABS: 0.7 10*3/uL (ref 0.1–1.0)
Monocytes Relative: 7 %
NEUTROS ABS: 5.6 10*3/uL (ref 1.7–7.7)
NEUTROS PCT: 62 %
Platelets: 215 10*3/uL (ref 150–400)
RBC: 5.42 MIL/uL (ref 4.22–5.81)
RDW: 13.1 % (ref 11.5–15.5)
WBC: 9.1 10*3/uL (ref 4.0–10.5)

## 2017-09-24 LAB — LIPASE, BLOOD: Lipase: 53 U/L — ABNORMAL HIGH (ref 11–51)

## 2017-09-24 NOTE — Patient Instructions (Signed)
1. Please have your labs and CT scan done. Further recommendations to follow. Eventually we will plan on upper endoscopy and colonoscopy.

## 2017-09-24 NOTE — Progress Notes (Signed)
CC'D TO PCP °

## 2017-09-24 NOTE — Progress Notes (Signed)
Primary Care Physician:  Wyatt Haste, NP  Primary Gastroenterologist:  Garfield Cornea, MD   Chief Complaint  Patient presents with  . Abdominal Pain    ruq at colostomy scar    HPI:  [name] is a 52 y.o. male here for further evaluation of right upper quadrant abdominal pain, history of diverticulitis at the request of [facility], Rosealee Albee, NP.  Patient has a history of chronic abdominal pain.  He was last seen by our facility in 2013.  Patient's past medical history significant for ventral hernia repair with mesh in 2010 by Dr. Lurlean Nanny.  In 2011 he had perforated diverticulitis requiring resection and colostomy bag.  He was in prison at the time per patient for DUI.  He went back for colostomy reversal around 2012 according to the patient surgery was complicated by injury to the colon requiring an ileostomy.  Ultimately he had the ileostomy reversed.  Patient's last colonoscopy was in August 2012 at [facility], performed via stoma.  2 sessile polyps in the transverse colon removed/partial retrieval, congested mucosa in the area 3 cm proximal to the stoma, erythematous mucosa in the Hartmann pouch.  Repeat colonoscopy recommended in August 2017 for adenomatous colon polyps.  Patient states he has been out of jail this time for about 2 years.  He was in for 3 years for DUI.  Denies alcohol use in 5 years.  States his heartburn is mostly controlled on ranitidine 300 mg 1-2 times daily.  Cannot afford PPI.  Complains of some solid food dysphagia.  No vomiting.  Reports 15 pound weight loss in the last couple years.  Appetite is poor.  BMs about 3-4 times daily but some days no BM.  Bright red blood noted tissue at times.  No melena.  Last CT abdomen and pelvis with contrast in December 2017 at [facility].  Status post left hemicolectomy with surgical anastomosis in the sigmoid colon.  No complicating features noted.  Appendix have been removed.  Fatty liver.  Small  fat-containing bilateral inguinal hernias.   Current Outpatient Medications  Medication Sig Dispense Refill  . albuterol (PROVENTIL HFA;VENTOLIN HFA) 108 (90 Base) MCG/ACT inhaler Inhale 2 puffs into the lungs every 6 (six) hours as needed for wheezing or shortness of breath.    Marland Kitchen atorvastatin (LIPITOR) 10 MG tablet Take 1 tablet (10 mg total) by mouth daily. 90 tablet 1  . cyclobenzaprine (FLEXERIL) 5 MG tablet Take 1 tablet by mouth as needed.    Marland Kitchen losartan (COZAAR) 50 MG tablet Take 50 mg by mouth daily.  5  . mometasone-formoterol (DULERA) 100-5 MCG/ACT AERO Inhale 2 puffs into the lungs 2 (two) times daily.    . ranitidine (ZANTAC) 150 MG tablet Take 150 mg by mouth 2 (two) times daily.    . tamsulosin (FLOMAX) 0.4 MG CAPS capsule Take 0.4 mg by mouth daily.  5   No current facility-administered medications for this visit.     Allergies as of 09/24/2017 - Review Complete 09/24/2017  Allergen Reaction Noted  . Codeine Itching and Nausea And Vomiting   . Latex Other (See Comments) 03/09/2012  . Lisinopril  05/27/2017  . Morphine Hives   . Adhesive [tape] Rash 03/08/2012    Past Medical History:  Diagnosis Date  . Acute pancreatitis   . Anxiety   . Cannabis abuse   . Complication of anesthesia    talking out of my head  . COPD (chronic obstructive pulmonary disease) (  [place])   . Diverticulitis of colon with perforation 08/2010  . GERD (gastroesophageal reflux disease)   . Hypertension    taken off lisinopril by renal md 08/2011  . Renal insufficiency    follwed by Dr. Hinda Lenis    Past Surgical History:  Procedure Laterality Date  . APPENDECTOMY    . COLON SURGERY  05/2011   [facility]. exp laparotomy with colostomy takedown, lysis of adhesions (3hours), loop ileostomy. complicated by abscess formation requiring percutaneous drainage  . colonoscopy via stoma with endoscopy of [name]  04/2011   [facility]. two sessile polyps in trv colon. complete resection but only partial  retrieval. congested mucosa in area 3cm proximal to stoma, erythematous mucosa in Hartmann pouch. repeat tcs 8/2017path-adenomatous polyp  . ESOPHAGOGASTRODUODENOSCOPY  03/05/2012   erosive reflux esophagitis, bulbar erosions, gastric erosions/petichae, bx showed gastritis, no H.Pylori. Procedure: ESOPHAGOGASTRODUODENOSCOPY (EGD);  Surgeon: Daneil Dolin, MD;  Location: [facility];  Service: Endoscopy;  Laterality: N/A;  11:30  . MULTIPLE EXTRACTIONS WITH ALVEOLOPLASTY  02/23/2012   Procedure: MULTIPLE EXTRACION WITH ALVEOLOPLASTY;  Surgeon: Gae Bon, DDS;  Location: [place];  Service: Oral Surgery;  Laterality: Bilateral;  Removal of left mandibular torus, multiple extractions with alveoloplasty  . reversal of colostomy  10/2011   per patient, did not have report  . right knee surgery    . sigmoid colectomy with end colostomy, Hartmann's pouch  08/2010   [facility]. complicated diverticulitis  . UMBILICAL HERNIA REPAIR      Family History  Problem Relation Age of Onset  . Stomach cancer Father   . Lung cancer Father   . Throat cancer Father   . Liver disease Cousin        cirrhosis, etoh  . Colon cancer Paternal Uncle        >60  . Colon cancer Cousin 67    Social History   Socioeconomic History  . Marital status: Single    Spouse name: Not on file  . Number of children: 0  . Years of education: Not on file  . Highest education level: Not on file  Social Needs  . Financial resource strain: Not on file  . Food insecurity - worry: Not on file  . Food insecurity - inability: Not on file  . Transportation needs - medical: Not on file  . Transportation needs - non-medical: Not on file  Occupational History  . Not on file  Tobacco Use  . Smoking status: Current Every Day Smoker    Packs/day: 1.00    Types: Cigarettes  . Smokeless tobacco: Never Used  Substance and Sexual Activity  . Alcohol use: No    Frequency: Never    Comment: former user quit in 2014  . Drug use: Yes     Types: Marijuana    Comment: occ  . Sexual activity: Not on file  Other Topics Concern  . Not on file  Social History Narrative  . Not on file      ROS:  General: Negative for anorexia, weight loss, fever, chills, fatigue, weakness. Eyes: Negative for vision changes.  ENT: Negative for hoarseness, difficulty swallowing , nasal congestion. CV: Negative for chest pain, angina, palpitations, dyspnea on exertion, peripheral edema.  Respiratory: Negative for dyspnea at rest, dyspnea on exertion, cough, sputum, wheezing.  GI: See history of present illness. GU:  Negative for dysuria, hematuria, urinary incontinence, urinary frequency, nocturnal urination.  MS: Negative for joint pain, low back pain.  Derm: Negative for rash  or itching.  Neuro: Negative for weakness, abnormal sensation, seizure, frequent headaches, memory loss, confusion.  Psych: Negative for anxiety, depression, suicidal ideation, hallucinations.  Endo: Negative for unusual weight change.  Heme: Negative for bruising or bleeding. Allergy: Negative for rash or hives.    Physical Examination:  BP (!) 146/98   Pulse 79   Temp (!) 97.2 F (36.2 C) (Oral)   Ht 5\' 6"  (1.676 m)   Wt 178 lb 9.6 oz (81 kg)   BMI 28.83 kg/m    General: Well-nourished, well-developed in no acute distress.  Head: Normocephalic, atraumatic.   Eyes: Conjunctiva pink, no icterus. Mouth: Oropharyngeal mucosa moist and pink , no lesions erythema or exudate. Neck: Supple without thyromegaly, masses, or lymphadenopathy.  Lungs: Clear to auscultation bilaterally.  Heart: Regular rate and rhythm, no murmurs rubs or gallops.  Abdomen: Bowel sounds are normal, extensive ventral vertical scar without evidence of herniation, scar noted in right midabdomen from the ostomy and left mid to lower abdomen from colostomy both with moderate tenderness to palpation, no obvious hernia.  Moderate tenderness in the right upper quadrant with deep palpation.   nondistended, no hepatosplenomegaly or masses, no abdominal bruits , no rebound or guarding.   Rectal: Not performed Extremities: No lower extremity edema. No clubbing or deformities.  Neuro: Alert and oriented x 4 , grossly normal neurologically.  Skin: Warm and dry, no rash or jaundice.   Psych: Alert and cooperative, normal mood and affect.    Imaging Studies: No results found.  Impression/plan:  52 year old gentleman with extensive surgical history as outlined above presenting for abdominal pain in the right upper quadrant and at site of previous ileostomy/colostomy/ventral hernia repair.  Pain is chronic with acute on chronic features at times.  No associated vomiting.  Bowels continue to move regularly.  Question pain related to adhesions, internal herniation.  He is overdue for surveillance colonoscopy for history of adenomatous colon polyps as well as has solid food esophageal dysphagia with history of prior erosive reflux esophagitis.  Typical reflux symptoms fairly well controlled on H2 blockers right now.  Other concerning features are his lack of appetite and weight loss of approximately 15 pounds in the last 2 years.  This is well documented.  Initially obtain labs, CT abdomen pelvis with contrast.  Based on findings, he will eventually need colonoscopy with upper endoscopy/esophageal dilation with deep sedation/propofol.  Patient inquired about pain medication, declined given the chronicity of his symptoms.  He will need to follow-up with PCP for pain medication and/or pain clinic.

## 2017-10-05 ENCOUNTER — Ambulatory Visit (HOSPITAL_COMMUNITY)
Admission: RE | Admit: 2017-10-05 | Discharge: 2017-10-05 | Disposition: A | Discharge status: Home / Self Care | Payer: Self-pay | Source: Ambulatory Visit | Attending: Gastroenterology | Admitting: Gastroenterology

## 2017-10-05 DIAGNOSIS — I700 Atherosclerosis of aorta: Secondary | ICD-10-CM | POA: Insufficient documentation

## 2017-10-05 DIAGNOSIS — Z9049 Acquired absence of other specified parts of digestive tract: Secondary | ICD-10-CM | POA: Insufficient documentation

## 2017-10-05 DIAGNOSIS — R1011 Right upper quadrant pain: Secondary | ICD-10-CM | POA: Insufficient documentation

## 2017-10-05 DIAGNOSIS — I771 Stricture of artery: Secondary | ICD-10-CM | POA: Insufficient documentation

## 2017-10-05 MED ORDER — IOPAMIDOL (ISOVUE-300) INJECTION 61%
100.0000 mL | Freq: Once | INTRAVENOUS | Status: AC | PRN
  Administered 2017-10-05: 100 mL via INTRAVENOUS

## 2017-10-05 NOTE — Progress Notes (Signed)
minimally elevated lipase,nonspecific.  Mildly elevated creatinine, near baseline.  Await CT.

## 2017-11-02 NOTE — Progress Notes (Signed)
Labs from 06/2017 outside source. White blood cell count 7400, hemoglobin 16.2, hematocrit 45.8, platelets 216,000, glucose 55, BUN 22, creatinine 1.14, total bilirubin 0.3, alkaline phosphatase 72, AST 14, ALT 11, albumin 4.3.

## 2017-11-02 NOTE — Progress Notes (Signed)
Please let patient know I'm sorry for delay in getting results to him. I've been on vacation and then Dr. Gala Romney on vacation. I still need to review CT with him but overall no acute findings. Evidence of prior colon surgeries. Likely left kidney cyst.   Please offer him a colonoscopy (h/o polyps/brbpr) and EGD/ED (weight loss, dysphagia/ruq pain) with RMR with PROPROFOL. Once scheduled, please bring him in for updated H+P no charge prior to his procedure date.

## 2017-11-11 ENCOUNTER — Other Ambulatory Visit: Payer: Self-pay

## 2017-11-11 DIAGNOSIS — K625 Hemorrhage of anus and rectum: Secondary | ICD-10-CM

## 2017-11-11 DIAGNOSIS — Z86010 Personal history of colonic polyps: Secondary | ICD-10-CM

## 2017-11-11 DIAGNOSIS — R634 Abnormal weight loss: Secondary | ICD-10-CM

## 2017-11-11 DIAGNOSIS — R1310 Dysphagia, unspecified: Secondary | ICD-10-CM

## 2017-11-11 DIAGNOSIS — R1011 Right upper quadrant pain: Secondary | ICD-10-CM

## 2017-11-30 ENCOUNTER — Ambulatory Visit (INDEPENDENT_AMBULATORY_CARE_PROVIDER_SITE_OTHER): Payer: Self-pay | Admitting: Gastroenterology

## 2017-11-30 ENCOUNTER — Encounter: Payer: Self-pay | Admitting: Gastroenterology

## 2017-11-30 VITALS — BP 136/89 | HR 74 | Temp 96.8°F | Ht 67.0 in | Wt 184.4 lb

## 2017-11-30 DIAGNOSIS — R1310 Dysphagia, unspecified: Secondary | ICD-10-CM

## 2017-11-30 DIAGNOSIS — K219 Gastro-esophageal reflux disease without esophagitis: Secondary | ICD-10-CM

## 2017-11-30 DIAGNOSIS — R1011 Right upper quadrant pain: Secondary | ICD-10-CM

## 2017-11-30 DIAGNOSIS — R1319 Other dysphagia: Secondary | ICD-10-CM

## 2017-11-30 DIAGNOSIS — K921 Melena: Secondary | ICD-10-CM

## 2017-11-30 DIAGNOSIS — Z86010 Personal history of colonic polyps: Secondary | ICD-10-CM

## 2017-11-30 NOTE — Patient Instructions (Signed)
1. Colonoscopy and upper endoscopy as scheduled. See separate instructions.  

## 2017-11-30 NOTE — Progress Notes (Signed)
Discussed CT with patient. Reviewed left renal lesion with dr. Thornton Papas. Tiny cyst. No further work up needed.

## 2017-11-30 NOTE — Progress Notes (Addendum)
Primary Care Physician:  Wyatt Haste, NP  Primary Gastroenterologist:  Garfield Cornea, MD   Chief Complaint  Patient presents with  . colonoscopy and upper endoscopy    HPI:  [name] is a 52 y.o. male here to update H+P prior to upcoming colonoscopy and upper endoscopy. He was seen in 09/2017 for RUQ pain, h/o diverticulitis at request of [facility], Rosealee Albee, NP.   He has h/o chronic abdominal pain. He had ventral hernia repair with mesh in 2010 by Dr. Burke Keels. In 2011 he had perforated diverticulitis requiring resection and colostomy bag.  He was in prison at the time per patient for DUI.  He went back for colostomy reversal around 2012 according to the patient surgery was complicated by injury to the colon requiring an ileostomy.  Ultimately he had the ileostomy reversed.  Patient's last colonoscopy was in August 2012 at [facility], performed via stoma.  2 sessile polyps in the transverse colon removed/partial retrieval, congested mucosa in the area 3 cm proximal to the stoma, erythematous mucosa in the Hartmann pouch.  Repeat colonoscopy recommended in August 2017 for adenomatous colon polyps.  Patient complains of poor appetite. Weight is up and down, states anxiety medication has helped his appetite in the past but he can't get any from his PCP. Doesn't want to follow up with mental health due to transportation concerns. He has bad reflux which was well controlled in the past on Dexilant but he cannot afford. He is taking zantac 300mg  1-2 times daily. Complains of solid food dysphagia. No vomiting. BMs 3-4 per day. Occasional constipation but not lately. No melena. Some intermittent brbpr.    CT A/P with contrast 10/05/17 to evaluate his ruq pain showed left upper pole subcentimeter low-denisty structure probably a cyst, prior partial colectomy with anastomosis rectosigmoid region. Prior Hartmann's pouch right lower quadrant with residual prominent small bowel loop  at this level. Prior umb henria repair.   I personally reviewed CT with Dr. Thornton Papas, renal lesion is a cyst and does not need further work up.   Current Outpatient Medications  Medication Sig Dispense Refill  . albuterol (PROVENTIL HFA;VENTOLIN HFA) 108 (90 Base) MCG/ACT inhaler Inhale 2 puffs into the lungs every 6 (six) hours as needed for wheezing or shortness of breath.    . cyclobenzaprine (FLEXERIL) 5 MG tablet Take 1 tablet by mouth as needed.    . hydrOXYzine (ATARAX/VISTARIL) 50 MG tablet Take 50 mg by mouth every 6 (six) hours as needed.    Marland Kitchen losartan (COZAAR) 50 MG tablet Take 50 mg by mouth daily.  5  . mometasone-formoterol (DULERA) 100-5 MCG/ACT AERO Inhale 2 puffs into the lungs 2 (two) times daily.    . ranitidine (ZANTAC) 150 MG tablet Take 150 mg by mouth 2 (two) times daily.    . tamsulosin (FLOMAX) 0.4 MG CAPS capsule Take 0.4 mg by mouth daily.  5   No current facility-administered medications for this visit.     Allergies as of 11/30/2017 - Review Complete 11/30/2017  Allergen Reaction Noted  . Codeine Itching and Nausea And Vomiting   . Latex Other (See Comments) 03/09/2012  . Lisinopril  05/27/2017  . Morphine Hives   . Adhesive [tape] Rash 03/08/2012    Past Medical History:  Diagnosis Date  . Acute pancreatitis   . Anxiety   . Cannabis abuse   . Complication of anesthesia    talking out of my head  . COPD (chronic obstructive  pulmonary disease) ([place])   . Diverticulitis of colon with perforation 08/2010  . GERD (gastroesophageal reflux disease)   . Hypertension    taken off lisinopril by renal md 08/2011  . Renal insufficiency    follwed by Dr. Hinda Lenis    Past Surgical History:  Procedure Laterality Date  . APPENDECTOMY    . COLON SURGERY  05/2011   [facility]. exp laparotomy with colostomy takedown, lysis of adhesions (3hours), loop ileostomy. complicated by abscess formation requiring percutaneous drainage  . colonoscopy via stoma with endoscopy of  [name]  04/2011   [facility]. two sessile polyps in trv colon. complete resection but only partial retrieval. congested mucosa in area 3cm proximal to stoma, erythematous mucosa in Hartmann pouch. repeat tcs 8/2017path-adenomatous polyp  . ESOPHAGOGASTRODUODENOSCOPY  03/05/2012   erosive reflux esophagitis, bulbar erosions, gastric erosions/petichae, bx showed gastritis, no H.Pylori. Procedure: ESOPHAGOGASTRODUODENOSCOPY (EGD);  Surgeon: Daneil Dolin, MD;  Location: [facility];  Service: Endoscopy;  Laterality: N/A;  11:30  . MULTIPLE EXTRACTIONS WITH ALVEOLOPLASTY  02/23/2012   Procedure: MULTIPLE EXTRACION WITH ALVEOLOPLASTY;  Surgeon: Gae Bon, DDS;  Location: [facility];  Service: Oral Surgery;  Laterality: Bilateral;  Removal of left mandibular torus, multiple extractions with alveoloplasty  . reversal of colostomy  10/2011   per patient, did not have report  . right knee surgery    . sigmoid colectomy with end colostomy, Hartmann's pouch  08/2010   [facility]. complicated diverticulitis  . UMBILICAL HERNIA REPAIR      Family History  Problem Relation Age of Onset  . Stomach cancer Father   . Lung cancer Father   . Throat cancer Father   . Liver disease Cousin        cirrhosis, etoh  . Colon cancer Paternal Uncle        >60  . Colon cancer Cousin 52    Social History   Socioeconomic History  . Marital status: Single    Spouse name: Not on file  . Number of children: 0  . Years of education: Not on file  . Highest education level: Not on file  Occupational History  . Not on file  Social Needs  . Financial resource strain: Not on file  . Food insecurity:    Worry: Not on file    Inability: Not on file  . Transportation needs:    Medical: Not on file    Non-medical: Not on file  Tobacco Use  . Smoking status: Current Every Day Smoker    Packs/day: 1.00    Types: Cigarettes  . Smokeless tobacco: Never Used  Substance and Sexual Activity  . Alcohol use: No     Frequency: Never    Comment: former user quit in 2014  . Drug use: Yes    Types: Marijuana    Comment: occ  . Sexual activity: Not on file  Lifestyle  . Physical activity:    Days per week: Not on file    Minutes per session: Not on file  . Stress: Not on file  Relationships  . Social connections:    Talks on phone: Not on file    Gets together: Not on file    Attends religious service: Not on file    Active member of club or organization: Not on file    Attends meetings of clubs or organizations: Not on file    Relationship status: Not on file  . Intimate partner violence:    Fear of current or  ex partner: Not on file    Emotionally abused: Not on file    Physically abused: Not on file    Forced sexual activity: Not on file  Other Topics Concern  . Not on file  Social History Narrative  . Not on file      ROS:  General: Negative for anorexia,   fever, chills, fatigue, weakness. Weight fluctuates from 190 to 178, currently 184.  Eyes: Negative for vision changes.  ENT: Negative for hoarseness,  nasal congestion. See hpi CV: Negative for chest pain, angina, palpitations, dyspnea on exertion, peripheral edema.  Respiratory: Negative for dyspnea at rest, dyspnea on exertion, cough, sputum, wheezing.  GI: See history of present illness. GU:  Negative for dysuria, hematuria, urinary incontinence, urinary frequency, nocturnal urination.  MS: Negative for joint pain, low back pain.  Derm: Negative for rash or itching.  Neuro: Negative for weakness, abnormal sensation, seizure, frequent headaches, memory loss, confusion.  Psych: Negative for anxiety, depression, suicidal ideation, hallucinations.  Endo: Negative for unusual weight change.  Heme: Negative for bruising or bleeding. Allergy: Negative for rash or hives.    Physical Examination:  BP 136/89   Pulse 74   Temp (!) 96.8 F (36 C) (Oral)   Ht 5\' 7"  (1.702 m)   Wt 184 lb 6.4 oz (83.6 kg)   BMI 28.88 kg/m     General: Well-nourished, well-developed in no acute distress.  Head: Normocephalic, atraumatic.   Eyes: Conjunctiva pink, no icterus. Mouth: Oropharyngeal mucosa moist and pink , no lesions erythema or exudate. Neck: Supple without thyromegaly, masses, or lymphadenopathy.  Lungs: Clear to auscultation bilaterally.  Heart: Regular rate and rhythm, no murmurs rubs or gallops.  Abdomen: Bowel sounds are normal,   nondistended, no hepatosplenomegaly or masses, no abdominal bruits or    hernia , no rebound or guarding.  ruq moderately tender. Multiple well healed incisions. Right of umb, sharp palpable item under the skip at site of hernia, ?suture or mesh Rectal: not performed Extremities: No lower extremity edema. No clubbing or deformities.  Neuro: Alert and oriented x 4 , grossly normal neurologically.  Skin: Warm and dry, no rash or jaundice.   Psych: Alert and cooperative, normal mood and affect.  Labs: Lab Results  Component Value Date   CREATININE 1.34 (H) 09/24/2017   BUN 21 (H) 09/24/2017   NA 135 09/24/2017   K 4.4 09/24/2017   CL 104 09/24/2017   CO2 22 09/24/2017   Lab Results  Component Value Date   ALT 15 (L) 09/24/2017   AST 19 09/24/2017   ALKPHOS 70 09/24/2017   BILITOT 0.6 09/24/2017   Lab Results  Component Value Date   WBC 9.1 09/24/2017   HGB 16.8 09/24/2017   HCT 49.7 09/24/2017   MCV 91.7 09/24/2017   PLT 215 09/24/2017   Lab Results  Component Value Date   LIPASE 53 (H) 09/24/2017     Imaging Studies: No results found.  Impression/Plan:  52 year old gentleman presenting to schedule his colonoscopy and upper endoscopy with possible esophageal dilation.  He has a history of right upper quadrant pain in the setting of multiple abdominal surgeries as outlined above.  Also notes pulling and tugging at site of his prior umbilical hernia repair/mesh.  He is overdue for colonoscopy for history of colon polyps and has intermittent bright red blood per  rectum.  GERD is fairly well controlled on ranitidine but previously better on PPI.  Unfortunately his medications have been limited by  [facility].  He has intermittent poor appetite and weight tends to fluctuate with this.  Had loss some weight prior to his last visit but has gained some of that back.  Ongoing solid food dysphagia.  Plan for colonoscopy/EGD/EGD with propofol in the near future.  I have discussed the risks, alternatives, benefits with regards to but not limited to the risk of reaction to medication, bleeding, infection, perforation and the patient is agreeable to proceed. Written consent to be obtained.     12/14/2017  No change. Persisting dysphagia. History of colonic polyps.  Here for EGD with esophageal dilation and colonoscopy per plan.   The risks, benefits, limitations, imponderables and alternatives regarding both EGD and colonoscopy have been reviewed with the patient. Questions have been answered. All parties agreeable.

## 2017-12-03 NOTE — Patient Instructions (Signed)
[name]  12/03/2017     @PREFPERIOPPHARMACY @   Your procedure is scheduled on  12/14/2017 .  Report to [facility] at  1000  A.M.  Call this number if you have problems the morning of surgery:  [phone]   Remember:  Do not eat food or drink liquids after midnight.  Take these medicines the morning of surgery with A SIP OF WATER  Flexaril, losartan, zantac, flomax. Use your inhaler before you come.   Do not wear jewelry, make-up or nail polish.  Do not wear lotions, powders, or perfumes, or deodorant.  Do not shave 48 hours prior to surgery.  Men may shave face and neck.  Do not bring valuables to the hospital.  [facility] is not responsible for any belongings or valuables.  Contacts, dentures or bridgework may not be worn into surgery.  Leave your suitcase in the car.  After surgery it may be brought to your room.  For patients admitted to the hospital, discharge time will be determined by your treatment team.  Patients discharged the day of surgery will not be allowed to drive home.   Name and phone number of your driver:   family Special instructions:  Follow the diet and prep instructions given to you by Dr Roseanne Kaufman office.  Please read over the following fact sheets that you were given. Anesthesia Post-op Instructions and Care and Recovery After Surgery       Esophagogastroduodenoscopy Esophagogastroduodenoscopy (EGD) is a procedure to examine the lining of the esophagus, stomach, and first part of the small intestine (duodenum). This procedure is done to check for problems such as inflammation, bleeding, ulcers, or growths. During this procedure, a long, flexible, lighted tube with a camera attached (endoscope) is inserted down the throat. Tell a health care provider about:  Any allergies you have.  All medicines you are taking, including vitamins, herbs, eye drops, creams, and over-the-counter medicines.  Any problems you or  family members have had with anesthetic medicines.  Any blood disorders you have.  Any surgeries you have had.  Any medical conditions you have.  Whether you are pregnant or may be pregnant. What are the risks? Generally, this is a safe procedure. However, problems may occur, including:  Infection.  Bleeding.  A tear (perforation) in the esophagus, stomach, or duodenum.  Trouble breathing.  Excessive sweating.  Spasms of the larynx.  A slowed heartbeat.  Low blood pressure.  What happens before the procedure?  Follow instructions from your health care provider about eating or drinking restrictions.  Ask your health care provider about: ? Changing or stopping your regular medicines. This is especially important if you are taking diabetes medicines or blood thinners. ? Taking medicines such as aspirin and ibuprofen. These medicines can thin your blood. Do not take these medicines before your procedure if your health care provider instructs you not to.  Plan to have someone take you home after the procedure.  If you wear dentures, be ready to remove them before the procedure. What happens during the procedure?  To reduce your risk of infection, your health care team will wash or sanitize their hands.  An IV tube will be put in a vein in your hand or arm. You will get medicines and fluids through this tube.  You will be given one or more of the following: ? A medicine to help you relax (sedative). ?  A medicine to numb the area (local anesthetic). This medicine may be sprayed into your throat. It will make you feel more comfortable and keep you from gagging or coughing during the procedure. ? A medicine for pain.  A mouth guard may be placed in your mouth to protect your teeth and to keep you from biting on the endoscope.  You will be asked to lie on your left side.  The endoscope will be lowered down your throat into your esophagus, stomach, and duodenum.  Air will  be put into the endoscope. This will help your health care provider see better.  The lining of your esophagus, stomach, and duodenum will be examined.  Your health care provider may: ? Take a tissue sample so it can be looked at in a lab (biopsy). ? Remove growths. ? Remove objects (foreign bodies) that are stuck. ? Treat any bleeding with medicines or other devices that stop tissue from bleeding. ? Widen (dilate) or stretch narrowed areas of your esophagus and stomach.  The endoscope will be taken out. The procedure may vary among health care providers and hospitals. What happens after the procedure?  Your blood pressure, heart rate, breathing rate, and blood oxygen level will be monitored often until the medicines you were given have worn off.  Do not eat or drink anything until the numbing medicine has worn off and your gag reflex has returned. This information is not intended to replace advice given to you by your health care provider. Make sure you discuss any questions you have with your health care provider. Document Released: 12/26/2004 Document Revised: 01/31/2016 Document Reviewed: 07/19/2015 Elsevier Interactive Patient Education  2018 [employer]. Esophagogastroduodenoscopy, Care After Refer to this sheet in the next few weeks. These instructions provide you with information about caring for yourself after your procedure. Your health care provider may also give you more specific instructions. Your treatment has been planned according to current medical practices, but problems sometimes occur. Call your health care provider if you have any problems or questions after your procedure. What can I expect after the procedure? After the procedure, it is common to have:  A sore throat.  Nausea.  Bloating.  Dizziness.  Fatigue.  Follow these instructions at home:  Do not eat or drink anything until the numbing medicine (local anesthetic) has worn off and your gag reflex has  returned. You will know that the local anesthetic has worn off when you can swallow comfortably.  Do not drive for 24 hours if you received a medicine to help you relax (sedative).  If your health care provider took a tissue sample for testing during the procedure, make sure to get your test results. This is your responsibility. Ask your health care provider or the department performing the test when your results will be ready.  Keep all follow-up visits as told by your health care provider. This is important. Contact a health care provider if:  You cannot stop coughing.  You are not urinating.  You are urinating less than usual. Get help right away if:  You have trouble swallowing.  You cannot eat or drink.  You have throat or chest pain that gets worse.  You are dizzy or light-headed.  You faint.  You have nausea or vomiting.  You have chills.  You have a fever.  You have severe abdominal pain.  You have black, tarry, or bloody stools. This information is not intended to replace advice given to you by your health  care provider. Make sure you discuss any questions you have with your health care provider. Document Released: 08/11/2012 Document Revised: 01/31/2016 Document Reviewed: 07/19/2015 Elsevier Interactive Patient Education  2018 [employer].  Esophageal Dilatation Esophageal dilatation is a procedure to open a blocked or narrowed part of the esophagus. The esophagus is the long tube in your throat that carries food and liquid from your mouth to your stomach. The procedure is also called esophageal dilation. You may need this procedure if you have a buildup of scar tissue in your esophagus that makes it difficult, painful, or even impossible to swallow. This can be caused by gastroesophageal reflux disease (GERD). In rare cases, people need this procedure because they have cancer of the esophagus or a problem with the way food moves through the esophagus. Sometimes  you may need to have another dilatation to enlarge the opening of the esophagus gradually. Tell a health care provider about:  Any allergies you have.  All medicines you are taking, including vitamins, herbs, eye drops, creams, and over-the-counter medicines.  Any problems you or family members have had with anesthetic medicines.  Any blood disorders you have.  Any surgeries you have had.  Any medical conditions you have.  Any antibiotic medicines you are required to take before dental procedures. What are the risks? Generally, this is a safe procedure. However, problems can occur and include:  Bleeding from a tear in the lining of the esophagus.  A hole (perforation) in the esophagus.  What happens before the procedure?  Do not eat or drink anything after midnight on the night before the procedure or as directed by your health care provider.  Ask your health care provider about changing or stopping your regular medicines. This is especially important if you are taking diabetes medicines or blood thinners.  Plan to have someone take you home after the procedure. What happens during the procedure?  You will be given a medicine that makes you relaxed and sleepy (sedative).  A medicine may be sprayed or gargled to numb the back of the throat.  Your health care provider can use various instruments to do an esophageal dilatation. During the procedure, the instrument used will be placed in your mouth and passed down into your esophagus. Options include: ? Simple dilators. This instrument is carefully placed in the esophagus to stretch it. ? Guided wire bougies. In this method, a flexible tube (endoscope) is used to insert a wire into the esophagus. The dilator is passed over this wire to enlarge the esophagus. Then the wire is removed. ? Balloon dilators. An endoscope with a small balloon at the end is passed down into the esophagus. Inflating the balloon gently stretches the  esophagus and opens it up. What happens after the procedure?  Your blood pressure, heart rate, breathing rate, and blood oxygen level will be monitored often until the medicines you were given have worn off.  Your throat may feel slightly sore and will probably still feel numb. This will improve slowly over time.  You will not be allowed to eat or drink until the throat numbness has resolved.  If this is a same-day procedure, you may be allowed to go home once you have been able to drink, urinate, and sit on the edge of the bed without nausea or dizziness.  If this is a same-day procedure, you should have a friend or family member with you for the next 24 hours after the procedure. This information is not intended to  replace advice given to you by your health care provider. Make sure you discuss any questions you have with your health care provider. Document Released: 10/16/2005 Document Revised: 01/31/2016 Document Reviewed: 01/04/2014 Elsevier Interactive Patient Education  [employer].  Colonoscopy, Adult A colonoscopy is an exam to look at the large intestine. It is done to check for problems, such as:  Lumps (tumors).  Growths (polyps).  Swelling (inflammation).  Bleeding.  What happens before the procedure? Eating and drinking Follow instructions from your doctor about eating and drinking. These instructions may include:  A few days before the procedure - follow a low-fiber diet. ? Avoid nuts. ? Avoid seeds. ? Avoid dried fruit. ? Avoid raw fruits. ? Avoid vegetables.  1-3 days before the procedure - follow a clear liquid diet. Avoid liquids that have red or purple dye. Drink only clear liquids, such as: ? Clear broth or bouillon. ? Black coffee or tea. ? Clear juice. ? Clear soft drinks or sports drinks. ? Gelatin dessert. ? Popsicles.  On the day of the procedure - do not eat or drink anything during the 2 hours before the procedure.  Bowel prep If you  were prescribed an oral bowel prep:  Take it as told by your doctor. Starting the day before your procedure, you will need to drink a lot of liquid. The liquid will cause you to poop (have bowel movements) until your poop is almost clear or light green.  If your skin or butt gets irritated from diarrhea, you may: ? Wipe the area with wipes that have medicine in them, such as adult wet wipes with aloe and vitamin E. ? Put something on your skin that soothes the area, such as petroleum jelly.  If you throw up (vomit) while drinking the bowel prep, take a break for up to 60 minutes. Then begin the bowel prep again. If you keep throwing up and you cannot take the bowel prep without throwing up, call your doctor.  General instructions  Ask your doctor about changing or stopping your normal medicines. This is important if you take diabetes medicines or blood thinners.  Plan to have someone take you home from the hospital or clinic. What happens during the procedure?  An IV tube may be put into one of your veins.  You will be given medicine to help you relax (sedative).  To reduce your risk of infection: ? Your doctors will wash their hands. ? Your anal area will be washed with soap.  You will be asked to lie on your side with your knees bent.  Your doctor will get a long, thin, flexible tube ready. The tube will have a camera and a light on the end.  The tube will be put into your anus.  The tube will be gently put into your large intestine.  Air will be delivered into your large intestine to keep it open. You may feel some pressure or cramping.  The camera will be used to take photos.  A small tissue sample may be removed from your body to be looked at under a microscope (biopsy). If any possible problems are found, the tissue will be sent to a lab for testing.  If small growths are found, your doctor may remove them and have them checked for cancer.  The tube that was put into  your anus will be slowly removed. The procedure may vary among doctors and hospitals. What happens after the procedure?  Your doctor will check  on you often until the medicines you were given have worn off.  Do not drive for 24 hours after the procedure.  You may have a small amount of blood in your poop.  You may pass gas.  You may have mild cramps or bloating in your belly (abdomen).  It is up to you to get the results of your procedure. Ask your doctor, or the department performing the procedure, when your results will be ready. This information is not intended to replace advice given to you by your health care provider. Make sure you discuss any questions you have with your health care provider. Document Released: 09/27/2010 Document Revised: 06/25/2016 Document Reviewed: 11/06/2015 Elsevier Interactive Patient Education  2017 [employer]  Colonoscopy, Adult, Care After This sheet gives you information about how to care for yourself after your procedure. Your health care provider may also give you more specific instructions. If you have problems or questions, contact your health care provider. What can I expect after the procedure? After the procedure, it is common to have:  A small amount of blood in your stool for 24 hours after the procedure.  Some gas.  Mild abdominal cramping or bloating.  Follow these instructions at home: General instructions   For the first 24 hours after the procedure: ? Do not drive or use machinery. ? Do not sign important documents. ? Do not drink alcohol. ? Do your regular daily activities at a slower pace than normal. ? Eat soft, easy-to-digest foods. ? Rest often.  Take over-the-counter or prescription medicines only as told by your health care provider.  It is up to you to get the results of your procedure. Ask your health care provider, or the department performing the procedure, when your results will be ready. Relieving cramping  and bloating  Try walking around when you have cramps or feel bloated.  Apply heat to your abdomen as told by your health care provider. Use a heat source that your health care provider recommends, such as a moist heat pack or a heating pad. ? Place a towel between your skin and the heat source. ? Leave the heat on for 20-30 minutes. ? Remove the heat if your skin turns bright red. This is especially important if you are unable to feel pain, heat, or cold. You may have a greater risk of getting burned. Eating and drinking  Drink enough fluid to keep your urine clear or pale yellow.  Resume your normal diet as instructed by your health care provider. Avoid heavy or fried foods that are hard to digest.  Avoid drinking alcohol for as long as instructed by your health care provider. Contact a health care provider if:  You have blood in your stool 2-3 days after the procedure. Get help right away if:  You have more than a small spotting of blood in your stool.  You pass large blood clots in your stool.  Your abdomen is swollen.  You have nausea or vomiting.  You have a fever.  You have increasing abdominal pain that is not relieved with medicine. This information is not intended to replace advice given to you by your health care provider. Make sure you discuss any questions you have with your health care provider. Document Released: 04/08/2004 Document Revised: 05/19/2016 Document Reviewed: 11/06/2015 Elsevier Interactive Patient Education  2018 [place] Anesthesia is a term that refers to techniques, procedures, and medicines that help a person stay safe and comfortable during  a medical procedure. Monitored anesthesia care, or sedation, is one type of anesthesia. Your anesthesia specialist may recommend sedation if you will be having a procedure that does not require you to be unconscious, such as:  Cataract surgery.  A dental procedure.  A  biopsy.  A colonoscopy.  During the procedure, you may receive a medicine to help you relax (sedative). There are three levels of sedation:  Mild sedation. At this level, you may feel awake and relaxed. You will be able to follow directions.  Moderate sedation. At this level, you will be sleepy. You may not remember the procedure.  Deep sedation. At this level, you will be asleep. You will not remember the procedure.  The more medicine you are given, the deeper your level of sedation will be. Depending on how you respond to the procedure, the anesthesia specialist may change your level of sedation or the type of anesthesia to fit your needs. An anesthesia specialist will monitor you closely during the procedure. Let your health care provider know about:  Any allergies you have.  All medicines you are taking, including vitamins, herbs, eye drops, creams, and over-the-counter medicines.  Any use of steroids (by mouth or as a cream).  Any problems you or family members have had with sedatives and anesthetic medicines.  Any blood disorders you have.  Any surgeries you have had.  Any medical conditions you have, such as sleep apnea.  Whether you are pregnant or may be pregnant.  Any use of cigarettes, alcohol, or street drugs. What are the risks? Generally, this is a safe procedure. However, problems may occur, including:  Getting too much medicine (oversedation).  Nausea.  Allergic reaction to medicines.  Trouble breathing. If this happens, a breathing tube may be used to help with breathing. It will be removed when you are awake and breathing on your own.  Heart trouble.  Lung trouble.  Before the procedure Staying hydrated Follow instructions from your health care provider about hydration, which may include:  Up to 2 hours before the procedure - you may continue to drink clear liquids, such as water, clear fruit juice, black coffee, and plain tea.  Eating and  drinking restrictions Follow instructions from your health care provider about eating and drinking, which may include:  8 hours before the procedure - stop eating heavy meals or foods such as meat, fried foods, or fatty foods.  6 hours before the procedure - stop eating light meals or foods, such as toast or cereal.  6 hours before the procedure - stop drinking milk or drinks that contain milk.  2 hours before the procedure - stop drinking clear liquids.  Medicines Ask your health care provider about:  Changing or stopping your regular medicines. This is especially important if you are taking diabetes medicines or blood thinners.  Taking medicines such as aspirin and ibuprofen. These medicines can thin your blood. Do not take these medicines before your procedure if your health care provider instructs you not to.  Tests and exams  You will have a physical exam.  You may have blood tests done to show: ? How well your kidneys and liver are working. ? How well your blood can clot.  General instructions  Plan to have someone take you home from the hospital or clinic.  If you will be going home right after the procedure, plan to have someone with you for 24 hours.  What happens during the procedure?  Your blood pressure, heart rate,  breathing, level of pain and overall condition will be monitored.  An IV tube will be inserted into one of your veins.  Your anesthesia specialist will give you medicines as needed to keep you comfortable during the procedure. This may mean changing the level of sedation.  The procedure will be performed. After the procedure  Your blood pressure, heart rate, breathing rate, and blood oxygen level will be monitored until the medicines you were given have worn off.  Do not drive for 24 hours if you received a sedative.  You may: ? Feel sleepy, clumsy, or nauseous. ? Feel forgetful about what happened after the procedure. ? Have a sore throat if  you had a breathing tube during the procedure. ? Vomit. This information is not intended to replace advice given to you by your health care provider. Make sure you discuss any questions you have with your health care provider. Document Released: 05/21/2005 Document Revised: 02/01/2016 Document Reviewed: 12/16/2015 Elsevier Interactive Patient Education  2018 [place], Care After These instructions provide you with information about caring for yourself after your procedure. Your health care provider may also give you more specific instructions. Your treatment has been planned according to current medical practices, but problems sometimes occur. Call your health care provider if you have any problems or questions after your procedure. What can I expect after the procedure? After your procedure, it is common to:  Feel sleepy for several hours.  Feel clumsy and have poor balance for several hours.  Feel forgetful about what happened after the procedure.  Have poor judgment for several hours.  Feel nauseous or vomit.  Have a sore throat if you had a breathing tube during the procedure.  Follow these instructions at home: For at least 24 hours after the procedure:   Do not: ? Participate in activities in which you could fall or become injured. ? Drive. ? Use heavy machinery. ? Drink alcohol. ? Take sleeping pills or medicines that cause drowsiness. ? Make important decisions or sign legal documents. ? Take care of children on your own.  Rest. Eating and drinking  Follow the diet that is recommended by your health care provider.  If you vomit, drink water, juice, or soup when you can drink without vomiting.  Make sure you have little or no nausea before eating solid foods. General instructions  Have a responsible adult stay with you until you are awake and alert.  Take over-the-counter and prescription medicines only as told by your health care  provider.  If you smoke, do not smoke without supervision.  Keep all follow-up visits as told by your health care provider. This is important. Contact a health care provider if:  You keep feeling nauseous or you keep vomiting.  You feel light-headed.  You develop a rash.  You have a fever. Get help right away if:  You have trouble breathing. This information is not intended to replace advice given to you by your health care provider. Make sure you discuss any questions you have with your health care provider. Document Released: 12/16/2015 Document Revised: 04/16/2016 Document Reviewed: 12/16/2015 Elsevier Interactive Patient Education  [employer].

## 2017-12-08 ENCOUNTER — Encounter (HOSPITAL_COMMUNITY)
Admission: RE | Admit: 2017-12-08 | Discharge: 2017-12-08 | Disposition: A | Discharge status: Home / Self Care | Payer: Self-pay | Source: Ambulatory Visit | Attending: Internal Medicine | Admitting: Internal Medicine

## 2017-12-08 ENCOUNTER — Encounter (HOSPITAL_COMMUNITY): Payer: Self-pay

## 2017-12-08 ENCOUNTER — Other Ambulatory Visit: Payer: Self-pay

## 2017-12-08 DIAGNOSIS — I4510 Unspecified right bundle-branch block: Secondary | ICD-10-CM | POA: Insufficient documentation

## 2017-12-08 DIAGNOSIS — Z01818 Encounter for other preprocedural examination: Secondary | ICD-10-CM | POA: Insufficient documentation

## 2017-12-08 DIAGNOSIS — Z01812 Encounter for preprocedural laboratory examination: Secondary | ICD-10-CM | POA: Insufficient documentation

## 2017-12-08 HISTORY — DX: Depression, unspecified: F32.A

## 2017-12-08 HISTORY — DX: Sleep apnea, unspecified: G47.30

## 2017-12-08 HISTORY — DX: Malignant (primary) neoplasm, unspecified: C80.1

## 2017-12-08 HISTORY — DX: Dyspnea, unspecified: R06.00

## 2017-12-08 HISTORY — DX: Major depressive disorder, single episode, unspecified: F32.9

## 2017-12-08 HISTORY — DX: Personal history of other diseases of the digestive system: Z87.19

## 2017-12-08 HISTORY — DX: Unspecified osteoarthritis, unspecified site: M19.90

## 2017-12-08 LAB — CBC WITH DIFFERENTIAL/PLATELET
BASOS PCT: 0 %
Basophils Absolute: 0 10*3/uL (ref 0.0–0.1)
Eosinophils Absolute: 0.1 10*3/uL (ref 0.0–0.7)
Eosinophils Relative: 1 %
HEMATOCRIT: 49.5 % (ref 39.0–52.0)
HEMOGLOBIN: 16.5 g/dL (ref 13.0–17.0)
LYMPHS ABS: 2.5 10*3/uL (ref 0.7–4.0)
Lymphocytes Relative: 29 %
MCH: 31 pg (ref 26.0–34.0)
MCHC: 33.3 g/dL (ref 30.0–36.0)
MCV: 92.9 fL (ref 78.0–100.0)
MONOS PCT: 6 %
Monocytes Absolute: 0.5 10*3/uL (ref 0.1–1.0)
NEUTROS ABS: 5.5 10*3/uL (ref 1.7–7.7)
NEUTROS PCT: 64 %
Platelets: 223 10*3/uL (ref 150–400)
RBC: 5.33 MIL/uL (ref 4.22–5.81)
RDW: 13.2 % (ref 11.5–15.5)
WBC: 8.5 10*3/uL (ref 4.0–10.5)

## 2017-12-08 LAB — BASIC METABOLIC PANEL
Anion gap: 13 (ref 5–15)
BUN: 27 mg/dL — AB (ref 6–20)
CHLORIDE: 105 mmol/L (ref 101–111)
CO2: 20 mmol/L — AB (ref 22–32)
CREATININE: 1.29 mg/dL — AB (ref 0.61–1.24)
Calcium: 9.3 mg/dL (ref 8.9–10.3)
GFR calc non Af Amer: >60 mL/min (ref >60)
GLUCOSE: 119 mg/dL — AB (ref 65–99)
Potassium: 4.2 mmol/L (ref 3.5–5.1)
Sodium: 138 mmol/L (ref 135–145)

## 2017-12-14 ENCOUNTER — Encounter (HOSPITAL_COMMUNITY)
Admission: RE | Disposition: A | Discharge status: Home / Self Care | Payer: Self-pay | Source: Ambulatory Visit | Attending: Internal Medicine

## 2017-12-14 ENCOUNTER — Ambulatory Visit (HOSPITAL_COMMUNITY): Payer: Self-pay | Admitting: Anesthesiology

## 2017-12-14 ENCOUNTER — Encounter (HOSPITAL_COMMUNITY): Payer: Self-pay

## 2017-12-14 ENCOUNTER — Telehealth: Payer: Self-pay | Admitting: Internal Medicine

## 2017-12-14 ENCOUNTER — Ambulatory Visit (HOSPITAL_COMMUNITY)
Admission: RE | Admit: 2017-12-14 | Discharge: 2017-12-14 | Disposition: A | Discharge status: Home / Self Care | Payer: Self-pay | Source: Ambulatory Visit | Attending: Internal Medicine | Admitting: Internal Medicine

## 2017-12-14 DIAGNOSIS — D122 Benign neoplasm of ascending colon: Secondary | ICD-10-CM

## 2017-12-14 DIAGNOSIS — K209 Esophagitis, unspecified: Secondary | ICD-10-CM

## 2017-12-14 DIAGNOSIS — K210 Gastro-esophageal reflux disease with esophagitis: Secondary | ICD-10-CM | POA: Insufficient documentation

## 2017-12-14 DIAGNOSIS — J449 Chronic obstructive pulmonary disease, unspecified: Secondary | ICD-10-CM | POA: Insufficient documentation

## 2017-12-14 DIAGNOSIS — D128 Benign neoplasm of rectum: Secondary | ICD-10-CM

## 2017-12-14 DIAGNOSIS — Z800 Family history of malignant neoplasm of digestive organs: Secondary | ICD-10-CM | POA: Insufficient documentation

## 2017-12-14 DIAGNOSIS — Z885 Allergy status to narcotic agent status: Secondary | ICD-10-CM | POA: Insufficient documentation

## 2017-12-14 DIAGNOSIS — Z86010 Personal history of colonic polyps: Secondary | ICD-10-CM

## 2017-12-14 DIAGNOSIS — Z91040 Latex allergy status: Secondary | ICD-10-CM | POA: Insufficient documentation

## 2017-12-14 DIAGNOSIS — G4730 Sleep apnea, unspecified: Secondary | ICD-10-CM | POA: Insufficient documentation

## 2017-12-14 DIAGNOSIS — F17210 Nicotine dependence, cigarettes, uncomplicated: Secondary | ICD-10-CM | POA: Insufficient documentation

## 2017-12-14 DIAGNOSIS — R0602 Shortness of breath: Secondary | ICD-10-CM | POA: Insufficient documentation

## 2017-12-14 DIAGNOSIS — D120 Benign neoplasm of cecum: Secondary | ICD-10-CM | POA: Insufficient documentation

## 2017-12-14 DIAGNOSIS — Z888 Allergy status to other drugs, medicaments and biological substances status: Secondary | ICD-10-CM | POA: Insufficient documentation

## 2017-12-14 DIAGNOSIS — Z79899 Other long term (current) drug therapy: Secondary | ICD-10-CM | POA: Insufficient documentation

## 2017-12-14 DIAGNOSIS — I10 Essential (primary) hypertension: Secondary | ICD-10-CM | POA: Insufficient documentation

## 2017-12-14 DIAGNOSIS — F419 Anxiety disorder, unspecified: Secondary | ICD-10-CM | POA: Insufficient documentation

## 2017-12-14 DIAGNOSIS — R1310 Dysphagia, unspecified: Secondary | ICD-10-CM

## 2017-12-14 DIAGNOSIS — K3189 Other diseases of stomach and duodenum: Secondary | ICD-10-CM | POA: Insufficient documentation

## 2017-12-14 DIAGNOSIS — Z8719 Personal history of other diseases of the digestive system: Secondary | ICD-10-CM | POA: Insufficient documentation

## 2017-12-14 DIAGNOSIS — Z1211 Encounter for screening for malignant neoplasm of colon: Secondary | ICD-10-CM | POA: Insufficient documentation

## 2017-12-14 DIAGNOSIS — K640 First degree hemorrhoids: Secondary | ICD-10-CM | POA: Insufficient documentation

## 2017-12-14 DIAGNOSIS — F329 Major depressive disorder, single episode, unspecified: Secondary | ICD-10-CM | POA: Insufficient documentation

## 2017-12-14 HISTORY — PX: ESOPHAGOGASTRODUODENOSCOPY (EGD) WITH PROPOFOL: SHX5813

## 2017-12-14 HISTORY — PX: BIOPSY: SHX5522

## 2017-12-14 HISTORY — PX: COLONOSCOPY WITH PROPOFOL: SHX5780

## 2017-12-14 HISTORY — PX: MALONEY DILATION: SHX5535

## 2017-12-14 HISTORY — PX: POLYPECTOMY: SHX5525

## 2017-12-14 SURGERY — COLONOSCOPY WITH PROPOFOL
Anesthesia: Monitor Anesthesia Care

## 2017-12-14 MED ORDER — LACTATED RINGERS IV SOLN
INTRAVENOUS | Status: DC
  Administered 2017-12-14: 11:00:00 via INTRAVENOUS

## 2017-12-14 MED ORDER — PROPOFOL 10 MG/ML IV BOLUS
INTRAVENOUS | Status: AC
  Filled 2017-12-14: qty 40

## 2017-12-14 MED ORDER — LIDOCAINE VISCOUS 2 % MT SOLN
OROMUCOSAL | Status: AC
  Filled 2017-12-14: qty 15

## 2017-12-14 MED ORDER — PROPOFOL 500 MG/50ML IV EMUL
INTRAVENOUS | Status: DC | PRN
  Administered 2017-12-14: 150 ug/kg/min via INTRAVENOUS
  Administered 2017-12-14: 125 ug/kg/min via INTRAVENOUS
  Administered 2017-12-14: 100 ug/kg/min via INTRAVENOUS

## 2017-12-14 MED ORDER — LIDOCAINE HCL (PF) 2 % IJ SOLN
INTRAMUSCULAR | Status: AC
  Filled 2017-12-14: qty 30

## 2017-12-14 MED ORDER — LIDOCAINE VISCOUS 2 % MT SOLN
15.0000 mL | Freq: Once | OROMUCOSAL | Status: AC
  Administered 2017-12-14: 15 mL via OROMUCOSAL

## 2017-12-14 MED ORDER — PROPOFOL 10 MG/ML IV BOLUS
INTRAVENOUS | Status: DC | PRN
  Administered 2017-12-14 (×4): 20 mg via INTRAVENOUS

## 2017-12-14 NOTE — Transfer of Care (Signed)
Immediate Anesthesia Transfer of Care Note  Patient: [name]  Procedure(s) Performed: COLONOSCOPY WITH PROPOFOL (N/A ) ESOPHAGOGASTRODUODENOSCOPY (EGD) WITH PROPOFOL (N/A ) MALONEY DILATION (N/A ) BIOPSY POLYPECTOMY  Patient Location: PACU  Anesthesia Type:MAC  Level of Consciousness: awake, alert  and patient cooperative  Airway & Oxygen Therapy: Patient Spontanous Breathing and Patient connected to nasal cannula oxygen  Post-op Assessment: Report given to RN and Post -op Vital signs reviewed and stable  Post vital signs: Reviewed and stable  Last Vitals:  Vitals Value Taken Time  BP    Temp    Pulse 109 12/14/2017 12:12 PM  Resp    SpO2 82 % 12/14/2017 12:12 PM  Vitals shown include unvalidated device data.  Last Pain:  Vitals:   12/14/17 1132  TempSrc:   PainSc: 0-No pain         Complications: No apparent anesthesia complications

## 2017-12-14 NOTE — Anesthesia Postprocedure Evaluation (Signed)
Anesthesia Post Note  Patient: [name]  Procedure(s) Performed: COLONOSCOPY WITH PROPOFOL (N/A ) ESOPHAGOGASTRODUODENOSCOPY (EGD) WITH PROPOFOL (N/A ) MALONEY DILATION (N/A ) BIOPSY POLYPECTOMY  Patient location during evaluation: PACU Anesthesia Type: MAC Level of consciousness: awake and alert and patient cooperative Pain management: satisfactory to patient Vital Signs Assessment: post-procedure vital signs reviewed and stable Respiratory status: spontaneous breathing Cardiovascular status: stable Postop Assessment: no apparent nausea or vomiting Anesthetic complications: no     Last Vitals:  Vitals:   12/14/17 1040 12/14/17 1214  BP:  115/84  Pulse:  (!) 49  Resp: 13 15  Temp:  36.5 C  SpO2: 97% 98%    Last Pain:  Vitals:   12/14/17 1132  TempSrc:   PainSc: 0-No pain                 [place]pet[place] Elling

## 2017-12-14 NOTE — Op Note (Signed)
[facility] Patient Name: [name] Procedure Date: 12/14/2017 11:17 AM MRN: [id number] Date of Birth: 1966/08/27 Attending MD: Norvel Richards , MD CSN: [id number] Age: 52 Admit Type: Outpatient Procedure:                Upper GI endoscopy Indications:              Dysphagia Providers:                Norvel Richards, MD, Jeanann Lewandowsky. Sharon Seller, RN,                            Aram Candela Referring MD:              Medicines:                Propofol per Anesthesia Complications:            No immediate complications. Estimated Blood Loss:     Estimated blood loss was minimal. Procedure:                Pre-Anesthesia Assessment:                           - Prior to the procedure, a History and Physical                            was performed, and patient medications and                            allergies were reviewed. The patient's tolerance of                            previous anesthesia was also reviewed. The risks                            and benefits of the procedure and the sedation                            options and risks were discussed with the patient.                            All questions were answered, and informed consent                            was obtained. Prior Anticoagulants: The patient has                            taken no previous anticoagulant or antiplatelet                            agents. ASA Grade Assessment: II - A patient with                            mild systemic disease. After reviewing the risks  and benefits, the patient was deemed in                            satisfactory condition to undergo the procedure.                           After obtaining informed consent, the endoscope was                            passed under direct vision. Throughout the                            procedure, the patient's blood pressure, pulse, and                            oxygen saturations were  monitored continuously. The                            [id number] ([id number]) scope was introduced through the                            mouth, and advanced to the second part of duodenum.                            The upper GI endoscopy was accomplished without                            difficulty. The patient tolerated the procedure                            well. Scope In: 11:38:47 AM Scope Out: 11:45:07 AM Total Procedure Duration: 0 hours 6 minutes 20 seconds  Findings:      Esophagitis was found. distal esophageal erosions within 5 mm the GE       junction found. No tumor. No Barrett's epithelium seen. Mild       stricturing?"nonobstructing at this level. The dilation site was examined       following endoscope reinsertion and showed no change. Estimated blood       loss was minimal. The scope was withdrawn. Dilation was performed with a       Maloney dilator with mild resistance at 56 Fr. The dilation site was       examined following endoscope reinsertion and showed mild mucosal       disruption. Estimated blood loss was minimal.      Multiple gastric erosions seen throughout the stomach. No ulcer       infiltrating process. This was biopsied with a cold forceps for       histology.      Duodenal erosions present. Impression:               - Erosive reflux Esophagitis. Dilated.                           - erosive gastropathy. Biopsied.                           -  Duodendal erosions Moderate Sedation:      Moderate (conscious) sedation was personally administered by an       anesthesia professional. The following parameters were monitored: oxygen       saturation, heart rate, blood pressure, respiratory rate, EKG, adequacy       of pulmonary ventilation, and response to care. Total physician       intraservice time was 13 minutes.      Moderate (conscious) sedation was personally administered by an       anesthesia professional. The following parameters were monitored: oxygen        saturation, heart rate, blood pressure, respiratory rate, EKG, adequacy       of pulmonary ventilation, and response to care. Total physician       intraservice time was 31 minutes. Recommendation:           - Patient has a contact number available for                            emergencies. The signs and symptoms of potential                            delayed complications were discussed with the                            patient. Return to normal activities tomorrow.                            Written discharge instructions were provided to the                            patient.                           - Resume previous diet. Continue Dexilant 60 mg                            daily.                           - Continue present medications.                           - Return to my office in 3 months. See colonsocopy                            report Procedure Code(s):        --- Professional ---                           [phone], Esophagogastroduodenoscopy, flexible,                            transoral; with biopsy, single or multiple                           43450, Dilation of esophagus, by unguided sound or  bougie, single or multiple passes Diagnosis Code(s):        --- Professional ---                           K20.9, Esophagitis, unspecified                           R13.10, Dysphagia, unspecified CPT copyright 2017 [employer]. All rights reserved. The codes documented in this report are preliminary and upon coder review may  be revised to meet current compliance requirements. [name]. [name], MD Norvel Richards, MD 12/14/2017 12:13:57 PM This report has been signed electronically. Number of Addenda: 0

## 2017-12-14 NOTE — Op Note (Addendum)
[facility] Patient Name: [name] Procedure Date: 12/14/2017 11:21 AM MRN: [id number] Date of Birth: 03/01/1966 Attending MD: Norvel Richards , MD CSN: [id number] Age: 52 Admit Type: Outpatient Procedure:                Colonoscopy Indications:              High risk colon cancer surveillance: Personal                            history of colonic polyps Providers:                Norvel Richards, MD, Otis Peak B. Sharon Seller, RN,                            Aram Candela Referring MD:              Medicines:                Propofol per Anesthesia Complications:            No immediate complications. Estimated Blood Loss:     Estimated blood loss was minimal. Procedure:                Pre-Anesthesia Assessment:                           - Prior to the procedure, a History and Physical                            was performed, and patient medications and                            allergies were reviewed. The patient's tolerance of                            previous anesthesia was also reviewed. The risks                            and benefits of the procedure and the sedation                            options and risks were discussed with the patient.                            All questions were answered, and informed consent                            was obtained. Prior Anticoagulants: The patient has                            taken no previous anticoagulant or antiplatelet                            agents. ASA Grade Assessment: II - A patient with  mild systemic disease. After reviewing the risks                            and benefits, the patient was deemed in                            satisfactory condition to undergo the procedure.                           After obtaining informed consent, the colonoscope                            was passed under direct vision. Throughout the                            procedure, the patient's  blood pressure, pulse, and                            oxygen saturations were monitored continuously. The                            [id number] ([id number]) scope was introduced through                            the anus and advanced to the the cecum, identified                            by appendiceal orifice and ileocecal valve. The                            colonoscopy was performed without difficulty. The                            patient tolerated the procedure well. The ileocecal                            valve, appendiceal orifice, and rectum were                            photographed. The colonoscopy was performed without                            difficulty. The entire colon was well visualized.                            The quality of the bowel preparation was adequate. Scope In: 11:49:10 AM Scope Out: 12:03:24 PM Scope Withdrawal Time: 0 hours 11 minutes 23 seconds  Total Procedure Duration: 0 hours 14 minutes 14 seconds  Findings:      The perianal and digital rectal examinations were normal.      Two sessile polyps were found in the cecum. The polyps were 4 to 6 mm in       size. These polyps were removed with a cold snare. Resection and       retrieval  were complete. Estimated blood loss was minimal. These Polyps       Appear to Be Growing Out Of the Ileocecal Bowel. There Appear a Surgical       Clip at the Appendiceal Orifice. Single mm polyp in the proximal rectum       removed with cold snare.      Non-bleeding internal hemorrhoids were found during retroflexion. The       hemorrhoids were Grade I (internal hemorrhoids that do not prolapse).      The exam was otherwise without abnormality on direct and retroflexion       views. Impression:               - Two 4 to 6 mm polyps in the cecum, removed with a                            cold snare. Resected and retrieved.                           - Non-bleeding internal hemorrhoids. Single rectal                             polyp removed.                           - The examination was otherwise normal on direct                            and retroflexion views. Moderate Sedation:      Moderate (conscious) sedation was personally administered by an       anesthesia professional. The following parameters were monitored: oxygen       saturation, heart rate, blood pressure, respiratory rate, EKG, adequacy       of pulmonary ventilation, and response to care. Total physician       intraservice time was 31 minutes. Recommendation:           - Patient has a contact number available for                            emergencies. The signs and symptoms of potential                            delayed complications were discussed with the                            patient. Return to normal activities tomorrow.                            Written discharge instructions were provided to the                            patient. Procedure Code(s):        --- Professional ---                           [phone], Colonoscopy, flexible; with removal of  tumor(s), polyp(s), or other lesion(s) by snare                            technique Diagnosis Code(s):        --- Professional ---                           Z86.010, Personal history of colonic polyps                           D12.0, Benign neoplasm of cecum                           K64.0, First degree hemorrhoids CPT copyright 2017 American Medical Association. All rights reserved. The codes documented in this report are preliminary and upon coder review may  be revised to meet current compliance requirements. [name]. Kona Yusuf, MD Norvel Richards, MD 12/14/2017 12:21:23 PM This report has been signed electronically. Number of Addenda: 0

## 2017-12-14 NOTE — Anesthesia Preprocedure Evaluation (Signed)
Anesthesia Evaluation  Patient identified by MRN, date of birth, ID band Patient awake    Reviewed: Allergy & Precautions, H&P , NPO status , Patient's Chart, lab work & pertinent test results  Airway Mallampati: I  TM Distance: >3 FB Neck ROM: full    Dental no notable dental hx. (+) Edentulous Upper, Edentulous Lower   Pulmonary neg pulmonary ROS, shortness of breath, sleep apnea , COPD, Current Smoker,    Pulmonary exam normal breath sounds clear to auscultation       Cardiovascular Exercise Tolerance: Good hypertension, negative cardio ROS   Rhythm:regular Rate:Normal     Neuro/Psych  Headaches, Anxiety Depression negative neurological ROS  negative psych ROS   GI/Hepatic negative GI ROS, Neg liver ROS, hiatal hernia, GERD  ,  Endo/Other  negative endocrine ROS  Renal/GU Renal diseasenegative Renal ROS  negative genitourinary   Musculoskeletal   Abdominal   Peds  Hematology negative hematology ROS (+)   Anesthesia Other Findings   Reproductive/Obstetrics negative OB ROS                             Anesthesia Physical Anesthesia Plan  ASA: III  Anesthesia Plan: MAC   Post-op Pain Management:    Induction:   PONV Risk Score and Plan:   Airway Management Planned:   Additional Equipment:   Intra-op Plan:   Post-operative Plan:   Informed Consent: I have reviewed the patients History and Physical, chart, labs and discussed the procedure including the risks, benefits and alternatives for the proposed anesthesia with the patient or authorized representative who has indicated his/her understanding and acceptance.   Dental Advisory Given  Plan Discussed with: CRNA  Anesthesia Plan Comments:         Anesthesia Quick Evaluation

## 2017-12-14 NOTE — Discharge Instructions (Signed)
°Colonoscopy °Discharge Instructions ° °Read the instructions outlined below and refer to this sheet in the next few weeks. These discharge instructions provide you with general information on caring for yourself after you leave the hospital. Your doctor may also give you specific instructions. While your treatment has been planned according to the most current medical practices available, unavoidable complications occasionally occur. If you have any problems or questions after discharge, call Dr. Rourk at [phone]. °ACTIVITY °· You may resume your regular activity, but move at a slower pace for the next 24 hours.  °· Take frequent rest periods for the next 24 hours.  °· Walking will help get rid of the air and reduce the bloated feeling in your belly (abdomen).  °· No driving for 24 hours (because of the medicine (anesthesia) used during the test).   °· Do not sign any important legal documents or operate any machinery for 24 hours (because of the anesthesia used during the test).  °NUTRITION °· Drink plenty of fluids.  °· You may resume your normal diet as instructed by your doctor.  °· Begin with a light meal and progress to your normal diet. Heavy or fried foods are harder to digest and may make you feel sick to your stomach (nauseated).  °· Avoid alcoholic beverages for 24 hours or as instructed.  °MEDICATIONS °· You may resume your normal medications unless your doctor tells you otherwise.  °WHAT YOU CAN EXPECT TODAY °· Some feelings of bloating in the abdomen.  °· Passage of more gas than usual.  °· Spotting of blood in your stool or on the toilet paper.  °IF YOU HAD POLYPS REMOVED DURING THE COLONOSCOPY: °· No aspirin products for 7 days or as instructed.  °· No alcohol for 7 days or as instructed.  °· Eat a soft diet for the next 24 hours.  °FINDING OUT THE RESULTS OF YOUR TEST °Not all test results are available during your visit. If your test results are not back during the visit, make an appointment  with your caregiver to find out the results. Do not assume everything is normal if you have not heard from your caregiver or the medical facility. It is important for you to follow up on all of your test results.  °SEEK IMMEDIATE MEDICAL ATTENTION IF: °· You have more than a spotting of blood in your stool.  °· Your belly is swollen (abdominal distention).  °· You are nauseated or vomiting.  °· You have a temperature over 101.  °· You have abdominal pain or discomfort that is severe or gets worse throughout the day.  °EGD °Discharge instructions °Please read the instructions outlined below and refer to this sheet in the next few weeks. These discharge instructions provide you with general information on caring for yourself after you leave the hospital. Your doctor may also give you specific instructions. While your treatment has been planned according to the most current medical practices available, unavoidable complications occasionally occur. If you have any problems or questions after discharge, please call your doctor. °ACTIVITY °· You may resume your regular activity but move at a slower pace for the next 24 hours.  °· Take frequent rest periods for the next 24 hours.  °· Walking will help expel (get rid of) the air and reduce the bloated feeling in your abdomen.  °· No driving for 24 hours (because of the anesthesia (medicine) used during the test).  °· You may shower.  °· Do not sign any important   legal documents or operate any machinery for 24 hours (because of the anesthesia used during the test).  NUTRITION  Drink plenty of fluids.   You may resume your normal diet.   Begin with a light meal and progress to your normal diet.   Avoid alcoholic beverages for 24 hours or as instructed by your caregiver.  MEDICATIONS  You may resume your normal medications unless your caregiver tells you otherwise.  WHAT YOU CAN EXPECT TODAY  You may experience abdominal discomfort such as a feeling of fullness  or gas pains.  FOLLOW-UP  Your doctor will discuss the results of your test with you.  SEEK IMMEDIATE MEDICAL ATTENTION IF ANY OF THE FOLLOWING OCCUR:  Excessive nausea (feeling sick to your stomach) and/or vomiting.   Severe abdominal pain and distention (swelling).   Trouble swallowing.   Temperature over 101 F (37.8 C).   Rectal bleeding or vomiting of blood.    GERD, hemorrhoids and colon polyp information provided  Begin Dexilant 60 mg daily  Further recommendations to follow pending review of pathology report  Office visit with [place] in 3 months.  Aug 12 at 10:30 am  Gastroesophageal Reflux Disease, Adult Normally, food travels down the esophagus and stays in the stomach to be digested. However, when a person has gastroesophageal reflux disease (GERD), food and stomach acid move back up into the esophagus. When this happens, the esophagus becomes sore and inflamed. Over time, GERD can create small holes (ulcers) in the lining of the esophagus. What are the causes? This condition is caused by a problem with the muscle between the esophagus and the stomach (lower esophageal sphincter, or LES). Normally, the LES muscle closes after food passes through the esophagus to the stomach. When the LES is weakened or abnormal, it does not close properly, and that allows food and stomach acid to go back up into the esophagus. The LES can be weakened by certain dietary substances, medicines, and medical conditions, including:  Tobacco use.  Pregnancy.  Having a hiatal hernia.  Heavy alcohol use.  Certain foods and beverages, such as coffee, chocolate, onions, and peppermint.  What increases the risk? This condition is more likely to develop in:  People who have an increased body weight.  People who have connective tissue disorders.  People who use NSAID medicines.  What are the signs or symptoms? Symptoms of this condition include:  Heartburn.  Difficult or painful  swallowing.  The feeling of having a lump in the throat.  Abitter taste in the mouth.  Bad breath.  Having a large amount of saliva.  Having an upset or bloated stomach.  Belching.  Chest pain.  Shortness of breath or wheezing.  Ongoing (chronic) cough or a night-time cough.  Wearing away of tooth enamel.  Weight loss.  Different conditions can cause chest pain. Make sure to see your health care provider if you experience chest pain. How is this diagnosed? Your health care provider will take a medical history and perform a physical exam. To determine if you have mild or severe GERD, your health care provider may also monitor how you respond to treatment. You may also have other tests, including:  An endoscopy toexamine your stomach and esophagus with a small camera.  A test thatmeasures the acidity level in your esophagus.  A test thatmeasures how much pressure is on your esophagus.  A barium swallow or modified barium swallow to show the shape, size, and functioning of your esophagus.  How is  this treated? The goal of treatment is to help relieve your symptoms and to prevent complications. Treatment for this condition may vary depending on how severe your symptoms are. Your health care provider may recommend:  Changes to your diet.  Medicine.  Surgery.  Follow these instructions at home: Diet  Follow a diet as recommended by your health care provider. This may involve avoiding foods and drinks such as: ? Coffee and tea (with or without caffeine). ? Drinks that containalcohol. ? Energy drinks and sports drinks. ? Carbonated drinks or sodas. ? Chocolate and cocoa. ? Peppermint and mint flavorings. ? Garlic and onions. ? Horseradish. ? Spicy and acidic foods, including peppers, chili powder, curry powder, vinegar, hot sauces, and barbecue sauce. ? Citrus fruit juices and citrus fruits, such as oranges, lemons, and limes. ? Tomato-based foods, such as red  sauce, chili, salsa, and pizza with red sauce. ? Fried and fatty foods, such as donuts, french fries, potato chips, and high-fat dressings. ? High-fat meats, such as hot dogs and fatty cuts of red and white meats, such as rib eye steak, sausage, ham, and bacon. ? High-fat dairy items, such as whole milk, butter, and cream cheese.  Eat small, frequent meals instead of large meals.  Avoid drinking large amounts of liquid with your meals.  Avoid eating meals during the 2-3 hours before bedtime.  Avoid lying down right after you eat.  Do not exercise right after you eat. General instructions  Pay attention to any changes in your symptoms.  Take over-the-counter and prescription medicines only as told by your health care provider. Do not take aspirin, ibuprofen, or other NSAIDs unless your health care provider told you to do so.  Do not use any tobacco products, including cigarettes, chewing tobacco, and e-cigarettes. If you need help quitting, ask your health care provider.  Wear loose-fitting clothing. Do not wear anything tight around your waist that causes pressure on your abdomen.  Raise (elevate) the head of your bed 6 inches (15cm).  Try to reduce your stress, such as with yoga or meditation. If you need help reducing stress, ask your health care provider.  If you are overweight, reduce your weight to an amount that is healthy for you. Ask your health care provider for guidance about a safe weight loss goal.  Keep all follow-up visits as told by your health care provider. This is important. Contact a health care provider if:  You have new symptoms.  You have unexplained weight loss.  You have difficulty swallowing, or it hurts to swallow.  You have wheezing or a persistent cough.  Your symptoms do not improve with treatment.  You have a hoarse voice. Get help right away if:  You have pain in your arms, neck, jaw, teeth, or back.  You feel sweaty, dizzy, or  light-headed.  You have chest pain or shortness of breath.  You vomit and your vomit looks like blood or coffee grounds.  You faint.  Your stool is bloody or black.  You cannot swallow, drink, or eat. This information is not intended to replace advice given to you by your health care provider. Make sure you discuss any questions you have with your health care provider. Document Released: 06/04/2005 Document Revised: 01/23/2016 Document Reviewed: 12/20/2014 Elsevier Interactive Patient Education  [employer].

## 2017-12-14 NOTE — H&P (Signed)
Chief Complaint  Patient presents with  . colonoscopy and upper endoscopy    HPI:  [name] is a 52 y.o. male here to update H+P prior to upcoming colonoscopy and upper endoscopy. He was seen in 09/2017 for RUQ pain, h/o diverticulitis at request of [facility], Rosealee Albee, NP.   He has h/o chronic abdominal pain. He had ventral hernia repair with mesh in 2010 by Dr. Burke Keels. In 2011 he had perforateddiverticulitis requiring resection and colostomy bag. He was in prison at the time per patient for DUI. He went back for colostomy reversal around 2012 according to the patient surgery was complicated by injury to the colon requiring an ileostomy. Ultimately he had the ileostomy reversed.  Patient's last colonoscopy was in August 2012 at [facility], performed via stoma. 2 sessile polyps in the transverse colon removed/partial retrieval, congested mucosa in the area 3 cm proximal to the stoma, erythematous mucosa in the Hartmann pouch. Repeat colonoscopy recommended in August 2017 for adenomatous colon polyps.  Patient complains of poor appetite. Weight is up and down, states anxiety medication has helped his appetite in the past but he can't get any from his PCP. Doesn't want to follow up with mental health due to transportation concerns. He has bad reflux which was well controlled in the past on Dexilant but he cannot afford. He is taking zantac 300mg  1-2 times daily. Complains of solid food dysphagia. No vomiting. BMs 3-4 per day. Occasional constipation but not lately. No melena. Some intermittent brbpr.    CT A/P with contrast 10/05/17 to evaluate his ruq pain showed left upper pole subcentimeter low-denisty structure probably a cyst, prior partial colectomy with anastomosis rectosigmoid region. Prior Hartmann's pouch right lower quadrant with residual prominent small bowel loop at this level. Prior umb henria repair.   I personally reviewed CT with Dr. Thornton Papas,  renal lesion is a cyst and does not need further work up.         Current Outpatient Medications  Medication Sig Dispense Refill  . albuterol (PROVENTIL HFA;VENTOLIN HFA) 108 (90 Base) MCG/ACT inhaler Inhale 2 puffs into the lungs every 6 (six) hours as needed for wheezing or shortness of breath.    . cyclobenzaprine (FLEXERIL) 5 MG tablet Take 1 tablet by mouth as needed.    . hydrOXYzine (ATARAX/VISTARIL) 50 MG tablet Take 50 mg by mouth every 6 (six) hours as needed.    Marland Kitchen losartan (COZAAR) 50 MG tablet Take 50 mg by mouth daily.  5  . mometasone-formoterol (DULERA) 100-5 MCG/ACT AERO Inhale 2 puffs into the lungs 2 (two) times daily.    . ranitidine (ZANTAC) 150 MG tablet Take 150 mg by mouth 2 (two) times daily.    . tamsulosin (FLOMAX) 0.4 MG CAPS capsule Take 0.4 mg by mouth daily.  5   No current facility-administered medications for this visit.          Allergies as of 11/30/2017 - Review Complete 11/30/2017  Allergen Reaction Noted  . Codeine Itching and Nausea And Vomiting   . Latex Other (See Comments) 03/09/2012  . Lisinopril  05/27/2017  . Morphine Hives   . Adhesive [tape] Rash 03/08/2012        Past Medical History:  Diagnosis Date  . Acute pancreatitis   . Anxiety   . Cannabis abuse   . Complication of anesthesia    talking out of my head  . COPD (chronic obstructive pulmonary disease) ([place])   .  Diverticulitis of colon with perforation 08/2010  . GERD (gastroesophageal reflux disease)   . Hypertension    taken off lisinopril by renal md 08/2011  . Renal insufficiency    follwed by Dr. Hinda Lenis         Past Surgical History:  Procedure Laterality Date  . APPENDECTOMY    . COLON SURGERY  05/2011   [facility]. exp laparotomy with colostomy takedown, lysis of adhesions (3hours), loop ileostomy. complicated by abscess formation requiring percutaneous drainage  . colonoscopy via stoma with endoscopy of [name]  04/2011    [facility]. two sessile polyps in trv colon. complete resection but only partial retrieval. congested mucosa in area 3cm proximal to stoma, erythematous mucosa in Hartmann pouch. repeat tcs 8/2017path-adenomatous polyp  . ESOPHAGOGASTRODUODENOSCOPY  03/05/2012   erosive reflux esophagitis, bulbar erosions, gastric erosions/petichae, bx showed gastritis, no H.Pylori. Procedure: ESOPHAGOGASTRODUODENOSCOPY (EGD);  Surgeon: Daneil Dolin, MD;  Location: [facility];  Service: Endoscopy;  Laterality: N/A;  11:30  . MULTIPLE EXTRACTIONS WITH ALVEOLOPLASTY  02/23/2012   Procedure: MULTIPLE EXTRACION WITH ALVEOLOPLASTY;  Surgeon: Gae Bon, DDS;  Location: [place];  Service: Oral Surgery;  Laterality: Bilateral;  Removal of left mandibular torus, multiple extractions with alveoloplasty  . reversal of colostomy  10/2011   per patient, did not have report  . right knee surgery    . sigmoid colectomy with end colostomy, Hartmann's pouch  08/2010   [facility]. complicated diverticulitis  . UMBILICAL HERNIA REPAIR           Family History  Problem Relation Age of Onset  . Stomach cancer Father   . Lung cancer Father   . Throat cancer Father   . Liver disease Cousin        cirrhosis, etoh  . Colon cancer Paternal Uncle        >60  . Colon cancer Cousin 40    Social History        Socioeconomic History  . Marital status: Single    Spouse name: Not on file  . Number of children: 0  . Years of education: Not on file  . Highest education level: Not on file  Occupational History  . Not on file  Social Needs  . Financial resource strain: Not on file  . Food insecurity:    Worry: Not on file    Inability: Not on file  . Transportation needs:    Medical: Not on file    Non-medical: Not on file  Tobacco Use  . Smoking status: Current Every Day Smoker    Packs/day: 1.00    Types: Cigarettes  . Smokeless tobacco: Never Used  Substance and Sexual Activity    . Alcohol use: No    Frequency: Never    Comment: former user quit in 2014  . Drug use: Yes    Types: Marijuana    Comment: occ  . Sexual activity: Not on file  Lifestyle  . Physical activity:    Days per week: Not on file    Minutes per session: Not on file  . Stress: Not on file  Relationships  . Social connections:    Talks on phone: Not on file    Gets together: Not on file    Attends religious service: Not on file    Active member of club or organization: Not on file    Attends meetings of clubs or organizations: Not on file    Relationship status: Not on file  .  Intimate partner violence:    Fear of current or ex partner: Not on file    Emotionally abused: Not on file    Physically abused: Not on file    Forced sexual activity: Not on file  Other Topics Concern  . Not on file  Social History Narrative  . Not on file      ROS:  General: Negative for anorexia,   fever, chills, fatigue, weakness. Weight fluctuates from 190 to 178, currently 184.  Eyes: Negative for vision changes.  ENT: Negative for hoarseness,  nasal congestion. See hpi CV: Negative for chest pain, angina, palpitations, dyspnea on exertion, peripheral edema.  Respiratory: Negative for dyspnea at rest, dyspnea on exertion, cough, sputum, wheezing.  GI: See history of present illness. GU:  Negative for dysuria, hematuria, urinary incontinence, urinary frequency, nocturnal urination.  MS: Negative for joint pain, low back pain.  Derm: Negative for rash or itching.  Neuro: Negative for weakness, abnormal sensation, seizure, frequent headaches, memory loss, confusion.  Psych: Negative for anxiety, depression, suicidal ideation, hallucinations.  Endo: Negative for unusual weight change.  Heme: Negative for bruising or bleeding. Allergy: Negative for rash or hives.    Physical Examination:  BP 136/89   Pulse 74   Temp (!) 96.8 F (36 C) (Oral)   Ht 5\' 7"   (1.702 m)   Wt 184 lb 6.4 oz (83.6 kg)   BMI 28.88 kg/m    General: Well-nourished, well-developed in no acute distress.  Head: Normocephalic, atraumatic.   Eyes: Conjunctiva pink, no icterus. Mouth: Oropharyngeal mucosa moist and pink , no lesions erythema or exudate. Neck: Supple without thyromegaly, masses, or lymphadenopathy.  Lungs: Clear to auscultation bilaterally.  Heart: Regular rate and rhythm, no murmurs rubs or gallops.  Abdomen: Bowel sounds are normal,   nondistended, no hepatosplenomegaly or masses, no abdominal bruits or    hernia , no rebound or guarding.  ruq moderately tender. Multiple well healed incisions. Right of umb, sharp palpable item under the skip at site of hernia, ?suture or mesh Rectal: not performed Extremities: No lower extremity edema. No clubbing or deformities.  Neuro: Alert and oriented x 4 , grossly normal neurologically.  Skin: Warm and dry, no rash or jaundice.   Psych: Alert and cooperative, normal mood and affect.  Labs: RecentLabs       Lab Results  Component Value Date   CREATININE 1.34 (H) 09/24/2017   BUN 21 (H) 09/24/2017   NA 135 09/24/2017   K 4.4 09/24/2017   CL 104 09/24/2017   CO2 22 09/24/2017     RecentLabs  Lab Results  Component Value Date   ALT 15 (L) 09/24/2017   AST 19 09/24/2017   ALKPHOS 70 09/24/2017   BILITOT 0.6 09/24/2017     RecentLabs       Lab Results  Component Value Date   WBC 9.1 09/24/2017   HGB 16.8 09/24/2017   HCT 49.7 09/24/2017   MCV 91.7 09/24/2017   PLT 215 09/24/2017     RecentLabs       Lab Results  Component Value Date   LIPASE 53 (H) 09/24/2017       Imaging Studies: ImagingResults  No results found.    Impression/Plan:  52 year old gentleman presenting to schedule his colonoscopy and upper endoscopy with possible esophageal dilation.  He has a history of right upper quadrant pain in the setting of multiple abdominal surgeries as outlined  above.  Also notes pulling and tugging at site of  his prior umbilical hernia repair/mesh.  He is overdue for colonoscopy for history of colon polyps and has intermittent bright red blood per rectum.  GERD is fairly well controlled on ranitidine but previously better on PPI.  Unfortunately his medications have been limited by [employer].  He has intermittent poor appetite and weight tends to fluctuate with this.  Had loss some weight prior to his last visit but has gained some of that back.  Ongoing solid food dysphagia.  Plan for colonoscopy/EGD/EGD with propofol in the near future.  I have discussed the risks, alternatives, benefits with regards to but not limited to the risk of reaction to medication, bleeding, infection, perforation and the patient is agreeable to proceed. Written consent to be obtained.     12/14/2017  No change. Persisting dysphagia. History of colonic polyps.  Here for EGD with esophageal dilation and colonoscopy per plan.   The risks, benefits, limitations, imponderables and alternatives regarding both EGD and colonoscopy have been reviewed with the patient. Questions have been answered. All parties agreeable.        Revision History

## 2017-12-14 NOTE — Telephone Encounter (Signed)
Pt called to say he had his procedure done today and the Dexilant 60 mg that was called into [facility] was over $300 and he can't afford that. Is there something else cheaper we can send in for him? [phone]

## 2017-12-14 NOTE — Anesthesia Procedure Notes (Signed)
Procedure Name: MAC Date/Time: 12/14/2017 11:29 AM Performed by: Vista Deck, CRNA Pre-anesthesia Checklist: Patient identified, Emergency Drugs available, Suction available, Timeout performed and Patient being monitored Patient Re-evaluated:Patient Re-evaluated prior to induction Oxygen Delivery Method: Non-rebreather mask

## 2017-12-14 NOTE — Telephone Encounter (Signed)
Pt assistance forms need to be mailed to pt and samples will be provided.

## 2017-12-18 ENCOUNTER — Encounter: Payer: Self-pay | Admitting: Internal Medicine

## 2017-12-18 ENCOUNTER — Encounter (HOSPITAL_COMMUNITY): Payer: Self-pay | Admitting: Internal Medicine

## 2018-04-13 ENCOUNTER — Other Ambulatory Visit: Payer: Self-pay | Admitting: Physician Assistant

## 2018-04-19 ENCOUNTER — Encounter: Payer: Self-pay | Admitting: Gastroenterology

## 2018-04-19 ENCOUNTER — Telehealth: Payer: Self-pay | Admitting: Gastroenterology

## 2018-04-19 ENCOUNTER — Ambulatory Visit (INDEPENDENT_AMBULATORY_CARE_PROVIDER_SITE_OTHER): Payer: Self-pay | Admitting: Gastroenterology

## 2018-04-19 VITALS — BP 142/94 | HR 68 | Temp 96.7°F | Ht 67.0 in | Wt 189.6 lb

## 2018-04-19 DIAGNOSIS — R1319 Other dysphagia: Secondary | ICD-10-CM

## 2018-04-19 DIAGNOSIS — G8929 Other chronic pain: Secondary | ICD-10-CM

## 2018-04-19 DIAGNOSIS — K2100 Gastro-esophageal reflux disease with esophagitis, without bleeding: Secondary | ICD-10-CM

## 2018-04-19 DIAGNOSIS — R1310 Dysphagia, unspecified: Secondary | ICD-10-CM

## 2018-04-19 DIAGNOSIS — R109 Unspecified abdominal pain: Secondary | ICD-10-CM

## 2018-04-19 MED ORDER — DEXLANSOPRAZOLE 60 MG PO CPDR: 60.0000 mg | DELAYED_RELEASE_CAPSULE | Freq: Every day | ORAL | 0 refills | Status: DC

## 2018-04-19 NOTE — Telephone Encounter (Signed)
Please let patient know that omeprazole 40 mg daily appears to be covered by [employer].  If he is interested we can try that prescription right now or we could continue with option discussed today which is to have him complete patient assistance forms for Dexilant.  Please let me know which he desires.

## 2018-04-19 NOTE — Telephone Encounter (Signed)
Lmom, waiting on a return call.  

## 2018-04-19 NOTE — Telephone Encounter (Signed)
Spoke with pt and he is ok with trying Omeprazole. Please send in for pt.

## 2018-04-19 NOTE — Progress Notes (Signed)
Primary Care Physician: Wyatt Haste, NP  Primary Gastroenterologist:  Garfield Cornea, MD   Chief Complaint  Patient presents with  . Abdominal Pain    ? hernia mesh; pp f/u    HPI: [name] is a 52 y.o. male here for follow up of colonoscopy and upper endoscopy done back in 12/2017.   Patient has a history of chronic abdominal pain, primarily at site of ventral hernia repair with mesh and ileostomy.  He had ventral hernia repair in 2010 by Dr. Anthony Sar.  He reports in 2011 he had perforated diverticulitis requiring resection and colostomy bag.  He was in prison at the time for DUI.  He went back for colostomy reversal around 2012 and according to the patient was complicated by injury to the colon requiring an ileostomy.  Ultimately he had the ileostomy reversed.  For further evaluation of his abdominal pain he had CT back in January which showed left upper pole subcentimeter low-density structure probably a cyst in the kidney, prior partial colectomy with anastomosis rectosigmoid region.  Prior Hartman's pouch right lower quadrant with residual prominent small bowel loop at this level.  Prior umbilical hernia repair.  Viewed with radiology, renal lesion is a cyst and does not require further work-up.  Patient had EGD and colonoscopy in April.  He had esophagitis, mild stricturing status post esophageal dilation, multiple gastric erosions.  Biopsy showed reactive gastropathy, no H. pylori.  He had 2 sessile polyps in the cecum measuring 4 to 6 mm in size which were resected and retrieved.  These polyps appear to be growing out of the ileocecal valve.  There appeared to be a surgical clip at the appendiceal orifice.  Single polyp removed from the rectum.  Nonbleeding internal hemorrhoids noted.  Polyps were tubular adenomas.  Colonoscopy in 5 years.  Patient was not able to afford Dexilant.  He did not complete patient assistance form stating he has trouble completing those type of  forms.  He has a lot of heartburn. Zantac 6 per day sometimes. Still doesn't get rid of. Failed omeprazole while in prison, does not know what dose he was on.  Dexilant really helped. Swallowing problems still. Did not help with stretching.  Has heartburn all the time.  Bowel movements stable, chronically has intermittent frequent stools up to 4-5 times per day, some days normal.  No melena or rectal bleeding.  Continues to have pain at site of prior umbilical hernia repair/mesh and at ileostomy site.  Pain is daily but intermittent.  He reports previously at pain clinic.  He is interested in discussing these issues with his surgeon, Dr. Anthony Sar.  He plans to call to try to get an appointment.   Current Outpatient Medications  Medication Sig Dispense Refill  . albuterol (PROVENTIL HFA;VENTOLIN HFA) 108 (90 Base) MCG/ACT inhaler Inhale 2 puffs into the lungs every 6 (six) hours as needed for wheezing or shortness of breath.    Marland Kitchen atorvastatin (LIPITOR) 10 MG tablet Take 10 mg by mouth daily.    . cyclobenzaprine (FLEXERIL) 10 MG tablet Take 10 mg by mouth 3 (three) times daily as needed for muscle spasms.    . hydrocortisone cream 1 % Apply 1 application topically 2 (two) times daily as needed for itching (for rash.).    Marland Kitchen hydrOXYzine (VISTARIL) 50 MG capsule Take 1 capsule by mouth every 6 (six) hours as needed.  11  . losartan (COZAAR) 50 MG tablet Take 50 mg by  mouth 2 (two) times daily.   5  . mometasone-formoterol (DULERA) 100-5 MCG/ACT AERO Inhale 2 puffs into the lungs 2 (two) times daily.    . ranitidine (ZANTAC) 150 MG tablet Take 150 mg by mouth 2 (two) times daily.    . tamsulosin (FLOMAX) 0.4 MG CAPS capsule Take 0.4 mg by mouth daily.  5   No current facility-administered medications for this visit.     Allergies as of 04/19/2018 - Review Complete 04/19/2018  Allergen Reaction Noted  . Codeine Itching and Nausea And Vomiting   . Latex Other (See Comments) 03/09/2012  . Lisinopril   05/27/2017  . Morphine Hives   . Adhesive [tape] Rash 03/08/2012    ROS:  General: Negative for anorexia, weight loss, fever, chills, fatigue, weakness. ENT: Negative for hoarseness,   nasal congestion. CV: Negative for chest pain, angina, palpitations, dyspnea on exertion, peripheral edema.  Respiratory: Negative for dyspnea at rest, dyspnea on exertion, cough, sputum, wheezing.  GI: See history of present illness. GU:  Negative for dysuria, hematuria, urinary incontinence, urinary frequency, nocturnal urination.  Endo: Negative for unusual weight change.    Physical Examination:   BP (!) 142/94   Pulse 68   Temp (!) 96.7 F (35.9 C) (Oral)   Ht 5\' 7"  (1.702 m)   Wt 189 lb 9.6 oz (86 kg)   BMI 29.70 kg/m   General: Well-nourished, well-developed in no acute distress.  Eyes: No icterus. Mouth: Oropharyngeal mucosa moist and pink , no lesions erythema or exudate. Lungs: Clear to auscultation bilaterally.  Heart: Regular rate and rhythm, no murmurs rubs or gallops.  Abdomen: Bowel sounds are normal,   nondistended, no hepatosplenomegaly or masses, no abdominal bruits or hernia , no rebound or guarding.  Mild tenderness in rlq in general but also over prior stoma site, midline incision.  Extremities: No lower extremity edema. No clubbing or deformities. Neuro: Alert and oriented x 4   Skin: Warm and dry, no jaundice.   Psych: Alert and cooperative, normal mood and affect.

## 2018-04-19 NOTE — Patient Instructions (Addendum)
1. Start Dexilant 60mg  30 minutes before breakfast daily. Samples provided. Please complete patient assistance forms so we can try to get the medication for you at no cost. Ranitidine is not strong enough to heal your esophagus. You can continue to take zantac AS NEEDED for breakthrough reflux symptoms (no more than 2 per day).  2. If you have persistent swallowing problems after being on Dexilant daily for one month, please let me know.  3. Speak to Dr. Anthony Sar about abdominal pain at site of your incision/stoma. 4. Return to our office in four months for follow up with Dr. Gala Romney.

## 2018-04-19 NOTE — Assessment & Plan Note (Signed)
He will call Dr. Marda Stalker office for an appointment regarding pain at site of incision/mesh, etc.

## 2018-04-19 NOTE — Assessment & Plan Note (Addendum)
Erosive reflux esophagitis on recent EGD. Patient uninsured and obtaining ranitidine from [facility].  Symptoms are poorly controlled even on high-dose ranitidine, patient sometimes taking over the prescribed amount, 5-6 150 mg tablets daily.  He did much better on Dexilant but has not completed patient assistance forms.  We had no samples today.  Other option would be omeprazole 40 mg daily, previously failed omeprazole while in prison but unclear what dose he was on.    He will call for samples and complete patient assistance forms for Dexilant.  If significant delay then we will try the omeprazole 40 mg daily through [place].  I suspect his dysphagia will improve once his reflux is under control.

## 2018-04-20 MED ORDER — OMEPRAZOLE 40 MG PO CPDR
40.0000 mg | DELAYED_RELEASE_CAPSULE | Freq: Every day | ORAL | 3 refills | Status: DC
Start: 2018-04-20 — End: 2019-09-08

## 2018-04-20 NOTE — Progress Notes (Signed)
CC'D TO PCP °

## 2018-04-20 NOTE — Telephone Encounter (Signed)
RX sent

## 2018-04-20 NOTE — Telephone Encounter (Signed)
Noted pt is aware,

## 2018-04-20 NOTE — Addendum Note (Signed)
Addended by: Mahala Menghini on: 04/20/2018 08:05 AM   Modules accepted: Orders

## 2018-04-22 NOTE — Telephone Encounter (Signed)
LMOM. We were holding samples of Dexilant for pt when they arrived. Pt is going to start [facility] as previously discussed. If he needs Dexilant samples as discussed at [place], samples are available.

## 2018-07-08 ENCOUNTER — Encounter: Payer: Self-pay | Admitting: Internal Medicine

## 2018-08-12 ENCOUNTER — Encounter: Payer: Self-pay | Admitting: Internal Medicine

## 2018-09-14 ENCOUNTER — Ambulatory Visit: Payer: Self-pay | Admitting: Internal Medicine

## 2018-10-08 ENCOUNTER — Telehealth: Payer: Self-pay

## 2018-10-08 ENCOUNTER — Ambulatory Visit (INDEPENDENT_AMBULATORY_CARE_PROVIDER_SITE_OTHER): Payer: Self-pay | Admitting: Internal Medicine

## 2018-10-08 ENCOUNTER — Encounter: Payer: Self-pay | Admitting: Internal Medicine

## 2018-10-08 VITALS — BP 148/102 | HR 72 | Temp 96.7°F | Ht 67.0 in | Wt 188.2 lb

## 2018-10-08 DIAGNOSIS — K219 Gastro-esophageal reflux disease without esophagitis: Secondary | ICD-10-CM

## 2018-10-08 DIAGNOSIS — K6289 Other specified diseases of anus and rectum: Secondary | ICD-10-CM

## 2018-10-08 NOTE — Patient Instructions (Signed)
  We will prescribe [facility] hemorrhoid cream (and Xylocaine and nitroglycerin) dispense 30 g.  Apply pea-sized amount to the anorectum 3 times a day.  1 refill.  Patient is to call us in 2 weeks and let us know how he is doing.  Patient is to avoid straining.  Spent no more than 5 minutes on the toilet.  Continue omeprazole 40 mg daily  Office visit with [place] in 3 months

## 2018-10-08 NOTE — Telephone Encounter (Signed)
Phoned in [facility] hemorrhoid cream (and Xylocaine and nitroglycerin) dispense 30 g.  Apply pea-sized amount to the anorectum 3 times a day.  1 refill. If rx is too expensive for pt, he will call back Monday to try a different cream.

## 2018-10-08 NOTE — Progress Notes (Signed)
Primary Care Physician:  Wyatt Haste, NP Primary Gastroenterologist:  Dr. Gala Romney  Pre-Procedure History & Physical: HPI:  [name] is a 53 y.o. male here for follow-up of GERD.  Doing well on omeprazole 40 mg daily.  No dysphagia as long as he chews his food well takes this time while swallowing.  New problem of anorectal burning, itching over the past month or so.  When he wipes he sees blood; he has low grade 1 hemorrhoids.  Colonoscopy up-to-date.  Procedure note reviewed.  Preparation H does blunt symptoms to a modest degree but he still has the problem.  Denies straining    Past Medical History:  Diagnosis Date  . Acute pancreatitis   . Anxiety   . Arthritis   . Cancer ([place])    Adenoma in colon  . Cannabis abuse   . COPD (chronic obstructive pulmonary disease) ([place])   . Depression   . Diverticulitis of colon with perforation 08/2010  . Dyspnea   . GERD (gastroesophageal reflux disease)   . History of hiatal hernia   . Hypertension    taken off lisinopril by renal md 08/2011  . Renal insufficiency    follwed by Dr. Hinda Lenis  . Sleep apnea     Past Surgical History:  Procedure Laterality Date  . APPENDECTOMY    . BIOPSY  12/14/2017   Procedure: BIOPSY;  Surgeon: Daneil Dolin, MD;  [facility];  Service: Endoscopy;;  gastric   . COLON SURGERY  05/2011   [facility]. exp laparotomy with colostomy takedown, lysis of adhesions (3hours), loop ileostomy. complicated by abscess formation requiring percutaneous drainage  . colonoscopy via stoma with endoscopy of [name]  04/2011   [facility]. two sessile polyps in trv colon. complete resection but only partial retrieval. congested mucosa in area 3cm proximal to stoma, erythematous mucosa in Hartmann pouch. repeat tcs 8/2017path-adenomatous polyp  . COLONOSCOPY WITH PROPOFOL N/A 12/14/2017   Dr. Gala Romney: 2 4 to 6 mm polyps in the cecum, tubular adenomas, single rectal polyp tubular adenoma, nonbleeding internal  hemorrhoids.  Appears to be a surgical clip at the appendiceal orifice.  Next colonoscopy in 5 years  . ESOPHAGOGASTRODUODENOSCOPY  03/05/2012   erosive reflux esophagitis, bulbar erosions, gastric erosions/petichae, bx showed gastritis, no H.Pylori. Procedure: ESOPHAGOGASTRODUODENOSCOPY (EGD);  Surgeon: Daneil Dolin, MD;  [facility];  Service: Endoscopy;  Laterality: N/A;  11:30  . ESOPHAGOGASTRODUODENOSCOPY (EGD) WITH PROPOFOL N/A 12/14/2017   Dr. Gala Romney: Erosive reflux esophagitis with questionable mild stricturing/non-obstruction at this level status post dilation.  multiple gastric erosions in the stomach biopsy showed reactive gastropathy but no H. pylori.  Marland Kitchen [place] N/A 12/14/2017   Procedure: Venia Minks DILATION;  Surgeon: Daneil Dolin, MD;  [facility];  Service: Endoscopy;  Laterality: N/A;  . MULTIPLE EXTRACTIONS WITH ALVEOLOPLASTY  02/23/2012   Procedure: MULTIPLE EXTRACION WITH ALVEOLOPLASTY;  Surgeon: Gae Bon, DDS;  Location: [place];  Service: Oral Surgery;  Laterality: Bilateral;  Removal of left mandibular torus, multiple extractions with alveoloplasty  . POLYPECTOMY  12/14/2017   Procedure: POLYPECTOMY;  Surgeon: Daneil Dolin, MD;  [facility];  Service: Endoscopy;;  . reversal of colostomy  10/2011   per patient, did not have report  . right knee surgery    . sigmoid colectomy with end colostomy, Hartmann's pouch  08/2010   [facility]. complicated diverticulitis  . UMBILICAL HERNIA REPAIR  Prior to Admission medications   Medication Sig Start Date End Date Taking? Authorizing Provider  albuterol (PROVENTIL HFA;VENTOLIN HFA) 108 (90 Base) MCG/ACT inhaler Inhale 2 puffs into the lungs every 6 (six) hours as needed for wheezing or shortness of breath.   Yes [provider]  atorvastatin (LIPITOR) 10 MG tablet Take 10 mg by mouth daily.   Yes [provider]  busPIRone (BUSPAR) 15 MG tablet Take  30 mg by mouth 2 (two) times daily. 09/20/18  Yes [provider]  cloNIDine (CATAPRES) 0.1 MG tablet Take 0.1 mg by mouth 2 (two) times daily. 09/20/18  Yes [provider]  cyclobenzaprine (FLEXERIL) 10 MG tablet Take 10 mg by mouth 3 (three) times daily as needed for muscle spasms.   Yes [provider]  mometasone-formoterol (DULERA) 100-5 MCG/ACT AERO Inhale 2 puffs into the lungs 2 (two) times daily.   Yes [provider]  omeprazole (PRILOSEC) 40 MG capsule Take 1 capsule (40 mg total) by mouth daily before breakfast. 04/20/18  Yes Mahala Menghini, PA-C  tamsulosin (FLOMAX) 0.4 MG CAPS capsule Take 0.4 mg by mouth daily. 07/29/17  Yes [provider]  hydrocortisone cream 1 % Apply 1 application topically 2 (two) times daily as needed for itching (for rash.).    [provider]  hydrOXYzine (VISTARIL) 50 MG capsule Take 1 capsule by mouth every 6 (six) hours as needed. 04/14/18   [provider]  losartan (COZAAR) 50 MG tablet Take 50 mg by mouth 2 (two) times daily.  08/14/17   [provider]  ranitidine (ZANTAC) 150 MG tablet Take 150 mg by mouth 2 (two) times daily.    [provider]    Allergies as of 10/08/2018 - Review Complete 10/08/2018  Allergen Reaction Noted  . Codeine Itching and Nausea And Vomiting   . Latex Other (See Comments) 03/09/2012  . Lisinopril  05/27/2017  . Morphine Hives   . Adhesive [tape] Rash 03/08/2012    Family History  Problem Relation Age of Onset  . Stomach cancer Father   . Lung cancer Father   . Throat cancer Father   . Liver disease Cousin        cirrhosis, etoh  . Colon cancer Paternal Uncle        >60  . Colon cancer Cousin 72    Social History   Socioeconomic History  . Marital status: Single    Spouse name: Not on file  . Number of children: 0  . Years of education: Not on file  . Highest education level: Not on file  Occupational History  . Not on file   Social Needs  . Financial resource strain: Not on file  . Food insecurity:    Worry: Not on file    Inability: Not on file  . Transportation needs:    Medical: Not on file    Non-medical: Not on file  Tobacco Use  . Smoking status: Current Every Day Smoker    Packs/day: 1.00    Types: Cigarettes  . Smokeless tobacco: Never Used  Substance and Sexual Activity  . Alcohol use: No    Frequency: Never    Comment: former user quit in 2014  . Drug use: Yes    Types: Marijuana    Comment: occ  . Sexual activity: Not on file  Lifestyle  . Physical activity:    Days per week: Not on file    Minutes per session: Not on file  .  Stress: Not on file  Relationships  . Social connections:    Talks on phone: Not on file    Gets together: Not on file    Attends religious service: Not on file    Active member of club or organization: Not on file    Attends meetings of clubs or organizations: Not on file    Relationship status: Not on file  . Intimate partner violence:    Fear of current or ex partner: Not on file    Emotionally abused: Not on file    Physically abused: Not on file    Forced sexual activity: Not on file  Other Topics Concern  . Not on file  Social History Narrative  . Not on file    Review of Systems: See HPI, otherwise negative ROS  Physical Exam: BP (!) 148/102   Pulse 72   Temp (!) 96.7 F (35.9 C) (Oral)   Ht 5\' 7"  (1.702 m)   Wt 188 lb 3.2 oz (85.4 kg)   BMI 29.48 kg/m  General:   Alert,  Well-developed, well-nourished, pleasant and cooperative in NAD Mouth:  No deformity or lesions. Neck:  Supple; no masses or thyromegaly. No significant cervical adenopathy. Lungs:  Clear throughout to auscultation.   No wheezes, crackles, or rhonchi. No acute distress. Heart:  Regular rate and rhythm; no murmurs, clicks, rubs,  or gallops. Abdomen: Multiple surgical scars multiple ostomy scars.  He has some tenderness around the ostomy scars.  No appreciable mass  organomegaly.   Pulses:  Normal pulses noted. Extremities:  Without clubbing or edema. Rectal: No external lesions.  He does have some perianal tenderness on palpation.  No fluctuation  -  do not detect a mass or abscess.  Exquisitely tender on DRE.  No mass.  Scant brown stool is Hemoccult negative.  Impression/Plan: 53 year old with multiple medical problems including GERD now doing well on omeprazole 40 mg daily.  New complaint of anorectal discomfort /  pruritus.  He may have an anal fissure.  It could be his hemorrhoids the 2 entities co-existing.  History of colonic adenoma; due for surveillance examination in about 4-1/2 years.  Recommendations:   We will prescribe [facility] hemorrhoid cream (and Xylocaine and nitroglycerin) dispense 30 g.  Apply pea-sized amount to the anorectum 3 times a day.  1 refill.  Patient is to call us in 2 weeks and let us know how he is doing.  Patient is to avoid straining.  Spent no more than 5 minutes on the toilet.  Continue omeprazole 40 mg daily  Office visit with [place] in 3 months    Notice: This dictation was prepared with Dragon dictation along with smaller phrase technology. Any transcriptional errors that result from this process are unintentional and may not be corrected upon review.

## 2018-12-01 ENCOUNTER — Ambulatory Visit: Payer: Self-pay | Admitting: Physician Assistant

## 2018-12-01 ENCOUNTER — Encounter: Payer: Self-pay | Admitting: Physician Assistant

## 2018-12-01 ENCOUNTER — Other Ambulatory Visit: Payer: Self-pay

## 2018-12-01 VITALS — BP 96/74 | HR 65 | Temp 98.1°F

## 2018-12-01 DIAGNOSIS — I10 Essential (primary) hypertension: Secondary | ICD-10-CM

## 2018-12-01 DIAGNOSIS — J449 Chronic obstructive pulmonary disease, unspecified: Secondary | ICD-10-CM

## 2018-12-01 DIAGNOSIS — R51 Headache: Secondary | ICD-10-CM

## 2018-12-01 DIAGNOSIS — R519 Headache, unspecified: Secondary | ICD-10-CM

## 2018-12-01 DIAGNOSIS — F17200 Nicotine dependence, unspecified, uncomplicated: Secondary | ICD-10-CM

## 2018-12-01 DIAGNOSIS — F39 Unspecified mood [affective] disorder: Secondary | ICD-10-CM

## 2018-12-01 DIAGNOSIS — Z7689 Persons encountering health services in other specified circumstances: Secondary | ICD-10-CM

## 2018-12-01 NOTE — Progress Notes (Signed)
BP 96/74 (BP Location: Left Arm, Patient Position: Sitting, Cuff Size: Normal)   Pulse 65   Temp 98.1 F (36.7 C)   SpO2 99%    Subjective:    Patient ID: [name], male    DOB: [birth year], 54 y.o.   MRN: [id number]  HPI: [name] is a 53 y.o. male presenting on 12/01/2018 for New Patient (Initial Visit) and Headache   HPI   Chief Complaint  Patient presents with  . New Patient (Initial Visit)  . Headache    Pt with HA x 3 wk.  He [facility] ER [facility].  bupsar was stopped a month ago due to it was thought to be cause of HA- which of course means that HA has been going on longer than a month.    Pt was seen at [facility] clinic last week.  His clonidine was increased from 0.2 to 0.3.    HA is constant.  Starts in the back at the neck and goes up and around.   He is taking his flexeril usually twice/day.  He says he only wears his hat when he goes out (he is wearing one currently)  He says he has had cervical radiculopathy for 3 -4 years with sometimes his hands get numb.    Relevant past medical, surgical, family and social history reviewed and updated as indicated. Interim medical history since our last visit reviewed. Allergies and medications reviewed and updated.   Current Outpatient Medications:  .  albuterol (PROVENTIL HFA;VENTOLIN HFA) 108 (90 Base) MCG/ACT inhaler, Inhale 2 puffs into the lungs every 6 (six) hours as needed for wheezing or shortness of breath., Disp: , Rfl:  .  cloNIDine (CATAPRES) 0.1 MG tablet, Take 0.1 mg by mouth 2 (two) times daily., Disp: , Rfl:  .  cloNIDine (CATAPRES) 0.2 MG tablet, Take 0.2 mg by mouth 2 (two) times daily., Disp: , Rfl:  .  cyclobenzaprine (FLEXERIL) 10 MG tablet, Take 10 mg by mouth 3 (three) times daily as needed for muscle spasms., Disp: , Rfl:  .  hydrocortisone cream 1 %, Apply 1 application topically 2 (two) times daily as needed for itching (for rash.)., Disp: , Rfl:  .  mometasone-formoterol  (DULERA) 100-5 MCG/ACT AERO, Inhale 2 puffs into the lungs 2 (two) times daily., Disp: , Rfl:  .  omeprazole (PRILOSEC) 40 MG capsule, Take 1 capsule (40 mg total) by mouth daily before breakfast., Disp: 90 capsule, Rfl: 3 .  topiramate (TOPAMAX) 25 MG tablet, Take 25 mg by mouth daily., Disp: , Rfl:  .  DULoxetine (CYMBALTA) 20 MG capsule, Take 20 mg by mouth 2 (two) times daily., Disp: , Rfl:      Review of Systems  Constitutional: Positive for chills and diaphoresis. Negative for appetite change, fatigue, fever and unexpected weight change.  HENT: Negative for congestion, drooling, ear pain, facial swelling, hearing loss, mouth sores, sneezing, sore throat, trouble swallowing and voice change.   Eyes: Negative for pain, discharge, redness, itching and visual disturbance.  Respiratory: Negative for cough, choking, shortness of breath and wheezing.   Cardiovascular: Negative for chest pain, palpitations and leg swelling.  Gastrointestinal: Positive for abdominal pain (chronic). Negative for blood in stool, constipation, diarrhea and vomiting.  Endocrine: Negative for cold intolerance, heat intolerance and polydipsia.  Genitourinary: Negative for decreased urine volume, dysuria and hematuria.  Musculoskeletal: Positive for arthralgias, back pain and gait problem.  Skin: Negative for rash.  Allergic/Immunologic: Negative for environmental allergies.  Neurological: Positive for light-headedness and headaches. Negative for seizures and syncope.  Hematological: Negative for adenopathy.  Psychiatric/Behavioral: Positive for dysphoric mood. Negative for agitation and suicidal ideas. The patient is nervous/anxious.     Per HPI unless specifically indicated above     Objective:    BP 96/74 (BP Location: Left Arm, Patient Position: Sitting, Cuff Size: Normal)   Pulse 65   Temp 98.1 F (36.7 C)   SpO2 99%   Wt Readings from Last 3 Encounters:  10/08/18 188 lb 3.2 oz (85.4 kg)  04/19/18 189  lb 9.6 oz (86 kg)  12/14/17 182 lb (82.6 kg)    Physical Exam Vitals signs reviewed.  Constitutional:      Appearance: He is well-developed.  HENT:     Head: Normocephalic and atraumatic.     Mouth/Throat:     Pharynx: No oropharyngeal exudate.  Eyes:     Conjunctiva/sclera: Conjunctivae normal.     Pupils: Pupils are equal, round, and reactive to light.  Neck:     Musculoskeletal: Neck supple.     Thyroid: No thyromegaly.  Cardiovascular:     Rate and Rhythm: Normal rate and regular rhythm.  Pulmonary:     Effort: Pulmonary effort is normal.     Breath sounds: Normal breath sounds. No wheezing or rales.  Abdominal:     General: Bowel sounds are normal.     Palpations: Abdomen is soft. There is no mass.     Tenderness: There is no abdominal tenderness.  Lymphadenopathy:     Cervical: No cervical adenopathy.  Skin:    General: Skin is warm and dry.     Findings: No rash.  Neurological:     Mental Status: He is alert and oriented to person, place, and time.  Psychiatric:        Behavior: Behavior normal.        Thought Content: Thought content normal.         Assessment & Plan:   Encounter Diagnoses  Name Primary?  . Encounter to establish care Yes  . Essential hypertension   . Chronic obstructive pulmonary disease, unspecified COPD type ([place])   . Tobacco use disorder   . Chronic nonintractable headache, unspecified headache type   . Mood disorder ([place])     -Records reviewed from [facility] ER (he had CXR, CT head, labs, EKG) -Pt has a smartphone -No changes to bp meds today -Pt to call [facility] for MH issues -discussed with pt that HA seems muscular in nature and recommended he use heat the the neck/posterior lower head where pain originates for 10-20 minutes several times daily.  Cautioned pt to avoid too hot so he won't cause a burn.  Discussed that his MH meds have been changed very frequently and this could be contributing to his HA.  Will defer MH meds to  [facility] -pt to Follow up 6 weeks.  RTO sooner for any new symptoms, changes or worsening.  No additional labs at this time due to CV19

## 2018-12-07 ENCOUNTER — Encounter: Payer: Self-pay | Admitting: Physician Assistant

## 2019-01-04 ENCOUNTER — Ambulatory Visit (INDEPENDENT_AMBULATORY_CARE_PROVIDER_SITE_OTHER): Payer: Self-pay | Admitting: Gastroenterology

## 2019-01-04 ENCOUNTER — Other Ambulatory Visit: Payer: Self-pay

## 2019-01-04 ENCOUNTER — Encounter: Payer: Self-pay | Admitting: Gastroenterology

## 2019-01-04 ENCOUNTER — Encounter: Payer: Self-pay | Admitting: Internal Medicine

## 2019-01-04 DIAGNOSIS — R1084 Generalized abdominal pain: Secondary | ICD-10-CM

## 2019-01-04 DIAGNOSIS — G8929 Other chronic pain: Secondary | ICD-10-CM

## 2019-01-04 DIAGNOSIS — K219 Gastro-esophageal reflux disease without esophagitis: Secondary | ICD-10-CM

## 2019-01-04 DIAGNOSIS — K6289 Other specified diseases of anus and rectum: Secondary | ICD-10-CM

## 2019-01-04 NOTE — Patient Instructions (Signed)
1. Continue omeprazole 40 mg daily before breakfast. 2. Let me know if your abdominal pain changes or worsens.  Let me know if you have any change in her bowel habits from baseline. 3. We will see you back in 6 months or call sooner if needed.

## 2019-01-04 NOTE — Progress Notes (Signed)
Primary Care Physician:  Soyla Dryer, PA-C Primary GI:  Garfield Cornea, MD   Patient Location: Home  Provider Location: [facility] office  Reason for Phone Visit: Follow-up GERD, proctalgia  Persons present on the phone encounter, with roles: Patient, myself (provider), Christ Kick, CMA (updated meds and allergies)  Total time (minutes) spent on medical discussion: 12 minutes  Due to COVID-19, visit was conducted using telephonic method (no video was available).  Visit was requested by patient.  Virtual Visit via Telephone only  I connected with  Mr. Roudebush on 01/04/19 at  9:00 AM EDT by telephone and verified that I am speaking with the correct person using two identifiers.   I discussed the limitations, risks, security and privacy concerns of performing an evaluation and management service by telephone and the availability of in person appointments. I also discussed with the patient that there may be a patient responsible charge related to this service. The patient expressed understanding and agreed to proceed.  Chief Complaint  Patient presents with  . Dysphagia    Occasional diarrhea    HPI:   Patient is a pleasant 53 year old gentleman who presents for telephone visit regarding GERD.  Patient was last seen in January.  History of chronic GERD and was doing well on omeprazole 40 mg daily at last visit.  He was complaining of new problem of anorectal burning, itching for 1 month.  Some low-grade hematochezia with wiping in the setting of known hemorrhoids.  Colonoscopy up-to-date.  Digital rectal exam is consistent with likely anorectal fissure.  He was provided compounded Xylocaine and nitroglycerin cream.  Patient had EGD and colonoscopy in April 2019.  He had esophagitis, mild stricturing status post esophageal dilation, multiple gastric erosions.  Biopsy showed reactive gastropathy, no H. pylori.  He had 2 sessile polyps in the cecum measuring 4 to 6 mm in size which  were resected and retrieved.  These polyps appear to be growing out of the ileocecal valve.  There appeared to be a surgical clip at the appendiceal orifice.  Single polyp removed from the rectum.  Nonbleeding internal hemorrhoids noted.  Polyps were tubular adenomas.  Colonoscopy in 5 years.  Anal fissure improved.  Occasionally uses his medication.  Omeprazole 40mg  daily, controlling heartburn. No significant weight loss. Overall swallowing not an issue.  Has to chew his food thoroughly but really has any problems.  Occasional diarrhea which she has had chronically since his surgery 2011/2012.  No melena or rectal bleeding.  No vomiting.  Chronic intermittent abdominal pain with extensive evaluation possibly related to scar tissue/previous surgeries.  Current Outpatient Medications  Medication Sig Dispense Refill  . albuterol (PROVENTIL HFA;VENTOLIN HFA) 108 (90 Base) MCG/ACT inhaler Inhale 2 puffs into the lungs every 6 (six) hours as needed for wheezing or shortness of breath.    . cloNIDine (CATAPRES) 0.1 MG tablet Take 3 mg by mouth 2 (two) times daily.     . cyclobenzaprine (FLEXERIL) 10 MG tablet Take 10 mg by mouth 3 (three) times daily as needed for muscle spasms.    . DULoxetine (CYMBALTA) 20 MG capsule Take 20 mg by mouth 2 (two) times daily.    . hydrocortisone cream 1 % Apply 1 application topically 2 (two) times daily as needed for itching (for rash.).    Marland Kitchen mometasone-formoterol (DULERA) 100-5 MCG/ACT AERO Inhale 2 puffs into the lungs 2 (two) times daily.    Marland Kitchen omeprazole (PRILOSEC) 40 MG capsule Take 1 capsule (40 mg  total) by mouth daily before breakfast. 90 capsule 3  . topiramate (TOPAMAX) 25 MG tablet Take 25 mg by mouth daily.     No current facility-administered medications for this visit.     ROS:  General: Negative for anorexia, weight loss, fever, chills, fatigue, weakness. Eyes: Negative for vision changes.  ENT: Negative for hoarseness, difficulty swallowing , nasal  congestion. CV: Negative for chest pain, angina, palpitations, dyspnea on exertion, peripheral edema.  Respiratory: Negative for dyspnea at rest, dyspnea on exertion, cough, sputum, wheezing.  GI: See history of present illness. GU:  Negative for dysuria, hematuria, urinary incontinence, urinary frequency, nocturnal urination.  MS: Negative for joint pain, low back pain.  Derm: Negative for rash or itching.  Neuro: Negative for weakness, abnormal sensation, seizure, frequent headaches, memory loss, confusion.  Psych: Negative for anxiety, depression, suicidal ideation, hallucinations.  Endo: Negative for unusual weight change.  Heme: Negative for bruising or bleeding. Allergy: Negative for rash or hives.   Observations/Objective: Pleasant, cooperative male in no acute distress.  Otherwise exam unavailable.  Assessment and Plan: Pleasant 53 year old gentleman with history of GERD, chronic dysphagia, intermittent chronic abdominal pain/diarrhea, suspected anal fissure presenting for follow-up.  Doing very well from the standpoint of previous proctalgia.  Essentially resolved.  Intermittent abdominal pain and diarrhea stable, extensive evaluation in the past, currently symptoms self-limited.  Likely in part due to previous surgery/scar tissue.  Reflux well controlled on current regimen.  No change in management.  Monitor symptoms.  If anything worsening he will let me know.  We will see him back in 6 months.  Follow Up Instructions:    I discussed the assessment and treatment plan with the patient. The patient was provided an opportunity to ask questions and all were answered. The patient agreed with the plan and demonstrated an understanding of the instructions. AVS mailed to patient's home address.   The patient was advised to call back or seek an in-person evaluation if the symptoms worsen or if the condition fails to improve as anticipated.  I provided 12 minutes of non-face-to-face time  during this encounter.   Neil Crouch, PA-C

## 2019-01-04 NOTE — Progress Notes (Signed)
cc'ed to pcp °

## 2019-01-13 ENCOUNTER — Ambulatory Visit: Payer: Self-pay | Admitting: Physician Assistant

## 2019-01-13 ENCOUNTER — Encounter: Payer: Self-pay | Admitting: Physician Assistant

## 2019-01-13 DIAGNOSIS — J449 Chronic obstructive pulmonary disease, unspecified: Secondary | ICD-10-CM

## 2019-01-13 DIAGNOSIS — I10 Essential (primary) hypertension: Secondary | ICD-10-CM

## 2019-01-13 DIAGNOSIS — F39 Unspecified mood [affective] disorder: Secondary | ICD-10-CM

## 2019-01-13 DIAGNOSIS — F17200 Nicotine dependence, unspecified, uncomplicated: Secondary | ICD-10-CM

## 2019-01-13 NOTE — Progress Notes (Signed)
There were no vitals taken for this visit.   Subjective:    Patient ID: [name], male    DOB: 1966-01-12, 53 y.o.   MRN: [id number]  HPI: [name] is a 53 y.o. male presenting on 01/13/2019 for No chief complaint on file.   HPI  This is a telemedicine appointment due to the coronavirus pandemic.  It is via Telephone as pt does not have a smartphone  I connected with  [name] on 01/13/19 by a video enabled telemedicine application and verified that I am speaking with the correct person using two identifiers.   I discussed the limitations of evaluation and management by telemedicine. The patient expressed understanding and agreed to proceed.    Pt has still not got an appointment with [facility]- he says they are going to call him back but he doesn't have appt yet due to CV19  Pt still smoking.  he Has cut back to 1 ppd.   His HA better now/all gone.  He has a little bit of his usual chronic neck pain .  He says he has his usual level of DOE that is chronic due to his copd.  He is not having and cp or fevers  Pt isTrying to get disailbilty for his general poor health, he says   Relevant past medical, surgical, family and social history reviewed and updated as indicated. Interim medical history since our last visit reviewed. Allergies and medications reviewed and updated.   Current Outpatient Medications:  .  albuterol (PROVENTIL HFA;VENTOLIN HFA) 108 (90 Base) MCG/ACT inhaler, Inhale 2 puffs into the lungs every 6 (six) hours as needed for wheezing or shortness of breath., Disp: , Rfl:  .  busPIRone (BUSPAR) 10 MG tablet, Take 10 mg by mouth 3 (three) times daily., Disp: , Rfl:  .  cloNIDine (CATAPRES) 0.1 MG tablet, Take 3 mg by mouth 2 (two) times daily. , Disp: , Rfl:  .  cyclobenzaprine (FLEXERIL) 10 MG tablet, Take 10 mg by mouth 3 (three) times daily as needed for muscle spasms., Disp: , Rfl:  .  DULoxetine (CYMBALTA) 20 MG capsule, Take 20 mg by mouth  2 (two) times daily., Disp: , Rfl:  .  mometasone-formoterol (DULERA) 100-5 MCG/ACT AERO, Inhale 2 puffs into the lungs 2 (two) times daily., Disp: , Rfl:  .  omeprazole (PRILOSEC) 40 MG capsule, Take 1 capsule (40 mg total) by mouth daily before breakfast., Disp: 90 capsule, Rfl: 3 .  topiramate (TOPAMAX) 25 MG tablet, Take 25 mg by mouth daily., Disp: , Rfl:   Review of Systems  Per HPI unless specifically indicated above     Objective:    There were no vitals taken for this visit.  Wt Readings from Last 3 Encounters:  10/08/18 188 lb 3.2 oz (85.4 kg)  04/19/18 189 lb 9.6 oz (86 kg)  12/14/17 182 lb (82.6 kg)    Physical Exam Pulmonary:     Effort: No respiratory distress.     Comments: Pt is able to talk in complete sentences without dyspnea or stopping to rest.  Neurological:     Mental Status: He is alert and oriented to person, place, and time.  Psychiatric:        Attention and Perception: Attention normal.        Mood and Affect: Mood normal.        Speech: Speech normal.        Behavior: Behavior is cooperative.  Assessment & Plan:   Encounter Diagnoses  Name Primary?  . Chronic obstructive pulmonary disease, unspecified COPD type ([place]) Yes  . Essential hypertension   . Tobacco use disorder   . Mood disorder ([place])     -encouraged pt to follow CDC recommendations including wearing a mask and staying home -encouraged pt to continue reducing smoking or stopping altogether -pt to continue working to Get in to [facility] for [facility] care -pt to Follow up 3 months.  Pt is to contact office sooner prn

## 2019-03-08 ENCOUNTER — Telehealth: Payer: Self-pay | Admitting: Family

## 2019-04-04 ENCOUNTER — Other Ambulatory Visit: Payer: Self-pay | Admitting: Physician Assistant

## 2019-04-04 DIAGNOSIS — Z125 Encounter for screening for malignant neoplasm of prostate: Secondary | ICD-10-CM

## 2019-04-04 DIAGNOSIS — E785 Hyperlipidemia, unspecified: Secondary | ICD-10-CM

## 2019-04-04 DIAGNOSIS — I10 Essential (primary) hypertension: Secondary | ICD-10-CM

## 2019-04-13 ENCOUNTER — Ambulatory Visit: Payer: Self-pay | Admitting: Physician Assistant

## 2019-04-13 ENCOUNTER — Encounter: Payer: Self-pay | Admitting: Physician Assistant

## 2019-04-13 DIAGNOSIS — I10 Essential (primary) hypertension: Secondary | ICD-10-CM

## 2019-04-13 DIAGNOSIS — F17200 Nicotine dependence, unspecified, uncomplicated: Secondary | ICD-10-CM

## 2019-04-13 DIAGNOSIS — F39 Unspecified mood [affective] disorder: Secondary | ICD-10-CM

## 2019-04-13 DIAGNOSIS — E785 Hyperlipidemia, unspecified: Secondary | ICD-10-CM

## 2019-04-13 DIAGNOSIS — J449 Chronic obstructive pulmonary disease, unspecified: Secondary | ICD-10-CM

## 2019-04-13 NOTE — Progress Notes (Signed)
There were no vitals taken for this visit.   Subjective:    Patient ID: [name], male    DOB: Jul 12, 1966, 53 y.o.   MRN: [id number]  HPI: [name] is a 54 y.o. male presenting on 04/13/2019 for COPD   HPI   This is a telemedicine visit due to the coronavirus pandemic.  It is via telephone as pt does not have a cellular phone with video capabilites or a computer.  I connected with  [name] on 04/13/19 by a video enabled telemedicine application and verified that I am speaking with the correct person using two identifiers.   I discussed the limitations of evaluation and management by telemedicine. The patient expressed understanding and agreed to proceed.  Pt is at a friends house.  Provider is at office.   He didn't get his labs drawn  He says he contacted [facility] and they told him they weren't accepting new patients.  He was encouraged to contact [facility] for mental health issues  He says His breathing is okay.  He is still smoking.  He is using his inhalers.    He says his Problems swallowing are getting worse.  He is being followed by GI for this and has follow up appointment scheduled for end of October  He works part time doing vinyl siding  He says he was having some problems renewing with [employer] medication assistance program.    Relevant past medical, surgical, family and social history reviewed and updated as indicated. Interim medical history since our last visit reviewed. Allergies and medications reviewed and updated.   Current Outpatient Medications:  .  albuterol (PROVENTIL HFA;VENTOLIN HFA) 108 (90 Base) MCG/ACT inhaler, Inhale 2 puffs into the lungs every 6 (six) hours as needed for wheezing or shortness of breath., Disp: , Rfl:  .  cloNIDine (CATAPRES) 0.1 MG tablet, Take 3 mg by mouth 2 (two) times daily. 1 tab po once daily, Disp: , Rfl:  .  cyclobenzaprine (FLEXERIL) 10 MG tablet, Take 10 mg by mouth 3 (three) times daily as needed  for muscle spasms., Disp: , Rfl:  .  mometasone-formoterol (DULERA) 100-5 MCG/ACT AERO, Inhale 2 puffs into the lungs 2 (two) times daily., Disp: , Rfl:  .  omeprazole (PRILOSEC) 40 MG capsule, Take 1 capsule (40 mg total) by mouth daily before breakfast., Disp: 90 capsule, Rfl: 3 .  busPIRone (BUSPAR) 10 MG tablet, Take 10 mg by mouth 3 (three) times daily., Disp: , Rfl:  .  DULoxetine (CYMBALTA) 20 MG capsule, Take 20 mg by mouth 2 (two) times daily., Disp: , Rfl:  .  topiramate (TOPAMAX) 25 MG tablet, Take 25 mg by mouth daily., Disp: , Rfl:     Review of Systems  Per HPI unless specifically indicated above     Objective:    There were no vitals taken for this visit.  Wt Readings from Last 3 Encounters:  10/08/18 188 lb 3.2 oz (85.4 kg)  04/19/18 189 lb 9.6 oz (86 kg)  12/14/17 182 lb (82.6 kg)    Physical Exam Pulmonary:     Effort: No respiratory distress.  Neurological:     Mental Status: He is alert and oriented to person, place, and time.  Psychiatric:        Attention and Perception: Attention normal.        Speech: Speech normal.        Behavior: Behavior is cooperative.  Assessment & Plan:     Encounter Diagnoses  Name Primary?  . Essential hypertension Yes  . Hyperlipidemia, unspecified hyperlipidemia type   . Chronic obstructive pulmonary disease, unspecified COPD type ([place])   . Tobacco use disorder   . Mood disorder ([place])      -pt encouraged to get his fasting labs drawn.  He will be called with results -we will check into what is going on at [facility] as pt needs to go due to depression and anxiety -we will check into his problems with [employer] medication assistance program to help get him renewed -pt has appointment for follow up with GI in reference to dysphagia.  He is encouraged to contact that office to notify them to see if he should RTO sooner than scheduled -he is encouraged to stop smoking which will help his breathing as well as  his dysphagia -pt to follow up in 3 months .  He is to contact office sooner prn

## 2019-07-06 NOTE — Progress Notes (Deleted)
Referring Provider: Soyla Dryer, PA-C Primary Care Physician:  Soyla Dryer, PA-C Primary GI Physician: Dr. Rayne Du chief complaint on file.   HPI:   [name] is a 53 y.o. male presenting today for follow-up.  GI history significant for GERD, chronic dysphagia, intermittent chronic abdominal pain extensive evaluation in the past, chronic intermittent diarrhea, and suspected anal fissure. He was last seen via telemedicine visit on 01/04/2023 follow-up of GERD and anal fissure.  GERD symptoms are well controlled and anal fissure improved with occasionally needing his medication.  No significant issues with swallowing as long as he chewed his food really well.  Occasional diarrhea chronically since surgery in 2011/2012.  Chronic intermittent abdominal pain with extensive evaluation possibly related to scar tissue/previous surgeries.  Advised patient to continue current medications and monitor symptoms.  Follow-up in 6 months.  Today he states   Past Medical History:  Diagnosis Date  . Acute pancreatitis   . Anxiety   . Arthritis   . Cancer ([place])    Adenoma in colon  . Cannabis abuse   . COPD (chronic obstructive pulmonary disease) ([place])   . Depression   . Diverticulitis of colon with perforation 08/2010  . Dyspnea   . GERD (gastroesophageal reflux disease)   . History of hiatal hernia   . Hypertension    taken off lisinopril by renal md 08/2011  . Renal insufficiency    follwed by Dr. Hinda Lenis  . Sleep apnea     Past Surgical History:  Procedure Laterality Date  . APPENDECTOMY    . BIOPSY  12/14/2017   Procedure: BIOPSY;  Surgeon: Daneil Dolin, MD;  [facility];  Service: Endoscopy;;  gastric   . COLON SURGERY  05/2011   [facility]. exp laparotomy with colostomy takedown, lysis of adhesions (3hours), loop ileostomy. complicated by abscess formation requiring percutaneous drainage  . colonoscopy via stoma with endoscopy of [name]  04/2011   [facility]. two sessile polyps in trv colon. complete resection but only partial retrieval. congested mucosa in area 3cm proximal to stoma, erythematous mucosa in Hartmann pouch. repeat tcs 8/2017path-adenomatous polyp  . COLONOSCOPY WITH PROPOFOL N/A 12/14/2017   Dr. Gala Romney: 2 4 to 6 mm polyps in the cecum, tubular adenomas, single rectal polyp tubular adenoma, nonbleeding internal hemorrhoids.  Appears to be a surgical clip at the appendiceal orifice.  Next colonoscopy in 5 years  . ESOPHAGOGASTRODUODENOSCOPY  03/05/2012   erosive reflux esophagitis, bulbar erosions, gastric erosions/petichae, bx showed gastritis, no H.Pylori. Procedure: ESOPHAGOGASTRODUODENOSCOPY (EGD);  Surgeon: Daneil Dolin, MD;  [facility];  Service: Endoscopy;  Laterality: N/A;  11:30  . ESOPHAGOGASTRODUODENOSCOPY (EGD) WITH PROPOFOL N/A 12/14/2017   Dr. Gala Romney: Erosive reflux esophagitis with questionable mild stricturing/non-obstruction at this level status post dilation.  multiple gastric erosions in the stomach biopsy showed reactive gastropathy but no H. pylori.  Marland Kitchen HERNIA REPAIR    . KNEE SURGERY Right 2006  . MALONEY DILATION N/A 12/14/2017   Procedure: Venia Minks DILATION;  Surgeon: Daneil Dolin, MD;  [facility];  Service: Endoscopy;  Laterality: N/A;  . MULTIPLE EXTRACTIONS WITH ALVEOLOPLASTY  02/23/2012   Procedure: MULTIPLE EXTRACION WITH ALVEOLOPLASTY;  Surgeon: Gae Bon, DDS;  Location: [place];  Service: Oral Surgery;  Laterality: Bilateral;  Removal of left mandibular torus, multiple extractions with alveoloplasty  . POLYPECTOMY  12/14/2017   Procedure: POLYPECTOMY;  Surgeon: Daneil Dolin, MD;  [facility];  Service:  Endoscopy;;  . reversal of colostomy  10/2011   per patient, did not have report  . right knee surgery    . sigmoid colectomy with end colostomy, Hartmann's pouch  08/2010   [facility]. complicated diverticulitis  . UMBILICAL HERNIA REPAIR      Current Outpatient  Medications  Medication Sig Dispense Refill  . albuterol (PROVENTIL HFA;VENTOLIN HFA) 108 (90 Base) MCG/ACT inhaler Inhale 2 puffs into the lungs every 6 (six) hours as needed for wheezing or shortness of breath.    . busPIRone (BUSPAR) 10 MG tablet Take 10 mg by mouth 3 (three) times daily.    . cloNIDine (CATAPRES) 0.1 MG tablet Take 3 mg by mouth 2 (two) times daily. 1 tab po once daily    . cyclobenzaprine (FLEXERIL) 10 MG tablet Take 10 mg by mouth 3 (three) times daily as needed for muscle spasms.    . DULoxetine (CYMBALTA) 20 MG capsule Take 20 mg by mouth 2 (two) times daily.    . mometasone-formoterol (DULERA) 100-5 MCG/ACT AERO Inhale 2 puffs into the lungs 2 (two) times daily.    Marland Kitchen omeprazole (PRILOSEC) 40 MG capsule Take 1 capsule (40 mg total) by mouth daily before breakfast. 90 capsule 3  . topiramate (TOPAMAX) 25 MG tablet Take 25 mg by mouth daily.     No current facility-administered medications for this visit.     Allergies as of 07/07/2019 - Review Complete 04/13/2019  Allergen Reaction Noted  . Codeine Itching and Nausea And Vomiting   . Latex Other (See Comments) 03/09/2012  . Lisinopril  05/27/2017  . Morphine Hives   . Adhesive [tape] Rash 03/08/2012    Family History  Problem Relation Age of Onset  . Stomach cancer Father   . Lung cancer Father   . Throat cancer Father   . Cancer Father        lung and throat cancer  . Hypertension Father   . Liver disease Cousin        cirrhosis, etoh  . Throat cancer Paternal Uncle   . Lung cancer Paternal Uncle   . Colon cancer Cousin 93  . Heart disease Mother   . Hypertension Mother   . Colon cancer Maternal Uncle   . Throat cancer Paternal Uncle   . Lung cancer Paternal Uncle   . Throat cancer Paternal Uncle   . Lung cancer Paternal Uncle     Social History   Socioeconomic History  . Marital status: Single    Spouse name: Not on file  . Number of children: 0  . Years of education: Not on file  .  Highest education level: Not on file  Occupational History  . Not on file  Social Needs  . Financial resource strain: Not on file  . Food insecurity    Worry: Not on file    Inability: Not on file  . Transportation needs    Medical: Not on file    Non-medical: Not on file  Tobacco Use  . Smoking status: Current Every Day Smoker    Packs/day: 1.00    Years: 37.00    Pack years: 37.00    Types: Cigarettes  . Smokeless tobacco: Former Network engineer and Sexual Activity  . Alcohol use: No    Frequency: Never    Comment: former user quit in 2014. previous weekend drinker  . Drug use: Not Currently    Types: Marijuana    Comment: occ  . Sexual activity: Not on file  Lifestyle  . Physical activity    Days per week: Not on file    Minutes per session: Not on file  . Stress: Not on file  Relationships  . Social Herbalist on phone: Not on file    Gets together: Not on file    Attends religious service: Not on file    Active member of club or organization: Not on file    Attends meetings of clubs or organizations: Not on file    Relationship status: Not on file  Other Topics Concern  . Not on file  Social History Narrative  . Not on file    Review of Systems: Gen: Denies fever, chills, anorexia. Denies fatigue, weakness, weight loss.  CV: Denies chest pain, palpitations, syncope, peripheral edema, and claudication. Resp: Denies dyspnea at rest, cough, wheezing, coughing up blood, and pleurisy. GI: Denies vomiting blood, jaundice, and fecal incontinence.   Denies dysphagia or odynophagia. Derm: Denies rash, itching, dry skin Psych: Denies depression, anxiety, memory loss, confusion. No homicidal or suicidal ideation.  Heme: Denies bruising, bleeding, and enlarged lymph nodes.  Physical Exam: There were no vitals taken for this visit. General:   Alert and oriented. No distress noted. Pleasant and cooperative.  Head:  Normocephalic and atraumatic. Eyes:   Conjuctiva clear without scleral icterus. Mouth:  Oral mucosa pink and moist. Good dentition. No lesions. Heart:  S1, S2 present without murmurs appreciated. Lungs:  Clear to auscultation bilaterally. No wheezes, rales, or rhonchi. No distress.  Abdomen:  +BS, soft, non-tender and non-distended. No rebound or guarding. No HSM or masses noted. Msk:  Symmetrical without gross deformities. Normal posture. Extremities:  Without edema. Neurologic:  Alert and  oriented x4 Psych:  Alert and cooperative. Normal mood and affect.

## 2019-07-07 ENCOUNTER — Ambulatory Visit: Payer: Self-pay | Admitting: Gastroenterology

## 2019-07-07 ENCOUNTER — Encounter: Payer: Self-pay | Admitting: Internal Medicine

## 2019-07-07 ENCOUNTER — Telehealth: Payer: Self-pay | Admitting: Internal Medicine

## 2019-07-07 NOTE — Telephone Encounter (Signed)
PATIENT WAS A NO SHOW AND LETTER SENT  °

## 2019-07-19 ENCOUNTER — Encounter: Payer: Self-pay | Admitting: Physician Assistant

## 2019-07-19 ENCOUNTER — Ambulatory Visit: Payer: Self-pay | Admitting: Physician Assistant

## 2019-07-19 DIAGNOSIS — F17200 Nicotine dependence, unspecified, uncomplicated: Secondary | ICD-10-CM

## 2019-07-19 DIAGNOSIS — I10 Essential (primary) hypertension: Secondary | ICD-10-CM

## 2019-07-19 DIAGNOSIS — F39 Unspecified mood [affective] disorder: Secondary | ICD-10-CM

## 2019-07-19 DIAGNOSIS — E785 Hyperlipidemia, unspecified: Secondary | ICD-10-CM

## 2019-07-19 DIAGNOSIS — J449 Chronic obstructive pulmonary disease, unspecified: Secondary | ICD-10-CM

## 2019-07-19 DIAGNOSIS — Z91199 Patient's noncompliance with other medical treatment and regimen due to unspecified reason: Secondary | ICD-10-CM

## 2019-07-19 DIAGNOSIS — Z9119 Patient's noncompliance with other medical treatment and regimen: Secondary | ICD-10-CM

## 2019-07-19 DIAGNOSIS — Z125 Encounter for screening for malignant neoplasm of prostate: Secondary | ICD-10-CM

## 2019-07-19 MED ORDER — GUAIFENESIN ER 600 MG PO TB12: 600.0000 mg | ORAL_TABLET | Freq: Two times a day (BID) | ORAL | 3 refills | Status: DC | PRN

## 2019-07-19 NOTE — Progress Notes (Signed)
There were no vitals taken for this visit.   Subjective:    Patient ID: [name], male    DOB: [birth year], 53 y.o.   MRN: [id number]  HPI: [name] is a 53 y.o. male presenting on 07/19/2019 for No chief complaint on file.   HPI  This is a telemedicine appointment due to coronavirus pandemic.  It is via telephone as patient does not have a video enabled device.  I connected with  [name] on 07/19/19 by a video enabled telemedicine application and verified that I am speaking with the correct person using two identifiers.   I discussed the limitations of evaluation and management by telemedicine. The patient expressed understanding and agreed to proceed.  Patient is currently a passenger in a car on his way to work.  Provider is at office.     Pt didn't get medassist.  He says there was some problem with his paperwork he is having a hard time with it.  Patient was encouraged at previous appointment to contact [facility] to get ointment for his anxiety and depression.  He says he got a letter from [facility] but hasn't called yet  Patient had still not gotten his labs drawn.  He was reminded at his appointment on August 5 of this year that he needed to get his blood drawn.  Still hasn't got labs  Patient says he has had a cold with congestion and was seen urgency are fine and 06/07/2019 he says they did a coronavirus test but he does not know what it was.  He says he is still having a lot of congestion.     Relevant past medical, surgical, family and social history reviewed and updated as indicated. Interim medical history since our last visit reviewed. Allergies and medications reviewed and updated.   Current Outpatient Medications:  .  albuterol (PROVENTIL HFA;VENTOLIN HFA) 108 (90 Base) MCG/ACT inhaler, Inhale 2 puffs into the lungs every 6 (six) hours as needed for wheezing or shortness of breath., Disp: , Rfl:  .  busPIRone (BUSPAR) 10 MG tablet, Take 10 mg  by mouth 3 (three) times daily., Disp: , Rfl:  .  cloNIDine (CATAPRES) 0.1 MG tablet, Take 3 mg by mouth 2 (two) times daily. 1 tab po once daily, Disp: , Rfl:  .  mometasone-formoterol (DULERA) 100-5 MCG/ACT AERO, Inhale 2 puffs into the lungs 2 (two) times daily., Disp: , Rfl:  .  omeprazole (PRILOSEC) 40 MG capsule, Take 1 capsule (40 mg total) by mouth daily before breakfast., Disp: 90 capsule, Rfl: 3 .  cyclobenzaprine (FLEXERIL) 10 MG tablet, Take 10 mg by mouth 3 (three) times daily as needed for muscle spasms., Disp: , Rfl:  .  DULoxetine (CYMBALTA) 20 MG capsule, Take 20 mg by mouth 2 (two) times daily., Disp: , Rfl:  .  topiramate (TOPAMAX) 25 MG tablet, Take 25 mg by mouth daily., Disp: , Rfl:    Review of Systems  Per HPI unless specifically indicated above     Objective:    There were no vitals taken for this visit.  Wt Readings from Last 3 Encounters:  10/08/18 188 lb 3.2 oz (85.4 kg)  04/19/18 189 lb 9.6 oz (86 kg)  12/14/17 182 lb (82.6 kg)    Physical Exam Pulmonary:     Effort: No respiratory distress.  Neurological:     Mental Status: He is alert and oriented to person, place, and time.  Psychiatric:  Attention and Perception: Attention normal.        Speech: Speech normal.        Behavior: Behavior is cooperative.         Assessment & Plan:   Encounter Diagnoses  Name Primary?  . Personal history of noncompliance with medical treatment, presenting hazards to health Yes  . Chronic obstructive pulmonary disease, unspecified COPD type ([place])   . Essential hypertension   . Tobacco use disorder   . Mood disorder [facility])      Discussed with patient that he really needs to get his blood work updated.  He is to go for fasting labs Monday this week.  We will have nurse call patient next week to see if she can help get him signed up for medassist.  Urged patient to call [facility] to get appointment to help with his mental health issues.   Looked up  results of coronavirus test on 06/17/2019 and discussed with patient.  It was negative.  Patient requests to have medication called into pharmacy to help with his congestion.  Will send prescription for mucinex  Encouraged smoking cessation.   We will follow up with patient in months in 1 month to review lab work and make sure that the things discussed today have been done.  Patient to contact office sooner as needed.

## 2019-08-15 ENCOUNTER — Ambulatory Visit: Payer: Self-pay | Admitting: Physician Assistant

## 2019-08-16 ENCOUNTER — Other Ambulatory Visit (HOSPITAL_COMMUNITY)
Admission: RE | Admit: 2019-08-16 | Discharge: 2019-08-16 | Disposition: A | Discharge status: Home / Self Care | Payer: Self-pay | Source: Ambulatory Visit | Attending: Physician Assistant | Admitting: Physician Assistant

## 2019-08-16 DIAGNOSIS — E785 Hyperlipidemia, unspecified: Secondary | ICD-10-CM

## 2019-08-16 DIAGNOSIS — Z125 Encounter for screening for malignant neoplasm of prostate: Secondary | ICD-10-CM

## 2019-08-16 DIAGNOSIS — I10 Essential (primary) hypertension: Secondary | ICD-10-CM

## 2019-08-16 LAB — COMPREHENSIVE METABOLIC PANEL
ALT: 11 U/L (ref 0–44)
AST: 15 U/L (ref 15–41)
Albumin: 3.8 g/dL (ref 3.5–5.0)
Alkaline Phosphatase: 76 U/L (ref 38–126)
Anion gap: 9 (ref 5–15)
BUN: 19 mg/dL (ref 6–20)
CO2: 27 mmol/L (ref 22–32)
Calcium: 8.7 mg/dL — ABNORMAL LOW (ref 8.9–10.3)
Chloride: 102 mmol/L (ref 98–111)
Creatinine, Ser: 1.01 mg/dL (ref 0.61–1.24)
GFR calc Af Amer: >60 mL/min (ref >60)
GFR calc non Af Amer: >60 mL/min (ref >60)
Glucose, Bld: 103 mg/dL — ABNORMAL HIGH (ref 70–99)
Potassium: 4.1 mmol/L (ref 3.5–5.1)
Sodium: 138 mmol/L (ref 135–145)
Total Bilirubin: 0.5 mg/dL (ref 0.3–1.2)
Total Protein: 6.9 g/dL (ref 6.5–8.1)

## 2019-08-16 LAB — LIPID PANEL
Cholesterol: 173 mg/dL (ref 0–200)
HDL: 40 mg/dL — ABNORMAL LOW (ref >40)
LDL Cholesterol: 117 mg/dL — ABNORMAL HIGH (ref 0–99)
Total CHOL/HDL Ratio: 4.3 RATIO
Triglycerides: 81 mg/dL (ref <150)
VLDL: 16 mg/dL (ref 0–40)

## 2019-08-16 LAB — PSA: Prostatic Specific Antigen: 0.28 ng/mL (ref 0.00–4.00)

## 2019-08-17 ENCOUNTER — Ambulatory Visit: Payer: Self-pay | Admitting: Physician Assistant

## 2019-08-17 ENCOUNTER — Encounter: Payer: Self-pay | Admitting: Physician Assistant

## 2019-08-17 DIAGNOSIS — F39 Unspecified mood [affective] disorder: Secondary | ICD-10-CM

## 2019-08-17 DIAGNOSIS — F17200 Nicotine dependence, unspecified, uncomplicated: Secondary | ICD-10-CM

## 2019-08-17 DIAGNOSIS — K219 Gastro-esophageal reflux disease without esophagitis: Secondary | ICD-10-CM

## 2019-08-17 DIAGNOSIS — J449 Chronic obstructive pulmonary disease, unspecified: Secondary | ICD-10-CM

## 2019-08-17 DIAGNOSIS — I10 Essential (primary) hypertension: Secondary | ICD-10-CM

## 2019-08-17 DIAGNOSIS — E785 Hyperlipidemia, unspecified: Secondary | ICD-10-CM

## 2019-08-17 NOTE — Progress Notes (Signed)
There were no vitals taken for this visit.   Subjective:    Patient ID: [name], male    DOB: 28-Apr-1966, 53 y.o.   MRN: [id number]  HPI: [name] is a 53 y.o. male presenting on 08/17/2019 for No chief complaint on file.   HPI    This is a telemedicine appointment due to coronavirus pandemic.  It is via telephone as doesn't have video enabled device  I connected with  EFFIE KOCHANSKI on 08/17/19 by a video enabled telemedicine application and verified that I am speaking with the correct person using two identifiers.   I discussed the limitations of evaluation and management by telemedicine. The patient expressed understanding and agreed to proceed.  Pt is sitting in a parked car somewhere getting ready to start work.  Provider is at office.  Pt says he has plenty of privacy and wants to proceed at this time (and declines offer to reschedule appointment)   Pt isn't seeing [facility] for mental health issues yet- he says he filled out a paper but never heard anything back on it.  He says his Mood is about the same. He says that is Depressed.  No SI or HI  He says his Breathing is okay  He checked bp at [place] recently ant it was normal.  He doesn't remember what the numbers were.      Relevant past medical, surgical, family and social history reviewed and updated as indicated. Interim medical history since our last visit reviewed. Allergies and medications reviewed and updated.   Current Outpatient Medications:  .  albuterol (PROVENTIL HFA;VENTOLIN HFA) 108 (90 Base) MCG/ACT inhaler, Inhale 2 puffs into the lungs every 6 (six) hours as needed for wheezing or shortness of breath., Disp: , Rfl:  .  cloNIDine (CATAPRES) 0.1 MG tablet, Take 3 mg by mouth 2 (two) times daily. 1 tab po once daily, Disp: , Rfl:  .  cyclobenzaprine (FLEXERIL) 10 MG tablet, Take 10 mg by mouth 3 (three) times daily as needed for muscle spasms., Disp: , Rfl:  .  mometasone-formoterol  (DULERA) 100-5 MCG/ACT AERO, Inhale 2 puffs into the lungs 2 (two) times daily., Disp: , Rfl:  .  busPIRone (BUSPAR) 10 MG tablet, Take 10 mg by mouth 3 (three) times daily., Disp: , Rfl:  .  DULoxetine (CYMBALTA) 20 MG capsule, Take 20 mg by mouth 2 (two) times daily., Disp: , Rfl:  .  guaiFENesin (MUCINEX) 600 MG 12 hr tablet, Take 1 tablet (600 mg total) by mouth 2 (two) times daily as needed. (Patient not taking: Reported on 08/17/2019), Disp: 30 tablet, Rfl: 3 .  omeprazole (PRILOSEC) 40 MG capsule, Take 1 capsule (40 mg total) by mouth daily before breakfast. (Patient not taking: Reported on 08/17/2019), Disp: 90 capsule, Rfl: 3 .  topiramate (TOPAMAX) 25 MG tablet, Take 25 mg by mouth daily., Disp: , Rfl:    Review of Systems  Per HPI unless specifically indicated above     Objective:    There were no vitals taken for this visit.  Wt Readings from Last 3 Encounters:  10/08/18 188 lb 3.2 oz (85.4 kg)  04/19/18 189 lb 9.6 oz (86 kg)  12/14/17 182 lb (82.6 kg)    Physical Exam Pulmonary:     Effort: No respiratory distress.  Neurological:     Mental Status: He is alert and oriented to person, place, and time.  Psychiatric:        Attention and Perception:  Attention normal.        Speech: Speech normal.        Behavior: Behavior is cooperative.     Results for orders placed or performed during the hospital encounter of 08/16/19  Comprehensive metabolic panel  Result Value Ref Range   Sodium 138 135 - 145 mmol/L   Potassium 4.1 3.5 - 5.1 mmol/L   Chloride 102 98 - 111 mmol/L   CO2 27 22 - 32 mmol/L   Glucose, Bld 103 (H) 70 - 99 mg/dL   BUN 19 6 - 20 mg/dL   Creatinine, Ser 1.01 0.61 - 1.24 mg/dL   Calcium 8.7 (L) 8.9 - 10.3 mg/dL   Total Protein 6.9 6.5 - 8.1 g/dL   Albumin 3.8 3.5 - 5.0 g/dL   AST 15 15 - 41 U/L   ALT 11 0 - 44 U/L   Alkaline Phosphatase 76 38 - 126 U/L   Total Bilirubin 0.5 0.3 - 1.2 mg/dL   GFR calc non Af Amer >60 >60 mL/min   GFR calc Af Amer  >60 >60 mL/min   Anion gap 9 5 - 15  Lipid panel  Result Value Ref Range   Cholesterol 173 0 - 200 mg/dL   Triglycerides 81 <150 mg/dL   HDL 40 (L) >40 mg/dL   Total CHOL/HDL Ratio 4.3 RATIO   VLDL 16 0 - 40 mg/dL   LDL Cholesterol 117 (H) 0 - 99 mg/dL  PSA  Result Value Ref Range   Prostatic Specific Antigen 0.28 0.00 - 4.00 ng/mL      Assessment & Plan:   Encounter Diagnoses  Name Primary?  . Essential hypertension Yes  . Chronic obstructive pulmonary disease, unspecified COPD type ([place])   . Tobacco use disorder   . Mood disorder ([place])   . Hyperlipidemia, unspecified hyperlipidemia type   . Gastroesophageal reflux disease, unspecified whether esophagitis present     -reviewed labs with pt -pt to continue current meds -encouraged smoking cessation -counseled pt on lowfat diet to help lipids.  Also discussed reasons why we want the cholesterol to not be too high.  Will check this again before next appointment and consider statin if not much improved. -encouraged pt to call [facility] back about an appointment -pt to follow up with GI per their recomendation -encouraged pt to wear a mask when not at home to reduce risk of covid 19 transmission -pt to follow up 3 months with labs prior to that appointment.  He is to contact office sooner prn

## 2019-08-28 NOTE — Progress Notes (Deleted)
Referring Provider: Soyla Dryer, PA-C Primary Care Physician:  Soyla Dryer, PA-C Primary GI Physician: Dr. Gala Romney  No chief complaint on file.   HPI:   [name] is a 54 y.o. male presenting today for follow-up.  History significant for chronic GERD, dysphagia s/p dilation in 2019, anorectal burning/itching with suspected anorectal fissure treated with compounded Xylocaine and nitroglycerin in January 2020, known hemorrhoids, chronic intermittent diarrhea since colon surgery in [id number] for complicated diverticulitis, chronic intermittent abdominal pain with extensive evaluation in the past possibly related to scar tissue/previous surgeries.  Last seen via virtual visit in April 2020.  Anal fissure improved only occasionally using medication.  GERD well-controlled on omeprazole daily.  No significant trouble with swallowing.  Intermittent diarrhea at baseline.  Stable chronic intermittent abdominal pain.   EGD/TCS in April 2019:  TCS: Two 4-6 mm polyps in the cecum that appeared to be growing out of the ileocecal bowel.  Single polyp in the proximal rectum.  Grade 1 internal hemorrhoids.  Colon and rectal polyps were tubular adenomas.  Repeat colonoscopy in 5 years. EGD: Erosive reflux esophagitis with mild stricturing s/p dilation, erosive gastropathy biopsied, duodenal erosions.  Pathology with reactive gastropathy, no H. pylori, metaplasia, or malignancy.  Today:  Past Medical History:  Diagnosis Date  . Acute pancreatitis   . Anxiety   . Arthritis   . Cancer ([place])    Adenoma in colon  . Cannabis abuse   . COPD (chronic obstructive pulmonary disease) ([place])   . Depression   . Diverticulitis of colon with perforation 08/2010  . Dyspnea   . GERD (gastroesophageal reflux disease)   . History of hiatal hernia   . Hypertension    taken off lisinopril by renal md 08/2011  . Renal insufficiency    follwed by Dr. Hinda Lenis  . Sleep apnea     Past Surgical History:    Procedure Laterality Date  . APPENDECTOMY    . BIOPSY  12/14/2017   Procedure: BIOPSY;  Surgeon: Daneil Dolin, MD;  [facility];  Service: Endoscopy;;  gastric   . COLON SURGERY  05/2011   [facility]. exp laparotomy with colostomy takedown, lysis of adhesions (3hours), loop ileostomy. complicated by abscess formation requiring percutaneous drainage  . colonoscopy via stoma with endoscopy of [name]  04/2011   [facility]. two sessile polyps in trv colon. complete resection but only partial retrieval. congested mucosa in area 3cm proximal to stoma, erythematous mucosa in Hartmann pouch. repeat tcs 8/2017path-adenomatous polyp  . COLONOSCOPY WITH PROPOFOL N/A 12/14/2017   Dr. Gala Romney: 2 4 to 6 mm polyps in the cecum, tubular adenomas, single rectal polyp tubular adenoma, nonbleeding internal hemorrhoids.  Appears to be a surgical clip at the appendiceal orifice.  Next colonoscopy in 5 years  . ESOPHAGOGASTRODUODENOSCOPY  03/05/2012   erosive reflux esophagitis, bulbar erosions, gastric erosions/petichae, bx showed gastritis, no H.Pylori. Procedure: ESOPHAGOGASTRODUODENOSCOPY (EGD);  Surgeon: Daneil Dolin, MD;  [facility];  Service: Endoscopy;  Laterality: N/A;  11:30  . ESOPHAGOGASTRODUODENOSCOPY (EGD) WITH PROPOFOL N/A 12/14/2017   Dr. Gala Romney: Erosive reflux esophagitis with questionable mild stricturing/non-obstruction at this level status post dilation.  multiple gastric erosions in the stomach biopsy showed reactive gastropathy but no H. pylori.  Marland Kitchen HERNIA REPAIR    . KNEE SURGERY Right 2006  . MALONEY DILATION N/A 12/14/2017   Procedure: Venia Minks DILATION;  Surgeon: Daneil Dolin, MD;  [facility];  Service: Endoscopy;  Laterality: N/A;  .  MULTIPLE EXTRACTIONS WITH ALVEOLOPLASTY  02/23/2012   Procedure: MULTIPLE EXTRACION WITH ALVEOLOPLASTY;  Surgeon: Gae Bon, DDS;  Location: [place];  Service: Oral Surgery;  Laterality: Bilateral;  Removal of left mandibular  torus, multiple extractions with alveoloplasty  . POLYPECTOMY  12/14/2017   Procedure: POLYPECTOMY;  Surgeon: Daneil Dolin, MD;  [facility];  Service: Endoscopy;;  . reversal of colostomy  10/2011   per patient, did not have report  . right knee surgery    . sigmoid colectomy with end colostomy, Hartmann's pouch  08/2010   [facility]. complicated diverticulitis  . UMBILICAL HERNIA REPAIR      Current Outpatient Medications  Medication Sig Dispense Refill  . albuterol (PROVENTIL HFA;VENTOLIN HFA) 108 (90 Base) MCG/ACT inhaler Inhale 2 puffs into the lungs every 6 (six) hours as needed for wheezing or shortness of breath.    . busPIRone (BUSPAR) 10 MG tablet Take 10 mg by mouth 3 (three) times daily.    . cloNIDine (CATAPRES) 0.1 MG tablet Take 3 mg by mouth 2 (two) times daily. 1 tab po once daily    . cyclobenzaprine (FLEXERIL) 10 MG tablet Take 10 mg by mouth 3 (three) times daily as needed for muscle spasms.    . DULoxetine (CYMBALTA) 20 MG capsule Take 20 mg by mouth 2 (two) times daily.    Marland Kitchen guaiFENesin (MUCINEX) 600 MG 12 hr tablet Take 1 tablet (600 mg total) by mouth 2 (two) times daily as needed. (Patient not taking: Reported on 08/17/2019) 30 tablet 3  . mometasone-formoterol (DULERA) 100-5 MCG/ACT AERO Inhale 2 puffs into the lungs 2 (two) times daily.    Marland Kitchen omeprazole (PRILOSEC) 40 MG capsule Take 1 capsule (40 mg total) by mouth daily before breakfast. (Patient not taking: Reported on 08/17/2019) 90 capsule 3  . topiramate (TOPAMAX) 25 MG tablet Take 25 mg by mouth daily.     No current facility-administered medications for this visit.    Allergies as of 08/29/2019 - Review Complete 07/19/2019  Allergen Reaction Noted  . Codeine Itching and Nausea And Vomiting   . Latex Other (See Comments) 03/09/2012  . Lisinopril  05/27/2017  . Morphine Hives   . Adhesive [tape] Rash 03/08/2012    Family History  Problem Relation Age of Onset  . Stomach cancer Father   . Lung  cancer Father   . Throat cancer Father   . Cancer Father        lung and throat cancer  . Hypertension Father   . Liver disease Cousin        cirrhosis, etoh  . Throat cancer Paternal Uncle   . Lung cancer Paternal Uncle   . Colon cancer Cousin 31  . Heart disease Mother   . Hypertension Mother   . Colon cancer Maternal Uncle   . Throat cancer Paternal Uncle   . Lung cancer Paternal Uncle   . Throat cancer Paternal Uncle   . Lung cancer Paternal Uncle     Social History   Socioeconomic History  . Marital status: Single    Spouse name: Not on file  . Number of children: 0  . Years of education: Not on file  . Highest education level: Not on file  Occupational History  . Not on file  Tobacco Use  . Smoking status: Current Every Day Smoker    Packs/day: 1.00    Years: 37.00    Pack years: 37.00    Types: Cigarettes  . Smokeless tobacco:  Former Systems developer  Substance and Sexual Activity  . Alcohol use: No    Comment: former user quit in 2014. previous weekend drinker  . Drug use: Not Currently    Types: Marijuana    Comment: occ  . Sexual activity: Not on file  Other Topics Concern  . Not on file  Social History Narrative  . Not on file   Social Determinants of Health   Financial Resource Strain:   . Difficulty of Paying Living Expenses: Not on file  Food Insecurity:   . Worried About Charity fundraiser in the Last Year: Not on file  . Ran Out of Food in the Last Year: Not on file  Transportation Needs:   . Lack of Transportation (Medical): Not on file  . Lack of Transportation (Non-Medical): Not on file  Physical Activity:   . Days of Exercise per Week: Not on file  . Minutes of Exercise per Session: Not on file  Stress:   . Feeling of Stress : Not on file  Social Connections:   . Frequency of Communication with Friends and Family: Not on file  . Frequency of Social Gatherings with Friends and Family: Not on file  . Attends Religious Services: Not on file  .  Active Member of Clubs or Organizations: Not on file  . Attends Archivist Meetings: Not on file  . Marital Status: Not on file    Review of Systems: Gen: Denies fever, chills, anorexia. Denies fatigue, weakness, weight loss.  CV: Denies chest pain, palpitations, syncope, peripheral edema, and claudication. Resp: Denies dyspnea at rest, cough, wheezing, coughing up blood, and pleurisy. GI: Denies vomiting blood, jaundice, and fecal incontinence.   Denies dysphagia or odynophagia. Derm: Denies rash, itching, dry skin Psych: Denies depression, anxiety, memory loss, confusion. No homicidal or suicidal ideation.  Heme: Denies bruising, bleeding, and enlarged lymph nodes.  Physical Exam: There were no vitals taken for this visit. General:   Alert and oriented. No distress noted. Pleasant and cooperative.  Head:  Normocephalic and atraumatic. Eyes:  Conjuctiva clear without scleral icterus. Mouth:  Oral mucosa pink and moist. Good dentition. No lesions. Heart:  S1, S2 present without murmurs appreciated. Lungs:  Clear to auscultation bilaterally. No wheezes, rales, or rhonchi. No distress.  Abdomen:  +BS, soft, non-tender and non-distended. No rebound or guarding. No HSM or masses noted. Msk:  Symmetrical without gross deformities. Normal posture. Extremities:  Without edema. Neurologic:  Alert and  oriented x4 Psych:  Alert and cooperative. Normal mood and affect.

## 2019-08-29 ENCOUNTER — Ambulatory Visit: Payer: Self-pay | Admitting: Gastroenterology

## 2019-08-29 ENCOUNTER — Telehealth: Payer: Self-pay | Admitting: Internal Medicine

## 2019-08-29 NOTE — Telephone Encounter (Signed)
ATC, not able to leave VM

## 2019-08-29 NOTE — Telephone Encounter (Signed)
Pt was scheduled OV for today, but he was 20 minutes late and had to reschedule. He is aware of his OV on 12/31 at 0800. He was asking if he could get samples of Dexilant. Please advise. [phone]

## 2019-09-08 ENCOUNTER — Other Ambulatory Visit: Payer: Self-pay

## 2019-09-08 ENCOUNTER — Ambulatory Visit (INDEPENDENT_AMBULATORY_CARE_PROVIDER_SITE_OTHER): Payer: Self-pay | Admitting: Nurse Practitioner

## 2019-09-08 ENCOUNTER — Encounter: Payer: Self-pay | Admitting: Nurse Practitioner

## 2019-09-08 VITALS — BP 184/116 | HR 77 | Temp 96.8°F | Ht 67.0 in | Wt 179.6 lb

## 2019-09-08 DIAGNOSIS — R1319 Other dysphagia: Secondary | ICD-10-CM

## 2019-09-08 DIAGNOSIS — K6289 Other specified diseases of anus and rectum: Secondary | ICD-10-CM

## 2019-09-08 DIAGNOSIS — K219 Gastro-esophageal reflux disease without esophagitis: Secondary | ICD-10-CM

## 2019-09-08 DIAGNOSIS — R1310 Dysphagia, unspecified: Secondary | ICD-10-CM

## 2019-09-08 DIAGNOSIS — K921 Melena: Secondary | ICD-10-CM

## 2019-09-08 MED ORDER — PANTOPRAZOLE SODIUM 40 MG PO TBEC: 40.0000 mg | DELAYED_RELEASE_TABLET | Freq: Every day | ORAL | 5 refills | Status: DC

## 2019-09-08 NOTE — Progress Notes (Signed)
Referring Provider: Soyla Dryer, PA-C Primary Care Physician:  Soyla Dryer, PA-C Primary GI:  Dr. Gala Romney  Chief Complaint  Patient presents with  . Dysphagia    getting worse  . Gastroesophageal Reflux    out of Dexilant for months  . Rectal Bleeding    about every time wipes; passes clot about once a month  . rectal burning    HPI:   [name] is a 53 y.o. male who presents for follow-up on GERD. The last visit was a virtual visit due to COVID-19/Coronavirus pandemic.  Noted history of chronic GERD at this time doing well on omeprazole 40 mg daily.  Previous complaint of anorectal burning and itching for a month and low-grade hematochezia in the setting of known hemorrhoids.  Colonoscopy up-to-date.  DRE found likely anal rectal fissure and was provided compounded lidocaine and nitroglycerin.  Updated EGD and colonoscopy in April 2019 with esophagitis, mild stricturing status post esophageal dilation, multiple gastric erosions.  Biopsy showed reactive gastropathy, no H. pylori.  2 sessile polyps in the cecum measuring 4 to 6 mm which were resected and retrieved and appear to be growing out of the ileocecal valve.  Single polyp in the rectum.  Nonbleeding internal hemorrhoids.  Polyps were tubular adenoma and recommended 5-year repeat (2024).  At his last visit his anal fissure had improved, occasionally uses topical medication.  Omeprazole daily controlling heartburn.  No dysphagia symptoms.  Occasional diarrhea which is chronic since surgery in 20 07/2011.  Chronic intermittent abdominal pain with extensive evaluation possibly related to scar tissue and previous surgeries.  No other overt GI complaints.  Recommended continue omeprazole daily, call for any worsening symptoms, follow-up in 6 months.  Today he states he's doing ok overall. He lost [employer] in August and has been out of Dexilant. He has a new application to renew but he hasn't filled it out yet. Applied for  disability but was turned down. Since then he has had worsening GERD and dysphagia. Has GERD symptoms every day. He has been taking TUMS, which helps momentarily. Has solid food dysphagia symptoms with every meal, typically passes after a few minutes but has occasional regurgitation. Also admits odynophagia with dysphagia. Still with chronic abdominal pain, unchanged from baseline. Denies N/V. Has been having hematochezia with clot passing about once a week. Has known hemorrhoids. Cannot afford the topical therapy, bought something over the counter to see if that helps. Also notes regular rectal pain, itching, irritation. Has "pitch black, tarry stools" not very often, about once or twice a month. Denies fever, chills. Has lost about 10-20 lbs subjectively over the past year. Denies URI or flu-like symptoms. Denies loss of sense of taste or smell. Denies chest pain, dyspnea, dizziness, lightheadedness, syncope, near syncope. Denies any other upper or lower GI symptoms.   Past Medical History:  Diagnosis Date  . Acute pancreatitis   . Anxiety   . Arthritis   . Cancer ([place])    Adenoma in colon  . Cannabis abuse   . COPD (chronic obstructive pulmonary disease) ([place])   . Depression   . Diverticulitis of colon with perforation 08/2010  . Dyspnea   . GERD (gastroesophageal reflux disease)   . History of hiatal hernia   . Hypertension    taken off lisinopril by renal md 08/2011  . Renal insufficiency    follwed by Dr. Hinda Lenis  . Sleep apnea     Past Surgical History:  Procedure Laterality Date  .  APPENDECTOMY    . BIOPSY  12/14/2017   Procedure: BIOPSY;  Surgeon: Daneil Dolin, MD;  [facility];  Service: Endoscopy;;  gastric   . COLON SURGERY  05/2011   [facility]. exp laparotomy with colostomy takedown, lysis of adhesions (3hours), loop ileostomy. complicated by abscess formation requiring percutaneous drainage  . colonoscopy via stoma with endoscopy of [name]  04/2011    [facility]. two sessile polyps in trv colon. complete resection but only partial retrieval. congested mucosa in area 3cm proximal to stoma, erythematous mucosa in Hartmann pouch. repeat tcs 8/2017path-adenomatous polyp  . COLONOSCOPY WITH PROPOFOL N/A 12/14/2017   Dr. Gala Romney: 2 4 to 6 mm polyps in the cecum, tubular adenomas, single rectal polyp tubular adenoma, nonbleeding internal hemorrhoids.  Appears to be a surgical clip at the appendiceal orifice.  Next colonoscopy in 5 years  . ESOPHAGOGASTRODUODENOSCOPY  03/05/2012   erosive reflux esophagitis, bulbar erosions, gastric erosions/petichae, bx showed gastritis, no H.Pylori. Procedure: ESOPHAGOGASTRODUODENOSCOPY (EGD);  Surgeon: Daneil Dolin, MD;  [facility];  Service: Endoscopy;  Laterality: N/A;  11:30  . ESOPHAGOGASTRODUODENOSCOPY (EGD) WITH PROPOFOL N/A 12/14/2017   Dr. Gala Romney: Erosive reflux esophagitis with questionable mild stricturing/non-obstruction at this level status post dilation.  multiple gastric erosions in the stomach biopsy showed reactive gastropathy but no H. pylori.  Marland Kitchen HERNIA REPAIR    . KNEE SURGERY Right 2006  . MALONEY DILATION N/A 12/14/2017   Procedure: Venia Minks DILATION;  Surgeon: Daneil Dolin, MD;  [facility];  Service: Endoscopy;  Laterality: N/A;  . MULTIPLE EXTRACTIONS WITH ALVEOLOPLASTY  02/23/2012   Procedure: MULTIPLE EXTRACION WITH ALVEOLOPLASTY;  Surgeon: Gae Bon, DDS;  Location: [place];  Service: Oral Surgery;  Laterality: Bilateral;  Removal of left mandibular torus, multiple extractions with alveoloplasty  . POLYPECTOMY  12/14/2017   Procedure: POLYPECTOMY;  Surgeon: Daneil Dolin, MD;  [facility];  Service: Endoscopy;;  . reversal of colostomy  10/2011   per patient, did not have report  . right knee surgery    . sigmoid colectomy with end colostomy, Hartmann's pouch  08/2010   [facility]. complicated diverticulitis  . UMBILICAL HERNIA REPAIR      Current Outpatient  Medications  Medication Sig Dispense Refill  . albuterol (PROVENTIL HFA;VENTOLIN HFA) 108 (90 Base) MCG/ACT inhaler Inhale 2 puffs into the lungs every 6 (six) hours as needed for wheezing or shortness of breath.    . busPIRone (BUSPAR) 10 MG tablet Take 10 mg by mouth 3 (three) times daily.    . cloNIDine (CATAPRES) 0.1 MG tablet Take 0.3 mg by mouth 2 (two) times daily.     . cyclobenzaprine (FLEXERIL) 10 MG tablet Take 10 mg by mouth 3 (three) times daily as needed for muscle spasms.    . mometasone-formoterol (DULERA) 100-5 MCG/ACT AERO Inhale 2 puffs into the lungs 2 (two) times daily.     No current facility-administered medications for this visit.    Allergies as of 09/08/2019 - Review Complete 09/08/2019  Allergen Reaction Noted  . Codeine Itching and Nausea And Vomiting   . Latex Other (See Comments) 03/09/2012  . Lisinopril  05/27/2017  . Morphine Hives   . Adhesive [tape] Rash 03/08/2012    Family History  Problem Relation Age of Onset  . Stomach cancer Father   . Lung cancer Father   . Throat cancer Father   . Cancer Father        lung and throat  cancer  . Hypertension Father   . Liver disease Cousin        cirrhosis, etoh  . Throat cancer Paternal Uncle   . Lung cancer Paternal Uncle   . Colon cancer Cousin 76  . Heart disease Mother   . Hypertension Mother   . Colon cancer Maternal Uncle   . Throat cancer Paternal Uncle   . Lung cancer Paternal Uncle   . Throat cancer Paternal Uncle   . Lung cancer Paternal Uncle     Social History   Socioeconomic History  . Marital status: Single    Spouse name: Not on file  . Number of children: 0  . Years of education: Not on file  . Highest education level: Not on file  Occupational History  . Not on file  Tobacco Use  . Smoking status: Current Every Day Smoker    Packs/day: 1.00    Years: 37.00    Pack years: 37.00    Types: Cigarettes  . Smokeless tobacco: Former Network engineer and Sexual Activity  .  Alcohol use: No    Comment: former user quit in 2014. previous weekend drinker  . Drug use: Yes    Frequency: 7.0 times per week    Types: Marijuana    Comment: once daily  . Sexual activity: Not on file  Other Topics Concern  . Not on file  Social History Narrative  . Not on file   Social Determinants of Health   Financial Resource Strain:   . Difficulty of Paying Living Expenses: Not on file  Food Insecurity:   . Worried About Charity fundraiser in the Last Year: Not on file  . Ran Out of Food in the Last Year: Not on file  Transportation Needs:   . Lack of Transportation (Medical): Not on file  . Lack of Transportation (Non-Medical): Not on file  Physical Activity:   . Days of Exercise per Week: Not on file  . Minutes of Exercise per Session: Not on file  Stress:   . Feeling of Stress : Not on file  Social Connections:   . Frequency of Communication with Friends and Family: Not on file  . Frequency of Social Gatherings with Friends and Family: Not on file  . Attends Religious Services: Not on file  . Active Member of Clubs or Organizations: Not on file  . Attends Archivist Meetings: Not on file  . Marital Status: Not on file    Review of Systems: General: Negative for anorexia, weight loss, fever, chills, fatigue, weakness. ENT: Negative for hoarseness, difficulty swallowing. CV: Negative for chest pain, angina, palpitations, peripheral edema.  Respiratory: Negative for dyspnea at rest, cough, sputum, wheezing.  GI: See history of present illness. Endo: Negative for unusual weight change.  Heme: Negative for bruising or bleeding. Allergy: Negative for rash or hives.   Physical Exam: BP (!) 184/116 Comment: left arm manually  Pulse 77   Temp (!) 96.8 F (36 C) (Temporal)   Ht 5\' 7"  (1.702 m)   Wt 179 lb 9.6 oz (81.5 kg)   BMI 28.13 kg/m  General:   Alert and oriented. Pleasant and cooperative. Well-nourished and well-developed.  Eyes:  Without  icterus, sclera clear and conjunctiva pink.  Ears:  Normal auditory acuity. Cardiovascular:  S1, S2 present without murmurs appreciated. Extremities without clubbing or edema. Respiratory:  Clear to auscultation bilaterally. No wheezes, rales, or rhonchi. No distress.  Gastrointestinal:  +BS, soft, and non-distended. Mild  to moderate generalized TTP, no TTP epigastric LUQ areas specifically. No HSM noted. No guarding or rebound. No masses appreciated.  Rectal:  Deferred  Musculoskalatal:  Symmetrical without gross deformities. Neurologic:  Alert and oriented x4;  grossly normal neurologically. Psych:  Alert and cooperative. Normal mood and affect. Heme/Lymph/Immune: No excessive bruising noted.    09/08/2019 8:36 AM   Disclaimer: This note was dictated with voice recognition software. Similar sounding words can inadvertently be transcribed and may not be corrected upon review.

## 2019-09-08 NOTE — Patient Instructions (Signed)
Your health issues we discussed today were:   Rectal bleeding with known hemorrhoids: 1. Try the over-the-counter rectal cream you obtained from [employer] 2. Let us know if this does not help and we can try to find a cheaper hemorrhoid cream for you 3. If you have persistent hemorrhoid symptoms and bleeding you may be a candidate for hemorrhoid banding in the office. 4. We can further discuss this at your next office visit 5. Because of you have any worsening or severe symptoms  GERD (reflux/heartburn) with swallowing difficulties: 1. We were able to obtain for Protonix 40 mg daily.  This should cost him just under $9 a month 2. Start taking your Protonix every day 3. Call us in 2 weeks and let us know if it is helping.  Also let us know if you are still having swallowing problems 4. In the meantime, avoid trigger foods and to make her reflux worse 5. Also avoid foods that tend to get stuck, culture meats into small pieces, chew adequately.  Further recommendations below 6. Call us if you have any worsening or severe symptoms  Overall I recommend:  1. Continue your other current medications 2. Return for follow-up in 6 to 8 weeks 3. Call us if you have any questions or concerns 4. Call your primary care as soon as you leave our office to discuss your high blood pressure. 5. If you develop dizziness, headache, weakness, passing out, chest pain, shortness of breath, other concerning symptoms then proceed directly to the emergency room   Because of recent events of COVID-19 ("Coronavirus"), follow CDC recommendations:  1. Wash your hand frequently 2. Avoid touching your face 3. Stay away from people who are sick 4. If you have symptoms such as fever, cough, shortness of breath then call your healthcare provider for further guidance 5. If you are sick, STAY AT HOME unless otherwise directed by your healthcare provider. 6. Follow directions from state and national officials regarding staying  safe   At [facility] we value your feedback. You may receive a survey about your visit today. Please share your experience as we strive to create trusting relationships with our patients to provide genuine, compassionate, quality care.  We appreciate your understanding and patience as we review any laboratory studies, imaging, and other diagnostic tests that are ordered as we care for you. Our office policy is 5 business days for review of these results, and any emergent or urgent results are addressed in a timely manner for your best interest. If you do not hear from our office in 1 week, please contact us.   We also encourage the use of MyChart, which contains your medical information for your review as well. If you are not enrolled in this feature, an access code is on this after visit summary for your convenience. Thank you for allowing [place] to be involved in your care.  It was great to see you today!  I hope you have a Happy New Year!!

## 2019-09-08 NOTE — Progress Notes (Signed)
CC'ED TO PCP 

## 2019-09-08 NOTE — Assessment & Plan Note (Signed)
GERD symptoms have worsened significantly since being off PPI.  He came off his PPI because he lost his prescription assistance.  Has also had subsequent worsening of dysphagia and odynophagia.  At this point we will try to get him back on a PPI through Rx discount card either Prilosec or Protonix daily.  I asked him to call us in 2 weeks and let us know how he is doing.  Follow-up in 6 to 8 weeks.

## 2019-09-08 NOTE — Assessment & Plan Note (Signed)
Noted weekly hematochezia with known internal hemorrhoids and recent colonoscopy about 1 year ago.  He is not due for repeat colonoscopy for another 4 years.  His rectal bleeding is likely coming from known internal hemorrhoids that are symptomatic for him at this time.  He may be a candidate for hemorrhoid banding, and I had a short order to initiate this he does not have improvement with topical therapy.  Follow-up in 6 to 8 weeks.

## 2019-09-08 NOTE — Assessment & Plan Note (Signed)
He has run out of his compounded rectal cream from [employer].  He cannot afford to obtain this medication.  He is obtained an over-the-counter rectal cream with lidocaine that he is going to attempt.  I have asked him to call and let us know if this does not help.  If not, we can try other cheaper options other than compounded cream such as Anusol.  He has known internal hemorrhoids.  Rectal itching, irritation, pain is quite frequent and likely due to his hemorrhoids.  Follow-up in 6 to 8 weeks.  He may eventually be a candidate for hemorrhoid banding if he does not have improvement with topical therapy.

## 2019-09-08 NOTE — Assessment & Plan Note (Signed)
He has had an increase in dysphagia frequency with his worsening GERD.  Further current management as per above.  I will ask him to call us in 2 weeks after restarting PPI to see if he has improvement in his dysphagia and odynophagia.  If not, we can plan a barium pill esophagram or repeat EGD.  His last EGD was in April 2019 which had mild stricturing status post dilation.  Return for follow-up in 6 to 8 weeks for further treatment decisions.

## 2019-09-09 DIAGNOSIS — I639 Cerebral infarction, unspecified: Secondary | ICD-10-CM

## 2019-09-09 HISTORY — DX: Cerebral infarction, unspecified: I63.9

## 2019-10-08 ENCOUNTER — Emergency Department (HOSPITAL_COMMUNITY): Payer: Self-pay

## 2019-10-08 ENCOUNTER — Encounter (HOSPITAL_COMMUNITY): Payer: Self-pay

## 2019-10-08 ENCOUNTER — Other Ambulatory Visit: Payer: Self-pay

## 2019-10-08 ENCOUNTER — Emergency Department (HOSPITAL_COMMUNITY)
Admission: EM | Admit: 2019-10-08 | Discharge: 2019-10-08 | Disposition: A | Discharge status: Home / Self Care | Payer: Self-pay | Attending: Emergency Medicine | Admitting: Emergency Medicine

## 2019-10-08 DIAGNOSIS — F1210 Cannabis abuse, uncomplicated: Secondary | ICD-10-CM | POA: Insufficient documentation

## 2019-10-08 DIAGNOSIS — F17210 Nicotine dependence, cigarettes, uncomplicated: Secondary | ICD-10-CM | POA: Insufficient documentation

## 2019-10-08 DIAGNOSIS — I10 Essential (primary) hypertension: Secondary | ICD-10-CM | POA: Insufficient documentation

## 2019-10-08 DIAGNOSIS — K209 Esophagitis, unspecified without bleeding: Secondary | ICD-10-CM | POA: Insufficient documentation

## 2019-10-08 DIAGNOSIS — R1030 Lower abdominal pain, unspecified: Secondary | ICD-10-CM | POA: Insufficient documentation

## 2019-10-08 DIAGNOSIS — J449 Chronic obstructive pulmonary disease, unspecified: Secondary | ICD-10-CM | POA: Insufficient documentation

## 2019-10-08 DIAGNOSIS — Z91040 Latex allergy status: Secondary | ICD-10-CM | POA: Insufficient documentation

## 2019-10-08 LAB — CBC WITH DIFFERENTIAL/PLATELET
Abs Immature Granulocytes: 0.03 10*3/uL (ref 0.00–0.07)
Basophils Absolute: 0 10*3/uL (ref 0.0–0.1)
Basophils Relative: 0 %
Eosinophils Absolute: 0 10*3/uL (ref 0.0–0.5)
Eosinophils Relative: 0 %
HCT: 51.5 % (ref 39.0–52.0)
Hemoglobin: 17.3 g/dL — ABNORMAL HIGH (ref 13.0–17.0)
Immature Granulocytes: 0 %
Lymphocytes Relative: 19 %
Lymphs Abs: 2 10*3/uL (ref 0.7–4.0)
MCH: 30.6 pg (ref 26.0–34.0)
MCHC: 33.6 g/dL (ref 30.0–36.0)
MCV: 91 fL (ref 80.0–100.0)
Monocytes Absolute: 0.6 10*3/uL (ref 0.1–1.0)
Monocytes Relative: 6 %
Neutro Abs: 7.8 10*3/uL — ABNORMAL HIGH (ref 1.7–7.7)
Neutrophils Relative %: 75 %
Platelets: 215 10*3/uL (ref 150–400)
RBC: 5.66 MIL/uL (ref 4.22–5.81)
RDW: 12.7 % (ref 11.5–15.5)
WBC: 10.5 10*3/uL (ref 4.0–10.5)
nRBC: 0 % (ref 0.0–0.2)

## 2019-10-08 LAB — COMPREHENSIVE METABOLIC PANEL
ALT: 13 U/L (ref 0–44)
AST: 14 U/L — ABNORMAL LOW (ref 15–41)
Albumin: 4.2 g/dL (ref 3.5–5.0)
Alkaline Phosphatase: 71 U/L (ref 38–126)
Anion gap: 10 (ref 5–15)
BUN: 16 mg/dL (ref 6–20)
CO2: 27 mmol/L (ref 22–32)
Calcium: 8.9 mg/dL (ref 8.9–10.3)
Chloride: 99 mmol/L (ref 98–111)
Creatinine, Ser: 1.06 mg/dL (ref 0.61–1.24)
GFR calc Af Amer: >60 mL/min (ref >60)
GFR calc non Af Amer: >60 mL/min (ref >60)
Glucose, Bld: 101 mg/dL — ABNORMAL HIGH (ref 70–99)
Potassium: 3.7 mmol/L (ref 3.5–5.1)
Sodium: 136 mmol/L (ref 135–145)
Total Bilirubin: 0.7 mg/dL (ref 0.3–1.2)
Total Protein: 7.8 g/dL (ref 6.5–8.1)

## 2019-10-08 LAB — URINALYSIS, ROUTINE W REFLEX MICROSCOPIC
Bilirubin Urine: NEGATIVE
Glucose, UA: NEGATIVE mg/dL
Hgb urine dipstick: NEGATIVE
Ketones, ur: NEGATIVE mg/dL
Leukocytes,Ua: NEGATIVE
Nitrite: NEGATIVE
Protein, ur: NEGATIVE mg/dL
Specific Gravity, Urine: 1.01 (ref 1.005–1.030)
pH: 8 (ref 5.0–8.0)

## 2019-10-08 LAB — LIPASE, BLOOD: Lipase: 15 U/L (ref 11–51)

## 2019-10-08 LAB — TROPONIN I (HIGH SENSITIVITY)
Troponin I (High Sensitivity): 3 ng/L (ref <18)
Troponin I (High Sensitivity): 3 ng/L (ref <18)

## 2019-10-08 MED ORDER — ONDANSETRON 4 MG PO TBDP: 4.0000 mg | ORAL_TABLET | Freq: Three times a day (TID) | ORAL | 0 refills | Status: DC | PRN

## 2019-10-08 MED ORDER — IOHEXOL 300 MG/ML  SOLN
100.0000 mL | Freq: Once | INTRAMUSCULAR | Status: AC | PRN
  Administered 2019-10-08: 100 mL via INTRAVENOUS

## 2019-10-08 MED ORDER — HYDROCODONE-ACETAMINOPHEN 5-325 MG PO TABS: 2.0000 | ORAL_TABLET | ORAL | 0 refills | Status: DC | PRN

## 2019-10-08 MED ORDER — CLONIDINE HCL 0.1 MG PO TABS
0.1000 mg | ORAL_TABLET | Freq: Once | ORAL | Status: AC
  Administered 2019-10-08: 16:00:00 0.1 mg via ORAL
  Filled 2019-10-08: qty 1

## 2019-10-08 MED ORDER — FENTANYL CITRATE (PF) 100 MCG/2ML IJ SOLN
50.0000 ug | Freq: Once | INTRAMUSCULAR | Status: AC
  Administered 2019-10-08: 50 ug via INTRAVENOUS
  Filled 2019-10-08: qty 2

## 2019-10-08 MED ORDER — PANTOPRAZOLE SODIUM 40 MG IV SOLR
40.0000 mg | Freq: Once | INTRAVENOUS | Status: AC
  Administered 2019-10-08: 40 mg via INTRAVENOUS
  Filled 2019-10-08: qty 40

## 2019-10-08 MED ORDER — CLONIDINE HCL 0.1 MG PO TABS
0.1000 mg | ORAL_TABLET | Freq: Once | ORAL | Status: AC
  Administered 2019-10-08: 0.1 mg via ORAL
  Filled 2019-10-08: qty 1

## 2019-10-08 MED ORDER — IOHEXOL 350 MG/ML SOLN
100.0000 mL | Freq: Once | INTRAVENOUS | Status: AC | PRN
  Administered 2019-10-08: 80 mL via INTRAVENOUS

## 2019-10-08 MED ORDER — HYDROMORPHONE HCL 1 MG/ML IJ SOLN
1.0000 mg | Freq: Once | INTRAMUSCULAR | Status: AC
  Administered 2019-10-08: 1 mg via INTRAVENOUS
  Filled 2019-10-08: qty 1

## 2019-10-08 MED ORDER — HYDROCODONE-ACETAMINOPHEN 5-325 MG PO TABS: 1.0000 | ORAL_TABLET | ORAL | 0 refills | Status: DC | PRN

## 2019-10-08 MED ORDER — ONDANSETRON HCL 4 MG/2ML IJ SOLN
4.0000 mg | Freq: Once | INTRAMUSCULAR | Status: AC
  Administered 2019-10-08: 4 mg via INTRAVENOUS
  Filled 2019-10-08: qty 2

## 2019-10-08 MED ORDER — IOHEXOL 9 MG/ML PO SOLN
ORAL | Status: AC
  Filled 2019-10-08: qty 1000

## 2019-10-08 NOTE — Discharge Instructions (Addendum)
Your lab tests and imaging are negative for any acute or surgical findings.  You do have a suggestion of inflammation of your distal esophagus and it is recommended that you increase your protonix dose to 1 tablet twice daily for the next 10 days. Make sure to not miss any doses of your clonidine because this can cause severe rebound high blood pressure.  Call Dr. Gala Romney for an office visit for a recheck of your abdominal pain if your symptoms persist.  You have been prescribed hydrocodone for pain control for the next several days to use as needed - however this cannot be a long term medication.  This will make you drowsy - do not drive within 4 hours of taking this medicine.

## 2019-10-08 NOTE — ED Provider Notes (Signed)
[facility] EMERGENCY DEPARTMENT Provider Note   CSN: [id number] Arrival date & time: 10/08/19  1114     History Chief Complaint  Patient presents with  . Abdominal Pain    [name] is a 54 y.o. male.  HPI      [name] is a 54 y.o. male with past medical history of pancreatitis, diverticulitis (with previous perforation) renal insufficiency and adenoma of the colon, who presents to the Emergency Department complaining of diffuse abdominal pain that began 3 days ago.  He was seen and treated at [place] on Thursday and was discharged home.  He reports recurrence of his abdominal pain after attempting to eat solid food last night.  Pain is associated with nausea and belching, but he denies vomiting.  He states that he did belch up something that looked like blood.  Pain radiates to his mid chest.  No diarrhea, or arm pain.  He reports intermittent sweats and chills but no known fever.  He describes his pain is constant and dull.  Pain does not radiate into his back.  He denies dysuria or shortness of breath.   Past Medical History:  Diagnosis Date  . Acute pancreatitis   . Anxiety   . Arthritis   . Cancer ([place])    Adenoma in colon  . Cannabis abuse   . COPD (chronic obstructive pulmonary disease) ([place])   . Depression   . Diverticulitis of colon with perforation 08/2010  . Dyspnea   . GERD (gastroesophageal reflux disease)   . History of hiatal hernia   . Hypertension    taken off lisinopril by renal md 08/2011  . Renal insufficiency    follwed by Dr. Hinda Lenis  . Sleep apnea     Patient Active Problem List   Diagnosis Date Noted  . 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 Chronic obstructive pulmonary disease ([place]) 12/14/2017  . Esophageal dysphagia 09/24/2017  . History of reversal of ileostomy 09/24/2017  . History of colostomy reversal 09/24/2017  . History of colonic polyps 09/24/2017  . Head ache 05/06/2012  . Chronic abdominal pain 05/06/2012  .  Vomiting 05/06/2012  . GERD (gastroesophageal reflux disease) 03/15/2012  . Gastritis and duodenitis 03/15/2012  . Chronic generalized abdominal pain 02/10/2012  . Epigastric pain 02/10/2012  . Nausea 02/10/2012  . Diverticulitis of colon with perforation 02/10/2012  . ANXIETY 10/05/2009  . CANNABIS ABUSE 10/05/2009  . HTN (hypertension) 10/05/2009  . ACUTE PANCREATITIS 10/05/2009  . HEMATEMESIS 10/05/2009  . HEMATOCHEZIA 10/05/2009    Past Surgical History:  Procedure Laterality Date  . APPENDECTOMY    . BIOPSY  12/14/2017   Procedure: BIOPSY;  Surgeon: Daneil Dolin, MD;  Location[facility];  Service: Endoscopy;;  gastric   . clamp left in colon after abd surgery    . COLON SURGERY  05/2011   [facility]. exp laparotomy with colostomy takedown, lysis of adhesions (3hours), loop ileostomy. complicated by abscess formation requiring percutaneous drainage  . colonoscopy via stoma with endoscopy of [name]  04/2011   [facility]. two sessile polyps in trv colon. complete resection but only partial retrieval. congested mucosa in area 3cm proximal to stoma, erythematous mucosa in Hartmann pouch. repeat tcs 8/2017path-adenomatous polyp  . COLONOSCOPY WITH PROPOFOL N/A 12/14/2017   Dr. Gala Romney: 2 4 to 6 mm polyps in the cecum, tubular adenomas, single rectal polyp tubular adenoma, nonbleeding internal hemorrhoids.  Appears to be a surgical clip at the appendiceal orifice.  Next colonoscopy in  5 years  . ESOPHAGOGASTRODUODENOSCOPY  03/05/2012   erosive reflux esophagitis, bulbar erosions, gastric erosions/petichae, bx showed gastritis, no H.Pylori. Procedure: ESOPHAGOGASTRODUODENOSCOPY (EGD);  Surgeon: Daneil Dolin, MD;  Location[facility];  Service: Endoscopy;  Laterality: N/A;  11:30  . ESOPHAGOGASTRODUODENOSCOPY (EGD) WITH PROPOFOL N/A 12/14/2017   Dr. Gala Romney: Erosive reflux esophagitis with questionable mild stricturing/non-obstruction at this level status post dilation.  multiple gastric  erosions in the stomach biopsy showed reactive gastropathy but no H. pylori.  Marland Kitchen HERNIA REPAIR    . KNEE SURGERY Right 2006  . MALONEY DILATION N/A 12/14/2017   Procedure: Venia Minks DILATION;  Surgeon: Daneil Dolin, MD;  Location[facility];  Service: Endoscopy;  Laterality: N/A;  . MULTIPLE EXTRACTIONS WITH ALVEOLOPLASTY  02/23/2012   Procedure: MULTIPLE EXTRACION WITH ALVEOLOPLASTY;  Surgeon: Gae Bon, DDS;  Location: [facility];  Service: Oral Surgery;  Laterality: Bilateral;  Removal of left mandibular torus, multiple extractions with alveoloplasty  . POLYPECTOMY  12/14/2017   Procedure: POLYPECTOMY;  Surgeon: Daneil Dolin, MD;  [facility];  Service: Endoscopy;;  . reversal of colostomy  10/2011   per patient, did not have report  . right knee surgery    . sigmoid colectomy with end colostomy, Hartmann's pouch  08/2010   [facility]. complicated diverticulitis  . UMBILICAL HERNIA REPAIR         Family History  Problem Relation Age of Onset  . Stomach cancer Father   . Lung cancer Father   . Throat cancer Father   . Cancer Father        lung and throat cancer  . Hypertension Father   . Liver disease Cousin        cirrhosis, etoh  . Throat cancer Paternal Uncle   . Lung cancer Paternal Uncle   . Colon cancer Cousin 23  . Heart disease Mother   . Hypertension Mother   . Colon cancer Maternal Uncle   . Throat cancer Paternal Uncle   . Lung cancer Paternal Uncle   . Throat cancer Paternal Uncle   . Lung cancer Paternal Uncle     Social History   Tobacco Use  . Smoking status: Current Every Day Smoker    Packs/day: 1.00    Years: 37.00    Pack years: 37.00    Types: Cigarettes  . Smokeless tobacco: Former Network engineer Use Topics  . Alcohol use: No    Comment: former user quit in 2014. previous weekend drinker  . Drug use: Yes    Frequency: 7.0 times per week    Types: Marijuana    Comment: once daily    Home Medications Prior to Admission  medications   Medication Sig Start Date End Date Taking? Authorizing Provider  albuterol (PROVENTIL HFA;VENTOLIN HFA) 108 (90 Base) MCG/ACT inhaler Inhale 2 puffs into the lungs every 6 (six) hours as needed for wheezing or shortness of breath.    [provider]  busPIRone (BUSPAR) 10 MG tablet Take 10 mg by mouth 3 (three) times daily.    [provider]  cloNIDine (CATAPRES) 0.1 MG tablet Take 0.3 mg by mouth 2 (two) times daily.  09/20/18   [provider]  cyclobenzaprine (FLEXERIL) 10 MG tablet Take 10 mg by mouth 3 (three) times daily as needed for muscle spasms.    [provider]  mometasone-formoterol (DULERA) 100-5 MCG/ACT AERO Inhale 2 puffs into the lungs 2 (two) times daily.    [provider]  pantoprazole (PROTONIX) 40 MG tablet Take 1 tablet (40 mg total) by mouth daily. 09/08/19   Carlis Stable, NP    Allergies    Codeine, Latex, Lisinopril, Morphine, and Adhesive [tape]  Review of Systems   Review of Systems  Constitutional: Positive for chills. Negative for appetite change and fever.  Respiratory: Negative for shortness of breath.   Cardiovascular: Positive for chest pain.  Gastrointestinal: Positive for abdominal pain and nausea. Negative for abdominal distention, blood in stool, diarrhea and vomiting.  Genitourinary: Negative for decreased urine volume, difficulty urinating, dysuria and flank pain.  Musculoskeletal: Negative for back pain.  Skin: Negative for color change and rash.  Neurological: Negative for dizziness, weakness and numbness.  Hematological: Negative for adenopathy.    Physical Exam Updated Vital Signs BP (!) 203/120   Pulse 68   Resp 14   Ht 5\' 7"  (1.702 m)   Wt 77.1 kg   SpO2 98%   BMI 26.63 kg/m   Physical Exam Vitals and nursing note reviewed.  Constitutional:      Appearance: He is not toxic-appearing.     Comments: Patient appears somewhat disheveled  HENT:     Head: Atraumatic.      Mouth/Throat:     Mouth: Mucous membranes are moist.  Cardiovascular:     Rate and Rhythm: Normal rate and regular rhythm.     Pulses: Normal pulses.  Pulmonary:     Effort: Pulmonary effort is normal.     Breath sounds: Normal breath sounds.  Chest:     Chest wall: No tenderness.  Abdominal:     General: There is no distension.     Palpations: Abdomen is soft. There is no mass.     Tenderness: There is abdominal tenderness. There is no guarding.  Musculoskeletal:        General: Normal range of motion.  Skin:    General: Skin is warm.     Capillary Refill: Capillary refill takes less than 2 seconds.     Findings: No erythema or rash.  Neurological:     Mental Status: He is alert and oriented to person, place, and time.     Sensory: Sensation is intact. No sensory deficit.     Motor: Motor function is intact. No weakness.     Comments: CN II-XII grossly intact     ED Results / Procedures / Treatments   Labs (all labs ordered are listed, but only abnormal results are displayed) Labs Reviewed  COMPREHENSIVE METABOLIC PANEL - Abnormal; Notable for the following components:      Result Value   Glucose, Bld 101 (*)    AST 14 (*)    All other components within normal limits  CBC WITH DIFFERENTIAL/PLATELET - Abnormal; Notable for the following components:   Hemoglobin 17.3 (*)    Neutro Abs 7.8 (*)    All other components within normal limits  LIPASE, BLOOD  URINALYSIS, ROUTINE W REFLEX MICROSCOPIC  TROPONIN I (HIGH SENSITIVITY)  TROPONIN I (HIGH SENSITIVITY)    EKG None  Radiology DG Chest Portable 1 View  Result Date: 10/08/2019 CLINICAL DATA:  Chest pain and generalized abdominal pain over the past week. EXAM: PORTABLE CHEST 1 VIEW COMPARISON:  10/06/2019 FINDINGS: Lungs are adequately inflated without consolidation or effusion. Cardiomediastinal silhouette and remainder of the exam is unchanged. IMPRESSION: No active disease. Electronically Signed   By: Marin Olp  M.D.   On: 10/08/2019 12:22    Procedures Procedures (including critical  care time)  Medications Ordered in ED Medications  fentaNYL (SUBLIMAZE) injection 50 mcg (50 mcg Intravenous Given 10/08/19 1232)  iohexol (OMNIPAQUE) 300 MG/ML solution 100 mL (100 mLs Intravenous Contrast Given 10/08/19 1331)  fentaNYL (SUBLIMAZE) injection 50 mcg (50 mcg Intravenous Given 10/08/19 1444)  iohexol (OMNIPAQUE) 9 MG/ML oral solution (  Contrast Given 10/08/19 1447)  cloNIDine (CATAPRES) tablet 0.1 mg (0.1 mg Oral Given 10/08/19 1621)  pantoprazole (PROTONIX) injection 40 mg (40 mg Intravenous Given 10/08/19 1621)    ED Course  I have reviewed the triage vital signs and the nursing notes.  Pertinent labs & imaging results that were available during my care of the patient were reviewed by me and considered in my medical decision making (see chart for details).    MDM Rules/Calculators/A&P                      Patient with history of pancreatitis, diverticulitis and chronic abdominal pain recently seen at another emergency department, comes here today for recurrence of his abdominal pain after attempting to eat solid food.  He is nontoxic-appearing.  Reports belching up something that appeared to be blood but this is not happened during his ED stay.  Doubt surgical abdomen.  His pain has been addressed with fentanyl x2 doses.  Patient continuing to call out to the nursing staff requesting additional pain medication.  Do not feel that additional pain medication is warranted at this time. Patient also hypertensive, reports that he takes clonidine for his high blood pressure.  A dose of clonidine has been ordered here.  Doubt cardiac involvement.  Patient signed out to Evalee Jefferson, PA-C at end of shift with CT scan of the abdomen pelvis still pending.  Most likely patient will be discharged home and advised to follow-up with GI.   Final Clinical Impression(s) / ED Diagnoses Final diagnoses:  None    Rx / DC  Orders ED Discharge Orders    None       Kem Parkinson, PA-C 10/08/19 1704    Ezequiel Essex, MD 10/08/19 2246

## 2019-10-08 NOTE — ED Triage Notes (Signed)
Pt reports generalized abd pain last week.  Reports went to [facility] ED and was discharged.  Denies n/v/d.  LBM was last night and was normal per pt.  Denies urinary symptoms.  Reports he belched up blood pta.

## 2019-10-08 NOTE — ED Provider Notes (Signed)
Pt signed out to me at change of shift from [facility] [place].    Pt with acute on chronic lower abdominal pain with stating he has had chronic abd pain sx since ruptured appy surgery years ago.  Followed by Dr. Gala Romney.  Pending CT imaging to r/o obstruction or diverticulitis.  Ct. Imaging negative for acute intra abdominal process.  He does have some distal esophageal thickening c/w esophagitis.  Denies chest pain or dysphagia. Does have reflux, currently on protonix.  Better relief obtained when on dexilant but unable to afford.    During ed visit, pt remained very hypertensive.  He states when he was incarcerated also had good bp control with combination of norvasc, a fluid pill and valium?.  BP has been poorly controlled since establishing here.  Has tried multiple meds without improvement.  Currently on clonidine 0.2 mg bid, but endorses missing this am's dose.  Given here.  Remained hypertensive.  Pt seen by Dr Wyvonnia Dusky during this visit, recommended CTA chest to rule out dissection which was negative for this condition.  Also given dilaudid for abd pain control and bp became much improved.  Advised increased protonix to bid dosing given suggestion of esophagitis on CT and plain films.  Plan f/u with GI for f/u of sx.   Results for orders placed or performed during the hospital encounter of 10/08/19  Comprehensive metabolic panel  Result Value Ref Range   Sodium 136 135 - 145 mmol/L   Potassium 3.7 3.5 - 5.1 mmol/L   Chloride 99 98 - 111 mmol/L   CO2 27 22 - 32 mmol/L   Glucose, Bld 101 (H) 70 - 99 mg/dL   BUN 16 6 - 20 mg/dL   Creatinine, Ser 1.06 0.61 - 1.24 mg/dL   Calcium 8.9 8.9 - 10.3 mg/dL   Total Protein 7.8 6.5 - 8.1 g/dL   Albumin 4.2 3.5 - 5.0 g/dL   AST 14 (L) 15 - 41 U/L   ALT 13 0 - 44 U/L   Alkaline Phosphatase 71 38 - 126 U/L   Total Bilirubin 0.7 0.3 - 1.2 mg/dL   GFR calc non Af Amer >60 >60 mL/min   GFR calc Af Amer >60 >60 mL/min   Anion gap 10 5 - 15  Lipase,  blood  Result Value Ref Range   Lipase 15 11 - 51 U/L  CBC with Diff  Result Value Ref Range   WBC 10.5 4.0 - 10.5 K/uL   RBC 5.66 4.22 - 5.81 MIL/uL   Hemoglobin 17.3 (H) 13.0 - 17.0 g/dL   HCT 51.5 39.0 - 52.0 %   MCV 91.0 80.0 - 100.0 fL   MCH 30.6 26.0 - 34.0 pg   MCHC 33.6 30.0 - 36.0 g/dL   RDW 12.7 11.5 - 15.5 %   Platelets 215 150 - 400 K/uL   nRBC 0.0 0.0 - 0.2 %   Neutrophils Relative % 75 %   Neutro Abs 7.8 (H) 1.7 - 7.7 K/uL   Lymphocytes Relative 19 %   Lymphs Abs 2.0 0.7 - 4.0 K/uL   Monocytes Relative 6 %   Monocytes Absolute 0.6 0.1 - 1.0 K/uL   Eosinophils Relative 0 %   Eosinophils Absolute 0.0 0.0 - 0.5 K/uL   Basophils Relative 0 %   Basophils Absolute 0.0 0.0 - 0.1 K/uL   Immature Granulocytes 0 %   Abs Immature Granulocytes 0.03 0.00 - 0.07 K/uL  Urinalysis, Routine w reflex microscopic  Result Value Ref  Range   Color, Urine YELLOW YELLOW   APPearance CLEAR CLEAR   Specific Gravity, Urine 1.010 1.005 - 1.030   pH 8.0 5.0 - 8.0   Glucose, UA NEGATIVE NEGATIVE mg/dL   Hgb urine dipstick NEGATIVE NEGATIVE   Bilirubin Urine NEGATIVE NEGATIVE   Ketones, ur NEGATIVE NEGATIVE mg/dL   Protein, ur NEGATIVE NEGATIVE mg/dL   Nitrite NEGATIVE NEGATIVE   Leukocytes,Ua NEGATIVE NEGATIVE  Troponin I (High Sensitivity)  Result Value Ref Range   Troponin I (High Sensitivity) 3 <18 ng/L  Troponin I (High Sensitivity)  Result Value Ref Range   Troponin I (High Sensitivity) 3 <18 ng/L   CT ABDOMEN PELVIS W CONTRAST  Result Date: 10/08/2019 CLINICAL DATA:  Diverticulitis suspected. Generalized abdominal pain. Renal insufficiency. History of pancreatitis and colon cancer. History of prior appendectomy. EXAM: CT ABDOMEN AND PELVIS WITH CONTRAST TECHNIQUE: Multidetector CT imaging of the abdomen and pelvis was performed using the standard protocol following bolus administration of intravenous contrast. CONTRAST:  125mL OMNIPAQUE IOHEXOL 300 MG/ML  SOLN COMPARISON:   October 06, 2019 FINDINGS: Lower chest: The lung bases are clear. The heart size is normal. Hepatobiliary: The liver is normal. Normal gallbladder.There is no biliary ductal dilation. Pancreas: Normal contours without ductal dilatation. No peripancreatic fluid collection. Spleen: No splenic laceration or hematoma. Adrenals/Urinary Tract: --Adrenal glands: No adrenal hemorrhage. --Right kidney/ureter: No hydronephrosis or perinephric hematoma. --Left kidney/ureter: No hydronephrosis or perinephric hematoma. --Urinary bladder: Unremarkable. Stomach/Bowel: --Stomach/Duodenum: There is mild diffuse wall thickening of the distal esophagus. The stomach is unremarkable. --Small bowel: No dilatation or inflammation. --Colon: Patient is status post prior colectomy. There is no evidence for diverticulitis. --Appendix: Surgically absent. Vascular/Lymphatic: Atherosclerotic calcification is present within the non-aneurysmal abdominal aorta, without hemodynamically significant stenosis. --No retroperitoneal lymphadenopathy. --No mesenteric lymphadenopathy. --No pelvic or inguinal lymphadenopathy. Reproductive: Unremarkable Other: No ascites or free air. There are bilateral fat containing inguinal hernias. Musculoskeletal. No acute displaced fractures. IMPRESSION: 1. No CT evidence of acute intra-abdominal or pelvic abnormalities. 2. Patient is status post prior partial colectomy. No evidence of diverticulitis. 3. Mild diffuse wall thickening of the distal esophagus. Recommend correlation for symptoms of esophagitis. 4. Bilateral fat containing inguinal hernias. Aortic Atherosclerosis (ICD10-I70.0). Electronically Signed   By: Constance Holster M.D.   On: 10/08/2019 17:37   DG Chest Portable 1 View  Result Date: 10/08/2019 CLINICAL DATA:  Chest pain and generalized abdominal pain over the past week. EXAM: PORTABLE CHEST 1 VIEW COMPARISON:  10/06/2019 FINDINGS: Lungs are adequately inflated without consolidation or effusion.  Cardiomediastinal silhouette and remainder of the exam is unchanged. IMPRESSION: No active disease. Electronically Signed   By: Marin Olp M.D.   On: 10/08/2019 12:22   CT ANGIO CHEST AORTA W/CM & OR WO/CM  Result Date: 10/08/2019 CLINICAL DATA:  Primary essential hypertension, increasing chest pain, rule out thoracic aortic aneurysm EXAM: CT ANGIOGRAPHY CHEST WITH CONTRAST TECHNIQUE: Multidetector CT imaging of the chest was performed using the standard protocol during bolus administration of intravenous contrast. Multiplanar CT image reconstructions and MIPs were obtained to evaluate the vascular anatomy. CONTRAST:  35mL OMNIPAQUE IOHEXOL 350 MG/ML SOLN COMPARISON:  CT 04/04/2007 FINDINGS: Cardiovascular: Preferential opacification of the thoracic aorta. No evidence of thoracic aortic aneurysm or dissection. No periaortic stranding or hemorrhage. Thoracic aortic atherosclerosis is present. Minimal plaque in the normally branching great vessels as well. Proximal great vessels normally opacified. Normal heart size. No pericardial effusion. Coronary artery atherosclerosis is noted. Central pulmonary arteries are suboptimally opacified on this  exam no large central filling defects are identified. Mediastinum/Nodes: No enlarged mediastinal, hilar, or axillary lymph nodes. Thyroid gland and thoracic inlet are unremarkable. No acute abnormality of the trachea. There is diffuse edematous thickening of the thoracic esophagus with some faint periaortic stranding at the level of the diaphragmatic hiatus (7/83). Lungs/Pleura: Mild paraseptal emphysematous changes towards the lung apices. No consolidation, features of edema, pneumothorax, or effusion. Bandlike atelectasis is noted posteriorly towards the lung bases. No suspicious pulmonary nodules or masses. Upper Abdomen: No acute abnormalities present in the visualized portions of the upper abdomen. Musculoskeletal: No acute osseous abnormality or suspicious osseous  lesion. Schmorl's nodes in the thoracic spine are similar to comparison. Review of the MIP images confirms the above findings. IMPRESSION: 1. Thoracic aortic atherosclerosis with no evidence of aneurysm or dissection. 2. Diffuse edematous thickening of the thoracic esophagus with some faint periaortic stranding at the level of the diaphragmatic hiatus, may represent esophagitis. 3. No other acute thoracic abnormality. 4. Coronary artery atherosclerosis. 5. Paraseptal Emphysema (ICD10-J43.9). 6. Aortic Atherosclerosis (ICD10-I70.0). Electronically Signed   By: Lovena Le M.D.   On: 10/08/2019 22:19      Landis Martins 10/08/19 2235    Ezequiel Essex, MD 10/08/19 2245

## 2019-10-10 ENCOUNTER — Other Ambulatory Visit: Payer: Self-pay | Admitting: Physician Assistant

## 2019-10-10 MED ORDER — CLONIDINE HCL 0.1 MG PO TABS: 0.2000 mg | ORAL_TABLET | Freq: Two times a day (BID) | ORAL | 1 refills | Status: DC

## 2019-10-10 MED FILL — Hydrocodone-Acetaminophen Tab 5-325 MG: ORAL | Qty: 6 | Status: AC

## 2019-10-12 ENCOUNTER — Other Ambulatory Visit (HOSPITAL_COMMUNITY)
Admission: RE | Admit: 2019-10-12 | Discharge: 2019-10-12 | Disposition: A | Discharge status: Home / Self Care | Payer: Self-pay | Source: Ambulatory Visit | Attending: Nurse Practitioner | Admitting: Nurse Practitioner

## 2019-10-12 ENCOUNTER — Ambulatory Visit (INDEPENDENT_AMBULATORY_CARE_PROVIDER_SITE_OTHER): Payer: Self-pay | Admitting: Nurse Practitioner

## 2019-10-12 ENCOUNTER — Encounter: Payer: Self-pay | Admitting: Nurse Practitioner

## 2019-10-12 ENCOUNTER — Other Ambulatory Visit (HOSPITAL_COMMUNITY)
Admission: RE | Admit: 2019-10-12 | Discharge: 2019-10-12 | Disposition: A | Discharge status: Home / Self Care | Payer: Self-pay | Source: Ambulatory Visit | Attending: Internal Medicine | Admitting: Internal Medicine

## 2019-10-12 ENCOUNTER — Encounter: Payer: Self-pay | Admitting: Internal Medicine

## 2019-10-12 ENCOUNTER — Encounter (HOSPITAL_COMMUNITY): Payer: Self-pay

## 2019-10-12 ENCOUNTER — Other Ambulatory Visit: Payer: Self-pay

## 2019-10-12 ENCOUNTER — Encounter (HOSPITAL_COMMUNITY)
Admission: RE | Admit: 2019-10-12 | Discharge: 2019-10-12 | Disposition: A | Discharge status: Home / Self Care | Payer: Self-pay | Source: Ambulatory Visit | Attending: Internal Medicine | Admitting: Internal Medicine

## 2019-10-12 DIAGNOSIS — Z20822 Contact with and (suspected) exposure to covid-19: Secondary | ICD-10-CM | POA: Insufficient documentation

## 2019-10-12 DIAGNOSIS — K921 Melena: Secondary | ICD-10-CM

## 2019-10-12 DIAGNOSIS — K209 Esophagitis, unspecified without bleeding: Secondary | ICD-10-CM

## 2019-10-12 DIAGNOSIS — R197 Diarrhea, unspecified: Secondary | ICD-10-CM | POA: Insufficient documentation

## 2019-10-12 DIAGNOSIS — Z01812 Encounter for preprocedural laboratory examination: Secondary | ICD-10-CM | POA: Insufficient documentation

## 2019-10-12 LAB — CBC WITH DIFFERENTIAL/PLATELET
Abs Immature Granulocytes: 0.03 10*3/uL (ref 0.00–0.07)
Basophils Absolute: 0 10*3/uL (ref 0.0–0.1)
Basophils Relative: 0 %
Eosinophils Absolute: 0.1 10*3/uL (ref 0.0–0.5)
Eosinophils Relative: 1 %
HCT: 48.9 % (ref 39.0–52.0)
Hemoglobin: 16.2 g/dL (ref 13.0–17.0)
Immature Granulocytes: 0 %
Lymphocytes Relative: 32 %
Lymphs Abs: 3.3 10*3/uL (ref 0.7–4.0)
MCH: 30.7 pg (ref 26.0–34.0)
MCHC: 33.1 g/dL (ref 30.0–36.0)
MCV: 92.6 fL (ref 80.0–100.0)
Monocytes Absolute: 0.8 10*3/uL (ref 0.1–1.0)
Monocytes Relative: 7 %
Neutro Abs: 6 10*3/uL (ref 1.7–7.7)
Neutrophils Relative %: 60 %
Platelets: 227 10*3/uL (ref 150–400)
RBC: 5.28 MIL/uL (ref 4.22–5.81)
RDW: 12.6 % (ref 11.5–15.5)
WBC: 10.2 10*3/uL (ref 4.0–10.5)
nRBC: 0 % (ref 0.0–0.2)

## 2019-10-12 LAB — SARS CORONAVIRUS 2 (TAT 6-24 HRS): SARS Coronavirus 2: NEGATIVE

## 2019-10-12 MED ORDER — HYDROCODONE-ACETAMINOPHEN 5-325 MG PO TABS: 1.0000 | ORAL_TABLET | ORAL | 0 refills | Status: DC | PRN

## 2019-10-12 NOTE — Progress Notes (Signed)
Referring Provider: Soyla Dryer, PA-C Primary Care Physician:  Soyla Dryer, PA-C Primary GI:  Dr. Gala Romney  Chief Complaint  Patient presents with  . Abdominal Pain    x 1 week  . Diarrhea    looks like black coffee   . Gastroesophageal Reflux    Pantoprazole. pt has burning    HPI:   [name] is a 54 y.o. male who presents for follow-up on GERD and dysphagia.  Patient was last seen in our office 09/08/2019 for the same as well as hematochezia and proctalgia.  History of GERD generally doing well on omeprazole 40 mg daily.  Colonoscopy up-to-date.  Previous complaint of anorectal burning and itching with hematochezia.  DRE found likely rectal fissure and he was provided with compounded lidocaine and nitroglycerin.  Updated EGD and colonoscopy in April 2019 found esophagitis and mild stricturing status post dilation and multiple gastric erosions that were reactive gastropathy without H. pylori.  Colonoscopy found 2 sessile polyps and nonbleeding internal hemorrhoids.  Polyps were tubular adenoma and recommended 5-year repeat in 2024.  He previously had had improvement in his GERD and rectal discomfort on recommended medications.  At his last visit he states he lost medication assistance and has been out of Dexilant, applying for disability but initially denied.  Worsening GERD and dysphagia essentially daily, uses Tums which helps momentarily.  Occasional regurgitation, associated odynophagia.  Noted hematochezia with a clot passing about once a week in the setting of known hemorrhoids and cannot afford topical therapy so he has tried something over-the-counter.  Notes regular rectal pain, itching, irritation.  Has lost 10 to 20 pounds objectively.  No other overt GI complaints.  Recommended trying over-the-counter rectal cream and call us if no improvement we can search for cheaper alternative.  Possible candidate for hemorrhoid banding in the future.  Good Rx card for Protonix  40 mg daily and requested 2-week trial with progress report.  Avoid triggers, eat soft foods to help with dysphagia.  Follow-up in 6 to 8 weeks.  Of note his blood pressure was elevated and I recommended he follow-up with primary care.  The patient was in the emergency room for lower abdominal pain on 10/08/2019.  Found to be hypertensive with poorly controlled blood pressure and intermittent chest pain with unchanged EKGs and negative troponins.  CT scan negative for acute chest pathology.  Treated for suspected esophageal reflux and esophagitis.  Today he states he's not doing well. Has been well managed on Protonix until a week ago. Started having severe abdominal pain, esophageal burning, diarrhea. Stools turned black about 4 days ago. Was in the ER with CT indicative of esophagitis with diffuse wall thickening in the thoracic esophagus. Notes dizziness, weakness before the ER visit, but not since. BP is better today. Has nausea but has Zofran which helps. No vomiting. Drinking ensure, can't eat due to pain. On Protonix 40 mg daily, every day. ER recommended increase to bid a couple days ago and he has since done this. Denies hematochezia, fever, chills, unintentional weight loss. Denies URI or flu-like symptoms. Denies loss of sense of taste or smell. Was tested for COVID-19 in November/December which was negative. Denies chest pain, dyspnea, dizziness, lightheadedness, syncope, near syncope. Denies any other upper or lower GI symptoms.  Past Medical History:  Diagnosis Date  . Acute pancreatitis   . Anxiety   . Arthritis   . Cancer ([place])    Adenoma in colon  . Cannabis abuse   .  COPD (chronic obstructive pulmonary disease) ([place])   . Depression   . Diverticulitis of colon with perforation 08/2010  . Dyspnea   . GERD (gastroesophageal reflux disease)   . History of hiatal hernia   . Hypertension    taken off lisinopril by renal md 08/2011  . Renal insufficiency    follwed by Dr. Hinda Lenis    . Sleep apnea     Past Surgical History:  Procedure Laterality Date  . APPENDECTOMY    . BIOPSY  12/14/2017   Procedure: BIOPSY;  Surgeon: Daneil Dolin, MD;  [facility];  Service: Endoscopy;;  gastric   . clamp left in colon after abd surgery    . COLON SURGERY  05/2011   [facility]. exp laparotomy with colostomy takedown, lysis of adhesions (3hours), loop ileostomy. complicated by abscess formation requiring percutaneous drainage  . colonoscopy via stoma with endoscopy of [name]  04/2011   [facility]. two sessile polyps in trv colon. complete resection but only partial retrieval. congested mucosa in area 3cm proximal to stoma, erythematous mucosa in Hartmann pouch. repeat tcs 8/2017path-adenomatous polyp  . COLONOSCOPY WITH PROPOFOL N/A 12/14/2017   Dr. Gala Romney: 2 4 to 6 mm polyps in the cecum, tubular adenomas, single rectal polyp tubular adenoma, nonbleeding internal hemorrhoids.  Appears to be a surgical clip at the appendiceal orifice.  Next colonoscopy in 5 years  . ESOPHAGOGASTRODUODENOSCOPY  03/05/2012   erosive reflux esophagitis, bulbar erosions, gastric erosions/petichae, bx showed gastritis, no H.Pylori. Procedure: ESOPHAGOGASTRODUODENOSCOPY (EGD);  Surgeon: Daneil Dolin, MD;  [facility];  Service: Endoscopy;  Laterality: N/A;  11:30  . ESOPHAGOGASTRODUODENOSCOPY (EGD) WITH PROPOFOL N/A 12/14/2017   Dr. Gala Romney: Erosive reflux esophagitis with questionable mild stricturing/non-obstruction at this level status post dilation.  multiple gastric erosions in the stomach biopsy showed reactive gastropathy but no H. pylori.  Marland Kitchen HERNIA REPAIR    . KNEE SURGERY Right 2006  . MALONEY DILATION N/A 12/14/2017   Procedure: Venia Minks DILATION;  Surgeon: Daneil Dolin, MD;  [facility];  Service: Endoscopy;  Laterality: N/A;  . MULTIPLE EXTRACTIONS WITH ALVEOLOPLASTY  02/23/2012   Procedure: MULTIPLE EXTRACION WITH ALVEOLOPLASTY;  Surgeon: Gae Bon, DDS;   Location: [place];  Service: Oral Surgery;  Laterality: Bilateral;  Removal of left mandibular torus, multiple extractions with alveoloplasty  . POLYPECTOMY  12/14/2017   Procedure: POLYPECTOMY;  Surgeon: Daneil Dolin, MD;  [facility];  Service: Endoscopy;;  . reversal of colostomy  10/2011   per patient, did not have report  . right knee surgery    . sigmoid colectomy with end colostomy, Hartmann's pouch  08/2010   [facility]. complicated diverticulitis  . UMBILICAL HERNIA REPAIR      Current Outpatient Medications  Medication Sig Dispense Refill  . albuterol (PROVENTIL HFA;VENTOLIN HFA) 108 (90 Base) MCG/ACT inhaler Inhale 2 puffs into the lungs every 6 (six) hours as needed for wheezing or shortness of breath.    . busPIRone (BUSPAR) 10 MG tablet Take 10 mg by mouth 3 (three) times daily.    . cloNIDine (CATAPRES) 0.1 MG tablet Take 2 tablets (0.2 mg total) by mouth 2 (two) times daily. (Patient taking differently: Take 0.2 mg by mouth 3 (three) times daily. ) 60 tablet 1  . cyclobenzaprine (FLEXERIL) 10 MG tablet Take 10 mg by mouth 3 (three) times daily as needed for muscle spasms.    Marland Kitchen HYDROcodone-acetaminophen (NORCO/VICODIN) 5-325 MG tablet Take 2 tablets by mouth  every 4 (four) hours as needed. 6 tablet 0  . HYDROcodone-acetaminophen (NORCO/VICODIN) 5-325 MG tablet Take 1 tablet by mouth every 4 (four) hours as needed. 10 tablet 0  . mometasone-formoterol (DULERA) 100-5 MCG/ACT AERO Inhale 2 puffs into the lungs 2 (two) times daily.    . ondansetron (ZOFRAN ODT) 4 MG disintegrating tablet Take 1 tablet (4 mg total) by mouth every 8 (eight) hours as needed for nausea or vomiting. 20 tablet 0  . ondansetron (ZOFRAN) 4 MG tablet Take 4 mg by mouth every 6 (six) hours as needed.    . pantoprazole (PROTONIX) 40 MG tablet Take 1 tablet (40 mg total) by mouth daily. 30 tablet 5   No current facility-administered medications for this visit.    Allergies as of 10/12/2019 - Review  Complete 10/12/2019  Allergen Reaction Noted  . Codeine Itching and Nausea And Vomiting   . Latex Other (See Comments) 03/09/2012  . Lisinopril  05/27/2017  . Morphine Hives   . Adhesive [tape] Rash 03/08/2012    Family History  Problem Relation Age of Onset  . Stomach cancer Father   . Lung cancer Father   . Throat cancer Father   . Cancer Father        lung and throat cancer  . Hypertension Father   . Liver disease Cousin        cirrhosis, etoh  . Throat cancer Paternal Uncle   . Lung cancer Paternal Uncle   . Colon cancer Cousin 76  . Heart disease Mother   . Hypertension Mother   . Colon cancer Maternal Uncle   . Throat cancer Paternal Uncle   . Lung cancer Paternal Uncle   . Throat cancer Paternal Uncle   . Lung cancer Paternal Uncle     Social History   Socioeconomic History  . Marital status: Single    Spouse name: Not on file  . Number of children: 0  . Years of education: Not on file  . Highest education level: Not on file  Occupational History  . Not on file  Tobacco Use  . Smoking status: Current Every Day Smoker    Packs/day: 1.00    Years: 37.00    Pack years: 37.00    Types: Cigarettes  . Smokeless tobacco: Former Network engineer and Sexual Activity  . Alcohol use: No    Comment: former user quit in 2014. previous weekend drinker  . Drug use: Yes    Frequency: 7.0 times per week    Types: Marijuana    Comment: once daily  . Sexual activity: Not on file  Other Topics Concern  . Not on file  Social History Narrative  . Not on file   Social Determinants of Health   Financial Resource Strain:   . Difficulty of Paying Living Expenses: Not on file  Food Insecurity:   . Worried About Charity fundraiser in the Last Year: Not on file  . Ran Out of Food in the Last Year: Not on file  Transportation Needs:   . Lack of Transportation (Medical): Not on file  . Lack of Transportation (Non-Medical): Not on file  Physical Activity:   . Days of  Exercise per Week: Not on file  . Minutes of Exercise per Session: Not on file  Stress:   . Feeling of Stress : Not on file  Social Connections:   . Frequency of Communication with Friends and Family: Not on file  . Frequency of Social Gatherings  with Friends and Family: Not on file  . Attends Religious Services: Not on file  . Active Member of Clubs or Organizations: Not on file  . Attends Archivist Meetings: Not on file  . Marital Status: Not on file    Review of Systems: General: Negative for anorexia, weight loss, fever, chills, fatigue, weakness. ENT: Negative for hoarseness, difficulty swallowing. CV: Negative for chest pain, angina, palpitations, peripheral edema.  Respiratory: Negative for dyspnea at rest, cough, sputum, wheezing.  GI: See history of present illness. Endo: Negative for unusual weight change.  Heme: Negative for bruising or bleeding. Allergy: Negative for rash or hives.   Physical Exam: BP 116/86   Pulse 71   Temp (!) 97.3 F (36.3 C) (Oral)   Ht 5\' 7"  (1.702 m)   Wt 168 lb 9.6 oz (76.5 kg)   BMI 26.41 kg/m  General:   Alert and oriented. Pleasant and cooperative. Well-nourished and well-developed. Appears in pain. Eyes:  Without icterus, sclera clear and conjunctiva pink.  Ears:  Normal auditory acuity. Cardiovascular:  S1, S2 present without murmurs appreciated. Extremities without clubbing or edema. Respiratory:  Clear to auscultation bilaterally. No wheezes, rales, or rhonchi. No distress.  Gastrointestinal:  +BS, soft, and non-distended. Significant generalized abdominal TTP. No HSM noted. No guarding or rebound. No masses appreciated.  Rectal:  Deferred  Musculoskalatal:  Symmetrical without gross deformities. Neurologic:  Alert and oriented x4;  grossly normal neurologically. Psych:  Alert and cooperative. Normal mood and affect. Heme/Lymph/Immune: No excessive bruising noted.    10/12/2019 8:51 AM   Disclaimer: This note was  dictated with voice recognition software. Similar sounding words can inadvertently be transcribed and may not be corrected upon review.

## 2019-10-12 NOTE — Assessment & Plan Note (Signed)
The patient has been having esophagitis symptoms as per above for the past week.  About 4 days ago he began having black stools.  He states this is like diarrhea.  When described the incidence of "black tarry stools" he said that is what is like, just like tar.  At this point I will check a stat CBC evening get a CBC to days ago to check for any worsening hemoglobin.  EGD as per above.  Further recommendations to follow.  Continue PPI otherwise.

## 2019-10-12 NOTE — Progress Notes (Addendum)
Patient again requesting pain medication. Initially told me he had 2-3 left from the ER. Has told nursing staff he only has 1 left and if it doesn't last he'll go back to the ER. Will provide limited Rx refill.  PDMP reviewed: OD risk 200. 1 Rx in past 30 days for controlled substances

## 2019-10-12 NOTE — Assessment & Plan Note (Signed)
The patient is having significant abdominal pain.  He appears quite uncomfortable in the exam room today.  Is is in the ER 2 days ago and CT imaging found diffusely thickened esophagus in the thoracic region consistent with esophagitis.  He was given pain medicine by the ER.  He has been taking Protonix once daily and until 1 week ago was doing well with this.  Since he was in the ER he is increase this to twice a day.  I recommend he continue take PPI twice a day.  Also concerning is black stools, as per below.  Also describes no food in the past 2 days, only Ensure due to pain with eating.  Given his constellation of symptoms we will check a stat CBC and set him up for an EGD ASAP.  Further recommendations to follow.  Proceed with EGD on propofol/MAC with Dr. Gala Romney in near future: the risks, benefits, and alternatives have been discussed with the patient in detail. The patient states understanding and desires to proceed.  The patient is currently on BuSpar, Flexeril, hydrocodone. The patient is not on any other anticoagulants, anxiolytics, chronic pain medications, antidepressants, antidiabetics, or iron supplements.  We will plan for the procedure on propofol/MAC to promote adequate sedation.

## 2019-10-12 NOTE — Patient Instructions (Signed)
Your health issues we discussed today were:   Abdominal pain/likely esophagitis (inflammation of your esophagus from reflux): 1. Continue taking Protonix twice a day.  Take it for sing in the morning before eating and 30 minutes for your last meal the day 2. Have your blood drawn as soon as you can so we will hopefully get the labs back today to make sure you are not having any significant bleeding 3. We are scheduling you for an upper endoscopy, hopefully tomorrow.  Scheduler will discuss timing and preparation with you 4. Further recommendations will follow your upper endoscopy  "Diarrhea" (black, tarry stools): 1. EGD being scheduled to help determine if the black stools are from bleeding from your esophagus or stomach 2. Call us in 1 to 2 weeks after your upper endoscopy if you are still having diarrhea and can discuss further testing 3. Call us if you have any worsening or severe symptoms  Overall I recommend:  1. If you develop sudden chest pain, shortness of breath, severe weakness, passing out proceed to the emergency room 2. Continue your other current medications 3. Return for follow-up in 3 months 4. Call us if you have any questions or concerns   ---------------------------------------------------------------  COVID-19 Vaccine Information can be found at: [email or web address].co.uk For questions related to vaccine distribution or appointments, please email vaccine@[email or web address] or call 3513717284.   ---------------------------------------------------------------   At [facility] we value your feedback. You may receive a survey about your visit today. Please share your experience as we strive to create trusting relationships with our patients to provide genuine, compassionate, quality care.  We appreciate your understanding and patience as we review any laboratory studies, imaging, and other diagnostic tests  that are ordered as we care for you. Our office policy is 5 business days for review of these results, and any emergent or urgent results are addressed in a timely manner for your best interest. If you do not hear from our office in 1 week, please contact us.   We also encourage the use of MyChart, which contains your medical information for your review as well. If you are not enrolled in this feature, an access code is on this after visit summary for your convenience. Thank you for allowing [place] to be involved in your care.  It was great to see you today!  I hope you feel better soon!!

## 2019-10-12 NOTE — H&P (View-Only) (Signed)
Referring Provider: Soyla Dryer, PA-C Primary Care Physician:  Soyla Dryer, PA-C Primary GI:  Dr. Gala Romney  Chief Complaint  Patient presents with  . Abdominal Pain    x 1 week  . Diarrhea    looks like black coffee   . Gastroesophageal Reflux    Pantoprazole. pt has burning    HPI:   [name] is a 54 y.o. male who presents for follow-up on GERD and dysphagia.  Patient was last seen in our office 09/08/2019 for the same as well as hematochezia and proctalgia.  History of GERD generally doing well on omeprazole 40 mg daily.  Colonoscopy up-to-date.  Previous complaint of anorectal burning and itching with hematochezia.  DRE found likely rectal fissure and he was provided with compounded lidocaine and nitroglycerin.  Updated EGD and colonoscopy in April 2019 found esophagitis and mild stricturing status post dilation and multiple gastric erosions that were reactive gastropathy without H. pylori.  Colonoscopy found 2 sessile polyps and nonbleeding internal hemorrhoids.  Polyps were tubular adenoma and recommended 5-year repeat in 2024.  He previously had had improvement in his GERD and rectal discomfort on recommended medications.  At his last visit he states he lost medication assistance and has been out of Dexilant, applying for disability but initially denied.  Worsening GERD and dysphagia essentially daily, uses Tums which helps momentarily.  Occasional regurgitation, associated odynophagia.  Noted hematochezia with a clot passing about once a week in the setting of known hemorrhoids and cannot afford topical therapy so he has tried something over-the-counter.  Notes regular rectal pain, itching, irritation.  Has lost 10 to 20 pounds objectively.  No other overt GI complaints.  Recommended trying over-the-counter rectal cream and call us if no improvement we can search for cheaper alternative.  Possible candidate for hemorrhoid banding in the future.  Good Rx card for Protonix  40 mg daily and requested 2-week trial with progress report.  Avoid triggers, eat soft foods to help with dysphagia.  Follow-up in 6 to 8 weeks.  Of note his blood pressure was elevated and I recommended he follow-up with primary care.  The patient was in the emergency room for lower abdominal pain on 10/08/2019.  Found to be hypertensive with poorly controlled blood pressure and intermittent chest pain with unchanged EKGs and negative troponins.  CT scan negative for acute chest pathology.  Treated for suspected esophageal reflux and esophagitis.  Today he states he's not doing well. Has been well managed on Protonix until a week ago. Started having severe abdominal pain, esophageal burning, diarrhea. Stools turned black about 4 days ago. Was in the ER with CT indicative of esophagitis with diffuse wall thickening in the thoracic esophagus. Notes dizziness, weakness before the ER visit, but not since. BP is better today. Has nausea but has Zofran which helps. No vomiting. Drinking ensure, can't eat due to pain. On Protonix 40 mg daily, every day. ER recommended increase to bid a couple days ago and he has since done this. Denies hematochezia, fever, chills, unintentional weight loss. Denies URI or flu-like symptoms. Denies loss of sense of taste or smell. Was tested for COVID-19 in November/December which was negative. Denies chest pain, dyspnea, dizziness, lightheadedness, syncope, near syncope. Denies any other upper or lower GI symptoms.  Past Medical History:  Diagnosis Date  . Acute pancreatitis   . Anxiety   . Arthritis   . Cancer ([place])    Adenoma in colon  . Cannabis abuse   .  COPD (chronic obstructive pulmonary disease) ([place])   . Depression   . Diverticulitis of colon with perforation 08/2010  . Dyspnea   . GERD (gastroesophageal reflux disease)   . History of hiatal hernia   . Hypertension    taken off lisinopril by renal md 08/2011  . Renal insufficiency    follwed by Dr. Hinda Lenis    . Sleep apnea     Past Surgical History:  Procedure Laterality Date  . APPENDECTOMY    . BIOPSY  12/14/2017   Procedure: BIOPSY;  Surgeon: Daneil Dolin, MD;  [facility];  Service: Endoscopy;;  gastric   . clamp left in colon after abd surgery    . COLON SURGERY  05/2011   [facility]. exp laparotomy with colostomy takedown, lysis of adhesions (3hours), loop ileostomy. complicated by abscess formation requiring percutaneous drainage  . colonoscopy via stoma with endoscopy of [name]  04/2011   [facility]. two sessile polyps in trv colon. complete resection but only partial retrieval. congested mucosa in area 3cm proximal to stoma, erythematous mucosa in Hartmann pouch. repeat tcs 8/2017path-adenomatous polyp  . COLONOSCOPY WITH PROPOFOL N/A 12/14/2017   Dr. Gala Romney: 2 4 to 6 mm polyps in the cecum, tubular adenomas, single rectal polyp tubular adenoma, nonbleeding internal hemorrhoids.  Appears to be a surgical clip at the appendiceal orifice.  Next colonoscopy in 5 years  . ESOPHAGOGASTRODUODENOSCOPY  03/05/2012   erosive reflux esophagitis, bulbar erosions, gastric erosions/petichae, bx showed gastritis, no H.Pylori. Procedure: ESOPHAGOGASTRODUODENOSCOPY (EGD);  Surgeon: Daneil Dolin, MD;  [facility];  Service: Endoscopy;  Laterality: N/A;  11:30  . ESOPHAGOGASTRODUODENOSCOPY (EGD) WITH PROPOFOL N/A 12/14/2017   Dr. Gala Romney: Erosive reflux esophagitis with questionable mild stricturing/non-obstruction at this level status post dilation.  multiple gastric erosions in the stomach biopsy showed reactive gastropathy but no H. pylori.  Marland Kitchen HERNIA REPAIR    . KNEE SURGERY Right 2006  . MALONEY DILATION N/A 12/14/2017   Procedure: Venia Minks DILATION;  Surgeon: Daneil Dolin, MD;  [facility];  Service: Endoscopy;  Laterality: N/A;  . MULTIPLE EXTRACTIONS WITH ALVEOLOPLASTY  02/23/2012   Procedure: MULTIPLE EXTRACION WITH ALVEOLOPLASTY;  Surgeon: Gae Bon, DDS;   Location: [facility];  Service: Oral Surgery;  Laterality: Bilateral;  Removal of left mandibular torus, multiple extractions with alveoloplasty  . POLYPECTOMY  12/14/2017   Procedure: POLYPECTOMY;  Surgeon: Daneil Dolin, MD;  [facility];  Service: Endoscopy;;  . reversal of colostomy  10/2011   per patient, did not have report  . right knee surgery    . sigmoid colectomy with end colostomy, Hartmann's pouch  08/2010   [facility]. complicated diverticulitis  . UMBILICAL HERNIA REPAIR      Current Outpatient Medications  Medication Sig Dispense Refill  . albuterol (PROVENTIL HFA;VENTOLIN HFA) 108 (90 Base) MCG/ACT inhaler Inhale 2 puffs into the lungs every 6 (six) hours as needed for wheezing or shortness of breath.    . busPIRone (BUSPAR) 10 MG tablet Take 10 mg by mouth 3 (three) times daily.    . cloNIDine (CATAPRES) 0.1 MG tablet Take 2 tablets (0.2 mg total) by mouth 2 (two) times daily. (Patient taking differently: Take 0.2 mg by mouth 3 (three) times daily. ) 60 tablet 1  . cyclobenzaprine (FLEXERIL) 10 MG tablet Take 10 mg by mouth 3 (three) times daily as needed for muscle spasms.    Marland Kitchen HYDROcodone-acetaminophen (NORCO/VICODIN) 5-325 MG tablet Take 2 tablets by mouth  every 4 (four) hours as needed. 6 tablet 0  . HYDROcodone-acetaminophen (NORCO/VICODIN) 5-325 MG tablet Take 1 tablet by mouth every 4 (four) hours as needed. 10 tablet 0  . mometasone-formoterol (DULERA) 100-5 MCG/ACT AERO Inhale 2 puffs into the lungs 2 (two) times daily.    . ondansetron (ZOFRAN ODT) 4 MG disintegrating tablet Take 1 tablet (4 mg total) by mouth every 8 (eight) hours as needed for nausea or vomiting. 20 tablet 0  . ondansetron (ZOFRAN) 4 MG tablet Take 4 mg by mouth every 6 (six) hours as needed.    . pantoprazole (PROTONIX) 40 MG tablet Take 1 tablet (40 mg total) by mouth daily. 30 tablet 5   No current facility-administered medications for this visit.    Allergies as of 10/12/2019 - Review  Complete 10/12/2019  Allergen Reaction Noted  . Codeine Itching and Nausea And Vomiting   . Latex Other (See Comments) 03/09/2012  . Lisinopril  05/27/2017  . Morphine Hives   . Adhesive [tape] Rash 03/08/2012    Family History  Problem Relation Age of Onset  . Stomach cancer Father   . Lung cancer Father   . Throat cancer Father   . Cancer Father        lung and throat cancer  . Hypertension Father   . Liver disease Cousin        cirrhosis, etoh  . Throat cancer Paternal Uncle   . Lung cancer Paternal Uncle   . Colon cancer Cousin 47  . Heart disease Mother   . Hypertension Mother   . Colon cancer Maternal Uncle   . Throat cancer Paternal Uncle   . Lung cancer Paternal Uncle   . Throat cancer Paternal Uncle   . Lung cancer Paternal Uncle     Social History   Socioeconomic History  . Marital status: Single    Spouse name: Not on file  . Number of children: 0  . Years of education: Not on file  . Highest education level: Not on file  Occupational History  . Not on file  Tobacco Use  . Smoking status: Current Every Day Smoker    Packs/day: 1.00    Years: 37.00    Pack years: 37.00    Types: Cigarettes  . Smokeless tobacco: Former Network engineer and Sexual Activity  . Alcohol use: No    Comment: former user quit in 2014. previous weekend drinker  . Drug use: Yes    Frequency: 7.0 times per week    Types: Marijuana    Comment: once daily  . Sexual activity: Not on file  Other Topics Concern  . Not on file  Social History Narrative  . Not on file   Social Determinants of Health   Financial Resource Strain:   . Difficulty of Paying Living Expenses: Not on file  Food Insecurity:   . Worried About Charity fundraiser in the Last Year: Not on file  . Ran Out of Food in the Last Year: Not on file  Transportation Needs:   . Lack of Transportation (Medical): Not on file  . Lack of Transportation (Non-Medical): Not on file  Physical Activity:   . Days of  Exercise per Week: Not on file  . Minutes of Exercise per Session: Not on file  Stress:   . Feeling of Stress : Not on file  Social Connections:   . Frequency of Communication with Friends and Family: Not on file  . Frequency of Social Gatherings  with Friends and Family: Not on file  . Attends Religious Services: Not on file  . Active Member of Clubs or Organizations: Not on file  . Attends Archivist Meetings: Not on file  . Marital Status: Not on file    Review of Systems: General: Negative for anorexia, weight loss, fever, chills, fatigue, weakness. ENT: Negative for hoarseness, difficulty swallowing. CV: Negative for chest pain, angina, palpitations, peripheral edema.  Respiratory: Negative for dyspnea at rest, cough, sputum, wheezing.  GI: See history of present illness. Endo: Negative for unusual weight change.  Heme: Negative for bruising or bleeding. Allergy: Negative for rash or hives.   Physical Exam: BP 116/86   Pulse 71   Temp (!) 97.3 F (36.3 C) (Oral)   Ht 5\' 7"  (1.702 m)   Wt 168 lb 9.6 oz (76.5 kg)   BMI 26.41 kg/m  General:   Alert and oriented. Pleasant and cooperative. Well-nourished and well-developed. Appears in pain. Eyes:  Without icterus, sclera clear and conjunctiva pink.  Ears:  Normal auditory acuity. Cardiovascular:  S1, S2 present without murmurs appreciated. Extremities without clubbing or edema. Respiratory:  Clear to auscultation bilaterally. No wheezes, rales, or rhonchi. No distress.  Gastrointestinal:  +BS, soft, and non-distended. Significant generalized abdominal TTP. No HSM noted. No guarding or rebound. No masses appreciated.  Rectal:  Deferred  Musculoskalatal:  Symmetrical without gross deformities. Neurologic:  Alert and oriented x4;  grossly normal neurologically. Psych:  Alert and cooperative. Normal mood and affect. Heme/Lymph/Immune: No excessive bruising noted.    10/12/2019 8:51 AM   Disclaimer: This note was  dictated with voice recognition software. Similar sounding words can inadvertently be transcribed and may not be corrected upon review.

## 2019-10-12 NOTE — Assessment & Plan Note (Signed)
The patient initially described black diarrhea.  On further discuss it seems more like black tarry stools which could be related to upper GI bleed.  Plans for EGD tomorrow, as per above.  I have asked him to call us in 1 to 2 weeks after his EGD if he is still having diarrhea and so we can check stool studies.  Return for follow-up in 3 months.

## 2019-10-12 NOTE — Addendum Note (Signed)
Addended by: Gordy Levan, Leyli Kevorkian A on: 10/12/2019 09:47 AM   Modules accepted: Orders

## 2019-10-12 NOTE — Progress Notes (Signed)
Cc'ed to pcp °

## 2019-10-13 ENCOUNTER — Encounter (HOSPITAL_COMMUNITY): Payer: Self-pay | Admitting: Internal Medicine

## 2019-10-13 ENCOUNTER — Ambulatory Visit (HOSPITAL_COMMUNITY): Payer: Self-pay | Admitting: Anesthesiology

## 2019-10-13 ENCOUNTER — Encounter (HOSPITAL_COMMUNITY)
Admission: RE | Disposition: A | Discharge status: Home / Self Care | Payer: Self-pay | Source: Home / Self Care | Attending: Internal Medicine

## 2019-10-13 ENCOUNTER — Ambulatory Visit (HOSPITAL_COMMUNITY)
Admission: RE | Admit: 2019-10-13 | Discharge: 2019-10-13 | Disposition: A | Discharge status: Home / Self Care | Payer: Self-pay | Attending: Internal Medicine | Admitting: Internal Medicine

## 2019-10-13 DIAGNOSIS — Z9049 Acquired absence of other specified parts of digestive tract: Secondary | ICD-10-CM | POA: Insufficient documentation

## 2019-10-13 DIAGNOSIS — R1310 Dysphagia, unspecified: Secondary | ICD-10-CM | POA: Insufficient documentation

## 2019-10-13 DIAGNOSIS — F419 Anxiety disorder, unspecified: Secondary | ICD-10-CM | POA: Insufficient documentation

## 2019-10-13 DIAGNOSIS — K228 Other specified diseases of esophagus: Secondary | ICD-10-CM | POA: Insufficient documentation

## 2019-10-13 DIAGNOSIS — Z85038 Personal history of other malignant neoplasm of large intestine: Secondary | ICD-10-CM | POA: Insufficient documentation

## 2019-10-13 DIAGNOSIS — Z91040 Latex allergy status: Secondary | ICD-10-CM | POA: Insufficient documentation

## 2019-10-13 DIAGNOSIS — J449 Chronic obstructive pulmonary disease, unspecified: Secondary | ICD-10-CM | POA: Insufficient documentation

## 2019-10-13 DIAGNOSIS — Z8719 Personal history of other diseases of the digestive system: Secondary | ICD-10-CM | POA: Insufficient documentation

## 2019-10-13 DIAGNOSIS — Z8249 Family history of ischemic heart disease and other diseases of the circulatory system: Secondary | ICD-10-CM | POA: Insufficient documentation

## 2019-10-13 DIAGNOSIS — Z885 Allergy status to narcotic agent status: Secondary | ICD-10-CM | POA: Insufficient documentation

## 2019-10-13 DIAGNOSIS — F17210 Nicotine dependence, cigarettes, uncomplicated: Secondary | ICD-10-CM | POA: Insufficient documentation

## 2019-10-13 DIAGNOSIS — R12 Heartburn: Secondary | ICD-10-CM

## 2019-10-13 DIAGNOSIS — Z79899 Other long term (current) drug therapy: Secondary | ICD-10-CM | POA: Insufficient documentation

## 2019-10-13 DIAGNOSIS — R933 Abnormal findings on diagnostic imaging of other parts of digestive tract: Secondary | ICD-10-CM | POA: Insufficient documentation

## 2019-10-13 DIAGNOSIS — I10 Essential (primary) hypertension: Secondary | ICD-10-CM | POA: Insufficient documentation

## 2019-10-13 DIAGNOSIS — Z86010 Personal history of colonic polyps: Secondary | ICD-10-CM | POA: Insufficient documentation

## 2019-10-13 DIAGNOSIS — Z7951 Long term (current) use of inhaled steroids: Secondary | ICD-10-CM | POA: Insufficient documentation

## 2019-10-13 DIAGNOSIS — G4730 Sleep apnea, unspecified: Secondary | ICD-10-CM | POA: Insufficient documentation

## 2019-10-13 DIAGNOSIS — K219 Gastro-esophageal reflux disease without esophagitis: Secondary | ICD-10-CM | POA: Insufficient documentation

## 2019-10-13 DIAGNOSIS — M1990 Unspecified osteoarthritis, unspecified site: Secondary | ICD-10-CM | POA: Insufficient documentation

## 2019-10-13 HISTORY — PX: ESOPHAGOGASTRODUODENOSCOPY (EGD) WITH PROPOFOL: SHX5813

## 2019-10-13 HISTORY — PX: BIOPSY: SHX5522

## 2019-10-13 SURGERY — ESOPHAGOGASTRODUODENOSCOPY (EGD) WITH PROPOFOL
Anesthesia: General

## 2019-10-13 MED ORDER — GLYCOPYRROLATE 0.2 MG/ML IJ SOLN
INTRAMUSCULAR | Status: DC | PRN
  Administered 2019-10-13: .1 mg via INTRAVENOUS

## 2019-10-13 MED ORDER — CHLORHEXIDINE GLUCONATE CLOTH 2 % EX PADS: 6.0000 | MEDICATED_PAD | Freq: Once | CUTANEOUS | Status: DC

## 2019-10-13 MED ORDER — GLYCOPYRROLATE PF 0.2 MG/ML IJ SOSY
PREFILLED_SYRINGE | INTRAMUSCULAR | Status: AC
  Filled 2019-10-13: qty 5

## 2019-10-13 MED ORDER — KETAMINE HCL 50 MG/5ML IJ SOSY
PREFILLED_SYRINGE | INTRAMUSCULAR | Status: AC
  Filled 2019-10-13: qty 5

## 2019-10-13 MED ORDER — EPHEDRINE 5 MG/ML INJ
INTRAVENOUS | Status: AC
  Filled 2019-10-13: qty 40

## 2019-10-13 MED ORDER — PROPOFOL 500 MG/50ML IV EMUL
INTRAVENOUS | Status: DC | PRN
  Administered 2019-10-13: 125 ug/kg/min via INTRAVENOUS

## 2019-10-13 MED ORDER — PROPOFOL 10 MG/ML IV BOLUS
INTRAVENOUS | Status: DC | PRN
  Administered 2019-10-13: 20 mg via INTRAVENOUS

## 2019-10-13 MED ORDER — KETAMINE HCL 10 MG/ML IJ SOLN
INTRAMUSCULAR | Status: DC | PRN
  Administered 2019-10-13: 30 mg via INTRAVENOUS

## 2019-10-13 MED ORDER — LACTATED RINGERS IV SOLN: INTRAVENOUS | Status: DC | PRN

## 2019-10-13 MED ORDER — LIDOCAINE HCL (CARDIAC) PF 100 MG/5ML IV SOSY
PREFILLED_SYRINGE | INTRAVENOUS | Status: DC | PRN
  Administered 2019-10-13: 50 mg via INTRATRACHEAL

## 2019-10-13 MED ORDER — PROPOFOL 10 MG/ML IV BOLUS
INTRAVENOUS | Status: AC
  Filled 2019-10-13: qty 20

## 2019-10-13 MED ORDER — LIDOCAINE 2% (20 MG/ML) 5 ML SYRINGE
INTRAMUSCULAR | Status: AC
  Filled 2019-10-13: qty 15

## 2019-10-13 NOTE — Op Note (Signed)
[facility] Patient Name: [name] Procedure Date: 10/13/2019 10:27 AM MRN: [id number] Date of Birth: May 02, 1966 Attending MD: Norvel Richards , MD CSN: [id number] Age: 54 Admit Type: Outpatient Procedure:                Upper GI endoscopy Indications:              Heartburn, Abnormal CT of the GI tract; denies                            dysphagia Providers:                Norvel Richards, MD, Otis Peak B. Sharon Seller, RN,                            Aram Candela Referring MD:              Medicines:                Propofol per Anesthesia Complications:            No immediate complications. Estimated Blood Loss:     Estimated blood loss was minimal. Procedure:                Pre-Anesthesia Assessment:                           - Prior to the procedure, a History and Physical                            was performed, and patient medications and                            allergies were reviewed. The patient's tolerance of                            previous anesthesia was also reviewed. The risks                            and benefits of the procedure and the sedation                            options and risks were discussed with the patient.                            All questions were answered, and informed consent                            was obtained. Prior Anticoagulants: The patient has                            taken no previous anticoagulant or antiplatelet                            agents. ASA Grade Assessment: II - A patient with  mild systemic disease. After reviewing the risks                            and benefits, the patient was deemed in                            satisfactory condition to undergo the procedure.                           After obtaining informed consent, the endoscope was                            passed under direct vision. Throughout the                            procedure, the patient's blood  pressure, pulse, and                            oxygen saturations were monitored continuously. The                            [id number] scope was introduced through the                            mouth, and advanced to the second part of duodenum.                            The upper GI endoscopy was accomplished without                            difficulty. The patient tolerated the procedure                            well. Scope In: 10:53:14 AM Scope Out: 11:02:08 AM Total Procedure Duration: 0 hours 8 minutes 54 seconds  Findings:      The examined esophagus was normal aside from an accentuated undulating       Z-line versus short segment Barrett's esophagus. No esophagitis. No       tumor. Tubular esophagus patent throughout its course.      The entire examined stomach was normal.      The duodenal bulb and second portion of the duodenum were normal.       Finally, the distal esophageal mucosa was biopsied with a cold forceps       for histology. Estimated blood loss was minimal. Impression:               -Abnormal distal esophagus may be normal                            variant?"status post biopsy                           - Normal stomach.                           - Normal  duodenal bulb and second portion of the                            duodenum. Moderate Sedation:      Moderate (conscious) sedation was personally administered by an       anesthesia professional. The following parameters were monitored: oxygen       saturation, heart rate, blood pressure, respiratory rate, EKG, adequacy       of pulmonary ventilation, and response to care. Recommendation:           - Patient has a contact number available for                            emergencies. The signs and symptoms of potential                            delayed complications were discussed with the                            patient. Return to normal activities tomorrow.                            Written  discharge instructions were provided to the                            patient.                           - Advance diet as tolerated. Protonix 40 mg twice                            daily. Prescription provided. Carafate 1 g slurry 4                            times daily x5 days                           - Continue present medications. Low up on                            pathology. Office visit with [place] in 6 weeks. Procedure Code(s):        --- Professional ---                           [phone], Esophagogastroduodenoscopy, flexible,                            transoral; with biopsy, single or multiple Diagnosis Code(s):        --- Professional ---                           R12, Heartburn                           R93.3, Abnormal findings on diagnostic imaging of  other parts of digestive tract CPT copyright 2019 [employer]. All rights reserved. The codes documented in this report are preliminary and upon coder review may  be revised to meet current compliance requirements. [name]. Yaa Donnellan, MD Norvel Richards, MD 10/13/2019 11:11:35 AM This report has been signed electronically. Number of Addenda: 0

## 2019-10-13 NOTE — Transfer of Care (Signed)
Immediate Anesthesia Transfer of Care Note  Patient: JESEE DOUP  Procedure(s) Performed: ESOPHAGOGASTRODUODENOSCOPY (EGD) WITH PROPOFOL (N/A ) BIOPSY  Patient Location: PACU  Anesthesia Type:General  Level of Consciousness: awake  Airway & Oxygen Therapy: Patient Spontanous Breathing  Post-op Assessment: Report given to RN and Post -op Vital signs reviewed and stable  Post vital signs: Reviewed and stable  Last Vitals:  Vitals Value Taken Time  BP    Temp    Pulse 66 10/13/19 1109  Resp    SpO2 100 % 10/13/19 1109  Vitals shown include unvalidated device data.  Last Pain:  Vitals:   10/13/19 1048  TempSrc:   PainSc: 5       Patients Stated Pain Goal: 8 ([id number])  Complications: No apparent anesthesia complications

## 2019-10-13 NOTE — Discharge Instructions (Signed)
Gastroesophageal Reflux Disease, Adult Gastroesophageal reflux (GER) happens when acid from the stomach flows up into the tube that connects the mouth and the stomach (esophagus). Normally, food travels down the esophagus and stays in the stomach to be digested. However, when a person has GER, food and stomach acid sometimes move back up into the esophagus. If this becomes a more serious problem, the person may be diagnosed with a disease called gastroesophageal reflux disease (GERD). GERD occurs when the reflux: Happens often. Causes frequent or severe symptoms. Causes problems such as damage to the esophagus. When stomach acid comes in contact with the esophagus, the acid may cause soreness (inflammation) in the esophagus. Over time, GERD may create small holes (ulcers) in the lining of the esophagus. What are the causes? This condition is caused by a problem with the muscle between the esophagus and the stomach (lower esophageal sphincter, or LES). Normally, the LES muscle closes after food passes through the esophagus to the stomach. When the LES is weakened or abnormal, it does not close properly, and that allows food and stomach acid to go back up into the esophagus. The LES can be weakened by certain dietary substances, medicines, and medical conditions, including: Tobacco use. Pregnancy. Having a hiatal hernia. Alcohol use. Certain foods and beverages, such as coffee, chocolate, onions, and peppermint. What increases the risk? You are more likely to develop this condition if you: Have an increased body weight. Have a connective tissue disorder. Use NSAID medicines. What are the signs or symptoms? Symptoms of this condition include: Heartburn. Difficult or painful swallowing. The feeling of having a lump in the throat. Abitter taste in the mouth. Bad breath. Having a large amount of saliva. Having an upset or bloated stomach. Belching. Chest pain. Different conditions can cause  chest pain. Make sure you see your health care provider if you experience chest pain. Shortness of breath or wheezing. Ongoing (chronic) cough or a night-time cough. Wearing away of tooth enamel. Weight loss. How is this diagnosed? Your health care provider will take a medical history and perform a physical exam. To determine if you have mild or severe GERD, your health care provider may also monitor how you respond to treatment. You may also have tests, including: A test to examine your stomach and esophagus with a small camera (endoscopy). A test thatmeasures the acidity level in your esophagus. A test thatmeasures how much pressure is on your esophagus. A barium swallow or modified barium swallow test to show the shape, size, and functioning of your esophagus. How is this treated? The goal of treatment is to help relieve your symptoms and to prevent complications. Treatment for this condition may vary depending on how severe your symptoms are. Your health care provider may recommend: Changes to your diet. Medicine. Surgery. Follow these instructions at home: Eating and drinking  Follow a diet as recommended by your health care provider. This may involve avoiding foods and drinks such as: Coffee and tea (with or without caffeine). Drinks that containalcohol. Energy drinks and sports drinks. Carbonated drinks or sodas. Chocolate and cocoa. Peppermint and mint flavorings. Garlic and onions. Horseradish. Spicy and acidic foods, including peppers, chili powder, curry powder, vinegar, hot sauces, and barbecue sauce. Citrus fruit juices and citrus fruits, such as oranges, lemons, and limes. Tomato-based foods, such as red sauce, chili, salsa, and pizza with red sauce. Fried and fatty foods, such as donuts, french fries, potato chips, and high-fat dressings. High-fat meats, such as hot dogs and fatty  cuts of red and white meats, such as rib eye steak, sausage, ham, and bacon. High-fat  dairy items, such as whole milk, butter, and cream cheese. Eat small, frequent meals instead of large meals. Avoid drinking large amounts of liquid with your meals. Avoid eating meals during the 2-3 hours before bedtime. Avoid lying down right after you eat. Do not exercise right after you eat. Lifestyle  Do not use any products that contain nicotine or tobacco, such as cigarettes, e-cigarettes, and chewing tobacco. If you need help quitting, ask your health care provider. Try to reduce your stress by using methods such as yoga or meditation. If you need help reducing stress, ask your health care provider. If you are overweight, reduce your weight to an amount that is healthy for you. Ask your health care provider for guidance about a safe weight loss goal. General instructions Pay attention to any changes in your symptoms. Take over-the-counter and prescription medicines only as told by your health care provider. Do not take aspirin, ibuprofen, or other NSAIDs unless your health care provider told you to do so. Wear loose-fitting clothing. Do not wear anything tight around your waist that causes pressure on your abdomen. Raise (elevate) the head of your bed about 6 inches (15 cm). Avoid bending over if this makes your symptoms worse. Keep all follow-up visits as told by your health care provider. This is important. Contact a health care provider if: You have: New symptoms. Unexplained weight loss. Difficulty swallowing or it hurts to swallow. Wheezing or a persistent cough. A hoarse voice. Your symptoms do not improve with treatment. Get help right away if you: Have pain in your arms, neck, jaw, teeth, or back. Feel sweaty, dizzy, or light-headed. Have chest pain or shortness of breath. Vomit and your vomit looks like blood or coffee grounds. Faint. Have stool that is bloody or black. Cannot swallow, drink, or eat. Summary Gastroesophageal reflux happens when acid from the stomach  flows up into the esophagus. GERD is a disease in which the reflux happens often, causes frequent or severe symptoms, or causes problems such as damage to the esophagus. Treatment for this condition may vary depending on how severe your symptoms are. Your health care provider may recommend diet and lifestyle changes, medicine, or surgery. Contact a health care provider if you have new or worsening symptoms. Take over-the-counter and prescription medicines only as told by your health care provider. Do not take aspirin, ibuprofen, or other NSAIDs unless your health care provider told you to do so. Keep all follow-up visits as told by your health care provider. This is important. This information is not intended to replace advice given to you by your health care provider. Make sure you discuss any questions you have with your health care provider. Document Revised: 03/03/2018 Document Reviewed: 03/03/2018 Elsevier Patient Education  [place]. EGD Discharge instructions Please read the instructions outlined below and refer to this sheet in the next few weeks. These discharge instructions provide you with general information on caring for yourself after you leave the hospital. Your doctor may also give you specific instructions. While your treatment has been planned according to the most current medical practices available, unavoidable complications occasionally occur. If you have any problems or questions after discharge, please call your doctor. ACTIVITY  You may resume your regular activity but move at a slower pace for the next 24 hours.   Take frequent rest periods for the next 24 hours.   Walking will  help expel (get rid of) the air and reduce the bloated feeling in your abdomen.   No driving for 24 hours (because of the anesthesia (medicine) used during the test).   You may shower.   Do not sign any important legal documents or operate any machinery for 24 hours (because of the  anesthesia used during the test).  NUTRITION  Drink plenty of fluids.   You may resume your normal diet.   Begin with a light meal and progress to your normal diet.   Avoid alcoholic beverages for 24 hours or as instructed by your caregiver.  MEDICATIONS  You may resume your normal medications unless your caregiver tells you otherwise.  WHAT YOU CAN EXPECT TODAY  You may experience abdominal discomfort such as a feeling of fullness or "gas" pains.  FOLLOW-UP  Your doctor will discuss the results of your test with you.  SEEK IMMEDIATE MEDICAL ATTENTION IF ANY OF THE FOLLOWING OCCUR:  Excessive nausea (feeling sick to your stomach) and/or vomiting.   Severe abdominal pain and distention (swelling).   Trouble swallowing.   Temperature over 101 F (37.8 C).   Rectal bleeding or vomiting of blood.   GERD information provided  Protonix 40 mg twice daily-prescription provided  Carafate slurry's 1 g 4 times daily x5 days-prescription provided  Further recommendations to follow pending review of pathology report  Office visit with [place] in 6 weeks  At patient request I called [employer] at [phone] and reviewed results.

## 2019-10-13 NOTE — Anesthesia Postprocedure Evaluation (Signed)
Anesthesia Post Note  Patient: BRAUN BARNO  Procedure(s) Performed: ESOPHAGOGASTRODUODENOSCOPY (EGD) WITH PROPOFOL (N/A ) BIOPSY  Patient location during evaluation: PACU Anesthesia Type: General Level of consciousness: awake and alert Pain management: pain level controlled Vital Signs Assessment: post-procedure vital signs reviewed and stable Respiratory status: spontaneous breathing Cardiovascular status: stable Postop Assessment: no apparent nausea or vomiting Anesthetic complications: no     Last Vitals:  Vitals:   10/13/19 0849  BP: 133/89  Pulse: 60  Resp: (!) 22  Temp: 37 C  SpO2: 99%    Last Pain:  Vitals:   10/13/19 1048  TempSrc:   PainSc: 5                  Farhad Burleson Hristova

## 2019-10-13 NOTE — Anesthesia Preprocedure Evaluation (Signed)
Anesthesia Evaluation  Patient identified by MRN, date of birth, ID band Patient awake    Reviewed: Allergy & Precautions, NPO status , Patient's Chart, lab work & pertinent test results  Airway Mallampati: I  TM Distance: >3 FB Neck ROM: Full    Dental no notable dental hx. (+) Edentulous Upper, Edentulous Lower   Pulmonary shortness of breath and with exertion, sleep apnea , COPD,  COPD inhaler, Current SmokerPatient did not abstain from smoking.,    Pulmonary exam normal breath sounds clear to auscultation       Cardiovascular Exercise Tolerance: Good hypertension, Pt. on medications negative cardio ROS Normal cardiovascular examI Rhythm:Regular Rate:Normal  Reports can walk a half mile  States limited by knee and COPD/smoking +DOE Denies CP or known cardiac issues    Neuro/Psych  Headaches, Anxiety Depression negative psych ROS   GI/Hepatic Neg liver ROS, hiatal hernia, GERD  Medicated and Controlled,Long GI history - here for EGD   Endo/Other  negative endocrine ROS  Renal/GU Renal diseaseHad AKI in past  CrCL now Normal  Denies HD in the past  negative genitourinary   Musculoskeletal  (+) Arthritis , Osteoarthritis,    Abdominal   Peds negative pediatric ROS (+)  Hematology negative hematology ROS (+)   Anesthesia Other Findings   Reproductive/Obstetrics negative OB ROS                             Anesthesia Physical Anesthesia Plan  ASA: III  Anesthesia Plan: General   Post-op Pain Management:    Induction: Intravenous  PONV Risk Score and Plan: 1 and TIVA, Propofol infusion and Treatment may vary due to age or medical condition  Airway Management Planned: Nasal Cannula and Simple Face Mask  Additional Equipment:   Intra-op Plan:   Post-operative Plan:   Informed Consent: I have reviewed the patients History and Physical, chart, labs and discussed the procedure  including the risks, benefits and alternatives for the proposed anesthesia with the patient or authorized representative who has indicated his/her understanding and acceptance.     Dental advisory given  Plan Discussed with: CRNA  Anesthesia Plan Comments: (Plan Full PPE use  Plan GA with GETA as needed d/w pt -WTP with same after Q&A)        Anesthesia Quick Evaluation

## 2019-10-13 NOTE — Interval H&P Note (Signed)
History and Physical Interval Note:  10/13/2019 10:38 AM  [name]  has presented today for surgery, with the diagnosis of melena, esophagitis.  The various methods of treatment have been discussed with the patient and family. After consideration of risks, benefits and other options for treatment, the patient has consented to  Procedure(s) with comments: ESOPHAGOGASTRODUODENOSCOPY (EGD) WITH PROPOFOL (N/A) - 9:45am as a surgical intervention.  The patient's history has been reviewed, patient examined, no change in status, stable for surgery.  I have reviewed the patient's chart and labs.  Questions were answered to the patient's satisfaction.     [name]   No change. Patient denies any f dysphagia symptoms whatsoever. Diagnostic EGD today.  The risks, benefits, limitations, alternatives and imponderables have been reviewed with the patient. Potential for esophageal dilation, biopsy, etc. have also been reviewed.  Questions have been answered. All parties agreeable.

## 2019-10-14 ENCOUNTER — Encounter: Payer: Self-pay | Admitting: Internal Medicine

## 2019-10-14 LAB — SURGICAL PATHOLOGY

## 2019-10-25 ENCOUNTER — Ambulatory Visit: Payer: Self-pay | Admitting: Nurse Practitioner

## 2019-11-22 ENCOUNTER — Ambulatory Visit: Payer: Self-pay | Admitting: Nurse Practitioner

## 2019-11-24 ENCOUNTER — Ambulatory Visit: Payer: Self-pay | Admitting: Nurse Practitioner

## 2019-11-28 ENCOUNTER — Ambulatory Visit: Payer: Self-pay | Admitting: Physician Assistant

## 2019-12-06 ENCOUNTER — Other Ambulatory Visit: Payer: Self-pay

## 2019-12-06 ENCOUNTER — Encounter: Payer: Self-pay | Admitting: Nurse Practitioner

## 2019-12-06 ENCOUNTER — Ambulatory Visit (INDEPENDENT_AMBULATORY_CARE_PROVIDER_SITE_OTHER): Payer: Self-pay | Admitting: Nurse Practitioner

## 2019-12-06 VITALS — BP 152/98 | HR 96 | Temp 97.5°F | Ht 66.0 in | Wt 181.2 lb

## 2019-12-06 DIAGNOSIS — R109 Unspecified abdominal pain: Secondary | ICD-10-CM | POA: Insufficient documentation

## 2019-12-06 DIAGNOSIS — K649 Unspecified hemorrhoids: Secondary | ICD-10-CM | POA: Insufficient documentation

## 2019-12-06 DIAGNOSIS — R1030 Lower abdominal pain, unspecified: Secondary | ICD-10-CM

## 2019-12-06 DIAGNOSIS — K648 Other hemorrhoids: Secondary | ICD-10-CM

## 2019-12-06 DIAGNOSIS — K219 Gastro-esophageal reflux disease without esophagitis: Secondary | ICD-10-CM

## 2019-12-06 DIAGNOSIS — R197 Diarrhea, unspecified: Secondary | ICD-10-CM

## 2019-12-06 MED ORDER — DICYCLOMINE HCL 10 MG PO CAPS: 10.0000 mg | ORAL_CAPSULE | Freq: Three times a day (TID) | ORAL | 3 refills | Status: DC | PRN

## 2019-12-06 NOTE — Assessment & Plan Note (Signed)
Diarrhea that started about 1 month ago and occurs about once a week.  Associated abdominal pain that resolves after a bowel movement.  Query element of IBS.  Less likely colonic infection such as C. difficile.  No recent travel, sick contacts, dietary changes.  I will have him start a probiotic and Bentyl as per below.  Follow-up for hemorrhoid banding.

## 2019-12-06 NOTE — Patient Instructions (Signed)
Your health issues we discussed today were:   New diarrhea with associated abdominal discomfort: 1. At this point I do not think you have a colon infection.  However, if your symptoms are progressive or worse we can do stool testing 2. In the interim I have sent in a prescription for Bentyl 10 mg to your pharmacy.  You can take this up to 3 times a day as needed for abdominal pain or diarrhea 3. Start taking an over-the-counter probiotic.  Brands it we have had success with include align, [employer], [employer], [place]. 4. Call us if you have any worsening or severe symptoms  Hemorrhoids with rectal bleeding: 1. We will schedule you for a follow-up with Roseanne Kaufman, NP who will perform your in office hemorrhoid banding. 2. This procedure is completed in the office with the use of a device that is mildly inserted into your rectum and applies bands to your hemorrhoids to help eradicate them (make them go away) 3. It is a low risk, generally painless procedure 4. You have any questions let us know  GERD (reflux/heartburn): 1. Continue taking your current acid blockers 2. Let us know if you have any worsening or severe symptoms.  Overall I recommend:  1. Continue your other current medications 2. Return for follow-up for your hemorrhoid banding appointment 3. Follow-up as needed for any worsening diarrhea or abdominal pain   ---------------------------------------------------------------  COVID-19 Vaccine Information can be found at: [email or web address].[email or web address] For questions related to vaccine distribution or appointments, please email [email or web address] or call [phone].   ---------------------------------------------------------------   At [facility] we value your feedback. You may receive a survey about your visit today. Please share your experience as we strive to create trusting relationships with our  patients to provide genuine, compassionate, quality care.  We appreciate your understanding and patience as we review any laboratory studies, imaging, and other diagnostic tests that are ordered as we care for you. Our office policy is 5 business days for review of these results, and any emergent or urgent results are addressed in a timely manner for your best interest. If you do not hear from our office in 1 week, please contact us.   We also encourage the use of MyChart, which contains your medical information for your review as well. If you are not enrolled in this feature, an access code is on this after visit summary for your convenience. Thank you for allowing [place] to be involved in your care.  It was great to see you today!  I hope you have a great day!!

## 2019-12-06 NOTE — Assessment & Plan Note (Signed)
Noted internal hemorrhoids on colonoscopy in 2019.  He has had persistent symptoms despite topical therapy which is only minimally effective.  There was a previous note he may be a candidate for hemorrhoid banding.  I have discussed with the banding provider.  He is not on any antiplatelets or anticoagulants, Biologics.  At this point I will have him follow-up for hemorrhoid banding.  Continue topical therapy in the interim.

## 2019-12-06 NOTE — Assessment & Plan Note (Signed)
Well managed on PPI.  Continue current medications.

## 2019-12-06 NOTE — Assessment & Plan Note (Signed)
Noted chronic generalized/lower abdominal pain that is worse with palpation with multiple abdominal surgeries, hernia mesh.  This results in tenderness on exam.  However, with his new symptom of diarrhea that started about a month ago he has begun having associated abdominal pain that typically resolves after a bowel movement.  Query element of IBS.  He was recently in healthcare environment but I doubt infection at this time due to rare/intermittent loose stools occurring about once a week.  There is certainly consideration for stool testing if his symptoms are progressive or worsening.  At this point I will send in Bentyl 10 mg that he can take up to 3 times a day as needed.  I have advised him to start a probiotic.  Return for follow-up for hemorrhoid banding appointment.

## 2019-12-06 NOTE — Progress Notes (Signed)
Referring Provider: Soyla Dryer, PA-C Primary Care Physician:  Soyla Dryer, PA-C Primary GI:  Dr. Gala Romney  Chief Complaint  Patient presents with  . Gastroesophageal Reflux    f/u  . Diarrhea    "off/on"    HPI:   [name] is a 54 y.o. male who presents for follow-up on diarrhea.  The patient was last seen in our office 10/12/2019 for acute esophagitis, black stools, diarrhea.  At that time noted history of GERD generally doing well on omeprazole.  Colonoscopy up-to-date and next due in 2024.  Rectal fissure with anorectal burning and itching treated with Anusol compounded with lidocaine and nitroglycerin.  Previous improvement of symptoms with treatment.  He was at some point placed on Dexilant but lost medication assistance for this.  Also noted hematochezia with clot, regular rectal pain, 10-25 subjective weight loss.  He was treated with over-the-counter rectal cream, good Rx card for Protonix 40 mg daily, trigger avoidance, query candidacy for hemorrhoid banding in the future.  Just prior to his last visit he was in the ER for lower abdominal pain, poor hypertension control and CT scan negative for acute chest pathology.  At his last visit he was on Protonix until a week prior and noted esophageal burning, diarrhea, severe abdominal pain.  Stools turned black.  CT in the ER indicative of esophagitis with diffuse wall thickening of the thoracic esophagus.  Nausea managed with Zofran, no vomiting.  Cannot eat due to pain.  No other overt GI complaints.  Recommended Protonix twice a day, blood work, EGD, ER precautions given, follow-up in 3 months.  CBC completed 10/12/2019 found normal CBC.  EGD completed 10/13/2019 which found abnormal distal esophagus which may be a normal variant status post biopsy, normal stomach and duodenum.  Recommended continue PPI twice daily, Carafate 1 g slurry 4 times a day for 5 days.  Surgical pathology found the biopsies to be consistent with  reflux without intestinal metaplasia, dysplasia, or malignancy.  Today he states he's doing ok. GERD doing well. Has on/off diarrhea that occurs about once a week. Started a month ago. Denies recent travel, sick contacts, new foods. Does have abdominal pain with this and typically resolves after a bowel movement. Has bleed ign with almost every bowel movement with known internal hemorrhoids, noted persistent rectal pain/irritation. Uses topical therapy which helps some, but not a lot. No constipation. Denies N/V, melena, fever, chills, unintentional weight loss. Denies URI or flu-like symptoms. Denies loss of sense of taste or smell.  Last tested for COVID-19 prior to EGD which was negative. Denies chest pain, dyspnea, dizziness, lightheadedness, syncope, near syncope. Denies any other upper or lower GI symptoms.  Past Medical History:  Diagnosis Date  . Acute pancreatitis   . Anxiety   . Arthritis   . Cancer ([place])    Adenoma in colon  . Cannabis abuse   . COPD (chronic obstructive pulmonary disease) ([place])   . Depression   . Diverticulitis of colon with perforation 08/2010  . Dyspnea   . GERD (gastroesophageal reflux disease)   . History of hiatal hernia   . Hypertension    taken off lisinopril by renal md 08/2011  . Renal insufficiency    follwed by Dr. Hinda Lenis  . Sleep apnea    does not use CPAP, cannot tolerate    Past Surgical History:  Procedure Laterality Date  . APPENDECTOMY    . BIOPSY  12/14/2017   Procedure: BIOPSY;  Surgeon: Gala Romney,  [name], MD;  [facility];  Service: Endoscopy;;  gastric   . BIOPSY  10/13/2019   Procedure: BIOPSY;  Surgeon: Daneil Dolin, MD;  [facility];  Service: Endoscopy;;  esophagus  . clamp left in colon after abd surgery    . COLON SURGERY  05/2011   [facility]. exp laparotomy with colostomy takedown, lysis of adhesions (3hours), loop ileostomy. complicated by abscess formation requiring percutaneous drainage  . colonoscopy  via stoma with endoscopy of [name]  04/2011   [facility]. two sessile polyps in trv colon. complete resection but only partial retrieval. congested mucosa in area 3cm proximal to stoma, erythematous mucosa in Hartmann pouch. repeat tcs 8/2017path-adenomatous polyp  . COLONOSCOPY WITH PROPOFOL N/A 12/14/2017   Dr. Gala Romney: 2 4 to 6 mm polyps in the cecum, tubular adenomas, single rectal polyp tubular adenoma, nonbleeding internal hemorrhoids.  Appears to be a surgical clip at the appendiceal orifice.  Next colonoscopy in 5 years  . ESOPHAGOGASTRODUODENOSCOPY  03/05/2012   erosive reflux esophagitis, bulbar erosions, gastric erosions/petichae, bx showed gastritis, no H.Pylori. Procedure: ESOPHAGOGASTRODUODENOSCOPY (EGD);  Surgeon: Daneil Dolin, MD;  [facility];  Service: Endoscopy;  Laterality: N/A;  11:30  . ESOPHAGOGASTRODUODENOSCOPY (EGD) WITH PROPOFOL N/A 12/14/2017   Dr. Gala Romney: Erosive reflux esophagitis with questionable mild stricturing/non-obstruction at this level status post dilation.  multiple gastric erosions in the stomach biopsy showed reactive gastropathy but no H. pylori.  . ESOPHAGOGASTRODUODENOSCOPY (EGD) WITH PROPOFOL N/A 10/13/2019   Procedure: ESOPHAGOGASTRODUODENOSCOPY (EGD) WITH PROPOFOL;  Surgeon: Daneil Dolin, MD;  [facility];  Service: Endoscopy;  Laterality: N/A;  9:45am  . HERNIA REPAIR    . KNEE SURGERY Right 2006  . MALONEY DILATION N/A 12/14/2017   Procedure: Venia Minks DILATION;  Surgeon: Daneil Dolin, MD;  [facility];  Service: Endoscopy;  Laterality: N/A;  . MULTIPLE EXTRACTIONS WITH ALVEOLOPLASTY  02/23/2012   Procedure: MULTIPLE EXTRACION WITH ALVEOLOPLASTY;  Surgeon: Gae Bon, DDS;  Location: [facility];  Service: Oral Surgery;  Laterality: Bilateral;  Removal of left mandibular torus, multiple extractions with alveoloplasty  . POLYPECTOMY  12/14/2017   Procedure: POLYPECTOMY;  Surgeon: Daneil Dolin, MD;  [facility];  Service: Endoscopy;;  . reversal of colostomy  10/2011   per patient, did not have report  . right knee surgery    . sigmoid colectomy with end colostomy, Hartmann's pouch  08/2010   [facility]. complicated diverticulitis  . UMBILICAL HERNIA REPAIR      Current Outpatient Medications  Medication Sig Dispense Refill  . albuterol (PROVENTIL HFA;VENTOLIN HFA) 108 (90 Base) MCG/ACT inhaler Inhale 2 puffs into the lungs every 6 (six) hours as needed for wheezing or shortness of breath.    . busPIRone (BUSPAR) 10 MG tablet Take 10 mg by mouth 3 (three) times daily.    . cloNIDine (CATAPRES) 0.1 MG tablet Take 2 tablets (0.2 mg total) by mouth 2 (two) times daily. (Patient taking differently: Take 0.2 mg by mouth 3 (three) times daily. ) 60 tablet 1  . cyclobenzaprine (FLEXERIL) 10 MG tablet Take 10 mg by mouth 3 (three) times daily as needed for muscle spasms.    Marland Kitchen HYDROcodone-acetaminophen (NORCO/VICODIN) 5-325 MG tablet Take 1-2 tablets by mouth every 4 (four) hours as needed for moderate pain or severe pain. 5 tablet 0  . mometasone-formoterol (DULERA) 100-5 MCG/ACT AERO Inhale 2 puffs into the lungs 2 (two) times daily.    Marland Kitchen  ondansetron (ZOFRAN ODT) 4 MG disintegrating tablet Take 1 tablet (4 mg total) by mouth every 8 (eight) hours as needed for nausea or vomiting. 20 tablet 0  . ondansetron (ZOFRAN) 4 MG tablet Take 4 mg by mouth every 6 (six) hours as needed.    . pantoprazole (PROTONIX) 40 MG tablet Take 1 tablet (40 mg total) by mouth daily. (Patient taking differently: Take 40 mg by mouth 2 (two) times daily. ) 30 tablet 5   No current facility-administered medications for this visit.    Allergies as of 12/06/2019 - Review Complete 12/06/2019  Allergen Reaction Noted  . Codeine Itching and Nausea And Vomiting   . Latex Other (See Comments) 03/09/2012  . Lisinopril  05/27/2017  . Morphine Hives   . Adhesive [tape] Rash 03/08/2012    Family History  Problem Relation Age of  Onset  . Stomach cancer Father   . Lung cancer Father   . Throat cancer Father   . Cancer Father        lung and throat cancer  . Hypertension Father   . Liver disease Cousin        cirrhosis, etoh  . Throat cancer Paternal Uncle   . Lung cancer Paternal Uncle   . Colon cancer Cousin 21  . Heart disease Mother   . Hypertension Mother   . Colon cancer Maternal Uncle   . Throat cancer Paternal Uncle   . Lung cancer Paternal Uncle   . Throat cancer Paternal Uncle   . Lung cancer Paternal Uncle     Social History   Socioeconomic History  . Marital status: Single    Spouse name: Not on file  . Number of children: 0  . Years of education: Not on file  . Highest education level: Not on file  Occupational History  . Not on file  Tobacco Use  . Smoking status: Current Every Day Smoker    Packs/day: 1.00    Years: 37.00    Pack years: 37.00    Types: Cigarettes  . Smokeless tobacco: Current User    Types: Snuff  . Tobacco comment: dips some trying to quit smoking  Substance and Sexual Activity  . Alcohol use: No    Comment: former user quit in 2014. previous weekend drinker  . Drug use: Yes    Frequency: 7.0 times per week    Types: Marijuana    Comment: once daily  . Sexual activity: Not on file  Other Topics Concern  . Not on file  Social History Narrative  . Not on file   Social Determinants of Health   Financial Resource Strain:   . Difficulty of Paying Living Expenses:   Food Insecurity:   . Worried About Charity fundraiser in the Last Year:   . Arboriculturist in the Last Year:   Transportation Needs:   . Film/video editor (Medical):   Marland Kitchen Lack of Transportation (Non-Medical):   Physical Activity:   . Days of Exercise per Week:   . Minutes of Exercise per Session:   Stress:   . Feeling of Stress :   Social Connections:   . Frequency of Communication with Friends and Family:   . Frequency of Social Gatherings with Friends and Family:   . Attends  Religious Services:   . Active Member of Clubs or Organizations:   . Attends Archivist Meetings:   Marland Kitchen Marital Status:     Review of Systems  Constitutional:  Negative for chills, fever, malaise/fatigue and weight loss.  HENT: Negative for sore throat.   Respiratory: Negative for cough and shortness of breath.   Cardiovascular: Negative for chest pain and palpitations.  Gastrointestinal: Positive for abdominal pain, blood in stool and diarrhea. Negative for melena, nausea and vomiting.  Musculoskeletal: Positive for joint pain. Negative for myalgias.       Chronic join pain, not new  Skin: Negative for rash.  Neurological: Negative for dizziness and weakness.  Endo/Heme/Allergies: Does not bruise/bleed easily.  Psychiatric/Behavioral: Negative for depression. The patient is not nervous/anxious.   All other systems reviewed and are negative.    Vital Signs: BP (!) 152/98   Pulse 96   Temp (!) 97.5 F (36.4 C) (Oral)   Ht 5\' 6"  (1.676 m)   Wt 181 lb 3.2 oz (82.2 kg)   BMI 29.25 kg/m  Physical Exam Vitals and nursing note reviewed.  Constitutional:      General: He is not in acute distress.    Appearance: Normal appearance. He is normal weight. He is not ill-appearing or toxic-appearing.  HENT:     Head: Normocephalic and atraumatic.     Nose: No congestion or rhinorrhea.  Eyes:     General: No scleral icterus. Cardiovascular:     Rate and Rhythm: Normal rate and regular rhythm.     Heart sounds: Normal heart sounds.  Pulmonary:     Effort: Pulmonary effort is normal.     Breath sounds: Normal breath sounds.  Abdominal:     General: Abdomen is flat. A surgical scar is present. Bowel sounds are normal. There is no distension.     Palpations: Abdomen is soft. There is no hepatomegaly, splenomegaly or mass.     Tenderness: There is abdominal tenderness in the left upper quadrant and left lower quadrant. There is guarding.     Hernia: No hernia is present.     Skin:    General: Skin is warm and dry.     Comments: Surgical scars noted to anterior mid-lower abdomen at site of tenderness  Neurological:     General: No focal deficit present.     Mental Status: He is alert and oriented to person, place, and time. Mental status is at baseline.  Psychiatric:        Mood and Affect: Mood normal.        Behavior: Behavior normal.        Thought Content: Thought content normal.       12/06/2019 8:25 AM   Disclaimer: This note was dictated with voice recognition software. Similar sounding words can inadvertently be transcribed and may not be corrected upon review.

## 2019-12-22 ENCOUNTER — Other Ambulatory Visit: Payer: Self-pay

## 2019-12-22 ENCOUNTER — Encounter: Payer: Self-pay | Admitting: Gastroenterology

## 2019-12-22 ENCOUNTER — Ambulatory Visit (INDEPENDENT_AMBULATORY_CARE_PROVIDER_SITE_OTHER): Payer: Self-pay | Admitting: Gastroenterology

## 2019-12-22 DIAGNOSIS — K602 Anal fissure, unspecified: Secondary | ICD-10-CM

## 2019-12-22 MED ORDER — HYDROCORTISONE (PERIANAL) 2.5 % EX CREA: 1.0000 "application " | TOPICAL_CREAM | Freq: Two times a day (BID) | CUTANEOUS | 1 refills | Status: DC

## 2019-12-22 NOTE — Patient Instructions (Signed)
I have to call [facility] to come up with a good option. They aren't open till 9.  We will see you back in 4 weeks and anticipate you will be ready for a banding then.   Limit toilet time to 2-3 minutes, avoid straining.  It was a pleasure to see you today. I want to create trusting relationships with patients to provide genuine, compassionate, and quality care. I value your feedback. If you receive a survey regarding your visit,  I greatly appreciate you taking time to fill this out.   Annitta Needs, PhD, ANP-BC [facility]

## 2019-12-22 NOTE — Progress Notes (Signed)
Referring Provider: Soyla Dryer, PA-C Primary Care Physician:  Soyla Dryer, PA-C  Primary GI: Dr. Gala Romney   Chief Complaint  Patient presents with  . Hemorrhoids    Here for banding. c/o bleeding,itching  . Abdominal Pain    improved from prior, across lower abd comes/goes    HPI:   [name] is a 54 y.o. male presenting today with a history of adenomas, internal hemorrhoids, concern for anal fissure, now here for evaluation of possible banding.   Denies constipation. Occasional diarrhea. Spending 5-10 minutes on toilet at a time. Feels tissue prolapsing at times but does not have to reduce. No fiber supplements. Notes constant anorectal pain, pain with BMs. Other symptoms including bleeding, itching, burning, leaking/soiling. He noted improvement with [place] hemorrhoid cream compounded with nitro but only completed 1/2 tube as he was unable to afford whole tube, stating this was near 100$. Discussed with [place], and they state patient spent 25$ for 1/2 tube and the whole tube is 45$. Patient agreeable to this price.   Past Medical History:  Diagnosis Date  . Acute pancreatitis   . Anxiety   . Arthritis   . Cancer ([place])    Adenoma in colon  . Cannabis abuse   . COPD (chronic obstructive pulmonary disease) ([place])   . Depression   . Diverticulitis of colon with perforation 08/2010  . Dyspnea   . GERD (gastroesophageal reflux disease)   . History of hiatal hernia   . Hypertension    taken off lisinopril by renal md 08/2011  . Renal insufficiency    follwed by Dr. Hinda Lenis  . Sleep apnea    does not use CPAP, cannot tolerate    Past Surgical History:  Procedure Laterality Date  . APPENDECTOMY    . BIOPSY  12/14/2017   Procedure: BIOPSY;  Surgeon: Daneil Dolin, MD;  [facility];  Service: Endoscopy;;  gastric   . BIOPSY  10/13/2019   Procedure: BIOPSY;  Surgeon: Daneil Dolin, MD;  [facility];  Service:  Endoscopy;;  esophagus  . clamp left in colon after abd surgery    . COLON SURGERY  05/2011   [facility]. exp laparotomy with colostomy takedown, lysis of adhesions (3hours), loop ileostomy. complicated by abscess formation requiring percutaneous drainage  . colonoscopy via stoma with endoscopy of [name]  04/2011   [facility]. two sessile polyps in trv colon. complete resection but only partial retrieval. congested mucosa in area 3cm proximal to stoma, erythematous mucosa in Hartmann pouch. repeat tcs 8/2017path-adenomatous polyp  . COLONOSCOPY WITH PROPOFOL N/A 12/14/2017   Dr. Gala Romney: 2 4 to 6 mm polyps in the cecum, tubular adenomas, single rectal polyp tubular adenoma, nonbleeding internal hemorrhoids.  Appears to be a surgical clip at the appendiceal orifice.  Next colonoscopy in 5 years  . ESOPHAGOGASTRODUODENOSCOPY  03/05/2012   erosive reflux esophagitis, bulbar erosions, gastric erosions/petichae, bx showed gastritis, no H.Pylori. Procedure: ESOPHAGOGASTRODUODENOSCOPY (EGD);  Surgeon: Daneil Dolin, MD;  [facility];  Service: Endoscopy;  Laterality: N/A;  11:30  . ESOPHAGOGASTRODUODENOSCOPY (EGD) WITH PROPOFOL N/A 12/14/2017   Dr. Gala Romney: Erosive reflux esophagitis with questionable mild stricturing/non-obstruction at this level status post dilation.  multiple gastric erosions in the stomach biopsy showed reactive gastropathy but no H. pylori.  . ESOPHAGOGASTRODUODENOSCOPY (EGD) WITH PROPOFOL N/A 10/13/2019   Procedure: ESOPHAGOGASTRODUODENOSCOPY (EGD) WITH PROPOFOL;  Surgeon: Daneil Dolin, MD;  [facility];  Service: Endoscopy;  Laterality: N/A;  9:45am  . HERNIA REPAIR    . KNEE SURGERY Right 2006  . MALONEY DILATION N/A 12/14/2017   Procedure: Venia Minks DILATION;  Surgeon: Daneil Dolin, MD;  [facility];  Service: Endoscopy;  Laterality: N/A;  . MULTIPLE EXTRACTIONS WITH ALVEOLOPLASTY  02/23/2012   Procedure: MULTIPLE EXTRACION WITH ALVEOLOPLASTY;  Surgeon:  Gae Bon, DDS;  Location: [place];  Service: Oral Surgery;  Laterality: Bilateral;  Removal of left mandibular torus, multiple extractions with alveoloplasty  . POLYPECTOMY  12/14/2017   Procedure: POLYPECTOMY;  Surgeon: Daneil Dolin, MD;  [facility];  Service: Endoscopy;;  . reversal of colostomy  10/2011   per patient, did not have report  . right knee surgery    . sigmoid colectomy with end colostomy, Hartmann's pouch  08/2010   [facility]. complicated diverticulitis  . UMBILICAL HERNIA REPAIR      Current Outpatient Medications  Medication Sig Dispense Refill  . albuterol (PROVENTIL HFA;VENTOLIN HFA) 108 (90 Base) MCG/ACT inhaler Inhale 2 puffs into the lungs every 6 (six) hours as needed for wheezing or shortness of breath.    . busPIRone (BUSPAR) 10 MG tablet Take 10 mg by mouth 3 (three) times daily.    . cloNIDine (CATAPRES) 0.1 MG tablet Take 2 tablets (0.2 mg total) by mouth 2 (two) times daily. (Patient taking differently: Take 0.2 mg by mouth 3 (three) times daily. ) 60 tablet 1  . cyclobenzaprine (FLEXERIL) 10 MG tablet Take 10 mg by mouth 3 (three) times daily as needed for muscle spasms.    Marland Kitchen dicyclomine (BENTYL) 10 MG capsule Take 1 capsule (10 mg total) by mouth 3 (three) times daily as needed for spasms (or diarrhea). 30 capsule 3  . HYDROcodone-acetaminophen (NORCO/VICODIN) 5-325 MG tablet Take 1-2 tablets by mouth every 4 (four) hours as needed for moderate pain or severe pain. 5 tablet 0  . mometasone-formoterol (DULERA) 100-5 MCG/ACT AERO Inhale 2 puffs into the lungs 2 (two) times daily.    . ondansetron (ZOFRAN ODT) 4 MG disintegrating tablet Take 1 tablet (4 mg total) by mouth every 8 (eight) hours as needed for nausea or vomiting. 20 tablet 0  . ondansetron (ZOFRAN) 4 MG tablet Take 4 mg by mouth every 6 (six) hours as needed.    . pantoprazole (PROTONIX) 40 MG tablet Take 1 tablet (40 mg total) by mouth daily. (Patient taking differently: Take 40 mg by  mouth 2 (two) times daily. ) 30 tablet 5   No current facility-administered medications for this visit.    Allergies as of 12/22/2019 - Review Complete 12/22/2019  Allergen Reaction Noted  . Codeine Itching and Nausea And Vomiting   . Latex Other (See Comments) 03/09/2012  . Lisinopril  05/27/2017  . Morphine Hives   . Adhesive [tape] Rash 03/08/2012    Family History  Problem Relation Age of Onset  . Stomach cancer Father   . Lung cancer Father   . Throat cancer Father   . Cancer Father        lung and throat cancer  . Hypertension Father   . Liver disease Cousin        cirrhosis, etoh  . Throat cancer Paternal Uncle   . Lung cancer Paternal Uncle   . Colon cancer Cousin 43  . Heart disease Mother   . Hypertension Mother   . Colon cancer Maternal Uncle   . Throat cancer Paternal Uncle   . Lung cancer Paternal Uncle   .  Throat cancer Paternal Uncle   . Lung cancer Paternal Uncle     Social History   Socioeconomic History  . Marital status: Single    Spouse name: Not on file  . Number of children: 0  . Years of education: Not on file  . Highest education level: Not on file  Occupational History  . Not on file  Tobacco Use  . Smoking status: Current Every Day Smoker    Packs/day: 1.00    Years: 37.00    Pack years: 37.00    Types: Cigarettes  . Smokeless tobacco: Current User    Types: Snuff  . Tobacco comment: dips some trying to quit smoking  Substance and Sexual Activity  . Alcohol use: No    Comment: former user quit in 2014. previous weekend drinker  . Drug use: Yes    Frequency: 7.0 times per week    Types: Marijuana    Comment: once daily  . Sexual activity: Not on file  Other Topics Concern  . Not on file  Social History Narrative  . Not on file   Social Determinants of Health   Financial Resource Strain:   . Difficulty of Paying Living Expenses:   Food Insecurity:   . Worried About Charity fundraiser in the Last Year:   . Academic librarian in the Last Year:   Transportation Oneal:   . Film/video editor (Medical):   Marland Kitchen Lack of Transportation (Non-Medical):   Physical Activity:   . Days of Exercise per Week:   . Minutes of Exercise per Session:   Stress:   . Feeling of Stress :   Social Connections:   . Frequency of Communication with Friends and Family:   . Frequency of Social Gatherings with Friends and Family:   . Attends Religious Services:   . Active Member of Clubs or Organizations:   . Attends Archivist Meetings:   Marland Kitchen Marital Status:     Review of Systems: See HPI  Physical Exam: BP (!) 164/107   Pulse 73   Temp (!) 97 F (36.1 C) (Oral)   Ht 5\' 7"  (1.702 m)   Wt 181 lb 9.6 oz (82.4 kg)   BMI 28.44 kg/m  General:   Alert and oriented. No distress noted. Pleasant and cooperative.  Head:  Normocephalic and atraumatic. Eyes:  Conjuctiva clear without scleral icterus. Mouth:  Mask in place Rectal: query posterior anal fissure. Obvious discomfort just with spreading of buttocks. Attempt at DRE was unsuccessful for complete evaluation, with worsening pain with DRE.  Msk:  Symmetrical without gross deformities. Normal posture. Extremities:  Without edema. Neurologic:  Alert and  oriented x4 Psych:  Alert and cooperative. Normal mood and affect.  ASSESSMENT/Plan: ARVELL MALTEZ is a 54 y.o. male presenting today with symptomatic anal fissure, Grade 2 internal hemorrhoids, and is not appropriate for banding today due to untreated anal fissure.   Unfortunately, he did not complete prior round of topical therapy, stating price was near 100$. I spoke with [place], and the 30 gram tube will be 45$. He is agreeable to this price. Symptomatically noted improvement while using the cream previously. I have called in [place] hemorrhoid cream compounded with nitro. He will need to complete at least 4 weeks of this. Will have him return in 4-6 weeks to re-evaluate candidacy for  banding.   Jason Needs, PhD, ANP-BC [facility]

## 2020-01-11 ENCOUNTER — Ambulatory Visit: Payer: Self-pay | Admitting: Nurse Practitioner

## 2020-01-24 ENCOUNTER — Encounter: Payer: Self-pay | Admitting: Internal Medicine

## 2020-01-24 ENCOUNTER — Telehealth: Payer: Self-pay | Admitting: Internal Medicine

## 2020-01-24 ENCOUNTER — Encounter: Payer: Self-pay | Admitting: Gastroenterology

## 2020-01-24 NOTE — Telephone Encounter (Signed)
Patient was a no show and letter sent  °

## 2020-03-06 ENCOUNTER — Ambulatory Visit: Payer: Self-pay | Admitting: Gastroenterology

## 2020-04-25 ENCOUNTER — Emergency Department (HOSPITAL_COMMUNITY): Payer: Self-pay

## 2020-04-25 ENCOUNTER — Encounter (HOSPITAL_COMMUNITY): Payer: Self-pay

## 2020-04-25 ENCOUNTER — Observation Stay (HOSPITAL_COMMUNITY)
Admission: EM | Admit: 2020-04-25 | Discharge: 2020-04-26 | Disposition: A | Discharge status: Home / Self Care | Payer: Self-pay | Attending: Internal Medicine | Admitting: Internal Medicine

## 2020-04-25 ENCOUNTER — Other Ambulatory Visit: Payer: Self-pay

## 2020-04-25 DIAGNOSIS — R079 Chest pain, unspecified: Principal | ICD-10-CM | POA: Insufficient documentation

## 2020-04-25 DIAGNOSIS — R202 Paresthesia of skin: Secondary | ICD-10-CM | POA: Insufficient documentation

## 2020-04-25 DIAGNOSIS — R55 Syncope and collapse: Secondary | ICD-10-CM | POA: Insufficient documentation

## 2020-04-25 DIAGNOSIS — Z85038 Personal history of other malignant neoplasm of large intestine: Secondary | ICD-10-CM | POA: Insufficient documentation

## 2020-04-25 DIAGNOSIS — K921 Melena: Secondary | ICD-10-CM

## 2020-04-25 DIAGNOSIS — F1210 Cannabis abuse, uncomplicated: Secondary | ICD-10-CM | POA: Insufficient documentation

## 2020-04-25 DIAGNOSIS — Z20822 Contact with and (suspected) exposure to covid-19: Secondary | ICD-10-CM | POA: Insufficient documentation

## 2020-04-25 DIAGNOSIS — F17210 Nicotine dependence, cigarettes, uncomplicated: Secondary | ICD-10-CM | POA: Insufficient documentation

## 2020-04-25 DIAGNOSIS — K6289 Other specified diseases of anus and rectum: Secondary | ICD-10-CM

## 2020-04-25 DIAGNOSIS — R1310 Dysphagia, unspecified: Secondary | ICD-10-CM

## 2020-04-25 DIAGNOSIS — J449 Chronic obstructive pulmonary disease, unspecified: Secondary | ICD-10-CM | POA: Insufficient documentation

## 2020-04-25 DIAGNOSIS — Z8739 Personal history of other diseases of the musculoskeletal system and connective tissue: Secondary | ICD-10-CM

## 2020-04-25 DIAGNOSIS — R519 Headache, unspecified: Secondary | ICD-10-CM

## 2020-04-25 DIAGNOSIS — R1319 Other dysphagia: Secondary | ICD-10-CM

## 2020-04-25 DIAGNOSIS — M5412 Radiculopathy, cervical region: Secondary | ICD-10-CM | POA: Insufficient documentation

## 2020-04-25 DIAGNOSIS — Z720 Tobacco use: Secondary | ICD-10-CM

## 2020-04-25 DIAGNOSIS — F419 Anxiety disorder, unspecified: Secondary | ICD-10-CM | POA: Insufficient documentation

## 2020-04-25 DIAGNOSIS — G459 Transient cerebral ischemic attack, unspecified: Secondary | ICD-10-CM

## 2020-04-25 DIAGNOSIS — I10 Essential (primary) hypertension: Secondary | ICD-10-CM | POA: Insufficient documentation

## 2020-04-25 DIAGNOSIS — E785 Hyperlipidemia, unspecified: Secondary | ICD-10-CM

## 2020-04-25 DIAGNOSIS — R29818 Other symptoms and signs involving the nervous system: Secondary | ICD-10-CM

## 2020-04-25 DIAGNOSIS — K219 Gastro-esophageal reflux disease without esophagitis: Secondary | ICD-10-CM | POA: Diagnosis present

## 2020-04-25 DIAGNOSIS — N289 Disorder of kidney and ureter, unspecified: Secondary | ICD-10-CM | POA: Insufficient documentation

## 2020-04-25 LAB — APTT: aPTT: 32 seconds (ref 24–36)

## 2020-04-25 LAB — COMPREHENSIVE METABOLIC PANEL
ALT: 18 U/L (ref 0–44)
AST: 17 U/L (ref 15–41)
Albumin: 3.7 g/dL (ref 3.5–5.0)
Alkaline Phosphatase: 74 U/L (ref 38–126)
Anion gap: 8 (ref 5–15)
BUN: 17 mg/dL (ref 6–20)
CO2: 26 mmol/L (ref 22–32)
Calcium: 8.7 mg/dL — ABNORMAL LOW (ref 8.9–10.3)
Chloride: 103 mmol/L (ref 98–111)
Creatinine, Ser: 1.25 mg/dL — ABNORMAL HIGH (ref 0.61–1.24)
GFR calc Af Amer: >60 mL/min (ref >60)
GFR calc non Af Amer: >60 mL/min (ref >60)
Glucose, Bld: 87 mg/dL (ref 70–99)
Potassium: 4.2 mmol/L (ref 3.5–5.1)
Sodium: 137 mmol/L (ref 135–145)
Total Bilirubin: 0.4 mg/dL (ref 0.3–1.2)
Total Protein: 7.1 g/dL (ref 6.5–8.1)

## 2020-04-25 LAB — RAPID URINE DRUG SCREEN, HOSP PERFORMED
Amphetamines: NOT DETECTED
Barbiturates: NOT DETECTED
Benzodiazepines: POSITIVE — AB
Cocaine: NOT DETECTED
Opiates: NOT DETECTED
Tetrahydrocannabinol: POSITIVE — AB

## 2020-04-25 LAB — DIFFERENTIAL
Abs Immature Granulocytes: 0.03 10*3/uL (ref 0.00–0.07)
Basophils Absolute: 0 10*3/uL (ref 0.0–0.1)
Basophils Relative: 0 %
Eosinophils Absolute: 0.2 10*3/uL (ref 0.0–0.5)
Eosinophils Relative: 2 %
Immature Granulocytes: 0 %
Lymphocytes Relative: 39 %
Lymphs Abs: 2.9 10*3/uL (ref 0.7–4.0)
Monocytes Absolute: 0.6 10*3/uL (ref 0.1–1.0)
Monocytes Relative: 7 %
Neutro Abs: 3.8 10*3/uL (ref 1.7–7.7)
Neutrophils Relative %: 52 %

## 2020-04-25 LAB — TROPONIN I (HIGH SENSITIVITY)
Troponin I (High Sensitivity): 3 ng/L (ref <18)
Troponin I (High Sensitivity): 3 ng/L (ref <18)

## 2020-04-25 LAB — URINALYSIS, ROUTINE W REFLEX MICROSCOPIC
Bilirubin Urine: NEGATIVE
Glucose, UA: NEGATIVE mg/dL
Hgb urine dipstick: NEGATIVE
Ketones, ur: NEGATIVE mg/dL
Leukocytes,Ua: NEGATIVE
Nitrite: NEGATIVE
Protein, ur: NEGATIVE mg/dL
Specific Gravity, Urine: 1.018 (ref 1.005–1.030)
pH: 5 (ref 5.0–8.0)

## 2020-04-25 LAB — CBC
HCT: 43.7 % (ref 39.0–52.0)
Hemoglobin: 14.1 g/dL (ref 13.0–17.0)
MCH: 30.3 pg (ref 26.0–34.0)
MCHC: 32.3 g/dL (ref 30.0–36.0)
MCV: 94 fL (ref 80.0–100.0)
Platelets: 180 10*3/uL (ref 150–400)
RBC: 4.65 MIL/uL (ref 4.22–5.81)
RDW: 12.7 % (ref 11.5–15.5)
WBC: 7.4 10*3/uL (ref 4.0–10.5)
nRBC: 0 % (ref 0.0–0.2)

## 2020-04-25 LAB — PROTIME-INR
INR: 1 (ref 0.8–1.2)
Prothrombin Time: 12.6 seconds (ref 11.4–15.2)

## 2020-04-25 LAB — CK: Total CK: 72 U/L (ref 49–397)

## 2020-04-25 LAB — SARS CORONAVIRUS 2 BY RT PCR (HOSPITAL ORDER, PERFORMED IN ~~LOC~~ HOSPITAL LAB): SARS Coronavirus 2: NEGATIVE

## 2020-04-25 LAB — ETHANOL: Alcohol, Ethyl (B): <10 mg/dL (ref <10)

## 2020-04-25 MED ORDER — ACETAMINOPHEN 650 MG RE SUPP: 650.0000 mg | RECTAL | Status: DC | PRN

## 2020-04-25 MED ORDER — ACETAMINOPHEN 325 MG PO TABS
650.0000 mg | ORAL_TABLET | ORAL | Status: DC | PRN
  Administered 2020-04-25: 650 mg via ORAL
  Filled 2020-04-25: qty 2

## 2020-04-25 MED ORDER — ONDANSETRON HCL 4 MG/2ML IJ SOLN
4.0000 mg | Freq: Once | INTRAMUSCULAR | Status: AC
  Administered 2020-04-25: 4 mg via INTRAVENOUS
  Filled 2020-04-25: qty 2

## 2020-04-25 MED ORDER — AMLODIPINE BESYLATE 5 MG PO TABS
5.0000 mg | ORAL_TABLET | Freq: Every day | ORAL | Status: DC
  Administered 2020-04-25 – 2020-04-26 (×2): 5 mg via ORAL
  Filled 2020-04-25 (×2): qty 1

## 2020-04-25 MED ORDER — ASPIRIN 81 MG PO CHEW
324.0000 mg | CHEWABLE_TABLET | Freq: Once | ORAL | Status: AC
  Administered 2020-04-25: 324 mg via ORAL
  Filled 2020-04-25: qty 4

## 2020-04-25 MED ORDER — ASPIRIN EC 81 MG PO TBEC
81.0000 mg | DELAYED_RELEASE_TABLET | Freq: Every day | ORAL | Status: DC
  Administered 2020-04-26: 81 mg via ORAL
  Filled 2020-04-25: qty 1

## 2020-04-25 MED ORDER — DICYCLOMINE HCL 10 MG PO CAPS: 10.0000 mg | ORAL_CAPSULE | Freq: Three times a day (TID) | ORAL | Status: DC | PRN

## 2020-04-25 MED ORDER — ALBUTEROL SULFATE (2.5 MG/3ML) 0.083% IN NEBU: 2.5000 mg | INHALATION_SOLUTION | Freq: Four times a day (QID) | RESPIRATORY_TRACT | Status: DC | PRN

## 2020-04-25 MED ORDER — SODIUM CHLORIDE 0.9 % IV SOLN: INTRAVENOUS | Status: DC

## 2020-04-25 MED ORDER — SENNOSIDES-DOCUSATE SODIUM 8.6-50 MG PO TABS: 1.0000 | ORAL_TABLET | Freq: Every evening | ORAL | Status: DC | PRN

## 2020-04-25 MED ORDER — STROKE: EARLY STAGES OF RECOVERY BOOK: Freq: Once | Status: AC

## 2020-04-25 MED ORDER — BUSPIRONE HCL 5 MG PO TABS
10.0000 mg | ORAL_TABLET | Freq: Three times a day (TID) | ORAL | Status: DC
  Administered 2020-04-25 – 2020-04-26 (×2): 10 mg via ORAL
  Filled 2020-04-25 (×2): qty 2

## 2020-04-25 MED ORDER — HEPARIN SODIUM (PORCINE) 5000 UNIT/ML IJ SOLN
5000.0000 [IU] | Freq: Three times a day (TID) | INTRAMUSCULAR | Status: DC
  Administered 2020-04-25 – 2020-04-26 (×2): 5000 [IU] via SUBCUTANEOUS
  Filled 2020-04-25 (×3): qty 1

## 2020-04-25 MED ORDER — ACETAMINOPHEN 160 MG/5ML PO SOLN: 650.0000 mg | ORAL | Status: DC | PRN

## 2020-04-25 MED ORDER — ALBUTEROL SULFATE HFA 108 (90 BASE) MCG/ACT IN AERS: 2.0000 | INHALATION_SPRAY | Freq: Four times a day (QID) | RESPIRATORY_TRACT | Status: DC | PRN

## 2020-04-25 MED ORDER — MOMETASONE FURO-FORMOTEROL FUM 100-5 MCG/ACT IN AERO
2.0000 | INHALATION_SPRAY | Freq: Two times a day (BID) | RESPIRATORY_TRACT | Status: DC
  Administered 2020-04-26: 2 via RESPIRATORY_TRACT
  Filled 2020-04-25: qty 8.8

## 2020-04-25 MED ORDER — ASPIRIN 81 MG PO CHEW: 324.0000 mg | CHEWABLE_TABLET | Freq: Once | ORAL | Status: DC

## 2020-04-25 MED ORDER — PANTOPRAZOLE SODIUM 40 MG PO TBEC
40.0000 mg | DELAYED_RELEASE_TABLET | Freq: Every day | ORAL | Status: DC
  Administered 2020-04-26: 40 mg via ORAL
  Filled 2020-04-25: qty 1

## 2020-04-25 MED ORDER — CLONIDINE HCL 0.2 MG PO TABS
0.2000 mg | ORAL_TABLET | Freq: Three times a day (TID) | ORAL | Status: DC
  Administered 2020-04-25 – 2020-04-26 (×2): 0.2 mg via ORAL
  Filled 2020-04-25 (×2): qty 1

## 2020-04-25 MED ORDER — FENTANYL CITRATE (PF) 100 MCG/2ML IJ SOLN
50.0000 ug | Freq: Once | INTRAMUSCULAR | Status: AC
  Administered 2020-04-25: 50 ug via INTRAVENOUS
  Filled 2020-04-25: qty 2

## 2020-04-25 MED ORDER — HYDRALAZINE HCL 20 MG/ML IJ SOLN
10.0000 mg | Freq: Once | INTRAMUSCULAR | Status: AC
  Administered 2020-04-25: 10 mg via INTRAVENOUS
  Filled 2020-04-25: qty 1

## 2020-04-25 MED ORDER — LORAZEPAM 2 MG/ML IJ SOLN
1.0000 mg | Freq: Once | INTRAMUSCULAR | Status: DC
  Filled 2020-04-25: qty 1

## 2020-04-25 NOTE — ED Provider Notes (Signed)
[facility] Provider Note   CSN: [id number] Arrival date & time: 04/25/20  1227     History Chief Complaint  Patient presents with  . Dizziness    [name] is a 54 y.o. male with a history of COPD, GERD, hypertension, renal insufficiency, history of acute pancreatitis and distant history of colon cancer presenting for evaluation of a 1 week history of weakness, intermittent episodes of chest pain and right-sided weakness and numbness.  He works in Agricultural [employer]  and has been working through the heat of the past week, 8 days ago was working a job when he became very nauseated and developed chest pain.  He went home and laid down for several hours after which his symptoms resolved.  He thought he had a some "bad hotdogs" for lunch.   Since this episode he has not felt well, stating he has had intermittent dizziness, describes a room spinning sensation with positional changes, but this has been  intermittent.  Also describes at times feeling confused, reporting difficulty remembering words and completing sentences.  He has also had numbness in his right arm and right leg which is also been intermittent x1 week, returning this am with the headache and near syncope.   He attempted to work today but developed sudden onset of headache when he stood up suddenly after bending over to pick up a tool and became lightheaded and near syncopal.  He also developed midsternal chest pressure with onset of this headache.  He has had no treatments prior to arrival.  HPI     Past Medical History:  Diagnosis Date  . Acute pancreatitis   . Anxiety   . Arthritis   . Cancer ([place])    Adenoma in colon  . Cannabis abuse   . COPD (chronic obstructive pulmonary disease) ([place])   . Depression   . Diverticulitis of colon with perforation 08/2010  . Dyspnea   . GERD (gastroesophageal reflux disease)   . History of hiatal hernia   . Hypertension    taken off lisinopril by renal md 08/2011   . Renal insufficiency    follwed by Dr. Hinda Lenis  . Sleep apnea    does not use CPAP, cannot tolerate    Patient Active Problem List   Diagnosis Date Noted  . Focal neurological deficit 04/25/2020  . Anal fissure 12/22/2019  . Abdominal pain 12/06/2019  . Internal hemorrhoids 12/06/2019  . Acute esophagitis 10/12/2019  . Black stools 10/12/2019  . Diarrhea 10/12/2019  . 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 Chronic obstructive pulmonary disease ([place]) 12/14/2017  . Esophageal dysphagia 09/24/2017  . History of reversal of ileostomy 09/24/2017  . History of colostomy reversal 09/24/2017  . History of colonic polyps 09/24/2017  . Head ache 05/06/2012  . Chronic abdominal pain 05/06/2012  . Vomiting 05/06/2012  . GERD (gastroesophageal reflux disease) 03/15/2012  . Gastritis and duodenitis 03/15/2012  . Chronic generalized abdominal pain 02/10/2012  . Epigastric pain 02/10/2012  . Nausea 02/10/2012  . Diverticulitis of colon with perforation 02/10/2012  . ANXIETY 10/05/2009  . Cannabis abuse 10/05/2009  . HTN (hypertension) 10/05/2009  . ACUTE PANCREATITIS 10/05/2009  . HEMATEMESIS 10/05/2009  . HEMATOCHEZIA 10/05/2009    Past Surgical History:  Procedure Laterality Date  . APPENDECTOMY    . BIOPSY  12/14/2017   Procedure: BIOPSY;  Surgeon: Daneil Dolin, MD;  [facility];  Service: Endoscopy;;  gastric   . BIOPSY  10/13/2019   Procedure: BIOPSY;  Surgeon: Daneil Dolin, MD;  [facility];  Service: Endoscopy;;  esophagus  . clamp left in colon after abd surgery    . COLON SURGERY  05/2011   [facility]. exp laparotomy with colostomy takedown, lysis of adhesions (3hours), loop ileostomy. complicated by abscess formation requiring percutaneous drainage  . colonoscopy via stoma with endoscopy of [name]  04/2011   [facility]. two sessile polyps in trv colon. complete resection but only partial retrieval. congested mucosa in area 3cm proximal to stoma, erythematous  mucosa in Hartmann pouch. repeat tcs 8/2017path-adenomatous polyp  . COLONOSCOPY WITH PROPOFOL N/A 12/14/2017   Dr. Gala Romney: 2 4 to 6 mm polyps in the cecum, tubular adenomas, single rectal polyp tubular adenoma, nonbleeding internal hemorrhoids.  Appears to be a surgical clip at the appendiceal orifice.  Next colonoscopy in 5 years  . ESOPHAGOGASTRODUODENOSCOPY  03/05/2012   erosive reflux esophagitis, bulbar erosions, gastric erosions/petichae, bx showed gastritis, no H.Pylori. Procedure: ESOPHAGOGASTRODUODENOSCOPY (EGD);  Surgeon: Daneil Dolin, MD;  [facility];  Service: Endoscopy;  Laterality: N/A;  11:30  . ESOPHAGOGASTRODUODENOSCOPY (EGD) WITH PROPOFOL N/A 12/14/2017   Dr. Gala Romney: Erosive reflux esophagitis with questionable mild stricturing/non-obstruction at this level status post dilation.  multiple gastric erosions in the stomach biopsy showed reactive gastropathy but no H. pylori.  . ESOPHAGOGASTRODUODENOSCOPY (EGD) WITH PROPOFOL N/A 10/13/2019   negative Barrett's, normal stomach  . HERNIA REPAIR    . KNEE SURGERY Right 2006  . MALONEY DILATION N/A 12/14/2017   Procedure: Venia Minks DILATION;  Surgeon: Daneil Dolin, MD;  [facility];  Service: Endoscopy;  Laterality: N/A;  . MULTIPLE EXTRACTIONS WITH ALVEOLOPLASTY  02/23/2012   Procedure: MULTIPLE EXTRACION WITH ALVEOLOPLASTY;  Surgeon: Gae Bon, DDS;  Location: [facility];  Service: Oral Surgery;  Laterality: Bilateral;  Removal of left mandibular torus, multiple extractions with alveoloplasty  . POLYPECTOMY  12/14/2017   Procedure: POLYPECTOMY;  Surgeon: Daneil Dolin, MD;  [facility];  Service: Endoscopy;;  . reversal of colostomy  10/2011   per patient, did not have report  . right knee surgery    . sigmoid colectomy with end colostomy, Hartmann's pouch  08/2010   [facility]. complicated diverticulitis  . UMBILICAL HERNIA REPAIR         Family History  Problem Relation Age of Onset  . Stomach cancer  Father   . Lung cancer Father   . Throat cancer Father   . Cancer Father        lung and throat cancer  . Hypertension Father   . Liver disease Cousin        cirrhosis, etoh  . Throat cancer Paternal Uncle   . Lung cancer Paternal Uncle   . Colon cancer Cousin 13  . Heart disease Mother   . Hypertension Mother   . Colon cancer Maternal Uncle   . Throat cancer Paternal Uncle   . Lung cancer Paternal Uncle   . Throat cancer Paternal Uncle   . Lung cancer Paternal Uncle     Social History   Tobacco Use  . Smoking status: Current Every Day Smoker    Packs/day: 1.00    Years: 37.00    Pack years: 37.00    Types: Cigarettes  . Smokeless tobacco: Current User    Types: Snuff  . Tobacco comment: dips some trying to quit smoking  Vaping Use  . Vaping Use: Never used  Substance Use Topics  . Alcohol use: Not  Currently  . Drug use: Yes    Frequency: 7.0 times per week    Types: Marijuana    Comment: once daily    Home Medications Prior to Admission medications   Medication Sig Start Date End Date Taking? Authorizing Provider  albuterol (PROVENTIL HFA;VENTOLIN HFA) 108 (90 Base) MCG/ACT inhaler Inhale 2 puffs into the lungs every 6 (six) hours as needed for wheezing or shortness of breath.    [provider]  busPIRone (BUSPAR) 10 MG tablet Take 10 mg by mouth 3 (three) times daily.    [provider]  cloNIDine (CATAPRES) 0.1 MG tablet Take 2 tablets (0.2 mg total) by mouth 2 (two) times daily. Patient taking differently: Take 0.2 mg by mouth 3 (three) times daily.  10/10/19   Soyla Dryer, PA-C  cyclobenzaprine (FLEXERIL) 10 MG tablet Take 10 mg by mouth 3 (three) times daily as needed for muscle spasms.    [provider]  dicyclomine (BENTYL) 10 MG capsule Take 1 capsule (10 mg total) by mouth 3 (three) times daily as needed for spasms (or diarrhea). 12/06/19   Carlis Stable, NP  HYDROcodone-acetaminophen (NORCO/VICODIN) 5-325 MG tablet Take 1-2  tablets by mouth every 4 (four) hours as needed for moderate pain or severe pain. 10/12/19   Carlis Stable, NP  hydrocortisone (ANUSOL-HC) 2.5 % rectal cream Place 1 application rectally 2 (two) times daily. 12/22/19   Annitta Needs, NP  mometasone-formoterol (DULERA) 100-5 MCG/ACT AERO Inhale 2 puffs into the lungs 2 (two) times daily.    [provider]  ondansetron (ZOFRAN ODT) 4 MG disintegrating tablet Take 1 tablet (4 mg total) by mouth every 8 (eight) hours as needed for nausea or vomiting. 10/08/19   Christopher Glasscock, Almyra Free, PA-C  ondansetron (ZOFRAN) 4 MG tablet Take 4 mg by mouth every 6 (six) hours as needed. 05/14/19   [provider]  pantoprazole (PROTONIX) 40 MG tablet Take 1 tablet (40 mg total) by mouth daily. Patient taking differently: Take 40 mg by mouth 2 (two) times daily.  09/08/19   Carlis Stable, NP    Allergies    Codeine, Latex, Lisinopril, Morphine, and Adhesive [tape]  Review of Systems   Review of Systems  Constitutional: Negative for chills and fever.  HENT: Negative.   Respiratory: Negative for shortness of breath.   Cardiovascular: Positive for chest pain.  Gastrointestinal: Negative for abdominal pain, nausea and vomiting.  Musculoskeletal: Negative.   Skin: Negative.   Neurological: Positive for dizziness, syncope, weakness and headaches.    Physical Exam Updated Vital Signs BP (!) 151/116   Pulse (!) 55   Temp 98.4 F (36.9 C) (Oral)   Resp 10   Ht 5\' 7"  (1.702 m)   Wt 84.6 kg   SpO2 99%   BMI 29.21 kg/m   Physical Exam Vitals and nursing note reviewed.  Constitutional:      Appearance: He is well-developed.  HENT:     Head: Normocephalic and atraumatic.  Eyes:     Conjunctiva/sclera: Conjunctivae normal.  Cardiovascular:     Rate and Rhythm: Normal rate and regular rhythm.     Heart sounds: Normal heart sounds.  Pulmonary:     Effort: Pulmonary effort is normal.     Breath sounds: Normal breath sounds. No wheezing.  Abdominal:      General: Bowel sounds are normal.     Palpations: Abdomen is soft.     Tenderness: There is no abdominal tenderness.  Musculoskeletal:  General: Normal range of motion.     Cervical back: Normal range of motion.  Skin:    General: Skin is warm and dry.  Neurological:     Mental Status: He is alert and oriented to person, place, and time.     Cranial Nerves: No cranial nerve deficit.     Motor: Weakness present. No pronator drift.     Coordination: Finger-Nose-Finger Test and Heel to Braxton Test normal. Rapid alternating movements normal.     Comments: Decreased sensation to fine touch right upper and lower extremities.  Equal grip strength.       ED Results / Procedures / Treatments   Labs (all labs ordered are listed, but only abnormal results are displayed) Labs Reviewed  COMPREHENSIVE METABOLIC PANEL - Abnormal; Notable for the following components:      Result Value   Creatinine, Ser 1.25 (*)    Calcium 8.7 (*)    All other components within normal limits  RAPID URINE DRUG SCREEN, HOSP PERFORMED - Abnormal; Notable for the following components:   Benzodiazepines POSITIVE (*)    Tetrahydrocannabinol POSITIVE (*)    All other components within normal limits  SARS CORONAVIRUS 2 BY RT PCR (HOSPITAL ORDER, [facility])  ETHANOL  PROTIME-INR  APTT  CBC  DIFFERENTIAL  URINALYSIS, ROUTINE W REFLEX MICROSCOPIC  CK  TROPONIN I (HIGH SENSITIVITY)  TROPONIN I (HIGH SENSITIVITY)    EKG EKG Interpretation  Date/Time:  Wednesday April 25 2020 12:38:47 EDT Ventricular Rate:  68 PR Interval:    QRS Duration: 92 QT Interval:  400 QTC Calculation: 426 R Axis:   48 Text Interpretation: Sinus rhythm RSR' in V1 or V2, right VCD or RVH ST elev, probable normal early repol pattern No significant change since last tracing Reconfirmed by Fredia Sorrow [phone]) on 04/25/2020 1:20:29 PM   Radiology CT Head Wo Contrast  Result Date: 04/25/2020 CLINICAL  DATA:  Headache rule out intracranial hemorrhage EXAM: CT HEAD WITHOUT CONTRAST TECHNIQUE: Contiguous axial images were obtained from the base of the skull through the vertex without intravenous contrast. COMPARISON:  CT head 07/14/2019 FINDINGS: Brain: No evidence of acute infarction, hemorrhage, hydrocephalus, extra-axial collection or mass lesion/mass effect. Vascular: Negative for hyperdense vessel Skull: Negative Sinuses/Orbits: Paranasal sinuses clear.  Negative orbit Other: None IMPRESSION: Negative CT head Electronically Signed   By: Franchot Gallo M.D.   On: 04/25/2020 14:33   MR BRAIN WO CONTRAST  Result Date: 04/25/2020 CLINICAL DATA:  54 year old male with sudden onset headache, presyncope. EXAM: MRI HEAD WITHOUT CONTRAST TECHNIQUE: Multiplanar, multiecho pulse sequences of the brain and surrounding structures were obtained without intravenous contrast. COMPARISON:  Head CT earlier today. Report of prior brain MRI [facility] 04/05/2007 (no images available). FINDINGS: The examination was degraded by motion artifact and had to be discontinued prior to completion by patient request. No diagnostic sagittal T1 weighted imaging or axial T2 imaging was obtained. Brain: No restricted diffusion to suggest acute infarction. No midline shift, mass effect, evidence of mass lesion, ventriculomegaly, extra-axial collection or acute intracranial hemorrhage. Cervicomedullary junction and pituitary are within normal limits. Pearline Cables and white matter signal is within normal limits for age throughout the brain. No cortical encephalomalacia or chronic cerebral blood products identified. Vascular: Judging from coronal T2 imaging the major vascular flow voids at the skull base are preserved. Skull and upper cervical spine: Grossly normal skull. Cervical spine not evaluated. Sinuses/Orbits: Orbits appear negative. Paranasal sinuses and mastoids are stable and  well pneumatized. Other: Grossly normal visible  internal auditory structures. Scalp and face appear negative. IMPRESSION: 1. Discontinued exam prior to completion, no diagnostic sagittal T1 or axial T2 imaging could be obtained. 2. No acute intracranial abnormality, normal for age noncontrast MRI appearance of the brain. Electronically Signed   By: Genevie Ann M.D.   On: 04/25/2020 16:37   DG Chest Portable 1 View  Result Date: 04/25/2020 CLINICAL DATA:  Acute onset headache and near syncope today after bending over. EXAM: PORTABLE CHEST 1 VIEW COMPARISON: Single-view of the chest 10/08/2019. FINDINGS: Lungs clear. Heart size normal. No pneumothorax or pleural fluid. No acute or focal bony abnormality. IMPRESSION: No acute disease. Electronically Signed   By: Inge Rise M.D.   On: 04/25/2020 14:58    Procedures Procedures (including critical care time)  Medications Ordered in ED Medications  aspirin chewable tablet 324 mg (has no administration in time range)  aspirin EC tablet 81 mg (has no administration in time range)  amLODipine (NORVASC) tablet 5 mg (has no administration in time range)  fentaNYL (SUBLIMAZE) injection 50 mcg (50 mcg Intravenous Given 04/25/20 1643)  ondansetron (ZOFRAN) injection 4 mg (4 mg Intravenous Given 04/25/20 1637)  hydrALAZINE (APRESOLINE) injection 10 mg (10 mg Intravenous Given 04/25/20 1749)    ED Course  I have reviewed the triage vital signs and the nursing notes.  Pertinent labs & imaging results that were available during my care of the patient were reviewed by me and considered in my medical decision making (see chart for details).    MDM Rules/Calculators/A&P                          Pt with intermittent chest pain, right sided extremity weakness, near syncope.  Labs and imaging reviewed and discussed with pt.  He is nonfocal on his exam, CT imaging and MR reviewed and negative for acute cva, although MR suboptimal contrasted study given artifact.  He is hypertensive here, denies any missed bp  medication doses.  concern for possible tia episodes.  Will also benefit from ACS rule out.    Discussed with Dr. Merlene Laughter who will consult pt in hospital. No further tx rec at this time.  Call to Dr. Waldron [facility] who accepts pt for admission.     Final Clinical Impression(s) / ED Diagnoses Final diagnoses:  Chest pain, unspecified type  Near syncope  History of weakness of extremity  Acute nonintractable headache, unspecified headache type    Rx / DC Orders ED Discharge Orders    None       Landis Martins 04/25/20 1841    Fredia Sorrow, MD 04/30/20 [phone]

## 2020-04-25 NOTE — ED Notes (Signed)
Attempted report x1. 

## 2020-04-25 NOTE — ED Triage Notes (Signed)
Pt reports around 10am he bent over and when he stood up he had sudden onset of headache and felt like he was going to pass out.  Also reports intermittent numbness and heaviness in r arm for the past 3 years.  Reports most recently started feeling numbness last night around 9pm.  Face symmetric, pt alert and oriented.  Describes dizziness as feeling like he's going to pass out.

## 2020-04-25 NOTE — Progress Notes (Signed)
MD notified of pt passed swallow screen and needs a diet order.

## 2020-04-25 NOTE — ED Notes (Signed)
Placed urinal at bedside and asked for urine sample as soon as pt could provide one.

## 2020-04-25 NOTE — ED Notes (Signed)
ED TO INPATIENT HANDOFF REPORT  ED Nurse Name and Phone #:   S Name/Age/Gender [name] 54 y.o. male Room/Bed: APA09/APA09  Code Status   Code Status: Prior  Home/SNF/Other Home Patient oriented to: self, place, time and situation Is this baseline? Yes   Triage Complete: Triage complete  Chief Complaint Focal neurological deficit [R29.818]  Triage Note Pt reports around 10am he bent over and when he stood up he had sudden onset of headache and felt like he was going to pass out.  Also reports intermittent numbness and heaviness in r arm for the past 3 years.  Reports most recently started feeling numbness last night around 9pm.  Face symmetric, pt alert and oriented.  Describes dizziness as feeling like he's going to pass out.     Allergies Allergies  Allergen Reactions  . Codeine Itching and Nausea And Vomiting  . Latex Other (See Comments)    Turns skin red.   . Lisinopril     Kidney failure  . Morphine Hives  . Adhesive [Tape] Rash    Level of Care/Admitting Diagnosis ED Disposition    ED Disposition Condition Comment   Admit  Hospital Area: [facility] [[id number]]  Level of Care: Telemetry [5]  Covid Evaluation: Asymptomatic Screening Protocol (No Symptoms)  Diagnosis: Focal neurological deficit [845364]  Admitting Physician: Manfred Shirts  Attending Physician: Waldron Labs, DAWOOD S [[id number]]       B Medical/Surgery History Past Medical History:  Diagnosis Date  . Acute pancreatitis   . Anxiety   . Arthritis   . Cancer ([place])    Adenoma in colon  . Cannabis abuse   . COPD (chronic obstructive pulmonary disease) ([place])   . Depression   . Diverticulitis of colon with perforation 08/2010  . Dyspnea   . GERD (gastroesophageal reflux disease)   . History of hiatal hernia   . Hypertension    taken off lisinopril by renal md 08/2011  . Renal insufficiency    follwed by Dr. Hinda Lenis  . Sleep apnea    does not use CPAP, cannot  tolerate   Past Surgical History:  Procedure Laterality Date  . APPENDECTOMY    . BIOPSY  12/14/2017   Procedure: BIOPSY;  Surgeon: Daneil Dolin, MD;  [facility];  Service: Endoscopy;;  gastric   . BIOPSY  10/13/2019   Procedure: BIOPSY;  Surgeon: Daneil Dolin, MD;  [facility];  Service: Endoscopy;;  esophagus  . clamp left in colon after abd surgery    . COLON SURGERY  05/2011   [facility]. exp laparotomy with colostomy takedown, lysis of adhesions (3hours), loop ileostomy. complicated by abscess formation requiring percutaneous drainage  . colonoscopy via stoma with endoscopy of [name]  04/2011   [facility]. two sessile polyps in trv colon. complete resection but only partial retrieval. congested mucosa in area 3cm proximal to stoma, erythematous mucosa in Hartmann pouch. repeat tcs 8/2017path-adenomatous polyp  . COLONOSCOPY WITH PROPOFOL N/A 12/14/2017   Dr. Gala Romney: 2 4 to 6 mm polyps in the cecum, tubular adenomas, single rectal polyp tubular adenoma, nonbleeding internal hemorrhoids.  Appears to be a surgical clip at the appendiceal orifice.  Next colonoscopy in 5 years  . ESOPHAGOGASTRODUODENOSCOPY  03/05/2012   erosive reflux esophagitis, bulbar erosions, gastric erosions/petichae, bx showed gastritis, no H.Pylori. Procedure: ESOPHAGOGASTRODUODENOSCOPY (EGD);  Surgeon: Daneil Dolin, MD;  [facility];  Service: Endoscopy;  Laterality: N/A;  11:30  . ESOPHAGOGASTRODUODENOSCOPY (  EGD) WITH PROPOFOL N/A 12/14/2017   Dr. Gala Romney: Erosive reflux esophagitis with questionable mild stricturing/non-obstruction at this level status post dilation.  multiple gastric erosions in the stomach biopsy showed reactive gastropathy but no H. pylori.  . ESOPHAGOGASTRODUODENOSCOPY (EGD) WITH PROPOFOL N/A 10/13/2019   negative Barrett's, normal stomach  . HERNIA REPAIR    . KNEE SURGERY Right 2006  . MALONEY DILATION N/A 12/14/2017   Procedure: Venia Minks DILATION;  Surgeon: Daneil Dolin, MD;  [facility];  Service: Endoscopy;  Laterality: N/A;  . MULTIPLE EXTRACTIONS WITH ALVEOLOPLASTY  02/23/2012   Procedure: MULTIPLE EXTRACION WITH ALVEOLOPLASTY;  Surgeon: Gae Bon, DDS;  Location: [facility];  Service: Oral Surgery;  Laterality: Bilateral;  Removal of left mandibular torus, multiple extractions with alveoloplasty  . POLYPECTOMY  12/14/2017   Procedure: POLYPECTOMY;  Surgeon: Daneil Dolin, MD;  [facility];  Service: Endoscopy;;  . reversal of colostomy  10/2011   per patient, did not have report  . right knee surgery    . sigmoid colectomy with end colostomy, Hartmann's pouch  08/2010   [facility]. complicated diverticulitis  . UMBILICAL HERNIA REPAIR       A IV Location/Drains/Wounds Patient Lines/Drains/Airways Status    Active Line/Drains/Airways    Name Placement date Placement time Site Days   Peripheral IV 04/25/20 Right Antecubital 04/25/20  1301  Antecubital  less than 1          Intake/Output Last 24 hours No intake or output data in the 24 hours ending 04/25/20 2052  Labs/Imaging Results for orders placed or performed during the hospital encounter of 04/25/20 (from the past 48 hour(s))  Ethanol     Status: None   Collection Time: 04/25/20 12:42 PM  Result Value Ref Range   Alcohol, Ethyl (B) <10 <10 mg/dL    Comment: (NOTE) Lowest detectable limit for serum alcohol is 10 mg/dL.  For medical purposes only. Performed at [facility], [address] [place], [place] [address]   Protime-INR     Status: None   Collection Time: 04/25/20 12:42 PM  Result Value Ref Range   Prothrombin Time 12.6 11.4 - 15.2 seconds   INR 1.0 0.8 - 1.2    Comment: (NOTE) INR goal varies based on device and disease states. Performed at [facility], [address] [place], [place] [address]   APTT     Status: None   Collection Time: 04/25/20 12:42 PM  Result Value Ref Range   aPTT 32 24 - 36 seconds    Comment: Performed at [facility], [address] [place], [place] [address]  CBC     Status: None   Collection Time: 04/25/20 12:42 PM  Result Value Ref Range   WBC 7.4 4.0 - 10.5 K/uL   RBC 4.65 4.22 - 5.81 MIL/uL   Hemoglobin 14.1 13.0 - 17.0 g/dL   HCT 43.7 39 - 52 %   MCV 94.0 80.0 - 100.0 fL   MCH 30.3 26.0 - 34.0 pg   MCHC 32.3 30.0 - 36.0 g/dL   RDW 12.7 11.5 - 15.5 %   Platelets 180 150 - 400 K/uL   nRBC 0.0 0.0 - 0.2 %    Comment: Performed at [facility], [address] [place], [place] [address]  Differential     Status: None   Collection Time: 04/25/20 12:42 PM  Result Value Ref Range   Neutrophils Relative % 52 %   Neutro Abs 3.8 1.7 -  7.7 K/uL   Lymphocytes Relative 39 %   Lymphs Abs 2.9 0.7 - 4.0 K/uL   Monocytes Relative 7 %   Monocytes Absolute 0.6 0 - 1 K/uL   Eosinophils Relative 2 %   Eosinophils Absolute 0.2 0 - 0 K/uL   Basophils Relative 0 %   Basophils Absolute 0.0 0 - 0 K/uL   Immature Granulocytes 0 %   Abs Immature Granulocytes 0.03 0.00 - 0.07 K/uL    Comment: Performed at [facility], [address] [place], [place] [address]  Comprehensive metabolic panel     Status: Abnormal   Collection Time: 04/25/20 12:42 PM  Result Value Ref Range   Sodium 137 135 - 145 mmol/L   Potassium 4.2 3.5 - 5.1 mmol/L   Chloride 103 98 - 111 mmol/L   CO2 26 22 - 32 mmol/L   Glucose, Bld 87 70 - 99 mg/dL    Comment: Glucose reference range applies only to samples taken after fasting for at least 8 hours.   BUN 17 6 - 20 mg/dL   Creatinine, Ser 1.25 (H) 0.61 - 1.24 mg/dL   Calcium 8.7 (L) 8.9 - 10.3 mg/dL   Total Protein 7.1 6.5 - 8.1 g/dL   Albumin 3.7 3.5 - 5.0 g/dL   AST 17 15 - 41 U/L   ALT 18 0 - 44 U/L   Alkaline Phosphatase 74 38 - 126 U/L   Total Bilirubin 0.4 0.3 - 1.2 mg/dL   GFR calc non Af Amer >60 >60 mL/min   GFR calc Af Amer >60 >60 mL/min   Anion gap 8 5 - 15    Comment: Performed at [facility], [address] [place], [place] [address]  Troponin I (High  Sensitivity)     Status: None   Collection Time: 04/25/20 12:42 PM  Result Value Ref Range   Troponin I (High Sensitivity) 3 <18 ng/L    Comment: (NOTE) Elevated high sensitivity troponin I (hsTnI) values and significant  changes across serial measurements may suggest ACS but many other  chronic and acute conditions are known to elevate hsTnI results.  Refer to the "Links" section for chest pain algorithms and additional  guidance. Performed at [facility], [address] [place], [place] [address]   CK     Status: None   Collection Time: 04/25/20 12:42 PM  Result Value Ref Range   Total CK 72 49.0 - 397.0 U/L    Comment: Performed at [facility], [address] [place], [place] [address]  Urine rapid drug screen (hosp performed)     Status: Abnormal   Collection Time: 04/25/20 12:49 PM  Result Value Ref Range   Opiates NONE DETECTED NONE DETECTED   Cocaine NONE DETECTED NONE DETECTED   Benzodiazepines POSITIVE (A) NONE DETECTED   Amphetamines NONE DETECTED NONE DETECTED   Tetrahydrocannabinol POSITIVE (A) NONE DETECTED   Barbiturates NONE DETECTED NONE DETECTED    Comment: (NOTE) DRUG SCREEN FOR MEDICAL PURPOSES ONLY.  IF CONFIRMATION IS NEEDED FOR ANY PURPOSE, NOTIFY LAB WITHIN 5 DAYS.  LOWEST DETECTABLE LIMITS FOR URINE DRUG SCREEN Drug Class                     Cutoff (ng/mL) Amphetamine and metabolites    1000 Barbiturate and metabolites    200 Benzodiazepine                 295 Tricyclics and metabolites     300 Opiates and metabolites  300 Cocaine and metabolites        300 THC                            50 Performed at [facility], [address], [place], [place] [address]   Urinalysis, Routine w reflex microscopic Urine, Random     Status: None   Collection Time: 04/25/20 12:49 PM  Result Value Ref Range   Color, Urine YELLOW YELLOW   APPearance CLEAR CLEAR   Specific Gravity, Urine 1.018 1.005 - 1.030   pH 5.0 5.0 - 8.0   Glucose, UA NEGATIVE  NEGATIVE mg/dL   Hgb urine dipstick NEGATIVE NEGATIVE   Bilirubin Urine NEGATIVE NEGATIVE   Ketones, ur NEGATIVE NEGATIVE mg/dL   Protein, ur NEGATIVE NEGATIVE mg/dL   Nitrite NEGATIVE NEGATIVE   Leukocytes,Ua NEGATIVE NEGATIVE    Comment: Performed at [facility], [address], [place], [place] [address]  Troponin I (High Sensitivity)     Status: None   Collection Time: 04/25/20  2:58 PM  Result Value Ref Range   Troponin I (High Sensitivity) 3 <18 ng/L    Comment: (NOTE) Elevated high sensitivity troponin I (hsTnI) values and significant  changes across serial measurements may suggest ACS but many other  chronic and acute conditions are known to elevate hsTnI results.  Refer to the "Links" section for chest pain algorithms and additional  guidance. Performed at [facility], [address] [place], [place] [address]   SARS Coronavirus 2 by RT PCR (hospital order, performed in [facility] hospital lab) Nasopharyngeal Nasopharyngeal Swab     Status: None   Collection Time: 04/25/20  6:25 PM   Specimen: Nasopharyngeal Swab  Result Value Ref Range   SARS Coronavirus 2 NEGATIVE NEGATIVE    Comment: (NOTE) SARS-CoV-2 target nucleic acids are NOT DETECTED.  The SARS-CoV-2 RNA is generally detectable in upper and lower respiratory specimens during the acute phase of infection. The lowest concentration of SARS-CoV-2 viral copies this assay can detect is 250 copies / mL. A negative result does not preclude SARS-CoV-2 infection and should not be used as the sole basis for treatment or other patient management decisions.  A negative result may occur with improper specimen collection / handling, submission of specimen other than nasopharyngeal swab, presence of viral mutation(s) within the areas targeted by this assay, and inadequate number of viral copies (<250 copies / mL). A negative result must be combined with clinical observations, patient history, and epidemiological  information.  Fact Sheet for Patients:   StrictlyIdeas.no  Fact Sheet for Healthcare Providers: BankingDealers.co.za  This test is not yet approved or  cleared by the [place] FDA and has been authorized for detection and/or diagnosis of SARS-CoV-2 by FDA under an Emergency Use Authorization (EUA).  This EUA will remain in effect (meaning this test can be used) for the duration of the COVID-19 declaration under Section 564(b)(1) of the Act, 21 U.S.C. section 360bbb-3(b)(1), unless the authorization is terminated or revoked sooner.  Performed at [facility], [address] [place],  [address]    CT Head Wo Contrast  Result Date: 04/25/2020 CLINICAL DATA:  Headache rule out intracranial hemorrhage EXAM: CT HEAD WITHOUT CONTRAST TECHNIQUE: Contiguous axial images were obtained from the base of the skull through the vertex without intravenous contrast. COMPARISON:  CT head 07/14/2019 FINDINGS: Brain: No evidence of acute infarction, hemorrhage, hydrocephalus, extra-axial collection or mass lesion/mass effect. Vascular: Negative for hyperdense  vessel Skull: Negative Sinuses/Orbits: Paranasal sinuses clear.  Negative orbit Other: None IMPRESSION: Negative CT head Electronically Signed   By: Franchot Gallo M.D.   On: 04/25/2020 14:33   MR BRAIN WO CONTRAST  Result Date: 04/25/2020 CLINICAL DATA:  54 year old male with sudden onset headache, presyncope. EXAM: MRI HEAD WITHOUT CONTRAST TECHNIQUE: Multiplanar, multiecho pulse sequences of the brain and surrounding structures were obtained without intravenous contrast. COMPARISON:  Head CT earlier today. Report of prior brain MRI [facility] 04/05/2007 (no images available). FINDINGS: The examination was degraded by motion artifact and had to be discontinued prior to completion by patient request. No diagnostic sagittal T1 weighted imaging or axial T2 imaging was obtained. Brain: No  restricted diffusion to suggest acute infarction. No midline shift, mass effect, evidence of mass lesion, ventriculomegaly, extra-axial collection or acute intracranial hemorrhage. Cervicomedullary junction and pituitary are within normal limits. Pearline Cables and white matter signal is within normal limits for age throughout the brain. No cortical encephalomalacia or chronic cerebral blood products identified. Vascular: Judging from coronal T2 imaging the major vascular flow voids at the skull base are preserved. Skull and upper cervical spine: Grossly normal skull. Cervical spine not evaluated. Sinuses/Orbits: Orbits appear negative. Paranasal sinuses and mastoids are stable and well pneumatized. Other: Grossly normal visible internal auditory structures. Scalp and face appear negative. IMPRESSION: 1. Discontinued exam prior to completion, no diagnostic sagittal T1 or axial T2 imaging could be obtained. 2. No acute intracranial abnormality, normal for age noncontrast MRI appearance of the brain. Electronically Signed   By: Genevie Ann M.D.   On: 04/25/2020 16:37   DG Chest Portable 1 View  Result Date: 04/25/2020 CLINICAL DATA:  Acute onset headache and near syncope today after bending over. EXAM: PORTABLE CHEST 1 VIEW COMPARISON: Single-view of the chest 10/08/2019. FINDINGS: Lungs clear. Heart size normal. No pneumothorax or pleural fluid. No acute or focal bony abnormality. IMPRESSION: No acute disease. Electronically Signed   By: Inge Rise M.D.   On: 04/25/2020 14:58    Pending Labs [employer] (From admission, onward) Comment          Start     Ordered   Signed and Held  HIV Antibody (routine testing w rflx)  (HIV Antibody (Routine testing w reflex) panel)  Once,   R        Signed and Held   Signed and Held  Hemoglobin A1c  Tomorrow morning,   R        Signed and Held   Signed and Held  Lipid panel  Tomorrow morning,   R       Comments: Fasting    Signed and Held   Signed and Held  CBC   (heparin)  Once,   R       Comments: Baseline for heparin therapy IF NOT ALREADY DRAWN.  Notify MD if PLT < 100 K.    Signed and Held   Signed and Held  Creatinine, serum  (heparin)  Once,   R       Comments: Baseline for heparin therapy IF NOT ALREADY DRAWN.    Signed and Held          Vitals/Pain Today's Vitals   04/25/20 1921 04/25/20 1929 04/25/20 1959 04/25/20 2030  BP:  (!) 151/99 (!) 161/109 (!) 151/100  Pulse:  (!) 57 (!) 57 (!) 55  Resp:      Temp:      TempSrc:      SpO2:  95% 92% 96%  Weight:      Height:      PainSc: 6        Isolation Precautions No active isolations  Medications Medications  aspirin EC tablet 81 mg (has no administration in time range)  amLODipine (NORVASC) tablet 5 mg (5 mg Oral Given 04/25/20 1917)  fentaNYL (SUBLIMAZE) injection 50 mcg (50 mcg Intravenous Given 04/25/20 1643)  ondansetron (ZOFRAN) injection 4 mg (4 mg Intravenous Given 04/25/20 1637)  hydrALAZINE (APRESOLINE) injection 10 mg (10 mg Intravenous Given 04/25/20 1749)  aspirin chewable tablet 324 mg (324 mg Oral Given 04/25/20 1850)    Mobility walks High fall risk   Focused Assessments   R Recommendations: See Admitting Provider Note  Report given to:   Additional Notes:

## 2020-04-25 NOTE — H&P (Signed)
TRH H&P   Patient Demographics:    [name], is a 54 y.o. male  MRN: [id number]   DOB - 03-14-1966  Admit Date - 04/25/2020  Outpatient Primary MD for the patient is Soyla Dryer, PA-C  Referring MD/NP/PA: PA Idol  Patient coming from: Home  Chief Complaint  Patient presents with  . Dizziness      HPI:    [name]  is a 54 y.o. 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 -In ED MRI brain with no acute CVA, his blood pressure was uncontrolled despite taking his clonidine, urine drug screen was positive for THC and benzodiazepine, ED physician discussed with neurology who recommended to admit for TIA work-up.   Review of systems:    In addition to the HPI above,  No Fever-chills, No Headache, No changes with Vision or hearing, No problems swallowing food or Liquids, He reports chest pain, denies cough or Shortness of Breath, No Abdominal pain, No Nausea or Vommitting, Bowel movements are regular, No Blood in stool or Urine, No dysuria, No new skin rashes or  bruises, No new joints pains-aches,  Complains of worsening light side tingling numbness No recent weight gain or loss, No polyuria, polydypsia or polyphagia, No significant Mental Stressors.  A full 10 point Review of Systems was done, except as stated above, all other Review of Systems were negative.   With Past History of the following :    Past Medical History:  Diagnosis Date  . Acute pancreatitis   . Anxiety   . Arthritis   . Cancer ([place])    Adenoma in colon  . Cannabis abuse   . COPD (chronic obstructive pulmonary disease) ([place])   . Depression   . Diverticulitis of colon with perforation 08/2010  . Dyspnea   . GERD (gastroesophageal reflux disease)   . History of hiatal hernia   . Hypertension    taken off lisinopril by renal md 08/2011  . Renal insufficiency    follwed by Dr. Hinda Lenis  . Sleep apnea    does not use CPAP, cannot tolerate  Past Surgical History:  Procedure Laterality Date  . APPENDECTOMY    . BIOPSY  12/14/2017   Procedure: BIOPSY;  Surgeon: Daneil Dolin, MD;  [facility];  Service: Endoscopy;;  gastric   . BIOPSY  10/13/2019   Procedure: BIOPSY;  Surgeon: Daneil Dolin, MD;  [facility];  Service: Endoscopy;;  esophagus  . clamp left in colon after abd surgery    . COLON SURGERY  05/2011   [facility]. exp laparotomy with colostomy takedown, lysis of adhesions (3hours), loop ileostomy. complicated by abscess formation requiring percutaneous drainage  . colonoscopy via stoma with endoscopy of [name]  04/2011   [facility]. two sessile polyps in trv colon. complete resection but only partial retrieval. congested mucosa in area 3cm proximal to stoma, erythematous mucosa in Hartmann pouch. repeat tcs 8/2017path-adenomatous polyp  . COLONOSCOPY WITH PROPOFOL N/A 12/14/2017   Dr. Gala Romney: 2 4 to 6 mm polyps in the cecum, tubular adenomas, single rectal polyp tubular adenoma, nonbleeding internal hemorrhoids.  Appears to be a  surgical clip at the appendiceal orifice.  Next colonoscopy in 5 years  . ESOPHAGOGASTRODUODENOSCOPY  03/05/2012   erosive reflux esophagitis, bulbar erosions, gastric erosions/petichae, bx showed gastritis, no H.Pylori. Procedure: ESOPHAGOGASTRODUODENOSCOPY (EGD);  Surgeon: Daneil Dolin, MD;  [facility];  Service: Endoscopy;  Laterality: N/A;  11:30  . ESOPHAGOGASTRODUODENOSCOPY (EGD) WITH PROPOFOL N/A 12/14/2017   Dr. Gala Romney: Erosive reflux esophagitis with questionable mild stricturing/non-obstruction at this level status post dilation.  multiple gastric erosions in the stomach biopsy showed reactive gastropathy but no H. pylori.  . ESOPHAGOGASTRODUODENOSCOPY (EGD) WITH PROPOFOL N/A 10/13/2019   negative Barrett's, normal stomach  . HERNIA REPAIR    . KNEE SURGERY Right 2006  . MALONEY DILATION N/A 12/14/2017   Procedure: Venia Minks DILATION;  Surgeon: Daneil Dolin, MD;  [facility];  Service: Endoscopy;  Laterality: N/A;  . MULTIPLE EXTRACTIONS WITH ALVEOLOPLASTY  02/23/2012   Procedure: MULTIPLE EXTRACION WITH ALVEOLOPLASTY;  Surgeon: Gae Bon, DDS;  Location: [place];  Service: Oral Surgery;  Laterality: Bilateral;  Removal of left mandibular torus, multiple extractions with alveoloplasty  . POLYPECTOMY  12/14/2017   Procedure: POLYPECTOMY;  Surgeon: Daneil Dolin, MD;  [facility];  Service: Endoscopy;;  . reversal of colostomy  10/2011   per patient, did not have report  . right knee surgery    . sigmoid colectomy with end colostomy, Hartmann's pouch  08/2010   [facility]. complicated diverticulitis  . UMBILICAL HERNIA REPAIR        Social History:     Social History   Tobacco Use  . Smoking status: Current Every Day Smoker    Packs/day: 1.00    Years: 37.00    Pack years: 37.00    Types: Cigarettes  . Smokeless tobacco: Current User    Types: Snuff  . Tobacco comment: dips some trying to quit smoking  Substance Use Topics  . Alcohol use:  Not Currently      Family History :     Family History  Problem Relation Age of Onset  . Stomach cancer Father   . Lung cancer Father   . Throat cancer Father   . Cancer Father        lung and throat cancer  . Hypertension Father   . Liver disease Cousin        cirrhosis, etoh  . Throat cancer Paternal Uncle   . Lung cancer Paternal  Uncle   . Colon cancer Cousin 12  . Heart disease Mother   . Hypertension Mother   . Colon cancer Maternal Uncle   . Throat cancer Paternal Uncle   . Lung cancer Paternal Uncle   . Throat cancer Paternal Uncle   . Lung cancer Paternal Uncle       Home Medications:   Prior to Admission medications   Medication Sig Start Date End Date Taking? Authorizing Provider  albuterol (PROVENTIL HFA;VENTOLIN HFA) 108 (90 Base) MCG/ACT inhaler Inhale 2 puffs into the lungs every 6 (six) hours as needed for wheezing or shortness of breath.    [provider]  busPIRone (BUSPAR) 10 MG tablet Take 10 mg by mouth 3 (three) times daily.    [provider]  cloNIDine (CATAPRES) 0.1 MG tablet Take 2 tablets (0.2 mg total) by mouth 2 (two) times daily. Patient taking differently: Take 0.2 mg by mouth 3 (three) times daily.  10/10/19   Soyla Dryer, PA-C  cyclobenzaprine (FLEXERIL) 10 MG tablet Take 10 mg by mouth 3 (three) times daily as needed for muscle spasms.    [provider]  dicyclomine (BENTYL) 10 MG capsule Take 1 capsule (10 mg total) by mouth 3 (three) times daily as needed for spasms (or diarrhea). 12/06/19   Carlis Stable, NP  HYDROcodone-acetaminophen (NORCO/VICODIN) 5-325 MG tablet Take 1-2 tablets by mouth every 4 (four) hours as needed for moderate pain or severe pain. 10/12/19   Carlis Stable, NP  hydrocortisone (ANUSOL-HC) 2.5 % rectal cream Place 1 application rectally 2 (two) times daily. 12/22/19   Annitta Needs, NP  mometasone-formoterol (DULERA) 100-5 MCG/ACT AERO Inhale 2 puffs into the lungs 2 (two) times daily.     [provider]  ondansetron (ZOFRAN ODT) 4 MG disintegrating tablet Take 1 tablet (4 mg total) by mouth every 8 (eight) hours as needed for nausea or vomiting. 10/08/19   Idol, Almyra Free, PA-C  ondansetron (ZOFRAN) 4 MG tablet Take 4 mg by mouth every 6 (six) hours as needed. 05/14/19   [provider]  pantoprazole (PROTONIX) 40 MG tablet Take 1 tablet (40 mg total) by mouth daily. Patient taking differently: Take 40 mg by mouth 2 (two) times daily.  09/08/19   Carlis Stable, NP     Allergies:     Allergies  Allergen Reactions  . Codeine Itching and Nausea And Vomiting  . Latex Other (See Comments)    Turns skin red.   . Lisinopril     Kidney failure  . Morphine Hives  . Adhesive [Tape] Rash     Physical Exam:   Vitals  Blood pressure (!) 151/116, pulse 61, temperature 98.4 F (36.9 C), temperature source Oral, resp. rate 10, height 5\' 7"  (1.702 m), weight 84.6 kg, SpO2 97 %.   1. General well developed male lying in bed in NAD,    2. Normal affect and insight, Not Suicidal or Homicidal, Awake Alert, Oriented X 3.  3. No F.N deficits, ALL C.Nerves Intact, Strength 5/5 all 4 extremities, Sensation intact all 4 extremities, Plantars down going.  4. Ears and Eyes appear Normal, Conjunctivae clear, PERRLA. Moist Oral Mucosa.  5. Supple Neck, No JVD, No cervical lymphadenopathy appriciated, No Carotid Bruits.  6. Symmetrical Chest wall movement, Good air movement bilaterally, CTAB.  7. RRR, No Gallops, Rubs or Murmurs, No Parasternal Heave.  8. Positive Bowel Sounds, Abdomen Soft, No tenderness, No organomegaly appriciated,No rebound -guarding or rigidity.  9.  No  Cyanosis, Normal Skin Turgor, No Skin Rash or Bruise.  10. Good muscle tone,  joints appear normal , no effusions, Normal ROM.  11. No Palpable Lymph Nodes in Neck or Axillae   Data Review:    CBC Recent Labs  Lab 04/25/20 1242  WBC 7.4  HGB 14.1  HCT 43.7  PLT 180  MCV 94.0  MCH 30.3   MCHC 32.3  RDW 12.7  LYMPHSABS 2.9  MONOABS 0.6  EOSABS 0.2  BASOSABS 0.0   ------------------------------------------------------------------------------------------------------------------  Chemistries  Recent Labs  Lab 04/25/20 1242  NA 137  K 4.2  CL 103  CO2 26  GLUCOSE 87  BUN 17  CREATININE 1.25*  CALCIUM 8.7*  AST 17  ALT 18  ALKPHOS 74  BILITOT 0.4   ------------------------------------------------------------------------------------------------------------------ estimated creatinine clearance is 70.2 mL/min (A) (by C-G formula based on SCr of 1.25 mg/dL (H)). ------------------------------------------------------------------------------------------------------------------ No results for input(s): TSH, T4TOTAL, T3FREE, THYROIDAB in the last 72 hours.  Invalid input(s): FREET3  Coagulation profile Recent Labs  Lab 04/25/20 1242  INR 1.0   ------------------------------------------------------------------------------------------------------------------- No results for input(s): DDIMER in the last 72 hours. -------------------------------------------------------------------------------------------------------------------  Cardiac Enzymes No results for input(s): CKMB, TROPONINI, MYOGLOBIN in the last 168 hours.  Invalid input(s): CK ------------------------------------------------------------------------------------------------------------------ No results found for: BNP   ---------------------------------------------------------------------------------------------------------------  Urinalysis    Component Value Date/Time   COLORURINE YELLOW 04/25/2020 [place] 04/25/2020 1249   LABSPEC 1.018 04/25/2020 1249   PHURINE 5.0 04/25/2020 1249   GLUCOSEU NEGATIVE 04/25/2020 1249   HGBUR NEGATIVE 04/25/2020 1249   BILIRUBINUR NEGATIVE 04/25/2020 1249   KETONESUR NEGATIVE 04/25/2020 1249   PROTEINUR NEGATIVE 04/25/2020 1249    UROBILINOGEN 0.2 05/06/2012 1539   NITRITE NEGATIVE 04/25/2020 1249   LEUKOCYTESUR NEGATIVE 04/25/2020 1249    ----------------------------------------------------------------------------------------------------------------   Imaging Results:    CT Head Wo Contrast  Result Date: 04/25/2020 CLINICAL DATA:  Headache rule out intracranial hemorrhage EXAM: CT HEAD WITHOUT CONTRAST TECHNIQUE: Contiguous axial images were obtained from the base of the skull through the vertex without intravenous contrast. COMPARISON:  CT head 07/14/2019 FINDINGS: Brain: No evidence of acute infarction, hemorrhage, hydrocephalus, extra-axial collection or mass lesion/mass effect. Vascular: Negative for hyperdense vessel Skull: Negative Sinuses/Orbits: Paranasal sinuses clear.  Negative orbit Other: None IMPRESSION: Negative CT head Electronically Signed   By: Franchot Gallo M.D.   On: 04/25/2020 14:33   MR BRAIN WO CONTRAST  Result Date: 04/25/2020 CLINICAL DATA:  54 year old male with sudden onset headache, presyncope. EXAM: MRI HEAD WITHOUT CONTRAST TECHNIQUE: Multiplanar, multiecho pulse sequences of the brain and surrounding structures were obtained without intravenous contrast. COMPARISON:  Head CT earlier today. Report of prior brain MRI [facility] 04/05/2007 (no images available). FINDINGS: The examination was degraded by motion artifact and had to be discontinued prior to completion by patient request. No diagnostic sagittal T1 weighted imaging or axial T2 imaging was obtained. Brain: No restricted diffusion to suggest acute infarction. No midline shift, mass effect, evidence of mass lesion, ventriculomegaly, extra-axial collection or acute intracranial hemorrhage. Cervicomedullary junction and pituitary are within normal limits. Pearline Cables and white matter signal is within normal limits for age throughout the brain. No cortical encephalomalacia or chronic cerebral blood products identified. Vascular: Judging  from coronal T2 imaging the major vascular flow voids at the skull base are preserved. Skull and upper cervical spine: Grossly normal skull. Cervical spine not evaluated. Sinuses/Orbits: Orbits appear negative. Paranasal sinuses and mastoids are stable and well pneumatized. Other: Grossly normal  visible internal auditory structures. Scalp and face appear negative. IMPRESSION: 1. Discontinued exam prior to completion, no diagnostic sagittal T1 or axial T2 imaging could be obtained. 2. No acute intracranial abnormality, normal for age noncontrast MRI appearance of the brain. Electronically Signed   By: Genevie Ann M.D.   On: 04/25/2020 16:37   DG Chest Portable 1 View  Result Date: 04/25/2020 CLINICAL DATA:  Acute onset headache and near syncope today after bending over. EXAM: PORTABLE CHEST 1 VIEW COMPARISON: Single-view of the chest 10/08/2019. FINDINGS: Lungs clear. Heart size normal. No pneumothorax or pleural fluid. No acute or focal bony abnormality. IMPRESSION: No acute disease. Electronically Signed   By: Inge Rise M.D.   On: 04/25/2020 14:58    My personal review of EKG: Rhythm NSR, Rate 68/min, QTc 426 , ST with early repolarization   Assessment & Plan:    Active Problems:   Cannabis abuse   HTN (hypertension)   GERD (gastroesophageal reflux disease)   Chronic obstructive pulmonary disease (HCC)   Focal neurological deficit     right side tingling numbness/near syncope -Patient has right hand tingling numbness for years now, most likely related to cervical radiculopathy, but he has new right lower extremity and right upper extremity tingling numbness.  Very likely related to worsening radiculopathy. -ED discussed with neurology who recommended admission for TIA rule out, I will start on aspirin, he will be admitted under TIA pathway, MRI brain rule out acute CVA, will obtain carotid Dopplers, MRA head, 2D echo and monitor him on telemetry, will check A1c and blood panel.  Chest  pain -EKG nonacute, troponins negative x2, will monitor on telemetry and obtain 2D echo.  Hypertension -Uncontrolled on clonidine, will add low-dose Norvasc and adjust as needed.  Tobacco abuse  -He is counseled  THC abuse -Reports he is only using 2-3 times a week.   -History of COPD continue with and as needed albuterol  DVT Prophylaxis Heparin -SCDs  AM Labs Ordered, also please review Full Orders  Family Communication: Admission, patients condition and plan of care including tests being ordered have been discussed with the patient  who indicate understanding and agree with the plan and Code Status.  Code Status Full  Likely DC to  Home  Condition GUARDED    Consults called: neuro by ED  Admission status:  observation  Time spent in minutes : 50 minutes   Phillips Climes M.D on 04/25/2020 at 6:07 PM   [facility] - Office  [phone]

## 2020-04-26 ENCOUNTER — Observation Stay (HOSPITAL_COMMUNITY): Payer: Self-pay

## 2020-04-26 ENCOUNTER — Observation Stay (HOSPITAL_BASED_OUTPATIENT_CLINIC_OR_DEPARTMENT_OTHER): Payer: Self-pay

## 2020-04-26 DIAGNOSIS — I10 Essential (primary) hypertension: Secondary | ICD-10-CM

## 2020-04-26 DIAGNOSIS — M5412 Radiculopathy, cervical region: Secondary | ICD-10-CM

## 2020-04-26 DIAGNOSIS — E785 Hyperlipidemia, unspecified: Secondary | ICD-10-CM

## 2020-04-26 DIAGNOSIS — Z720 Tobacco use: Secondary | ICD-10-CM

## 2020-04-26 DIAGNOSIS — G459 Transient cerebral ischemic attack, unspecified: Secondary | ICD-10-CM

## 2020-04-26 DIAGNOSIS — K219 Gastro-esophageal reflux disease without esophagitis: Secondary | ICD-10-CM

## 2020-04-26 DIAGNOSIS — J449 Chronic obstructive pulmonary disease, unspecified: Secondary | ICD-10-CM

## 2020-04-26 LAB — ECHOCARDIOGRAM COMPLETE
Area-P 1/2: 3.12 cm2
Height: 67 in
S' Lateral: 2.16 cm
Weight: 2984.15 oz

## 2020-04-26 LAB — HEMOGLOBIN A1C
Hgb A1c MFr Bld: 5.5 % (ref 4.8–5.6)
Mean Plasma Glucose: 111.15 mg/dL

## 2020-04-26 LAB — LIPID PANEL
Cholesterol: 196 mg/dL (ref 0–200)
HDL: 39 mg/dL — ABNORMAL LOW (ref >40)
LDL Cholesterol: 126 mg/dL — ABNORMAL HIGH (ref 0–99)
Total CHOL/HDL Ratio: 5 RATIO
Triglycerides: 153 mg/dL — ABNORMAL HIGH (ref <150)
VLDL: 31 mg/dL (ref 0–40)

## 2020-04-26 LAB — TSH: TSH: 3.252 u[IU]/mL (ref 0.350–4.500)

## 2020-04-26 LAB — HIV ANTIBODY (ROUTINE TESTING W REFLEX): HIV Screen 4th Generation wRfx: NONREACTIVE

## 2020-04-26 LAB — VITAMIN B12: Vitamin B-12: 189 pg/mL (ref 180–914)

## 2020-04-26 MED ORDER — ASPIRIN 81 MG PO TBEC
81.0000 mg | DELAYED_RELEASE_TABLET | Freq: Every day | ORAL | 11 refills | Status: DC
Start: 2020-04-27 — End: 2022-05-28

## 2020-04-26 MED ORDER — PRAVASTATIN SODIUM 40 MG PO TABS: 40.0000 mg | ORAL_TABLET | Freq: Every evening | ORAL | 4 refills | Status: DC

## 2020-04-26 MED ORDER — CLONIDINE HCL 0.1 MG PO TABS: 0.2000 mg | ORAL_TABLET | Freq: Three times a day (TID) | ORAL | 1 refills | Status: DC

## 2020-04-26 MED ORDER — GABAPENTIN 100 MG PO CAPS: 100.0000 mg | ORAL_CAPSULE | Freq: Three times a day (TID) | ORAL | 2 refills | Status: DC

## 2020-04-26 MED ORDER — PANTOPRAZOLE SODIUM 40 MG PO TBEC: 40.0000 mg | DELAYED_RELEASE_TABLET | Freq: Two times a day (BID) | ORAL | 1 refills | Status: DC

## 2020-04-26 MED ORDER — ATORVASTATIN CALCIUM 40 MG PO TABS
40.0000 mg | ORAL_TABLET | Freq: Every day | ORAL | Status: DC
  Administered 2020-04-26: 40 mg via ORAL
  Filled 2020-04-26: qty 1

## 2020-04-26 MED ORDER — AMLODIPINE BESYLATE 5 MG PO TABS
5.0000 mg | ORAL_TABLET | Freq: Every day | ORAL | 1 refills | Status: DC
Start: 2020-04-27 — End: 2020-06-21

## 2020-04-26 NOTE — Evaluation (Signed)
Physical Therapy Evaluation Patient Details Name: [name] MRN: [id number] DOB: 05-11-1966 Today's Date: 04/26/2020   History of Present Illness  [name]  is a 54 y.o. 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, under  the sun and hot weather for last week , where he reports he came very nauseated, where he developed chest pain, but it did resolve at home at rest, port but he kept having intermittent dizziness, room spinning, lightheadedness, he reports some confusion in different times, he does report tingling and numbness in the right arm, and right lower extremity which is intermittent for last week, he does report chronic hand tingling and numbness, but not the entire arm or lower extremity, he does report near syncope as well which prompted him to come to ED.-In ED MRI brain with no acute CVA, his blood pressure was uncontrolled despite taking his clonidine, urine drug screen was positive for THC and benzodiazepine, ED physician discussed with neurology who recommended to admit for TIA work-up.    Clinical Impression  The patient was able to perform all bed mobility, transfers, and ambulation of 120 feet today without lightheadedness, syncope, or LOB. The patient reported increased pain in RLE, of which outpatient physical therapy would be appropriate due to his independence (although gait abnormalities noted) in ambulation today. The patient tolerated sitting up in chair with call bell in arm's reach after therapy today. The patient will be discharged from physical therapy to nursing for ambulation as tolerated at the venue listed below for length of stay.     Follow Up Recommendations Outpatient PT    Equipment Recommendations     None recommended by PT.    Recommendations for Other Services   None recommended by PT.     Precautions / Restrictions Precautions Precautions: Fall Restrictions Weight Bearing Restrictions: No      Mobility  Bed Mobility Overal bed mobility: Independent      Transfers Overall transfer level: Independent        Ambulation/Gait Ambulation/Gait assistance: Modified independent (Device/Increase time) Gait Distance (Feet): 120 Feet   Gait Pattern/deviations: WFL(Within Functional Limits);Decreased step length - left;Decreased weight shift to right;Wide base of support Gait velocity: community ambulator   General Gait Details: pt able to ambulate w/o LOB, however gait pattern is uneven favoring LLE  Stairs     Wheelchair Mobility    Modified Rankin (Stroke Patients Only)       Balance Overall balance assessment: Independent            Pertinent Vitals/Pain Pain Assessment: 0-10 Pain Score: 6  Pain Location: RLE Pain Descriptors / Indicators: Sharp;Shooting;Discomfort Pain Intervention(s): Limited activity within patient's tolerance;Monitored during session    [name] expects to be discharged to:: Private residence Living Arrangements: Alone Available Help at Discharge: Family;Available PRN/intermittently Type of Home: House Home Access: Stairs to enter Entrance Stairs-Rails: Can reach both Entrance Stairs-Number of Steps: 6 Home Layout: One level Home Equipment: None      Prior Function Level of Independence: Independent         Comments: history of R UE/LE pain possibly due to radiculopathy     Hand Dominance   Dominant Hand: Right    Extremity/Trunk Assessment   Upper Extremity Assessment Upper Extremity Assessment: Defer to OT evaluation  Lower Extremity Assessment Lower Extremity Assessment: RLE deficits/detail;Overall WFL for tasks assessed RLE Deficits / Details: increased pain upon MMT RLE RLE  Sensation: WNL    Cervical / Trunk Assessment Cervical / Trunk Assessment: Normal  Communication   Communication: No difficulties  Cognition Arousal/Alertness: Awake/alert Behavior During Therapy: WFL for tasks assessed/performed Overall Cognitive Status: Within Functional Limits for tasks assessed         General Comments      Exercises     Assessment/Plan    PT Assessment Patent does not need any further PT services  PT Problem List         PT Treatment Interventions      PT Goals (Current goals can be found in the Care Plan section)  Acute Rehab PT Goals Patient Stated Goal: go home PT Goal Formulation: With patient Time For Goal Achievement: 04/30/20 Potential to Achieve Goals: Good    Frequency     Barriers to discharge        Co-evaluation       AM-PAC PT "6 Clicks" Mobility  Outcome Measure Help needed turning from your back to your side while in a flat bed without using bedrails?: None Help needed moving from lying on your back to sitting on the side of a flat bed without using bedrails?: None Help needed moving to and from a bed to a chair (including a wheelchair)?: None Help needed standing up from a chair using your arms (e.g., wheelchair or bedside chair)?: None Help needed to walk in hospital room?: None Help needed climbing 3-5 steps with a railing? : A Little 6 Click Score: 23    End of Session   Activity Tolerance: Patient tolerated treatment well Patient left: in chair;with call bell/phone within reach;with nursing/sitter in room Nurse Communication: Mobility status PT Visit Diagnosis: Unsteadiness on feet (R26.81);Other abnormalities of gait and mobility (R26.89);History of falling (Z91.81);Muscle weakness (generalized) (M62.81);Pain Pain - Right/Left: Right Pain - part of body: Leg    Time: 4628-6381 PT Time Calculation (min) (ACUTE ONLY): 20 min   Charges:   PT Evaluation $PT Eval Low Complexity: 1 Low PT  Treatments $Therapeutic Activity: 8-22 mins       1:05pm, 04/26/20 Karlyn Agee, SPT Physical Therapy with [facility]  [phone] office  This qualified practitioner was present in the room guiding the student in service delivery. Therapy student was participating in the provision of services, and the practitioner was not engaged in treating another patient or doing other tasks at the same time.   Floria Raveling. Hartnett-Rands, MS, PT [place] [phone] 04/26/2020, 1:01 PM

## 2020-04-26 NOTE — Progress Notes (Signed)
*  PRELIMINARY RESULTS* Echocardiogram 2D Echocardiogram has been performed.  Leavy Cella 04/26/2020, 8:56 AM

## 2020-04-26 NOTE — TOC Transition Note (Signed)
Transition of Care [facility]) - CM/SW Discharge Note   Patient Details  Name: [name] MRN: [id number] Date of Birth: 06/14/1966  Transition of Care [facility]) CM/SW Contact:  Shade Flood, LCSW Phone Number: 04/26/2020, 2:19 PM   Clinical Narrative:     Pt here observation status and stable for dc today per MD. PT recommending outpatient therapy at dc. Also received consult for TOC to follow up with pt about SA treatment. Met with pt today to assess. Pt aware of PT recommendations and he states that he does not want outpatient therapy. Per pt, MD told him he did not need follow up therapy. Discussed referral to [facility] as pt does not have insurance. Pt stated that he has been seen at [facility] and is familiar with how to follow up there. Pt reported that MD was going to provide information on neurology follow up. Discussed with Dr. Dyann Kief who stated that this would be included on pt's AVS. Dr. Dyann Kief stated that pt needs neurosurgery follow up as well but that he reportedly cannot afford the co-pay. Explained that [facility] team may be able to help with this referral and expense. Pt is encouraged to follow up there for assistance.   Offered SA treatment resources to pt who stated that he did not feel like he needed them at this time. Encouraged smoking cessation due to TIA and CVA risk.  No other TOC needs identified for dc.  Expected Discharge Plan: Home/Self Care Barriers to Discharge: Barriers Resolved   Patient Goals and CMS Choice        Expected Discharge Plan and Services Expected Discharge Plan: Home/Self Care       Living arrangements for the past 2 months: Single Family Home Expected Discharge Date: 04/26/20                                    Prior Living Arrangements/Services Living arrangements for the past 2 months: Single Family Home Lives with:: Self Patient language and need for interpreter reviewed:: Yes Do you feel safe  going back to the place where you live?: Yes      Need for Family Participation in Patient Care: No (Comment) Care giver support system in place?: No (comment)   Criminal Activity/Legal Involvement Pertinent to Current Situation/Hospitalization: No - Comment as needed  Activities of Daily Living Home Assistive Devices/Equipment: CPAP, Dentures (specify type), Eyeglasses ADL Screening (condition at time of admission) Patient's cognitive ability adequate to safely complete daily activities?: Yes Is the patient deaf or have difficulty hearing?: No Does the patient have difficulty seeing, even when wearing glasses/contacts?: No Does the patient have difficulty concentrating, remembering, or making decisions?: No Patient able to express need for assistance with ADLs?: Yes Does the patient have difficulty dressing or bathing?: No Independently performs ADLs?: Yes (appropriate for developmental age) Does the patient have difficulty walking or climbing stairs?: No Weakness of Legs: None Weakness of Arms/Hands: None  Permission Sought/Granted                  Emotional Assessment Appearance:: Appears stated age Attitude/Demeanor/Rapport: Engaged Affect (typically observed): Pleasant Orientation: : Oriented to Self, Oriented to Place, Oriented to  Time, Oriented to Situation Alcohol / Substance Use: Tobacco Use Psych Involvement: No (comment)  Admission diagnosis:  Near syncope [R55] Focal neurological deficit [R29.818] History of weakness of extremity [Z87.39] Acute nonintractable headache, unspecified headache  type [R51.9] Chest pain, unspecified type [R07.9] Patient Active Problem List   Diagnosis Date Noted  . TIA (transient ischemic attack)   . Cervical radiculopathy   . Tobacco abuse   . Hyperlipidemia   . Focal neurological deficit 04/25/2020  . Anal fissure 12/22/2019  . Abdominal pain 12/06/2019  . Internal hemorrhoids 12/06/2019  . Acute esophagitis 10/12/2019  .  Black stools 10/12/2019  . Diarrhea 10/12/2019  . 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 Chronic obstructive pulmonary disease ([place]) 12/14/2017  . Esophageal dysphagia 09/24/2017  . History of reversal of ileostomy 09/24/2017  . History of colostomy reversal 09/24/2017  . History of colonic polyps 09/24/2017  . Head ache 05/06/2012  . Chronic abdominal pain 05/06/2012  . Vomiting 05/06/2012  . GERD (gastroesophageal reflux disease) 03/15/2012  . Gastritis and duodenitis 03/15/2012  . Chronic generalized abdominal pain 02/10/2012  . Epigastric pain 02/10/2012  . Nausea 02/10/2012  . Diverticulitis of colon with perforation 02/10/2012  . ANXIETY 10/05/2009  . Cannabis abuse 10/05/2009  . HTN (hypertension) 10/05/2009  . ACUTE PANCREATITIS 10/05/2009  . HEMATEMESIS 10/05/2009  . HEMATOCHEZIA 10/05/2009   PCP:  Soyla Dryer, PA-C Pharmacy:   [facility] [address], [place] [address] Phone: [phone] Fax: [phone]     Social Determinants of Health (SDOH) Interventions    Readmission Risk Interventions No flowsheet data found.    Final next level of care: Home/Self Care Barriers to Discharge: Barriers Resolved   Patient Goals and CMS Choice        Discharge Placement                       Discharge Plan and Services                                     Social Determinants of Health (SDOH) Interventions     Readmission Risk Interventions No flowsheet data found.

## 2020-04-26 NOTE — Plan of Care (Signed)
  Problem: Education: Goal: Knowledge of General Education information will improve Description: Including pain rating scale, medication(s)/side effects and non-pharmacologic comfort measures Outcome: Adequate for Discharge   Problem: Health Behavior/Discharge Planning: Goal: Ability to manage health-related needs will improve Outcome: Adequate for Discharge   Problem: Clinical Measurements: Goal: Ability to maintain clinical measurements within normal limits will improve Outcome: Adequate for Discharge Goal: Will remain free from infection Outcome: Adequate for Discharge Goal: Diagnostic test results will improve Outcome: Adequate for Discharge Goal: Respiratory complications will improve Outcome: Adequate for Discharge Goal: Cardiovascular complication will be avoided Outcome: Adequate for Discharge   Problem: Activity: Goal: Risk for activity intolerance will decrease Outcome: Adequate for Discharge   Problem: Nutrition: Goal: Adequate nutrition will be maintained Outcome: Adequate for Discharge   Problem: Coping: Goal: Level of anxiety will decrease Outcome: Adequate for Discharge   Problem: Elimination: Goal: Will not experience complications related to bowel motility Outcome: Adequate for Discharge Goal: Will not experience complications related to urinary retention Outcome: Adequate for Discharge   Problem: Pain Managment: Goal: General experience of comfort will improve Outcome: Adequate for Discharge   Problem: Safety: Goal: Ability to remain free from injury will improve Outcome: Adequate for Discharge   Problem: Skin Integrity: Goal: Risk for impaired skin integrity will decrease Outcome: Adequate for Discharge   Problem: Education: Goal: Knowledge of General Education information will improve Description: Including pain rating scale, medication(s)/side effects and non-pharmacologic comfort measures Outcome: Adequate for Discharge   Problem: Health  Behavior/Discharge Planning: Goal: Ability to manage health-related needs will improve Outcome: Adequate for Discharge   Problem: Clinical Measurements: Goal: Ability to maintain clinical measurements within normal limits will improve Outcome: Adequate for Discharge Goal: Will remain free from infection Outcome: Adequate for Discharge Goal: Diagnostic test results will improve Outcome: Adequate for Discharge Goal: Respiratory complications will improve Outcome: Adequate for Discharge Goal: Cardiovascular complication will be avoided Outcome: Adequate for Discharge   Problem: Activity: Goal: Risk for activity intolerance will decrease Outcome: Adequate for Discharge   Problem: Nutrition: Goal: Adequate nutrition will be maintained Outcome: Adequate for Discharge   Problem: Coping: Goal: Level of anxiety will decrease Outcome: Adequate for Discharge   Problem: Elimination: Goal: Will not experience complications related to bowel motility Outcome: Adequate for Discharge Goal: Will not experience complications related to urinary retention Outcome: Adequate for Discharge   Problem: Pain Managment: Goal: General experience of comfort will improve Outcome: Adequate for Discharge   Problem: Safety: Goal: Ability to remain free from injury will improve Outcome: Adequate for Discharge   Problem: Skin Integrity: Goal: Risk for impaired skin integrity will decrease Outcome: Adequate for Discharge   Problem: Education: Goal: Knowledge of secondary prevention will improve Outcome: Adequate for Discharge Goal: Knowledge of patient specific risk factors addressed and post discharge goals established will improve Outcome: Adequate for Discharge

## 2020-04-26 NOTE — Progress Notes (Signed)
SLP Cancellation Note  Patient Details Name: [name] MRN: [id number] DOB: 04-Jul-1966   Cancelled treatment:       Reason Eval/Treat Not Completed: SLP screened, no needs identified, will sign off; SLP screened Pt in room. Pt denies any changes in swallowing, speech, language, or cognition. MRI negative for acute changes. SLE will be deferred at this time. Reconsult if indicated. SLP will sign off.  Thank you,  Genene Churn, [place]    [place] 04/26/2020, 1:18 PM

## 2020-04-26 NOTE — Discharge Summary (Signed)
Physician Discharge Summary  MALE [name]:[id number] DOB: 1966-03-04 DOA: 04/25/2020  PCP: Soyla Dryer, PA-C  Admit date: 04/25/2020 Discharge date: 04/26/2020  Time spent: 35 minutes  Recommendations for Outpatient Follow-up:  1. Repeat basic metabolic panel to follow electrolytes renal function 2. Reassess blood pressure and further adjust antihypertensive regimen as needed 3. In 6-8 weeks repeat complete metabolic panel and lipid panel to follow LFTs/cholesterol level as patient has been started on statins.   Discharge Diagnoses:  Active Problems:   Cannabis abuse   HTN (hypertension)   GERD (gastroesophageal reflux disease)   Chronic obstructive pulmonary disease (HCC)   Focal neurological deficit   TIA (transient ischemic attack)   Cervical radiculopathy   Tobacco abuse   Hyperlipidemia   Discharge Condition: Stable and improved.  Patient discharged home with instruction to follow-up with PCP and neurology as an outpatient.  CODE STATUS: Full code.  Diet recommendation: Heart healthy diet.  Filed Weights   04/25/20 1236  Weight: 84.6 kg    History of present illness:  As per H&P written by Dr. Waldron Labs on 04/25/2020  [name]  is a 54 y.o. 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 -In ED MRI brain with no acute CVA, his blood pressure was uncontrolled despite taking his clonidine, urine drug screen was positive for THC and benzodiazepine, ED physician discussed with neurology who recommended to admit for TIA work-up.  Hospital Course:  1-right-sided tingling/numbness: With concerns for TIA. -Significant risk factors: Including tobacco abuse, hypertension, hyperlipidemia and age. -Work-up negative for acute stroke -2D echo without signs of acute thrombi or abnormality -Continue risk factor modification -Aspirin for secondary prevention and initiation of a statins -Outpatient follow-up with Dr. Merlene Laughter has been recommended.  2-tobacco abuse -Cessation counseling provided -Discussed with patient the use of over-the-counter nicotine patch to assist him with quitting process.  3-essential hypertension -Stable overall and allowing permissive hypertension over the next 5-7 days -Advised to follow heart healthy diet -Continue antihypertensive regimen and further adjust as needed.  4-gastroesophageal flux disease -Continue PPI  5-history of COPD -No wheezing: -Smoking cessation counseling provided -Continue as needed bronchodilators.  6-cervical radiculopathy -Patient will require outpatient follow-up with neurosurgery -Low-dose Neurontin 3 times a day has been started following recommendations by neurology service.  Procedures:  See below for x-ray report  2D echo: 1. Left ventricular ejection fraction, by estimation, is 65 to 70%. The  left ventricle has normal function. The left ventricle has no regional  wall motion abnormalities. There is moderate left ventricular hypertrophy.  Left ventricular diastolic  parameters were normal.  2. Right ventricular systolic function is normal. The right ventricular  size is  normal. Tricuspid regurgitation signal is inadequate for assessing  PA pressure.  3. The mitral valve is grossly normal. Trivial mitral valve  regurgitation.  4. The aortic valve is tricuspid. Aortic valve regurgitation is not  visualized.  5. The inferior vena cava is normal in size with greater than 50%  respiratory variability, suggesting right atrial pressure of 3 mmHg.   Consultations:  Neurology  Discharge Exam: Vitals:   04/26/20 0745 04/26/20 0945  BP: (!) 125/93 (!) 142/94  Pulse: (!) 56 62  Resp: 17 16  Temp: 98 F (36.7 C) 97.8 F (36.6 C)  SpO2: 98% 97%    General: Afebrile, no chest pain, no nausea, no vomiting.  Oriented x3 and expressing significant improvement in his right-sided numbness/tingling. Cardiovascular: S1 and S2, no rubs, no gallops, no JVD. Respiratory: No requiring oxygen supplementation; good air movement bilaterally, no wheezing.  Positive scattered rhonchi.  No using accessory muscle.  Normal respiratory effort. Abdomen: Soft, nontender, distended, positive bowel sounds Extremities: No cyanosis or clubbing.  Discharge Instructions   Discharge Instructions    Diet - low sodium heart healthy   Complete by: As directed    Discharge instructions   Complete by: As directed    Stop smoking The medications are prescribed Arrange follow-up with PCP in 10 days Follow-up with neurology service in 4-6 weeks. Follow heart healthy diet. Maintain adequate hydration.   Increase activity slowly   Complete by: As directed      Allergies as of 04/26/2020      Reactions   Codeine Itching, Nausea And Vomiting   Latex Other (See Comments)   Turns skin red.    Lisinopril    Kidney failure   Morphine Hives   Adhesive [tape] Rash      Medication List    STOP taking these medications   hydrocortisone 2.5 % rectal cream Commonly known as: ANUSOL-HC   ondansetron 4 MG disintegrating tablet Commonly known as: Zofran ODT   ondansetron 4 MG  tablet Commonly known as: ZOFRAN     TAKE these medications   albuterol 108 (90 Base) MCG/ACT inhaler Commonly known as: VENTOLIN HFA Inhale 2 puffs into the lungs every 6 (six) hours as needed for wheezing or shortness of breath.   amLODipine 5 MG tablet Commonly known as: NORVASC Take 1 tablet (5 mg total) by mouth daily. Start taking on: April 27, 2020   aspirin 81 MG EC tablet Take 1 tablet (81 mg total) by mouth daily. Swallow whole. Start taking on: April 27, 2020   busPIRone 10 MG tablet Commonly known as: BUSPAR Take 10 mg by mouth 3 (three) times daily.   cloNIDine 0.1 MG tablet Commonly known as: CATAPRES Take 2 tablets (0.2 mg total) by mouth 3 (three) times daily.   cyclobenzaprine 10 MG tablet Commonly known as: FLEXERIL Take 10 mg by mouth 3 (three) times daily as needed for muscle spasms.   dicyclomine 10 MG capsule Commonly known as: BENTYL Take 1 capsule (10 mg total) by mouth 3 (three) times daily as needed for spasms (or diarrhea).   Dulera 100-5 MCG/ACT Aero Generic drug: mometasone-formoterol Inhale 2 puffs into the lungs 2 (two) times daily.   gabapentin 100 MG capsule Commonly known as: Neurontin Take 1 capsule (100 mg total) by mouth 3 (three) times daily.   pantoprazole 40 MG tablet Commonly known as: Protonix Take 1 tablet (40 mg total) by mouth 2 (two) times daily.   pravastatin 40 MG tablet Commonly known as: Pravachol Take 1 tablet (40 mg total) by mouth  every evening.      Allergies  Allergen Reactions  . Codeine Itching and Nausea And Vomiting  . Latex Other (See Comments)    Turns skin red.   . Lisinopril     Kidney failure  . Morphine Hives  . Adhesive [Tape] Rash    Follow-up Information    Soyla Dryer, PA-C. Schedule an appointment as soon as possible for a visit in 10 day(s).   Specialty: Physician Assistant Contact information: [address] [place] [address] [phone]        Phillips Odor,  MD. Schedule an appointment as soon as possible for a visit in 6 week(s).   Specialty: Neurology Contact information: [address] [place] [address] [phone]               The results of significant diagnostics from this hospitalization (including imaging, microbiology, ancillary and laboratory) are listed below for reference.    Significant Diagnostic Studies: CT Head Wo Contrast  Result Date: 04/25/2020 CLINICAL DATA:  Headache rule out intracranial hemorrhage EXAM: CT HEAD WITHOUT CONTRAST TECHNIQUE: Contiguous axial images were obtained from the base of the skull through the vertex without intravenous contrast. COMPARISON:  CT head 07/14/2019 FINDINGS: Brain: No evidence of acute infarction, hemorrhage, hydrocephalus, extra-axial collection or mass lesion/mass effect. Vascular: Negative for hyperdense vessel Skull: Negative Sinuses/Orbits: Paranasal sinuses clear.  Negative orbit Other: None IMPRESSION: Negative CT head Electronically Signed   By: Franchot Gallo M.D.   On: 04/25/2020 14:33   MR ANGIO HEAD WO CONTRAST  Result Date: 04/26/2020 CLINICAL DATA:  Transient ischemic attack. Sudden onset of headache and near syncope. Right arm weakness. EXAM: MRA HEAD WITHOUT CONTRAST TECHNIQUE: Angiographic images of the Circle of Willis were obtained using MRA technique without intravenous contrast. COMPARISON:  Brain MRI and head CT done yesterday. FINDINGS: Both internal carotid arteries are patent through the skull base and siphon regions. The anterior and middle cerebral vessels are patent without proximal stenosis, aneurysm or vascular malformation. No large or medium vessel occlusion. Left posterior cerebral artery takes fetal origin from the anterior circulation. Both vertebral arteries are patent to the basilar. No basilar stenosis. Posterior circulation branch vessels are patent. IMPRESSION: Negative intracranial MR angiography. No stenosis or occlusion. No aneurysm.  Electronically Signed   By: Nelson Chimes M.D.   On: 04/26/2020 10:37   MR BRAIN WO CONTRAST  Result Date: 04/25/2020 CLINICAL DATA:  54 year old male with sudden onset headache, presyncope. EXAM: MRI HEAD WITHOUT CONTRAST TECHNIQUE: Multiplanar, multiecho pulse sequences of the brain and surrounding structures were obtained without intravenous contrast. COMPARISON:  Head CT earlier today. Report of prior brain MRI [facility] 04/05/2007 (no images available). FINDINGS: The examination was degraded by motion artifact and had to be discontinued prior to completion by patient request. No diagnostic sagittal T1 weighted imaging or axial T2 imaging was obtained. Brain: No restricted diffusion to suggest acute infarction. No midline shift, mass effect, evidence of mass lesion, ventriculomegaly, extra-axial collection or acute intracranial hemorrhage. Cervicomedullary junction and pituitary are within normal limits. Pearline Cables and white matter signal is within normal limits for age throughout the brain. No cortical encephalomalacia or chronic cerebral blood products identified. Vascular: Judging from coronal T2 imaging the major vascular flow voids at the skull base are preserved. Skull and upper cervical spine: Grossly normal skull. Cervical spine not evaluated. Sinuses/Orbits: Orbits appear negative. Paranasal sinuses and mastoids are stable and well pneumatized. Other: Grossly normal visible internal auditory structures. Scalp  and face appear negative. IMPRESSION: 1. Discontinued exam prior to completion, no diagnostic sagittal T1 or axial T2 imaging could be obtained. 2. No acute intracranial abnormality, normal for age noncontrast MRI appearance of the brain. Electronically Signed   By: Genevie Ann M.D.   On: 04/25/2020 16:37   DG Chest Portable 1 View  Result Date: 04/25/2020 CLINICAL DATA:  Acute onset headache and near syncope today after bending over. EXAM: PORTABLE CHEST 1 VIEW COMPARISON: Single-view  of the chest 10/08/2019. FINDINGS: Lungs clear. Heart size normal. No pneumothorax or pleural fluid. No acute or focal bony abnormality. IMPRESSION: No acute disease. Electronically Signed   By: Inge Rise M.D.   On: 04/25/2020 14:58   ECHOCARDIOGRAM COMPLETE  Result Date: 04/26/2020    ECHOCARDIOGRAM REPORT   Patient Name:   PATRICE MOATES Date of Exam: 04/26/2020 Medical Rec #:  [id number]       Height:       67.0 in Accession #:    [id number]      Weight:       186.5 lb Date of Birth:  22-Oct-1965      BSA:          1.963 m Patient Age:    15 years        BP:           125/93 mmHg Patient Gender: M               HR:           56 bpm. Exam Location:  [facility] Procedure: 2D Echo Indications:    TIA 435.9 / G45.9  History:        Patient has no prior history of Echocardiogram examinations.                 COPD; Risk Factors:Hypertension and Current Smoker. GERD,                 Cannabis Use.  Sonographer:    Leavy Cella RDCS (AE) Referring Phys: [id number] [address] [place] IMPRESSIONS  1. Left ventricular ejection fraction, by estimation, is 65 to 70%. The left ventricle has normal function. The left ventricle has no regional wall motion abnormalities. There is moderate left ventricular hypertrophy. Left ventricular diastolic parameters were normal.  2. Right ventricular systolic function is normal. The right ventricular size is normal. Tricuspid regurgitation signal is inadequate for assessing PA pressure.  3. The mitral valve is grossly normal. Trivial mitral valve regurgitation.  4. The aortic valve is tricuspid. Aortic valve regurgitation is not visualized.  5. The inferior vena cava is normal in size with greater than 50% respiratory variability, suggesting right atrial pressure of 3 mmHg. FINDINGS  Left Ventricle: Left ventricular ejection fraction, by estimation, is 65 to 70%. The left ventricle has normal function. The left ventricle has no regional wall motion abnormalities. The left ventricular  internal cavity size was normal in size. There is  moderate left ventricular hypertrophy. Left ventricular diastolic parameters were normal. Right Ventricle: The right ventricular size is normal. No increase in right ventricular wall thickness. Right ventricular systolic function is normal. Tricuspid regurgitation signal is inadequate for assessing PA pressure. Left Atrium: Left atrial size was normal in size. Right Atrium: Right atrial size was normal in size. Pericardium: There is no evidence of pericardial effusion. Mitral Valve: The mitral valve is grossly normal. Trivial mitral valve regurgitation. Tricuspid Valve: The tricuspid valve is grossly normal. Tricuspid valve regurgitation is trivial.  Aortic Valve: The aortic valve is tricuspid. Aortic valve regurgitation is not visualized. Mild to moderate aortic valve annular calcification. Pulmonic Valve: The pulmonic valve was grossly normal. Pulmonic valve regurgitation is trivial. Aorta: The aortic root is normal in size and structure. Venous: The inferior vena cava is normal in size with greater than 50% respiratory variability, suggesting right atrial pressure of 3 mmHg. IAS/Shunts: No atrial level shunt detected by color flow Doppler.  LEFT VENTRICLE PLAX 2D LVIDd:         3.54 cm  Diastology LVIDs:         2.16 cm  LV e' lateral:   8.59 cm/s LV PW:         1.62 cm  LV E/e' lateral: 11.0 LV IVS:        1.60 cm  LV e' medial:    6.42 cm/s LVOT diam:     2.00 cm  LV E/e' medial:  14.8 LVOT Area:     3.14 cm  RIGHT VENTRICLE RV S prime:     12.20 cm/s TAPSE (M-mode): 2.1 cm LEFT ATRIUM             Index       RIGHT ATRIUM           Index LA diam:        3.40 cm 1.73 cm/m  RA Area:     12.50 cm LA Vol (A2C):   56.2 ml 28.63 ml/m RA Volume:   29.00 ml  14.77 ml/m LA Vol (A4C):   57.3 ml 29.19 ml/m LA Biplane Vol: 60.1 ml 30.61 ml/m   AORTA Ao Root diam: 3.20 cm MITRAL VALVE MV Area (PHT): 3.12 cm    SHUNTS MV Decel Time: 243 msec    Systemic Diam: 2.00 cm  MV E velocity: 94.70 cm/s MV A velocity: 90.80 cm/s MV E/A ratio:  1.04 Rozann Lesches MD Electronically signed by Rozann Lesches MD Signature Date/Time: 04/26/2020/10:21:31 AM    Final     Microbiology: Recent Results (from the past 240 hour(s))  SARS Coronavirus 2 by RT PCR (hospital order, performed in [place] hospital lab) Nasopharyngeal Nasopharyngeal Swab     Status: None   Collection Time: 04/25/20  6:25 PM   Specimen: Nasopharyngeal Swab  Result Value Ref Range Status   SARS Coronavirus 2 NEGATIVE NEGATIVE Final    Comment: (NOTE) SARS-CoV-2 target nucleic acids are NOT DETECTED.  The SARS-CoV-2 RNA is generally detectable in upper and lower respiratory specimens during the acute phase of infection. The lowest concentration of SARS-CoV-2 viral copies this assay can detect is 250 copies / mL. A negative result does not preclude SARS-CoV-2 infection and should not be used as the sole basis for treatment or other patient management decisions.  A negative result may occur with improper specimen collection / handling, submission of specimen other than nasopharyngeal swab, presence of viral mutation(s) within the areas targeted by this assay, and inadequate number of viral copies (<250 copies / mL). A negative result must be combined with clinical observations, patient history, and epidemiological information.  Fact Sheet for Patients:   StrictlyIdeas.no  Fact Sheet for Healthcare Providers: BankingDealers.co.za  This test is not yet approved or  cleared by the [place] FDA and has been authorized for detection and/or diagnosis of SARS-CoV-2 by FDA under an Emergency Use Authorization (EUA).  This EUA will remain in effect (meaning this test can be used) for the duration of the COVID-19 declaration under Section 564(b)(1)  of the Act, 21 U.S.C. section 360bbb-3(b)(1), unless the authorization is terminated or revoked  sooner.  Performed at [facility], [address], [place], [place] [address]      Labs: Basic Metabolic Panel: Recent Labs  Lab 04/25/20 1242  NA 137  K 4.2  CL 103  CO2 26  GLUCOSE 87  BUN 17  CREATININE 1.25*  CALCIUM 8.7*   Liver Function Tests: Recent Labs  Lab 04/25/20 1242  AST 17  ALT 18  ALKPHOS 74  BILITOT 0.4  PROT 7.1  ALBUMIN 3.7   CBC: Recent Labs  Lab 04/25/20 1242  WBC 7.4  NEUTROABS 3.8  HGB 14.1  HCT 43.7  MCV 94.0  PLT 180   Cardiac Enzymes: Recent Labs  Lab 04/25/20 1242  CKTOTAL 72    Signed:  Barton Dubois MD.  [facility] 04/26/2020, 1:52 PM

## 2020-04-26 NOTE — Progress Notes (Signed)
Nsg Discharge Note  Admit Date:  04/25/2020 Discharge date: 04/26/2020   [name] to be D/C'd home per MD order.  AVS completed.  Copy for chart, and copy for patient signed, and dated. Patient/caregiver able to verbalize understanding.  Discharge Medication: Allergies as of 04/26/2020      Reactions   Codeine Itching, Nausea And Vomiting   Latex Other (See Comments)   Turns skin red.    Lisinopril    Kidney failure   Morphine Hives   Adhesive [tape] Rash      Medication List    STOP taking these medications   hydrocortisone 2.5 % rectal cream Commonly known as: ANUSOL-HC   ondansetron 4 MG disintegrating tablet Commonly known as: Zofran ODT   ondansetron 4 MG tablet Commonly known as: ZOFRAN     TAKE these medications   albuterol 108 (90 Base) MCG/ACT inhaler Commonly known as: VENTOLIN HFA Inhale 2 puffs into the lungs every 6 (six) hours as needed for wheezing or shortness of breath.   amLODipine 5 MG tablet Commonly known as: NORVASC Take 1 tablet (5 mg total) by mouth daily. Start taking on: April 27, 2020   aspirin 81 MG EC tablet Take 1 tablet (81 mg total) by mouth daily. Swallow whole. Start taking on: April 27, 2020   busPIRone 10 MG tablet Commonly known as: BUSPAR Take 10 mg by mouth 3 (three) times daily.   cloNIDine 0.1 MG tablet Commonly known as: CATAPRES Take 2 tablets (0.2 mg total) by mouth 3 (three) times daily.   cyclobenzaprine 10 MG tablet Commonly known as: FLEXERIL Take 10 mg by mouth 3 (three) times daily as needed for muscle spasms.   dicyclomine 10 MG capsule Commonly known as: BENTYL Take 1 capsule (10 mg total) by mouth 3 (three) times daily as needed for spasms (or diarrhea).   Dulera 100-5 MCG/ACT Aero Generic drug: mometasone-formoterol Inhale 2 puffs into the lungs 2 (two) times daily.   gabapentin 100 MG capsule Commonly known as: Neurontin Take 1 capsule (100 mg total) by mouth 3 (three) times daily.    pantoprazole 40 MG tablet Commonly known as: Protonix Take 1 tablet (40 mg total) by mouth 2 (two) times daily.   pravastatin 40 MG tablet Commonly known as: Pravachol Take 1 tablet (40 mg total) by mouth every evening.       Discharge Assessment: Vitals:   04/26/20 0745 04/26/20 0945  BP: (!) 125/93 (!) 142/94  Pulse: (!) 56 62  Resp: 17 16  Temp: 98 F (36.7 C) 97.8 F (36.6 C)  SpO2: 98% 97%   Skin clean, dry and intact without evidence of skin break down, no evidence of skin tears noted. IV catheter discontinued intact. Site without signs and symptoms of complications - no redness or edema noted at insertion site, patient denies c/o pain - only slight tenderness at site.  Dressing with slight pressure applied.  D/c Instructions-Education: Discharge instructions given to patient/family with verbalized understanding. D/c education completed with patient/family including follow up instructions, medication list, d/c activities limitations if indicated, with other d/c instructions as indicated by MD - patient able to verbalize understanding, all questions fully answered. Patient instructed to return to ED, call 911, or call MD for any changes in condition.  Patient escorted via [place], and D/C home via private auto.  Zachery Conch, RN 04/26/2020 2:35 PM

## 2020-04-30 ENCOUNTER — Encounter (HOSPITAL_COMMUNITY): Payer: Self-pay

## 2020-04-30 ENCOUNTER — Emergency Department (HOSPITAL_COMMUNITY)
Admission: EM | Admit: 2020-04-30 | Discharge: 2020-04-30 | Disposition: A | Discharge status: Home / Self Care | Payer: Self-pay | Attending: Emergency Medicine | Admitting: Emergency Medicine

## 2020-04-30 ENCOUNTER — Other Ambulatory Visit: Payer: Self-pay

## 2020-04-30 ENCOUNTER — Telehealth: Payer: Self-pay | Admitting: Student

## 2020-04-30 ENCOUNTER — Emergency Department (HOSPITAL_COMMUNITY): Payer: Self-pay

## 2020-04-30 DIAGNOSIS — I10 Essential (primary) hypertension: Secondary | ICD-10-CM | POA: Insufficient documentation

## 2020-04-30 DIAGNOSIS — J449 Chronic obstructive pulmonary disease, unspecified: Secondary | ICD-10-CM | POA: Insufficient documentation

## 2020-04-30 DIAGNOSIS — H81399 Other peripheral vertigo, unspecified ear: Secondary | ICD-10-CM | POA: Insufficient documentation

## 2020-04-30 DIAGNOSIS — Z79899 Other long term (current) drug therapy: Secondary | ICD-10-CM | POA: Insufficient documentation

## 2020-04-30 DIAGNOSIS — Z85038 Personal history of other malignant neoplasm of large intestine: Secondary | ICD-10-CM | POA: Insufficient documentation

## 2020-04-30 DIAGNOSIS — F17210 Nicotine dependence, cigarettes, uncomplicated: Secondary | ICD-10-CM | POA: Insufficient documentation

## 2020-04-30 DIAGNOSIS — Z8673 Personal history of transient ischemic attack (TIA), and cerebral infarction without residual deficits: Secondary | ICD-10-CM | POA: Insufficient documentation

## 2020-04-30 DIAGNOSIS — Z91040 Latex allergy status: Secondary | ICD-10-CM | POA: Insufficient documentation

## 2020-04-30 DIAGNOSIS — Z7982 Long term (current) use of aspirin: Secondary | ICD-10-CM | POA: Insufficient documentation

## 2020-04-30 HISTORY — DX: Cerebral infarction, unspecified: I63.9

## 2020-04-30 LAB — COMPREHENSIVE METABOLIC PANEL
ALT: 19 U/L (ref 0–44)
AST: 17 U/L (ref 15–41)
Albumin: 3.9 g/dL (ref 3.5–5.0)
Alkaline Phosphatase: 83 U/L (ref 38–126)
Anion gap: 9 (ref 5–15)
BUN: 26 mg/dL — ABNORMAL HIGH (ref 6–20)
CO2: 24 mmol/L (ref 22–32)
Calcium: 8.7 mg/dL — ABNORMAL LOW (ref 8.9–10.3)
Chloride: 103 mmol/L (ref 98–111)
Creatinine, Ser: 1.16 mg/dL (ref 0.61–1.24)
GFR calc Af Amer: >60 mL/min (ref >60)
GFR calc non Af Amer: >60 mL/min (ref >60)
Glucose, Bld: 119 mg/dL — ABNORMAL HIGH (ref 70–99)
Potassium: 4.1 mmol/L (ref 3.5–5.1)
Sodium: 136 mmol/L (ref 135–145)
Total Bilirubin: 0.4 mg/dL (ref 0.3–1.2)
Total Protein: 7.5 g/dL (ref 6.5–8.1)

## 2020-04-30 LAB — DIFFERENTIAL
Abs Immature Granulocytes: 0.03 10*3/uL (ref 0.00–0.07)
Basophils Absolute: 0 10*3/uL (ref 0.0–0.1)
Basophils Relative: 1 %
Eosinophils Absolute: 0.1 10*3/uL (ref 0.0–0.5)
Eosinophils Relative: 2 %
Immature Granulocytes: 0 %
Lymphocytes Relative: 27 %
Lymphs Abs: 1.9 10*3/uL (ref 0.7–4.0)
Monocytes Absolute: 0.5 10*3/uL (ref 0.1–1.0)
Monocytes Relative: 7 %
Neutro Abs: 4.6 10*3/uL (ref 1.7–7.7)
Neutrophils Relative %: 63 %

## 2020-04-30 LAB — RAPID URINE DRUG SCREEN, HOSP PERFORMED
Amphetamines: NOT DETECTED
Barbiturates: NOT DETECTED
Benzodiazepines: POSITIVE — AB
Cocaine: NOT DETECTED
Opiates: NOT DETECTED
Tetrahydrocannabinol: POSITIVE — AB

## 2020-04-30 LAB — URINALYSIS, ROUTINE W REFLEX MICROSCOPIC
Bacteria, UA: NONE SEEN
Bilirubin Urine: NEGATIVE
Glucose, UA: NEGATIVE mg/dL
Ketones, ur: NEGATIVE mg/dL
Leukocytes,Ua: NEGATIVE
Nitrite: NEGATIVE
Protein, ur: NEGATIVE mg/dL
Specific Gravity, Urine: 1.019 (ref 1.005–1.030)
pH: 5 (ref 5.0–8.0)

## 2020-04-30 LAB — CBC
HCT: 45.5 % (ref 39.0–52.0)
Hemoglobin: 15.1 g/dL (ref 13.0–17.0)
MCH: 31.1 pg (ref 26.0–34.0)
MCHC: 33.2 g/dL (ref 30.0–36.0)
MCV: 93.6 fL (ref 80.0–100.0)
Platelets: 192 10*3/uL (ref 150–400)
RBC: 4.86 MIL/uL (ref 4.22–5.81)
RDW: 12.9 % (ref 11.5–15.5)
WBC: 7.2 10*3/uL (ref 4.0–10.5)
nRBC: 0 % (ref 0.0–0.2)

## 2020-04-30 LAB — PROTIME-INR
INR: 1 (ref 0.8–1.2)
Prothrombin Time: 12.7 seconds (ref 11.4–15.2)

## 2020-04-30 LAB — ETHANOL: Alcohol, Ethyl (B): <10 mg/dL (ref <10)

## 2020-04-30 LAB — APTT: aPTT: 30 seconds (ref 24–36)

## 2020-04-30 MED ORDER — LORAZEPAM 2 MG/ML IJ SOLN
1.0000 mg | Freq: Once | INTRAMUSCULAR | Status: AC | PRN
  Administered 2020-04-30: 1 mg via INTRAVENOUS
  Filled 2020-04-30: qty 1

## 2020-04-30 MED ORDER — MECLIZINE HCL 12.5 MG PO TABS
25.0000 mg | ORAL_TABLET | Freq: Once | ORAL | Status: AC
  Administered 2020-04-30: 25 mg via ORAL
  Filled 2020-04-30: qty 2

## 2020-04-30 MED ORDER — ACETAMINOPHEN 325 MG PO TABS
650.0000 mg | ORAL_TABLET | Freq: Once | ORAL | Status: AC
  Administered 2020-04-30: 650 mg via ORAL
  Filled 2020-04-30: qty 2

## 2020-04-30 MED ORDER — MECLIZINE HCL 25 MG PO TABS: 25.0000 mg | ORAL_TABLET | Freq: Three times a day (TID) | ORAL | 0 refills | Status: DC | PRN

## 2020-04-30 MED ORDER — MECLIZINE HCL 12.5 MG PO TABS: 25.0000 mg | ORAL_TABLET | Freq: Once | ORAL | Status: DC

## 2020-04-30 NOTE — ED Provider Notes (Signed)
[facility] Provider Note   CSN: [id number] Arrival date & time: 04/30/20  2637     History Chief Complaint  Patient presents with  . Dizziness  . Headache    [name] is a 54 y.o. male with a past medical history of tobacco abuse, marijuana abuse, hypertension, hyperlipidemia, COPD, adenoma of the colon with multiple abdominal surgeries, recent admission for TIA on 8/18/2021with chronic right arm paresthesia who presents emergency department chief complaint of vertigo.  Patient states that about 6 PM last night he began having room spinning dizziness, disequilibrium and ataxia.  He has never had anything like this before.  He denies nausea or vomiting, diplopia, headache.  He has chronic paresthesia in the right arm and intermittent paresthesia in the left arm which she states is no worse than normal.  He denies weakness, difficulty with speech or swallowing.  Review of EMR shows that the patient had normal MRI, MRA showed patent vessels to the brain.  He denies any recent head injuries.  He has been taking his medication as prescribed.  HPI     Past Medical History:  Diagnosis Date  . Acute pancreatitis   . Anxiety   . Arthritis   . Cancer ([place])    Adenoma in colon  . Cannabis abuse   . COPD (chronic obstructive pulmonary disease) ([place])   . Depression   . Diverticulitis of colon with perforation 08/2010  . Dyspnea   . GERD (gastroesophageal reflux disease)   . History of hiatal hernia   . Hypertension    taken off lisinopril by renal md 08/2011  . Renal insufficiency    follwed by Dr. Hinda Lenis  . Sleep apnea    does not use CPAP, cannot tolerate  . Stroke [facility])    TIA    Patient Active Problem List   Diagnosis Date Noted  . TIA (transient ischemic attack)   . Cervical radiculopathy   . Tobacco abuse   . Hyperlipidemia   . Focal neurological deficit 04/25/2020  . Anal fissure 12/22/2019  . Abdominal pain 12/06/2019  . Internal  hemorrhoids 12/06/2019  . Acute esophagitis 10/12/2019  . Black stools 10/12/2019  . Diarrhea 10/12/2019  . 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 Chronic obstructive pulmonary disease ([place]) 12/14/2017  . Esophageal dysphagia 09/24/2017  . History of reversal of ileostomy 09/24/2017  . History of colostomy reversal 09/24/2017  . History of colonic polyps 09/24/2017  . Head ache 05/06/2012  . Chronic abdominal pain 05/06/2012  . Vomiting 05/06/2012  . GERD (gastroesophageal reflux disease) 03/15/2012  . Gastritis and duodenitis 03/15/2012  . Chronic generalized abdominal pain 02/10/2012  . Epigastric pain 02/10/2012  . Nausea 02/10/2012  . Diverticulitis of colon with perforation 02/10/2012  . ANXIETY 10/05/2009  . Cannabis abuse 10/05/2009  . HTN (hypertension) 10/05/2009  . ACUTE PANCREATITIS 10/05/2009  . HEMATEMESIS 10/05/2009  . HEMATOCHEZIA 10/05/2009    Past Surgical History:  Procedure Laterality Date  . APPENDECTOMY    . BIOPSY  12/14/2017   Procedure: BIOPSY;  Surgeon: Daneil Dolin, MD;  [facility];  Service: Endoscopy;;  gastric   . BIOPSY  10/13/2019   Procedure: BIOPSY;  Surgeon: Daneil Dolin, MD;  [facility];  Service: Endoscopy;;  esophagus  . clamp left in colon after abd surgery    . COLON SURGERY  05/2011   [facility]. exp laparotomy with colostomy takedown, lysis of adhesions (3hours), loop ileostomy. complicated by abscess formation requiring percutaneous  drainage  . colonoscopy via stoma with endoscopy of [name]  04/2011   [facility]. two sessile polyps in trv colon. complete resection but only partial retrieval. congested mucosa in area 3cm proximal to stoma, erythematous mucosa in Hartmann pouch. repeat tcs 8/2017path-adenomatous polyp  . COLONOSCOPY WITH PROPOFOL N/A 12/14/2017   Dr. Gala Romney: 2 4 to 6 mm polyps in the cecum, tubular adenomas, single rectal polyp tubular adenoma, nonbleeding internal hemorrhoids.  Appears to be a surgical clip at  the appendiceal orifice.  Next colonoscopy in 5 years  . ESOPHAGOGASTRODUODENOSCOPY  03/05/2012   erosive reflux esophagitis, bulbar erosions, gastric erosions/petichae, bx showed gastritis, no H.Pylori. Procedure: ESOPHAGOGASTRODUODENOSCOPY (EGD);  Surgeon: Daneil Dolin, MD;  [facility];  Service: Endoscopy;  Laterality: N/A;  11:30  . ESOPHAGOGASTRODUODENOSCOPY (EGD) WITH PROPOFOL N/A 12/14/2017   Dr. Gala Romney: Erosive reflux esophagitis with questionable mild stricturing/non-obstruction at this level status post dilation.  multiple gastric erosions in the stomach biopsy showed reactive gastropathy but no H. pylori.  . ESOPHAGOGASTRODUODENOSCOPY (EGD) WITH PROPOFOL N/A 10/13/2019   negative Barrett's, normal stomach  . HERNIA REPAIR    . KNEE SURGERY Right 2006  . MALONEY DILATION N/A 12/14/2017   Procedure: Venia Minks DILATION;  Surgeon: Daneil Dolin, MD;  [facility];  Service: Endoscopy;  Laterality: N/A;  . MULTIPLE EXTRACTIONS WITH ALVEOLOPLASTY  02/23/2012   Procedure: MULTIPLE EXTRACION WITH ALVEOLOPLASTY;  Surgeon: Gae Bon, DDS;  Location: [facility];  Service: Oral Surgery;  Laterality: Bilateral;  Removal of left mandibular torus, multiple extractions with alveoloplasty  . POLYPECTOMY  12/14/2017   Procedure: POLYPECTOMY;  Surgeon: Daneil Dolin, MD;  [facility];  Service: Endoscopy;;  . reversal of colostomy  10/2011   per patient, did not have report  . right knee surgery    . sigmoid colectomy with end colostomy, Hartmann's pouch  08/2010   [facility]. complicated diverticulitis  . UMBILICAL HERNIA REPAIR         Family History  Problem Relation Age of Onset  . Stomach cancer Father   . Lung cancer Father   . Throat cancer Father   . Cancer Father        lung and throat cancer  . Hypertension Father   . Liver disease Cousin        cirrhosis, etoh  . Throat cancer Paternal Uncle   . Lung cancer Paternal Uncle   . Colon cancer Cousin 26  .  Heart disease Mother   . Hypertension Mother   . Colon cancer Maternal Uncle   . Throat cancer Paternal Uncle   . Lung cancer Paternal Uncle   . Throat cancer Paternal Uncle   . Lung cancer Paternal Uncle     Social History   Tobacco Use  . Smoking status: Current Every Day Smoker    Packs/day: 1.00    Years: 37.00    Pack years: 37.00    Types: Cigarettes  . Smokeless tobacco: Current User    Types: Snuff  . Tobacco comment: dips some trying to quit smoking  Vaping Use  . Vaping Use: Never used  Substance Use Topics  . Alcohol use: Not Currently  . Drug use: Yes    Frequency: 7.0 times per week    Types: Marijuana    Comment: none since last week    Home Medications Prior to Admission medications   Medication Sig Start Date End Date Taking? Authorizing Provider  albuterol (PROVENTIL HFA;VENTOLIN HFA)  108 (90 Base) MCG/ACT inhaler Inhale 2 puffs into the lungs every 6 (six) hours as needed for wheezing or shortness of breath.    [provider]  amLODipine (NORVASC) 5 MG tablet Take 1 tablet (5 mg total) by mouth daily. 04/27/20   Barton Dubois, MD  aspirin EC 81 MG EC tablet Take 1 tablet (81 mg total) by mouth daily. Swallow whole. 04/27/20   Barton Dubois, MD  busPIRone (BUSPAR) 10 MG tablet Take 10 mg by mouth 3 (three) times daily. Patient not taking: Reported on 04/25/2020    [provider]  cloNIDine (CATAPRES) 0.1 MG tablet Take 2 tablets (0.2 mg total) by mouth 3 (three) times daily. 04/26/20   Barton Dubois, MD  cyclobenzaprine (FLEXERIL) 10 MG tablet Take 10 mg by mouth 3 (three) times daily as needed for muscle spasms.    [provider]  dicyclomine (BENTYL) 10 MG capsule Take 1 capsule (10 mg total) by mouth 3 (three) times daily as needed for spasms (or diarrhea). 12/06/19   Carlis Stable, NP  gabapentin (NEURONTIN) 100 MG capsule Take 1 capsule (100 mg total) by mouth 3 (three) times daily. 04/26/20 04/26/21  Barton Dubois, MD    meclizine (ANTIVERT) 25 MG tablet Take 1 tablet (25 mg total) by mouth 3 (three) times daily as needed for dizziness. 04/30/20   Sonora Catlin, PA-C  mometasone-formoterol (DULERA) 100-5 MCG/ACT AERO Inhale 2 puffs into the lungs 2 (two) times daily. Patient not taking: Reported on 04/25/2020    [provider]  pantoprazole (PROTONIX) 40 MG tablet Take 1 tablet (40 mg total) by mouth 2 (two) times daily. 04/26/20   Barton Dubois, MD  pravastatin (PRAVACHOL) 40 MG tablet Take 1 tablet (40 mg total) by mouth every evening. 04/26/20 04/26/21  Barton Dubois, MD    Allergies    Codeine, Latex, Lisinopril, Morphine, and Adhesive [tape]  Review of Systems   Review of Systems Ten systems reviewed and are negative for acute change, except as noted in the HPI.   Physical Exam Updated Vital Signs BP (!) 162/85 (BP Location: Right Arm)   Pulse 62   Temp 97.6 F (36.4 C) (Oral)   Resp 11   Ht 5\' 7"  (1.702 m)   Wt 77.1 kg   SpO2 99%   BMI 26.63 kg/m   Physical Exam Vitals and nursing note reviewed.  Constitutional:      General: He is not in acute distress.    Appearance: He is well-developed. He is not diaphoretic.  HENT:     Head: Normocephalic and atraumatic.  Eyes:     General: No visual field deficit or scleral icterus.    Conjunctiva/sclera: Conjunctivae normal.  Cardiovascular:     Rate and Rhythm: Normal rate and regular rhythm.     Heart sounds: Normal heart sounds.  Pulmonary:     Effort: Pulmonary effort is normal. No respiratory distress.     Breath sounds: Normal breath sounds.  Abdominal:     Palpations: Abdomen is soft.     Tenderness: There is no abdominal tenderness.  Musculoskeletal:     Cervical back: Normal range of motion and neck supple.  Skin:    General: Skin is warm and dry.  Neurological:     Mental Status: He is alert and oriented to person, place, and time.     GCS: GCS eye subscore is 4. GCS verbal subscore is 5. GCS motor subscore is 6.      Cranial Nerves:  No cranial nerve deficit, dysarthria or facial asymmetry.     Sensory: Sensory deficit present.     Motor: No weakness, atrophy, abnormal muscle tone, seizure activity or pronator drift.     Coordination: Romberg sign positive. Coordination normal. Finger-Nose-Finger Test normal.     Gait: Gait is intact. Gait normal.     Deep Tendon Reflexes: Reflexes normal.     Reflex Scores:      Patellar reflexes are 2+ on the right side and 2+ on the left side.    Comments: No nystagmus Balance is poor patient unable to ambulate without assitance  Psychiatric:        Mood and Affect: Mood is anxious.        Behavior: Behavior normal.     ED Results / Procedures / Treatments   Labs (all labs ordered are listed, but only abnormal results are displayed) Labs Reviewed  COMPREHENSIVE METABOLIC PANEL - Abnormal; Notable for the following components:      Result Value   Glucose, Bld 119 (*)    BUN 26 (*)    Calcium 8.7 (*)    All other components within normal limits  RAPID URINE DRUG SCREEN, HOSP PERFORMED - Abnormal; Notable for the following components:   Benzodiazepines POSITIVE (*)    Tetrahydrocannabinol POSITIVE (*)    All other components within normal limits  URINALYSIS, ROUTINE W REFLEX MICROSCOPIC - Abnormal; Notable for the following components:   Hgb urine dipstick SMALL (*)    All other components within normal limits  ETHANOL  PROTIME-INR  APTT  CBC  DIFFERENTIAL  I-STAT CHEM 8, ED    EKG None  ED ECG REPORT   Date: 04/30/2020  Rate: 64  Rhythm: normal sinus rhythm  QRS Axis: normal  Intervals: normal  ST/T Wave abnormalities: diffuse st segment elevation  Conduction Disutrbances:none  Narrative Interpretation:   Old EKG Reviewed:     Radiology MR BRAIN WO CONTRAST  Result Date: 04/30/2020 CLINICAL DATA:  Acute neuro deficit, stroke suspected. Dizziness, headache, blurred vision, right arm numbness and tingling. EXAM: MRI HEAD WITHOUT  CONTRAST TECHNIQUE: Multiplanar, multiecho pulse sequences of the brain and surrounding structures were obtained without intravenous contrast. COMPARISON:  MRI 04/26/2020 and 04/25/2020 FINDINGS: Brain: No restricted diffusion. No acute hemorrhage. No hydrocephalus. A few small scattered T2/FLAIR hyperintense white matter lesions are nonspecific, but likely relate to mild chronic microvascular ischemic disease. No mass lesion or abnormal mass effect. Vascular: Normal flow voids. Skull and upper cervical spine: Degenerative changes of the craniocervical junction with degenerative pannus. No evidence of advanced upper cervical canal stenosis. Sinuses/Orbits: Negative. Other: Normal appearance of the pituitary. No sizable mastoid effusion. IMPRESSION: No acute intracranial abnormality.  Specifically, no acute infarct. Electronically Signed   By: Margaretha Sheffield MD   On: 04/30/2020 12:49    Procedures Procedures (including critical care time)  Medications Ordered in ED Medications  meclizine (ANTIVERT) tablet 25 mg (25 mg Oral Given 04/30/20 1039)  LORazepam (ATIVAN) injection 1 mg (1 mg Intravenous Given 04/30/20 1039)  acetaminophen (TYLENOL) tablet 650 mg (650 mg Oral Given 04/30/20 1152)    ED Course  I have reviewed the triage vital signs and the nursing notes.  Pertinent labs & imaging results that were available during my care of the patient were reviewed by me and considered in my medical decision making (see chart for details).  Clinical Course as of May 01 1643  Mon Apr 30, 2020  1035 Case discussed with Dr. Bridgett Larsson,  teleneurologist, who recommends MRI brain.   [AH]    Clinical Course User Index [AH] Margarita Mail, PA-C   MDM Rules/Calculators/A&P                         4:44 PM BP (!) 162/85 (BP Location: Right Arm)   Pulse 62   Temp 97.6 F (36.4 C) (Oral)   Resp 11   Ht 5\' 7"  (1.702 m)   Wt 77.1 kg   SpO2 99%   BMI 26.63 kg/m   Patient here with vertigo, hypertension.   Patient states that he did take his medications today.  Reviewed EMR, recent admission and imaging and patient's MRA showed patent vessels supplying the brain. The emergent differential diagnosis for acute vertigo low includes peripheral causes such as BPPV, barotrauma, ear foreign body, Mnire's disease, infectious causes such as lip bronchitis, vestibular neuritis or Ramsay Hunt syndrome.  Other emergent causes are central such as cerebellar stroke, vertebrobasilar insufficiency, neoplastic causes, vertebral artery dissection, MS, neurosyphilis or tuberculosis, epilepsy or migraine.  Other causes include anemia, hyperviscosity syndrome, alcohol or aminoglycoside use, renal failure, hypoglycemia and thyroid disease. I have ordered standard labs, teleneuro consult.    This patient complains of vertigo, this involves an extensive number of treatment options, and is a complaint that carries with it a high risk of complications and morbidity.    I Ordered, reviewed, and interpreted labs, which included urinalysis which is small amount of hemoglobin without evidence of infection, ethanol, APTT, PT/INR all within normal limits, CBC without elevated white blood cell count, CMP shows mildly elevated blood glucose of insignificant value, UDS positive for benzos and THC. I ordered medication meclizine and Tylenol for headache and dizziness I ordered imaging studies which included MRI of the brain without contrast and I independently visualized and interpreted imaging which showed no acute findings on the MRI Additional history obtained from EMR Previous records obtained and reviewed  I consulted teleneurologist and discussed lab and imaging findings   After the interventions stated above, I reevaluated the patient and found headache and vertiginous symptoms resolved.  Disequilibrium and ataxia resolved.   Patient here with symptoms of vertigo, recent TIA work-up.  This is a fairly extensive work-up.   Patient's MRI is negative for stroke.  This appears to be peripheral vertigo and patient symptoms have resolved.  Patient will be discharged with meclizine and ENT follow-up.  He is no longer ataxic.  Patient had a mild headache during the visit which was treated with Tylenol and resolved.  Patient is ambulatory without ataxia or disequilibrium.  Patient appears otherwise appropriate for discharge at this time.   Final Clinical Impression(s) / ED Diagnoses Final diagnoses:  Peripheral vertigo, unspecified laterality    Rx / DC Orders ED Discharge Orders         Ordered    meclizine (ANTIVERT) 25 MG tablet  3 times daily PRN        04/30/20 1301           Margarita Mail, PA-C 04/30/20 1654    Noemi Chapel, MD 05/01/20 [phone]

## 2020-04-30 NOTE — ED Notes (Signed)
Called Teleneurology for [employer]

## 2020-04-30 NOTE — ED Triage Notes (Signed)
Pt reports dizziness, headache, blurred vision, and r arm numb and tingly since 6pm last night.  Reports was here for same last week and says was told he had a TIA.

## 2020-04-30 NOTE — Consult Note (Signed)
[facility] Consult Services  Stat Consult  Date of Service:   04/30/2020 09:45:19  Impression:     .  R42 - Dizziness/ Vertigo/ Giddiness  Comments/Sign-Out: The patient is a 54 year old man with a history of hypertension, hyperlipidemia, who presents with recurrence right side weakness. Previous episode diagnosed as TIA, negative workup except hypertension. Now recurrence of symptoms persistence > 12 hours. Recommend repeat MRI brain without contrast. If MRI does show stroke, will need to determine if workup already did is adequate to explain CVA. If no CVA is seen, recommend symptomatic treatment of symptoms.  CT HEAD: Not Reviewed No neuroimaging ordered  Metrics: [facility] Notification Time: 04/30/2020 09:43:46 Stamp Time: 04/30/2020 09:45:19 Callback Response Time: 04/30/2020 09:50:14  Our recommendations are outlined below.  Recommendations:     .  Ativan 1 mg Stat  Imaging Studies:     .  MRI Head Without Contrast  Disposition: Neurology Follow Up Recommended  Sign Out:     .  Discussed with Emergency Department Provider  ----------------------------------------------------------------------------------------------------  Chief Complaint: dizziness  History of Present Illness: Patient is a 54 year old Male.  Patient first developed dizziness and numbness over the right side last week. Reports numbness over the right face and arm. Describes dizziness as head spinning sensation. Came to hospital. Workup included negative MRI brain, MRA head, carotid ultrasound, TTE. HgbA1C within normal limits. LDL slightly high at 125, Bps elevated. Diagnosed as TIA, sent home on ASA. Was feeling better at home, recurrence of symptoms last night at 6 PM, constant. Reports dizziness at rest, worse with movement.   Past Medical History:     . Hypertension     . Hyperlipidemia   Antiplatelet use: ASA 81  mg    Examination: BP(146/100), Pulse(58), 1A: Level of Consciousness - Alert; keenly responsive + 0 1B: Ask Month and Age - Both Questions Right + 0 1C: Blink Eyes & Squeeze Hands - Performs Both Tasks + 0 2: Test Horizontal Extraocular Movements - Normal + 0 3: Test Visual Fields - No Visual Loss + 0 4: Test Facial Palsy (Use Grimace if Obtunded) - Normal symmetry + 0 5A: Test Left Arm Motor Drift - No Drift for 10 Seconds + 0 5B: Test Right Arm Motor Drift - No Drift for 10 Seconds + 0 6A: Test Left Leg Motor Drift - No Drift for 5 Seconds + 0 6B: Test Right Leg Motor Drift - No Drift for 5 Seconds + 0 7: Test Limb Ataxia (FNF/Heel-Shin) - No Ataxia + 0 8: Test Sensation - Mild-Moderate Loss: Less Sharp/More Dull + 1 9: Test Language/Aphasia - Normal; No aphasia + 0 10: Test Dysarthria - Normal + 0 11: Test Extinction/Inattention - No abnormality + 0  NIHSS Score: 1    Patient is being evaluated for possible acute neurologic impairment and high probability of imminent or life-threatening deterioration. I spent total of 23 minutes providing care to this patient, including time for face to face visit via telemedicine, review of medical records, imaging studies and discussion of findings with providers, the patient and/or family.   Dr Serita Grammes   [facility] [phone]  Case [id number]

## 2020-04-30 NOTE — Telephone Encounter (Signed)
Pt called requesting an appt for today for dizziness/ lightheadedness. Pt states he was in the ER and was dx with TIA. Pt states his symptoms are similar as when he went to ER.  Pt has not f/u with [facility] since December 2020.  LPN advised pt to go back to ER due to his symptoms being similar to when he had TIA. Pt was advised to call the office after his out of the hospital to schedule f/u. Pt verbalized understanding.

## 2020-04-30 NOTE — ED Notes (Signed)
Pt transported to MRI 

## 2020-04-30 NOTE — Discharge Instructions (Addendum)
Your MRI was negative and you do not appear to have a stroke as the cause of your dizziness and imbalance today.  This is likely a problem with your inner ear.  I have ordered some medication for treatment of your symptoms.  You may follow-up with your primary care doctor or with ear nose and throat.  Return for the reasons listed below  Get help right away if you: Have difficulty moving or speaking. Are always dizzy. Faint. Develop severe headaches. Have weakness in your hands, arms, or legs. Have changes in your hearing or vision. Develop a stiff neck. Develop sensitivity to light.

## 2020-05-02 ENCOUNTER — Other Ambulatory Visit: Payer: Self-pay

## 2020-05-02 ENCOUNTER — Encounter: Payer: Self-pay | Admitting: Internal Medicine

## 2020-05-02 ENCOUNTER — Ambulatory Visit (INDEPENDENT_AMBULATORY_CARE_PROVIDER_SITE_OTHER): Payer: Self-pay | Admitting: Gastroenterology

## 2020-05-02 ENCOUNTER — Encounter: Payer: Self-pay | Admitting: Gastroenterology

## 2020-05-02 VITALS — BP 115/75 | HR 59 | Temp 97.6°F | Ht 67.0 in | Wt 194.8 lb

## 2020-05-02 DIAGNOSIS — K602 Anal fissure, unspecified: Secondary | ICD-10-CM

## 2020-05-02 DIAGNOSIS — K219 Gastro-esophageal reflux disease without esophagitis: Secondary | ICD-10-CM

## 2020-05-02 NOTE — Progress Notes (Signed)
Referring Provider: Soyla Dryer, PA-C Primary Care Physician:  Soyla Dryer, PA-C Primary GI: Dr. Gala Romney   Chief Complaint  Patient presents with  . Hemorrhoids    here for banding    HPI:   [name] is a 54 y.o. male presenting today with a history of symptomatic anal fissure, Grade 2 internal hemorrhoids, recent colonoscopy on file. Unable to band at last visit in April 2021 due to concerns for anal fissure.   Hospitalized Aug 2021 with concerns for TIA. Negative for acute stroke. Vertigo in ED recently. New insurance Sept 1st. Head doesn't feel right. No dizziness. Feels tired. Rectal pain improved. Used the [employer] cream. Hasn't used in over a month. No rectal bleeding. Some constipation recently. Usually BM every day but now since starting new medication has had some sluggish bowels. Will be picking up Miralax when he leaves. No pressure or prolapsing tissue in rectum since using cream. Doing much better than last visit.   Had 2 episodes of solid food dysphagia recently but notes this is much improved since last EGD.   Past Medical History:  Diagnosis Date  . Acute pancreatitis   . Anxiety   . Arthritis   . Cancer ([place])    Adenoma in colon  . Cannabis abuse   . COPD (chronic obstructive pulmonary disease) ([place])   . Depression   . Diverticulitis of colon with perforation 08/2010  . Dyspnea   . GERD (gastroesophageal reflux disease)   . History of hiatal hernia   . Hypertension    taken off lisinopril by renal md 08/2011  . Renal insufficiency    follwed by Dr. Hinda Lenis  . Sleep apnea    does not use CPAP, cannot tolerate  . Stroke [facility])    TIA    Past Surgical History:  Procedure Laterality Date  . APPENDECTOMY    . BIOPSY  12/14/2017   Procedure: BIOPSY;  Surgeon: Daneil Dolin, MD;  [facility];  Service: Endoscopy;;  gastric   . BIOPSY  10/13/2019   Procedure: BIOPSY;  Surgeon: Daneil Dolin, MD;  [facility];  Service: Endoscopy;;  esophagus  . clamp left in colon after abd surgery    . COLON SURGERY  05/2011   [facility]. exp laparotomy with colostomy takedown, lysis of adhesions (3hours), loop ileostomy. complicated by abscess formation requiring percutaneous drainage  . colonoscopy via stoma with endoscopy of [name]  04/2011   [facility]. two sessile polyps in trv colon. complete resection but only partial retrieval. congested mucosa in area 3cm proximal to stoma, erythematous mucosa in Hartmann pouch. repeat tcs 8/2017path-adenomatous polyp  . COLONOSCOPY WITH PROPOFOL N/A 12/14/2017   Dr. Gala Romney: 2 4 to 6 mm polyps in the cecum, tubular adenomas, single rectal polyp tubular adenoma, nonbleeding internal hemorrhoids.  Appears to be a surgical clip at the appendiceal orifice.  Next colonoscopy in 5 years  . ESOPHAGOGASTRODUODENOSCOPY  03/05/2012   erosive reflux esophagitis, bulbar erosions, gastric erosions/petichae, bx showed gastritis, no H.Pylori. Procedure: ESOPHAGOGASTRODUODENOSCOPY (EGD);  Surgeon: Daneil Dolin, MD;  [facility];  Service: Endoscopy;  Laterality: N/A;  11:30  . ESOPHAGOGASTRODUODENOSCOPY (EGD) WITH PROPOFOL N/A 12/14/2017   Dr. Gala Romney: Erosive reflux esophagitis with questionable mild stricturing/non-obstruction at this level status post dilation.  multiple gastric erosions in the stomach biopsy showed reactive gastropathy but no H. pylori.  . ESOPHAGOGASTRODUODENOSCOPY (EGD) WITH PROPOFOL N/A 10/13/2019   negative Barrett's, normal stomach  .  HERNIA REPAIR    . KNEE SURGERY Right 2006  . MALONEY DILATION N/A 12/14/2017   Procedure: Venia Minks DILATION;  Surgeon: Daneil Dolin, MD;  [facility];  Service: Endoscopy;  Laterality: N/A;  . MULTIPLE EXTRACTIONS WITH ALVEOLOPLASTY  02/23/2012   Procedure: MULTIPLE EXTRACION WITH ALVEOLOPLASTY;  Surgeon: Gae Bon, DDS;  Location: [place];  Service: Oral Surgery;  Laterality: Bilateral;  Removal of left  mandibular torus, multiple extractions with alveoloplasty  . POLYPECTOMY  12/14/2017   Procedure: POLYPECTOMY;  Surgeon: Daneil Dolin, MD;  [facility];  Service: Endoscopy;;  . reversal of colostomy  10/2011   per patient, did not have report  . right knee surgery    . sigmoid colectomy with end colostomy, Hartmann's pouch  08/2010   [facility]. complicated diverticulitis  . UMBILICAL HERNIA REPAIR      Current Outpatient Medications  Medication Sig Dispense Refill  . albuterol (PROVENTIL HFA;VENTOLIN HFA) 108 (90 Base) MCG/ACT inhaler Inhale 2 puffs into the lungs every 6 (six) hours as needed for wheezing or shortness of breath.    Marland Kitchen amLODipine (NORVASC) 5 MG tablet Take 1 tablet (5 mg total) by mouth daily. 30 tablet 1  . aspirin EC 81 MG EC tablet Take 1 tablet (81 mg total) by mouth daily. Swallow whole. 30 tablet 11  . busPIRone (BUSPAR) 10 MG tablet Take 10 mg by mouth 3 (three) times daily.     . cloNIDine (CATAPRES) 0.1 MG tablet Take 2 tablets (0.2 mg total) by mouth 3 (three) times daily. 90 tablet 1  . cyclobenzaprine (FLEXERIL) 10 MG tablet Take 10 mg by mouth 3 (three) times daily as needed for muscle spasms.    Marland Kitchen dicyclomine (BENTYL) 10 MG capsule Take 1 capsule (10 mg total) by mouth 3 (three) times daily as needed for spasms (or diarrhea). 30 capsule 3  . gabapentin (NEURONTIN) 100 MG capsule Take 1 capsule (100 mg total) by mouth 3 (three) times daily. 90 capsule 2  . meclizine (ANTIVERT) 25 MG tablet Take 1 tablet (25 mg total) by mouth 3 (three) times daily as needed for dizziness. 30 tablet 0  . mometasone-formoterol (DULERA) 100-5 MCG/ACT AERO Inhale 2 puffs into the lungs daily.     . pantoprazole (PROTONIX) 40 MG tablet Take 1 tablet (40 mg total) by mouth 2 (two) times daily. 60 tablet 1  . pravastatin (PRAVACHOL) 40 MG tablet Take 1 tablet (40 mg total) by mouth every evening. 30 tablet 4   No current facility-administered medications for this visit.     Allergies as of 05/02/2020 - Review Complete 05/02/2020  Allergen Reaction Noted  . Codeine Itching and Nausea And Vomiting   . Latex Other (See Comments) 03/09/2012  . Lisinopril  05/27/2017  . Morphine Hives   . Adhesive [tape] Rash 03/08/2012    Family History  Problem Relation Age of Onset  . Stomach cancer Father   . Lung cancer Father   . Throat cancer Father   . Cancer Father        lung and throat cancer  . Hypertension Father   . Liver disease Cousin        cirrhosis, etoh  . Throat cancer Paternal Uncle   . Lung cancer Paternal Uncle   . Colon cancer Cousin 75  . Heart disease Mother   . Hypertension Mother   . Colon cancer Maternal Uncle   . Throat cancer Paternal Uncle   . Lung cancer  Paternal Uncle   . Throat cancer Paternal Uncle   . Lung cancer Paternal Uncle     Social History   Socioeconomic History  . Marital status: Single    Spouse name: Not on file  . Number of children: 0  . Years of education: Not on file  . Highest education level: Not on file  Occupational History  . Not on file  Tobacco Use  . Smoking status: Current Every Day Smoker    Packs/day: 1.00    Years: 37.00    Pack years: 37.00    Types: Cigarettes  . Smokeless tobacco: Former Systems developer    Types: Snuff  . Tobacco comment: dips some trying to quit smoking  Vaping Use  . Vaping Use: Never used  Substance and Sexual Activity  . Alcohol use: Not Currently  . Drug use: Yes    Frequency: 7.0 times per week    Types: Marijuana    Comment: none since last week  . Sexual activity: Not on file  Other Topics Concern  . Not on file  Social History Narrative  . Not on file   Social Determinants of Health   Financial Resource Strain:   . Difficulty of Paying Living Expenses: Not on file  Food Insecurity:   . Worried About Charity fundraiser in the Last Year: Not on file  . Ran Out of Food in the Last Year: Not on file  Transportation Needs:   . Lack of Transportation  (Medical): Not on file  . Lack of Transportation (Non-Medical): Not on file  Physical Activity:   . Days of Exercise per Week: Not on file  . Minutes of Exercise per Session: Not on file  Stress:   . Feeling of Stress : Not on file  Social Connections:   . Frequency of Communication with Friends and Family: Not on file  . Frequency of Social Gatherings with Friends and Family: Not on file  . Attends Religious Services: Not on file  . Active Member of Clubs or Organizations: Not on file  . Attends Archivist Meetings: Not on file  . Marital Status: Not on file    Review of Systems: Gen: Denies fever, chills, anorexia. Denies fatigue, weakness, weight loss.  CV: Denies chest pain, palpitations, syncope, peripheral edema, and claudication. Resp: Denies dyspnea at rest, cough, wheezing, coughing up blood, and pleurisy. GI: see HPI Derm: Denies rash, itching, dry skin Psych: Denies depression, anxiety, memory loss, confusion. No homicidal or suicidal ideation.  Heme: Denies bruising, bleeding, and enlarged lymph nodes.  Physical Exam: BP 115/75   Pulse (!) 59   Temp 97.6 F (36.4 C)   Ht 5\' 7"  (1.702 m)   Wt 194 lb 12.8 oz (88.4 kg)   BMI 30.51 kg/m  General:   Alert and oriented. No distress noted. Pleasant and cooperative.  Head:  Normocephalic and atraumatic. Eyes:  Conjuctiva clear without scleral icterus. Mouth:  Mask in place Abdomen:  +BS, soft, non-tender and non-distended. No rebound or guarding. No HSM or masses noted. Msk:  Symmetrical without gross deformities. Normal posture. Extremities:  Without edema. Neurologic:  Alert and  oriented x4 Psych:  Alert and cooperative. Normal mood and affect.  ASSESSMENT: TREMAYNE SHELDON is a 54 y.o. male presenting today with history of symptomatic anal fissure, Grade 2 hemorrhoids, recent colonoscopy on file, with recent hospitalization for TIA concerns.   Clinically, he has had resolution of rectal pain and no  issues with symptomatic  hemorrhoids. In light of his recent health issues, we will pursue a more conservative approach and follow him clinically. No banding today, as he is doing well. Neuro takes priority at this time. If he were to develop any recurrent issues, he can call and will consider banding then.   Two occasions of solid food dysphagia noted but overall much improved from EGD earlier this year. He is to monitor and call if any persistent issues and would pursue BPE.    PLAN:  Continue PPI BID Call if any recurrent rectal pain or symptomatic hemorrhoids BPE if further persistent dysphagia Return in 6 months  Annitta Needs, PhD, [facility] Gastroenterology

## 2020-05-02 NOTE — Patient Instructions (Signed)
Please let me know if swallowing worsens.  Call us if you have any rectal pain, bleeding, itching, and we can discuss banding.  We will see you in 6 months!  I enjoyed seeing you again today! As you know, I value our relationship and want to provide genuine, compassionate, and quality care. I welcome your feedback. If you receive a survey regarding your visit,  I greatly appreciate you taking time to fill this out. See you next time!  Annitta Needs, PhD, ANP-BC [facility] Gastroenterology

## 2020-05-03 ENCOUNTER — Ambulatory Visit: Payer: Self-pay | Admitting: Physician Assistant

## 2020-05-03 ENCOUNTER — Encounter: Payer: Self-pay | Admitting: Physician Assistant

## 2020-05-03 VITALS — BP 124/88 | HR 60 | Temp 97.9°F

## 2020-05-03 DIAGNOSIS — I10 Essential (primary) hypertension: Secondary | ICD-10-CM

## 2020-05-03 DIAGNOSIS — K219 Gastro-esophageal reflux disease without esophagitis: Secondary | ICD-10-CM

## 2020-05-03 DIAGNOSIS — F17200 Nicotine dependence, unspecified, uncomplicated: Secondary | ICD-10-CM

## 2020-05-03 DIAGNOSIS — E785 Hyperlipidemia, unspecified: Secondary | ICD-10-CM

## 2020-05-03 DIAGNOSIS — J449 Chronic obstructive pulmonary disease, unspecified: Secondary | ICD-10-CM

## 2020-05-03 DIAGNOSIS — Z8673 Personal history of transient ischemic attack (TIA), and cerebral infarction without residual deficits: Secondary | ICD-10-CM

## 2020-05-03 NOTE — Progress Notes (Signed)
BP 124/88   Pulse 60   Temp 97.9 F (36.6 C)   SpO2 99%    Subjective:    Patient ID: [name], male    DOB: 1966/08/16, 54 y.o.   MRN: [id number]  HPI: [name] is a 54 y.o. male presenting on 05/03/2020 for No chief complaint on file.   HPI   Pt had a negative covid 19 screening questionnaire.   Pt is 54yoM who presents for routine follow up.  Pt was scheduled to follow up in March but he cancelled the appointment and never rescheduled it until now.   He was Seen by GI yesterday for hemorrhoids and anal fissure.  Pt in hospital recently for TIA, cannibis abuse, HTN, GERD, copd, cervical radiulocpathy, dyslipiedmia.  He gets insurance starting september 1  He is not using his dulera but he has it.  He got covid vaccination but doesn't have his card.  He says he Still feels cloudy headed.  No spinning or dizziness.  He says this started with his recent admission for TIA.    He Is working on quitting smoking.  He is down to 6/.day  Pt is not having any CP, sob, HA or new abdominal pain.    Relevant past medical, surgical, family and social history reviewed and updated as indicated. Interim medical history since our last visit reviewed. Allergies and medications reviewed and updated.   Current Outpatient Medications:  .  albuterol (PROVENTIL HFA;VENTOLIN HFA) 108 (90 Base) MCG/ACT inhaler, Inhale 2 puffs into the lungs every 6 (six) hours as needed for wheezing or shortness of breath., Disp: , Rfl:  .  amLODipine (NORVASC) 5 MG tablet, Take 1 tablet (5 mg total) by mouth daily., Disp: 30 tablet, Rfl: 1 .  aspirin EC 81 MG EC tablet, Take 1 tablet (81 mg total) by mouth daily. Swallow whole., Disp: 30 tablet, Rfl: 11 .  busPIRone (BUSPAR) 10 MG tablet, Take 10 mg by mouth 3 (three) times daily. , Disp: , Rfl:  .  cloNIDine (CATAPRES) 0.1 MG tablet, Take 2 tablets (0.2 mg total) by mouth 3 (three) times daily., Disp: 90 tablet, Rfl: 1 .  gabapentin  (NEURONTIN) 100 MG capsule, Take 1 capsule (100 mg total) by mouth 3 (three) times daily., Disp: 90 capsule, Rfl: 2 .  meclizine (ANTIVERT) 25 MG tablet, Take 1 tablet (25 mg total) by mouth 3 (three) times daily as needed for dizziness., Disp: 30 tablet, Rfl: 0 .  pantoprazole (PROTONIX) 40 MG tablet, Take 1 tablet (40 mg total) by mouth 2 (two) times daily., Disp: 60 tablet, Rfl: 1 .  pravastatin (PRAVACHOL) 40 MG tablet, Take 1 tablet (40 mg total) by mouth every evening., Disp: 30 tablet, Rfl: 4 .  mometasone-formoterol (DULERA) 100-5 MCG/ACT AERO, Inhale 2 puffs into the lungs daily.  (Patient not taking: Reported on 05/03/2020), Disp: , Rfl:     Review of Systems  Per HPI unless specifically indicated above     Objective:    BP 124/88   Pulse 60   Temp 97.9 F (36.6 C)   SpO2 99%   Wt Readings from Last 3 Encounters:  05/02/20 194 lb 12.8 oz (88.4 kg)  04/30/20 170 lb (77.1 kg)  04/25/20 186 lb 8.2 oz (84.6 kg)    Physical Exam Vitals reviewed.  Constitutional:      General: He is not in acute distress.    Appearance: He is well-developed.  HENT:     Head: Normocephalic  and atraumatic.  Cardiovascular:     Rate and Rhythm: Normal rate and regular rhythm.  Pulmonary:     Effort: Pulmonary effort is normal.     Breath sounds: Normal breath sounds. No wheezing.  Abdominal:     General: Bowel sounds are normal.     Palpations: Abdomen is soft.     Tenderness: There is abdominal tenderness. There is no guarding or rebound.     Comments: Multiple well-healed scars.  Pt reports that abdomen is it's usual soreness, nothing is new.  He says he has it chronically due to his previous surgeries.  Musculoskeletal:     Cervical back: Neck supple.     Right lower leg: No edema.     Left lower leg: No edema.  Lymphadenopathy:     Cervical: No cervical adenopathy.  Skin:    General: Skin is warm and dry.  Neurological:     Mental Status: He is alert and oriented to person,  place, and time.     Motor: No weakness or tremor.  Psychiatric:        Behavior: Behavior normal.           Assessment & Plan:   Encounter Diagnoses  Name Primary?  . Essential hypertension Yes  . Chronic obstructive pulmonary disease, unspecified COPD type ([place])   . Tobacco use disorder   . Hyperlipidemia, unspecified hyperlipidemia type   . Gastroesophageal reflux disease, unspecified whether esophagitis present   . History of transient ischemic attack (TIA)       -discussed possible evaluation by neurology but will not be able to refer since his insurance starts next week.  Discussed that he should talk with his new PCP about his symptoms -pt is aware that he will need new PCP as his insurance starts next week.  He is encouraged to go ahead now and get appointment with new provider -counseled pt that he should be using his dulera daily for his copd.  The albuterol is to be used prn.  He states understanding.   -encouraged smoking cessation -covid vaccination records obtained and entered into Epic -no changes to medications today.  Pt to continue current meds

## 2020-05-17 ENCOUNTER — Ambulatory Visit
Admission: EM | Admit: 2020-05-17 | Discharge: 2020-05-17 | Disposition: A | Discharge status: Home / Self Care | Payer: 59 | Attending: Emergency Medicine | Admitting: Emergency Medicine

## 2020-05-17 DIAGNOSIS — H8110 Benign paroxysmal vertigo, unspecified ear: Secondary | ICD-10-CM

## 2020-05-17 DIAGNOSIS — R42 Dizziness and giddiness: Secondary | ICD-10-CM

## 2020-05-17 MED ORDER — MECLIZINE HCL 25 MG PO TABS: 25.0000 mg | ORAL_TABLET | Freq: Three times a day (TID) | ORAL | 0 refills | Status: DC | PRN

## 2020-05-17 MED ORDER — PREDNISONE 10 MG PO TABS: ORAL_TABLET | ORAL | 0 refills | Status: DC

## 2020-05-17 NOTE — ED Provider Notes (Signed)
[facility]    CSN: [id number] Arrival date & time: 05/17/20  0915      History   Chief Complaint Chief Complaint  Patient presents with   Dizziness    HPI HODARI CHUBA is a 54 y.o. male history of COPD, marijuana use, GERD, renal insufficiency, recent TIA presenting today for evaluation of dizziness.  Patient has had dizziness for the past 3 weeks.  Symptoms have been persistent since he was briefly admitted to the emergency room and evaluated for possible TIA.  Has had multiple MRIs which have been negative.  Since she has had sensations of spinning, disequilibrium and not feeling off balance.  Denies associated nausea or vomiting.  Symptoms worsen with position changes.  Has subsequently had repeat evaluation in emergency room on 8/23 with repeat negative MRI.  Prescribed meclizine which does ease symptoms off, but does not fully resolve.  Recommended to follow-up with ENT, currently does not have primary care and has been unable to obtain referral.  Symptoms eased off briefly for couple days but then returned again on Saturday.  He has also had associated aches.  Denies fevers or URI symptoms. Denies chest pain.  Denies any worsening weakness, continues to have some paresthesias and weakness in his upper extremities, right greater than left, this is been persistent.  HPI  Past Medical History:  Diagnosis Date   Acute pancreatitis    Anxiety    Arthritis    Cancer ([place])    Adenoma in colon   Cannabis abuse    COPD (chronic obstructive pulmonary disease) ([place])    Depression    Diverticulitis of colon with perforation 08/2010   Dyspnea    GERD (gastroesophageal reflux disease)    History of hiatal hernia    Hypertension    taken off lisinopril by renal md 08/2011   Renal insufficiency    follwed by Dr. 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   Chronic obstructive pulmonary disease ([place]) 12/14/2017   Esophageal dysphagia 09/24/2017   History of reversal of ileostomy 09/24/2017   History of colostomy reversal 09/24/2017   History of colonic polyps 09/24/2017   Head ache 05/06/2012   Chronic abdominal pain 05/06/2012   Vomiting 05/06/2012   GERD (gastroesophageal reflux disease) 03/15/2012   Gastritis and duodenitis 03/15/2012   Chronic generalized abdominal pain 02/10/2012   Epigastric pain 02/10/2012   Nausea 02/10/2012   Diverticulitis of colon with perforation 02/10/2012   ANXIETY 10/05/2009   Cannabis abuse 10/05/2009   HTN (hypertension) 10/05/2009   ACUTE PANCREATITIS 10/05/2009   HEMATEMESIS 10/05/2009   HEMATOCHEZIA 10/05/2009    Past Surgical History:  Procedure Laterality Date   APPENDECTOMY     BIOPSY  12/14/2017   Procedure: BIOPSY;  Surgeon: Daneil Dolin, MD;  [facility];  Service: Endoscopy;;  gastric    BIOPSY  10/13/2019   Procedure: BIOPSY;  Surgeon: Daneil Dolin, MD;  [facility];  Service: Endoscopy;;  esophagus   clamp left in colon after abd surgery     COLON SURGERY  05/2011   [facility]. exp laparotomy with colostomy takedown, lysis of adhesions (3hours), loop ileostomy. complicated by abscess formation requiring percutaneous drainage   colonoscopy via stoma with endoscopy of [name]  04/2011   [facility]. two sessile polyps in trv colon. complete resection but only partial retrieval. congested mucosa in area 3cm proximal to stoma, erythematous mucosa in Hartmann pouch. repeat tcs 8/2017path-adenomatous polyp    COLONOSCOPY WITH PROPOFOL N/A 12/14/2017   Dr. Gala Romney: 2 4 to 6 mm polyps in the cecum, tubular adenomas, single rectal polyp tubular adenoma, nonbleeding internal hemorrhoids.  Appears to be a surgical clip at the appendiceal orifice.  Next colonoscopy in 5 years   ESOPHAGOGASTRODUODENOSCOPY  03/05/2012   erosive reflux esophagitis, bulbar erosions, gastric erosions/petichae, bx showed gastritis, no H.Pylori. Procedure: ESOPHAGOGASTRODUODENOSCOPY (EGD);  Surgeon: Daneil Dolin, MD;  [facility];  Service: Endoscopy;  Laterality: N/A;  11:30   ESOPHAGOGASTRODUODENOSCOPY (EGD) WITH PROPOFOL N/A 12/14/2017   Dr. Gala Romney: Erosive reflux esophagitis with questionable mild stricturing/non-obstruction at this level status post dilation.  multiple gastric erosions in the stomach biopsy showed reactive gastropathy but no H. pylori.   ESOPHAGOGASTRODUODENOSCOPY (EGD) WITH PROPOFOL N/A 10/13/2019   negative Barrett's, normal stomach   HERNIA REPAIR     KNEE SURGERY Right 2006   MALONEY DILATION N/A 12/14/2017   Procedure: Venia Minks DILATION;  Surgeon: Daneil Dolin, MD;  [facility];  Service: Endoscopy;  Laterality: N/A;   MULTIPLE EXTRACTIONS WITH ALVEOLOPLASTY  02/23/2012   Procedure: MULTIPLE EXTRACION WITH ALVEOLOPLASTY;  Surgeon: Gae Bon, DDS;  Location: [place];  Service: Oral Surgery;  Laterality: Bilateral;  Removal of left mandibular torus, multiple extractions with alveoloplasty   POLYPECTOMY  12/14/2017   Procedure: POLYPECTOMY;  Surgeon: Daneil Dolin, MD;  [facility];  Service: Endoscopy;;   reversal of colostomy  10/2011   per patient, did not have report   right knee surgery     sigmoid colectomy with end colostomy, Hartmann's pouch  08/2010   [facility]. complicated diverticulitis   UMBILICAL HERNIA REPAIR         Home Medications    Prior to Admission medications   Medication Sig Start Date End Date Taking? Authorizing Provider    albuterol (PROVENTIL HFA;VENTOLIN HFA) 108 (90 Base) MCG/ACT inhaler Inhale 2 puffs into the lungs every 6 (six) hours as needed for wheezing or shortness of breath.    [provider]  amLODipine (NORVASC) 5 MG tablet Take 1 tablet (5 mg total) by mouth daily. 04/27/20   Barton Dubois, MD  aspirin EC 81 MG EC tablet Take 1 tablet (81 mg total) by mouth daily. Swallow whole. 04/27/20   Barton Dubois, MD  busPIRone (BUSPAR) 10 MG tablet Take 10 mg by mouth 3 (three) times daily.     [provider]  cloNIDine (CATAPRES) 0.1 MG tablet Take 2 tablets (0.2 mg total) by mouth 3 (three) times daily. 04/26/20   Barton Dubois, MD  gabapentin (NEURONTIN) 100 MG capsule Take 1 capsule (100 mg total) by mouth 3 (three) times daily. 04/26/20 04/26/21  Barton Dubois, MD  meclizine (ANTIVERT) 25 MG tablet Take 1 tablet (25 mg total) by mouth 3 (three) times daily as needed for dizziness. 05/17/20   Heidi Maclin C, PA-C  mometasone-formoterol (DULERA) 100-5 MCG/ACT AERO Inhale 2 puffs into the lungs daily.  Patient not taking: Reported  on 05/03/2020    [provider]  pantoprazole (PROTONIX) 40 MG tablet Take 1 tablet (40 mg total) by mouth 2 (two) times daily. 04/26/20   Barton Dubois, MD  pravastatin (PRAVACHOL) 40 MG tablet Take 1 tablet (40 mg total) by mouth every evening. 04/26/20 04/26/21  Barton Dubois, MD  predniSONE (DELTASONE) 10 MG tablet Begin with 6 tabs on day 1, 5 tab on day 2, 4 tab on day 3, 3 tab on day 4, 2 tab on day 5, 1 tab on day 6-take with food 05/17/20   Tyshae Stair, [place] C, PA-C    Family History Family History  Problem Relation Age of Onset   Stomach cancer Father    Lung cancer Father    Throat cancer Father    Cancer Father        lung and throat cancer   Hypertension Father    Liver disease Cousin        cirrhosis, etoh   Throat cancer Paternal Uncle    Lung cancer Paternal Uncle    Colon cancer Cousin 52   Heart disease Mother     Hypertension Mother    Colon cancer Maternal Uncle    Throat cancer Paternal Uncle    Lung cancer Paternal Uncle    Throat cancer Paternal Uncle    Lung cancer Paternal Uncle     Social History Social History   Tobacco Use   Smoking status: Current Every Day Smoker    Packs/day: 1.00    Years: 37.00    Pack years: 37.00    Types: Cigarettes   Smokeless tobacco: Former Systems developer    Types: Snuff   Tobacco comment: dips some trying to quit smoking  Vaping Use   Vaping Use: Never used  Substance Use Topics   Alcohol use: Not Currently   Drug use: Yes    Frequency: 7.0 times per week    Types: Marijuana    Comment: none since last week     Allergies   Codeine, Latex, Lisinopril, Morphine, and Adhesive [tape]   Review of Systems Review of Systems  Constitutional: Negative for fatigue and fever.  HENT: Negative for congestion, sinus pressure and sore throat.   Eyes: Negative for photophobia, pain and visual disturbance.  Respiratory: Negative for cough and shortness of breath.   Cardiovascular: Negative for chest pain.  Gastrointestinal: Negative for abdominal pain, nausea and vomiting.  Genitourinary: Negative for decreased urine volume and hematuria.  Musculoskeletal: Positive for myalgias. Negative for neck pain and neck stiffness.  Neurological: Positive for dizziness and headaches. Negative for syncope, facial asymmetry, speech difficulty, weakness, light-headedness and numbness.     Physical Exam Triage Vital Signs ED Triage Vitals [05/17/20 0948]  Enc Vitals Group     BP (!) 136/94     Pulse Rate (!) 57     Resp 20     Temp 97.9 F (36.6 C)     Temp src      SpO2 97 %     Weight      Height      Head Circumference      Peak Flow      Pain Score 8     Pain Loc      Pain Edu?      Excl. in [place]?    Orthostatic VS for the past 24 hrs:  BP- Lying Pulse- Lying BP- Sitting Pulse- Sitting BP- Standing at 0 minutes Pulse- Standing at 0 minutes  05/17/20 0950 135/88 67 146/90 62 (!) 157/102 70    Updated Vital Signs BP (!) 136/94    Pulse (!) 57    Temp 97.9 F (36.6 C)    Resp 20    SpO2 97%   Visual Acuity Right Eye Distance:   Left Eye Distance:   Bilateral Distance:    Right Eye Near:   Left Eye Near:    Bilateral Near:     Physical Exam Vitals and nursing note reviewed.  Constitutional:      Appearance: He is well-developed.     Comments: No acute distress  HENT:     Head: Normocephalic and atraumatic.     Ears:     Comments: Bilateral ears without tenderness to palpation of external auricle, tragus and mastoid, EAC's without erythema or swelling, TM's with good bony landmarks and cone of light. Non erythematous.     Nose: Nose normal.     Mouth/Throat:     Comments: Oral mucosa pink and moist, no tonsillar enlargement or exudate. Posterior pharynx patent and nonerythematous, no uvula deviation or swelling. Normal phonation. Eyes:     Extraocular Movements: Extraocular movements intact.     Conjunctiva/sclera: Conjunctivae normal.     Pupils: Pupils are equal, round, and reactive to light.     Comments: No nystagmus  Cardiovascular:     Rate and Rhythm: Normal rate and regular rhythm.     Heart sounds: Normal heart sounds.  Pulmonary:     Effort: Pulmonary effort is normal. No respiratory distress.     Comments: Breathing comfortably at rest, CTABL, no wheezing, rales or other adventitious sounds auscultated Abdominal:     General: There is no distension.  Musculoskeletal:        General: Normal range of motion.     Cervical back: Neck supple.  Skin:    General: Skin is warm and dry.  Neurological:     Mental Status: He is alert and oriented to person, place, and time.     Comments: Patient A&O x3, cranial nerves II-XII grossly intact, strength at shoulders, hips and knees 5/5, equal bilaterally, patellar reflex 1+ bilaterally. RAM slightly slowed bilaterally, normal discharge for this heel to shin.  Unable to perform Romber via g due to balance. Gait unstable.      UC Treatments / Results  Labs (all labs ordered are listed, but only abnormal results are displayed) Labs Reviewed - No data to display  EKG   Radiology No results found.  Procedures Procedures (including critical care time)  Medications Ordered in UC Medications - No data to display  Initial Impression / Assessment and Plan / UC Course  I have reviewed the triage vital signs and the nursing notes.  Pertinent labs & imaging results that were available during my care of the patient were reviewed by me and considered in my medical decision making (see chart for details).     Prior extensive work-up negative for intracranial abnormality.  Patient is very dizzy and unstable on his feet, but no recent changes or worsening of symptoms or weakness.  Recommending to follow-up with ENT.  Will continue meclizine, trial of prednisone, referral attempted to be placed for ENT, but also stressed importance of setting up with primary care for further referrals if needed.  Discussed strict return precautions. Patient verbalized understanding and is agreeable with plan.  Final Clinical Impressions(s) / UC Diagnoses   Final diagnoses:  Dizziness  Benign paroxysmal positional vertigo, unspecified laterality  Discharge Instructions     Prednisone tape x 6 days Meclizine as needed Establish care with primary care- call contact below  Referral to ENT placed- they may call you Follow up if worsening/changing    ED Prescriptions    Medication Sig Dispense Auth. Provider   predniSONE (DELTASONE) 10 MG tablet Begin with 6 tabs on day 1, 5 tab on day 2, 4 tab on day 3, 3 tab on day 4, 2 tab on day 5, 1 tab on day 6-take with food 21 tablet Johnrobert Foti C, PA-C   meclizine (ANTIVERT) 25 MG tablet Take 1 tablet (25 mg total) by mouth 3 (three) times daily as needed for dizziness. 30 tablet Anddy Wingert, [place] C, PA-C      PDMP not reviewed this encounter.   Lashun Ramseyer, [place] C, PA-C 05/17/20 1045

## 2020-05-17 NOTE — Discharge Instructions (Signed)
Prednisone tape x 6 days Meclizine as needed Establish care with primary care- call contact below  Referral to ENT placed- they may call you Follow up if worsening/changing

## 2020-05-17 NOTE — ED Triage Notes (Signed)
Pt presents with continues dizziness since being DC from hospital , pt also has c/o fatigue and headache

## 2020-05-23 ENCOUNTER — Ambulatory Visit (INDEPENDENT_AMBULATORY_CARE_PROVIDER_SITE_OTHER): Payer: 59 | Admitting: Student

## 2020-05-23 ENCOUNTER — Encounter: Payer: Self-pay | Admitting: Student

## 2020-05-23 ENCOUNTER — Other Ambulatory Visit: Payer: Self-pay

## 2020-05-23 VITALS — BP 141/111 | HR 81 | Temp 98.3°F | Ht 67.0 in | Wt 188.9 lb

## 2020-05-23 DIAGNOSIS — M7918 Myalgia, other site: Secondary | ICD-10-CM

## 2020-05-23 DIAGNOSIS — F17210 Nicotine dependence, cigarettes, uncomplicated: Secondary | ICD-10-CM

## 2020-05-23 DIAGNOSIS — G459 Transient cerebral ischemic attack, unspecified: Secondary | ICD-10-CM

## 2020-05-23 DIAGNOSIS — Z23 Encounter for immunization: Secondary | ICD-10-CM | POA: Diagnosis not present

## 2020-05-23 DIAGNOSIS — J449 Chronic obstructive pulmonary disease, unspecified: Secondary | ICD-10-CM

## 2020-05-23 DIAGNOSIS — M2559 Pain in other specified joint: Secondary | ICD-10-CM

## 2020-05-23 DIAGNOSIS — K219 Gastro-esophageal reflux disease without esophagitis: Secondary | ICD-10-CM

## 2020-05-23 DIAGNOSIS — E785 Hyperlipidemia, unspecified: Secondary | ICD-10-CM | POA: Diagnosis not present

## 2020-05-23 DIAGNOSIS — I10 Essential (primary) hypertension: Secondary | ICD-10-CM | POA: Diagnosis not present

## 2020-05-23 DIAGNOSIS — Z720 Tobacco use: Secondary | ICD-10-CM

## 2020-05-23 DIAGNOSIS — R52 Pain, unspecified: Secondary | ICD-10-CM | POA: Insufficient documentation

## 2020-05-23 DIAGNOSIS — R42 Dizziness and giddiness: Secondary | ICD-10-CM

## 2020-05-23 NOTE — Assessment & Plan Note (Addendum)
The patient reports he has been struggling with "pain everywhere" since his TIA on 04/25/20. He notes that he generally feels "ok" in the mornings when he wakes up, however shortly after he takes his medicines, his whole body aches, including "all the joints" and muscles. He notes that he has a history of chronic low back pain, however this pain is different. This pain is frustrating him because it is constant, and "nothing makes it better." He states he has wary of trying any OTC pain medications such as ibuprofen or acetaminophen due to his GI history. He was prescribed gabapentin for cervical radiculopathy found on imaging at his recent hospitalization, and he reports it does not bring relief. 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 When asked about his PHQ-9 score of 15, he shares that his depressed mood, decreased energy, and anhedonia stem from this discomfort.  He denies any recent fevers or chills. States he has had tingling in his bilateral hands "for years." Denies loss of bowel or bladder function.  When asked if he has ever had a reaction to a statin previously, he reports he recalls tolerating it poorly, however he cannot recall the specific reaction he had.  On exam, there is diffuse tenderness to palpation of muscles, including bilateral calves, thighs, arms, and paraspinal muscles. Tenderness of joints diffusely without erythema or edema. His strength exam is limited secondary to pain.  A/P: The nature of this pain, and its within 36-monthof starting a statin raises my concern for statin-related muscle symptoms. Will obtain labs to evaluate for muscle breakdown and inflammation while holding statin. - CK, ESR, CRP - Instructed patient to stop taking pravastatin and report back if his symptoms do not improve - Advised patient that with his GI history it is safe for him to take Tylenol 1 g TID for his  pain  ADDENDUM: - CK, ESR, and CRP all within normal limits - Called patient, and he states his pain is significantly improved since stopping the pravastatin. Instructed him to continue to hold and call to schedule follow-up appointment for 1 month out.

## 2020-05-23 NOTE — Assessment & Plan Note (Signed)
BP today 132/116, 140/111 when repeated after 20 minutes. Patient reports he checks his AM blood pressure at home periodically and notes that his pressures range 100-130/70s-100s. He reports he takes clonidine 0.2 mg TID and amlodipine 5 mg daily. He notes that he had just smoked a cigarette prior to coming into clinic.  On chart review, it appears amlodipine was just added to his regimen when he was admitted for TIA 04/25/20-04/26/20.  A/P: The patient's reported diffuse body pain and dizziness were the greater priority during this visit.  - Continue clonidine 0.2 mg TID - Continue amlodipine 5 mg daily - Advised patient to daily his BP daily in the morning at home for review at next visit - His antihypertensives will likely need to be adjusted at his next visit

## 2020-05-23 NOTE — Assessment & Plan Note (Addendum)
Last lipid panel during admission for TIA on 04/25/20 demonstrated:  Cholesterol 0 - 200 mg/dL 196   Triglycerides <150 mg/dL 153High   HDL >40 mg/dL 39Low   Total CHOL/HDL Ratio RATIO 5.0   VLDL 0 - 40 mg/dL 31   LDL Cholesterol 0 - 99 mg/dL 126High    Patient was initiated on pravastatin 40 mg daily which he has taken nightly over the last month. He reports a remote history of poor statin tolerance. Due to concern for statin-related muscle symptoms, instructed patient to hold this for now.  - HOLD pravastatin 40 mg

## 2020-05-23 NOTE — Assessment & Plan Note (Signed)
Patient reports a significant smoking history: 2 ppd for ~15 years. Reports since his TIA on 04/25/20, he has cut back to 1 ppd. Called a quit line and received nicotine patches and lozenges which he reports he is planning to start shortly.  - Follow-up progress on smoking cessation at each visit

## 2020-05-23 NOTE — Assessment & Plan Note (Signed)
The patient reports ongoing dizziness, lightheadedness, and "foggy-headedness" since his admission for TIA on 04/25/20.   He was evaluated for dizziness first on 04/30/20 in the ED. During that visit, teleneurology was consulted, and repeat head MRI was negative for acute stroke. He was treated with meclizine, acetaminophen for mild headache, and ativan with resolution of his symptoms. His symptoms were presumed to be secondary to peripheral vertigo, and he was discharged home with meclizine and ENT referral.  He presented ~2 weeks later on 05/17/20 to an urgent care for similar symptoms and discharged with meclizine, trial of prednisone, and repeat referral to ENT. Patient reported he was unable to obtain referral secondary to not having a PCP.    Today, the patient reports that the meclizine is not helping. He states that he usually wakes up in the morning "feeling fine," however after he takes most of his medicines, he starts to feel diffuse pain, dizziness, and lightheadedness. He notes that the dizziness is brought on with position changes, and staying completely still is the only thing that really helps.  A/P: The patient's head imaging likely rules out central causes of vertigo. The onset of his symptoms with position changes raises suspicion for BPPV. Dix-Hallpike was not performed today. - Gave patient a handout for instructions on the Epley maneuver to test our hypothesis of BPPV

## 2020-05-23 NOTE — Progress Notes (Signed)
   CC: "I hurt everywhere," "I need a new PCP."  HPI:  [name] is a 54 y.o. person with history of recent admission for TIA (04/25/20-04/26/20), COPD, HTN, HLD, GERD, hemorrhoids, anal fissure who presents to clinic to establish care. He last saw his previous PCP at [facility] in [place] on 05/03/20.  A brief timeline of his several medical visits in the last month, starting most recently: - 05/17/20 visit to urgent care where he was treated for BPPV with prednisone taper, meclizine PRN, ENT referral - 05/03/20 visit to previous PCP - 05/02/20 visit to his GI physician for follow-up of hemorrhoids, anal fissure, and dysphagia - 04/30/20 visit to ED with vertiginous symptoms, MRI negative for stroke, diagnosed with peripheral vertigo and prescribed meclizine - 04/25/20-04/26/20 admission to hospital for TIA   To see the details of this patient's management of their acute and chronic problems, please refer to the Assessment & Plan under the Encounters tab.    Past Medical History:  Diagnosis Date  . Acute pancreatitis   . Anxiety   . Arthritis   . Cancer ([place])    Adenoma in colon  . Cannabis abuse   . COPD (chronic obstructive pulmonary disease) ([place])   . Depression   . Diverticulitis of colon with perforation 08/2010  . Dyspnea   . GERD (gastroesophageal reflux disease)   . History of hiatal hernia   . Hypertension    taken off lisinopril by renal md 08/2011  . Renal insufficiency    follwed by Dr. Hinda Lenis  . Sleep apnea    does not use CPAP, cannot tolerate  . Stroke [facility])    TIA   Review of Systems:    Review of Systems  Constitutional: Positive for malaise/fatigue.  Respiratory: Negative for shortness of breath and wheezing.   Cardiovascular: Negative for chest pain and palpitations.  Gastrointestinal: Negative for abdominal pain, nausea and vomiting.  Musculoskeletal: Positive for back pain, joint pain and myalgias.  Neurological: Positive for dizziness and  headaches. Negative for focal weakness.    Physical Exam:  Vitals:   05/23/20 1359 05/23/20 1434  BP: (!) 132/116 (!) 141/111  Pulse: 76 81  Temp: 98.3 F (36.8 C)   TempSrc: Oral   SpO2: 100%   Weight: 188 lb 14.4 oz (85.7 kg)   Height: 5\' 7"  (1.702 m)    Constitutional: Tired-appearing gentleman who appears older than stated age, slouching in chair, in mild to moderate distress, stating, "I hurt all over." HENT: normocephalic atraumatic Eyes: conjunctivae non-erythematous Neck: supple Cardiovascular: regular rate and rhythm, no m/r/g Pulmonary/Chest: normal work of breathing on room air, clear to auscultation bilaterally MSK: diffuse tenderness to palpation of muscles, including bilateral calves, thighs, arms, and paraspinal muscles. Tenderness of joints diffusely without erythema or edema. Neurological: alert & oriented x 3; 4+/5 strength in bilateral upper and lower extremities, seems pain-limited; gait is normal    Office Visit from 05/23/2020 in [place]  PHQ-9 Total Score 15      Assessment & Plan:   See Encounters Tab for problem based charting.  Patient seen with Dr. Rebeca Alert

## 2020-05-23 NOTE — Assessment & Plan Note (Signed)
The patient presented to the [facility] ED on 04/25/20 with right-sided weakness and numbness as well as dizziness. MRI brain with no acute CVA, his blood pressure was uncontrolled despite reported adherence to his clonidine, urine drug screen was positive for THC and benzodiazepine. He was admitted for TIA work-up. 2D echo without signs of acute thrombi or abnormality. He was started on aspirin 81 mg for secondary prevention as well as pravastatin 40 mg. A1c 5.5%. He was counseled regarding smoking cessation. Outpatient follow-up with Dr. Merlene Laughter (neurology) was recommended.  A/P: The patient has several risk factors for stroke/TIA, including HTN, smoking, hyperlipidemia, age.  - Smoking cessation: patient reports he has cut back from 2 ppd to 1 ppd over the last month and after calling a quit line, has obtained nicotine patches and lozenges which he plans on using - HTN: uncontrolled; we will continue to work on this - HLD: patient instructed to STOP his statin at today's visit secondary to concern for possible statin-related muscle symptoms - Continue ASA 81 mg for secondary prevention - Consider neurology follow-up with Dr. Merlene Laughter

## 2020-05-23 NOTE — Patient Instructions (Addendum)
[name],   Thank you for your visit to the [facility] today. It was a pleasure seeing you. Today we discussed the following:  1) Generalized pain: This pain may be due to the cholesterol-lowering medication (statin) you are taking.  - Please STOP taking your pravastatin 40 mg daily - We are also getting blood work today to evaluate for inflammation and/or muscle breakdown in your body: CK, ESR, CRP. I will call you if these are abnormal. - You may take acetaminophen (Tylenol) 1,000 mg three times daily for the pain - Please give our office a call if you are still having significant pain.  2) Dizziness: This is likely from a condition called benign positional peripheral vertigo (BPPV).  - Below are instructions for performing the Epley maneuver which will hopefully relieve your symptoms  3) Smoking cessation:  - Good for you for cutting back on your smoking!  - Recommend you start using the patch and lozenges you have. - We will keep checking in on your progress with this.  4) Hypertension: - Check your blood pressure once in the morning and record these numbers so that we can review them at your next visit. - In the meantime, continue taking your clonidine and amlodipine.   We would like to see you back in [id number] follow-up on these issues as well as your other chronic medical problems. Please bring all of your medications with you.   If you have any questions or concerns, please call our clinic at [phone] 9am-5pm. Outside of these hours, call [phone] ask for the internal medicine resident on call. If you feel you are having a medical emergency please call 911.    How to Perform the Epley Maneuver The Epley maneuver is an exercise that relieves symptoms of vertigo. Vertigo is the feeling that you or your surroundings are moving when they are not. When you feel vertigo, you may feel like the room is spinning and have trouble walking.  Dizziness is a little different than vertigo. When you are dizzy, you may feel unsteady or light-headed. You can do this maneuver at home whenever you have symptoms of vertigo. You can do it up to 3 times a day until your symptoms go away. Even though the Epley maneuver may relieve your vertigo for a few weeks, it is possible that your symptoms will return. This maneuver relieves vertigo, but it does not relieve dizziness. What are the risks? If it is done correctly, the Epley maneuver is considered safe. Sometimes it can lead to dizziness or nausea that goes away after a short time. If you develop other symptoms, such as changes in vision, weakness, or numbness, stop doing the maneuver and call your health care provider. How to perform the Epley maneuver 1. Sit on the edge of a bed or table with your back straight and your legs extended or hanging over the edge of the bed or table. 2. Turn your head halfway toward the affected ear or side. 3. Lie backward quickly with your head turned until you are lying flat on your back. You may want to position a pillow under your shoulders. 4. Hold this position for 30 seconds. You may experience an attack of vertigo. This is normal. 5. Turn your head to the opposite direction until your unaffected ear is facing the floor. 6. Hold this position for 30 seconds. You may experience an attack of vertigo. This is normal. Hold this position until the  vertigo stops. 7. Turn your whole body to the same side as your head. Hold for another 30 seconds. 8. Sit back up. You can repeat this exercise up to 3 times a day. Follow these instructions at home:  After doing the Epley maneuver, you can return to your normal activities.  Ask your health care provider if there is anything you should do at home to prevent vertigo. He or she may recommend that you: ? Keep your head raised (elevated) with two or more pillows while you sleep. ? Do not sleep on the side of your  affected ear. ? Get up slowly from bed. ? Avoid sudden movements during the day. ? Avoid extreme head movement, like looking up or bending over. Contact a health care provider if:  Your vertigo gets worse.  You have other symptoms, including: ? Nausea. ? Vomiting. ? Headache. Get help right away if:  You have vision changes.  You have a severe or worsening headache or neck pain.  You cannot stop vomiting.  You have new numbness or weakness in any part of your body. Summary  Vertigo is the feeling that you or your surroundings are moving when they are not.  The Epley maneuver is an exercise that relieves symptoms of vertigo.  If the Epley maneuver is done correctly, it is considered safe. You can do it up to 3 times a day. This information is not intended to replace advice given to you by your health care provider. Make sure you discuss any questions you have with your health care provider. Document Revised: 08/07/2017 Document Reviewed: 07/15/2016 Elsevier Patient Education  2020 [employer].

## 2020-05-23 NOTE — Assessment & Plan Note (Signed)
Patient follows with GI. Well-managed on current regimen.  - Continue pantoprazole 40 mg BID

## 2020-05-24 LAB — CMP14 + ANION GAP
ALT: 14 IU/L (ref 0–44)
AST: 11 IU/L (ref 0–40)
Albumin/Globulin Ratio: 1.5 (ref 1.2–2.2)
Albumin: 4.3 g/dL (ref 3.8–4.9)
Alkaline Phosphatase: 97 IU/L (ref 44–121)
Anion Gap: 16 mmol/L (ref 10.0–18.0)
BUN/Creatinine Ratio: 17 (ref 9–20)
BUN: 18 mg/dL (ref 6–24)
Bilirubin Total: 0.2 mg/dL (ref 0.0–1.2)
CO2: 21 mmol/L (ref 20–29)
Calcium: 9 mg/dL (ref 8.7–10.2)
Chloride: 106 mmol/L (ref 96–106)
Creatinine, Ser: 1.08 mg/dL (ref 0.76–1.27)
GFR calc Af Amer: 89 mL/min/{1.73_m2} (ref >59)
GFR calc non Af Amer: 77 mL/min/{1.73_m2} (ref >59)
Globulin, Total: 2.9 g/dL (ref 1.5–4.5)
Glucose: 86 mg/dL (ref 65–99)
Potassium: 4.1 mmol/L (ref 3.5–5.2)
Sodium: 143 mmol/L (ref 134–144)
Total Protein: 7.2 g/dL (ref 6.0–8.5)

## 2020-05-24 LAB — SEDIMENTATION RATE: Sed Rate: 9 mm/hr (ref 0–30)

## 2020-05-24 LAB — C-REACTIVE PROTEIN: CRP: 1 mg/L (ref 0–10)

## 2020-05-24 LAB — CK: Total CK: 30 U/L — ABNORMAL LOW (ref 41–331)

## 2020-05-25 ENCOUNTER — Encounter: Payer: 59 | Admitting: Internal Medicine

## 2020-05-30 MED ORDER — CLONIDINE HCL 0.1 MG PO TABS: 0.2000 mg | ORAL_TABLET | Freq: Three times a day (TID) | ORAL | 1 refills | Status: DC

## 2020-05-30 NOTE — Addendum Note (Signed)
Addended by: Alexandria Lodge on: 05/30/2020 01:08 PM   Modules accepted: Orders

## 2020-06-06 NOTE — Progress Notes (Signed)
Internal Medicine Clinic Attending  I saw and evaluated the patient.  I personally confirmed the key portions of the history and exam documented by Dr. Watson and I reviewed pertinent patient test results.  The assessment, diagnosis, and plan were formulated together and I agree with the documentation in the resident's note.  Dashun Borre, M.D., Ph.D.  

## 2020-06-21 ENCOUNTER — Other Ambulatory Visit: Payer: Self-pay

## 2020-06-21 ENCOUNTER — Ambulatory Visit (INDEPENDENT_AMBULATORY_CARE_PROVIDER_SITE_OTHER): Payer: 59 | Admitting: Student

## 2020-06-21 ENCOUNTER — Telehealth: Payer: Self-pay

## 2020-06-21 ENCOUNTER — Encounter: Payer: Self-pay | Admitting: Student

## 2020-06-21 VITALS — BP 154/109 | HR 59 | Temp 97.7°F | Ht 67.0 in | Wt 192.1 lb

## 2020-06-21 DIAGNOSIS — E785 Hyperlipidemia, unspecified: Secondary | ICD-10-CM | POA: Diagnosis not present

## 2020-06-21 DIAGNOSIS — R42 Dizziness and giddiness: Secondary | ICD-10-CM

## 2020-06-21 DIAGNOSIS — R52 Pain, unspecified: Secondary | ICD-10-CM | POA: Diagnosis not present

## 2020-06-21 DIAGNOSIS — M545 Low back pain, unspecified: Secondary | ICD-10-CM

## 2020-06-21 MED ORDER — CYCLOBENZAPRINE HCL 5 MG PO TABS: 5.0000 mg | ORAL_TABLET | Freq: Every day | ORAL | 0 refills | Status: DC

## 2020-06-21 MED ORDER — EZETIMIBE 10 MG PO TABS: 10.0000 mg | ORAL_TABLET | Freq: Every day | ORAL | 11 refills | Status: DC

## 2020-06-21 MED ORDER — AMLODIPINE BESYLATE 10 MG PO TABS: 10.0000 mg | ORAL_TABLET | Freq: Every day | ORAL | 11 refills | Status: DC

## 2020-06-21 NOTE — Telephone Encounter (Signed)
[name] with [facility] requesting to speak with a nurse about meds. Please call back.

## 2020-06-21 NOTE — Telephone Encounter (Signed)
RTC to [facility] and they need clarification on the cyclobenzaprine RX which was sent earlier:  Sig states to take 1 tablet at bedtime for 7 days, but quantity is for #21.  Please change quantity to #7 or adjust Sig to match quantity and resend new RX. Thanks, SChaplin, RN,BSN

## 2020-06-21 NOTE — Patient Instructions (Addendum)
It was a pleasure seeing you in clinic. Today we discussed:   Back pain: You can take flexeril aft night as needed for back pain. I have also ordered a referral to physical therapy to help with back pain.  Blood pressure: Your blood pressure is high today at 154/90. Please increase amlodipine to 10 mg daily. We can also slowly decrease your clonidine from 3 times a day to 2 times a day. Please follow up in 2 week for a BP recheck.  High cholesterol: Please start taking zetia 10 mg daily.  If you have any questions or concerns, please call our clinic at [phone] between 9am-5pm and after hours call [phone] and ask for the internal medicine resident on call. If you feel you are having a medical emergency please call 911.   Thank you, we look forward to helping you remain healthy!

## 2020-06-22 MED ORDER — CYCLOBENZAPRINE HCL 5 MG PO TABS: 5.0000 mg | ORAL_TABLET | Freq: Every day | ORAL | 0 refills | Status: AC

## 2020-06-22 NOTE — Assessment & Plan Note (Addendum)
Patient unable to tolerate statins. Discontinue pravastatin at last visit due to generalized MSK pain. Had labs at last visit for concern for statin induce myopathy were within normal limits. Now feeling much better off the statin. Given history of TIA and elevated LDL he would still benefit from lipid lowering therapy to prevent decrease risk of future events   Plan  Start Zetia 10 mg daily Consider PCSK9 inhibitor as he cannot tolerate statins.

## 2020-06-22 NOTE — Progress Notes (Signed)
Called pharmacy as e prescribing not available today and corrected rx for cyclobenzeprine 5 mg at bedtime as needed #7.

## 2020-06-22 NOTE — Progress Notes (Signed)
   CC: back pain  HPI:  [name] is a 54 y.o. male with history listed below present today to clinic for follow up. Please refer to problem based charting for further details and assessment and plan of current problem and chronic medical conditions.   Past Medical History:  Diagnosis Date  . Acute pancreatitis   . Anxiety   . Arthritis   . Cancer ([place])    Adenoma in colon  . Cannabis abuse   . COPD (chronic obstructive pulmonary disease) ([place])   . Depression   . Diverticulitis of colon with perforation 08/2010  . Dyspnea   . GERD (gastroesophageal reflux disease)   . History of hiatal hernia   . Hypertension    taken off lisinopril by renal md 08/2011  . Renal insufficiency    follwed by Dr. Hinda Lenis  . Sleep apnea    does not use CPAP, cannot tolerate  . Stroke [facility])    TIA   Review of Systems:  Negative as per HPI.  Physical Exam:  Vitals:   06/21/20 1001 06/21/20 1010  BP: (!) 159/109 (!) 154/109  Pulse: 65 (!) 59  Temp: 97.7 F (36.5 C)   TempSrc: Oral   SpO2: 99%   Weight: 192 lb 1.6 oz (87.1 kg)   Height: 5\' 7"  (1.702 m)    Physical Exam Constitutional:      Appearance: Normal appearance.  Eyes:     Extraocular Movements: Extraocular movements intact.     Pupils: Pupils are equal, round, and reactive to light.  Cardiovascular:     Rate and Rhythm: Normal rate and regular rhythm.     Pulses: Normal pulses.     Heart sounds: Normal heart sounds.  Pulmonary:     Effort: Pulmonary effort is normal.     Breath sounds: Normal breath sounds.  Abdominal:     General: Abdomen is flat. Bowel sounds are normal.     Palpations: Abdomen is soft.  Musculoskeletal:     Cervical back: Normal range of motion.     Comments: TTP to bilateral lower back. No midline tenderness no step off.  Skin:    General: Skin is warm and dry.  Neurological:     General: No focal deficit present.     Mental Status: He is alert and oriented to person, place, and time.  Mental status is at baseline.      Assessment & Plan:   See Encounters Tab for problem based charting.  Patient seen with Dr. Daryll Drown

## 2020-06-22 NOTE — Assessment & Plan Note (Addendum)
He state pain has improved overall since stopping pravastatin, but continues to have low back pain. Pain is worse with stand and bending over and improves with rest. He denies fever, weakness, loss of bowel or bladder function, weight loss, or gait disturbances. Was taking tylenol for pain but this upsets his stomach. On exam bilateral lower back is tender to palpation. States pain is mostly on the right side but also has some left lower back pain.  Plan - Referral to PT - Cyclobenzaprine at night as needed

## 2020-06-22 NOTE — Telephone Encounter (Signed)
Rx resent as  #7

## 2020-06-22 NOTE — Assessment & Plan Note (Signed)
Dizziness much improved after trying Epley maneuvers. Reports now he only 1-2 brief episodes a day associated with head movements. He has follow up with ENT next week.

## 2020-06-22 NOTE — Assessment & Plan Note (Signed)
BP today 159/109 and 154/109 on repeat. States BP at home ranges from 130s to 150s. Denies chest pain, nausea, vomiting, blurred vision, or focal weakness. States he has been taking clonidine 0.2 mg TID and amlodipine 5 mg daily. BP will need to be better controlled given history of recent TIA and multiple risk factors. Will increase amlodipine and start clonidine taper to reduce pill burden.  Plan - increase amlodipine 5 mg daily - decrease clonidine to 0.2 mg twice daily - follow up in 2 weeks

## 2020-06-22 NOTE — Progress Notes (Signed)
Internal Medicine Clinic Attending  I saw and evaluated the patient.  I personally confirmed the key portions of the history and exam documented by Dr. Lisabeth Devoid and I reviewed pertinent patient test results.  The assessment, diagnosis, and plan were formulated together and I agree with the documentation in the resident's note.   Please note that we increased his amlodipine to 10mg  daily.  He will need frequent visits in the near future to get his blood pressure adequately controlled.

## 2020-07-27 ENCOUNTER — Other Ambulatory Visit: Payer: Self-pay | Admitting: Student

## 2020-07-27 ENCOUNTER — Telehealth: Payer: Self-pay | Admitting: Internal Medicine

## 2020-07-27 DIAGNOSIS — I10 Essential (primary) hypertension: Secondary | ICD-10-CM

## 2020-07-27 NOTE — Telephone Encounter (Signed)
Refill Request  pantoprazole (PROTONIX) 40 MG tablet cloNIDine (CATAPRES) 0.1 MG tablet   [place], [place] (Ph: [phone])

## 2020-08-06 ENCOUNTER — Telehealth: Payer: Self-pay | Admitting: Internal Medicine

## 2020-08-06 DIAGNOSIS — R1319 Other dysphagia: Secondary | ICD-10-CM

## 2020-08-06 DIAGNOSIS — K921 Melena: Secondary | ICD-10-CM

## 2020-08-06 DIAGNOSIS — K6289 Other specified diseases of anus and rectum: Secondary | ICD-10-CM

## 2020-08-06 DIAGNOSIS — K219 Gastro-esophageal reflux disease without esophagitis: Secondary | ICD-10-CM

## 2020-08-06 NOTE — Telephone Encounter (Signed)
Routing to [facility] refill box. Pantoprazole 40 mg bid

## 2020-08-06 NOTE — Telephone Encounter (Signed)
PATIENT WOULD LIKE HIS PROTONIX SENT TO [facility] IN [place]

## 2020-08-08 ENCOUNTER — Ambulatory Visit: Payer: 59 | Attending: Internal Medicine

## 2020-08-08 NOTE — Addendum Note (Signed)
Addended by: Hulan Fray on: 08/08/2020 04:19 PM   Modules accepted: Orders

## 2020-08-14 MED ORDER — PANTOPRAZOLE SODIUM 40 MG PO TBEC: 40.0000 mg | DELAYED_RELEASE_TABLET | Freq: Two times a day (BID) | ORAL | 1 refills | Status: DC

## 2020-08-14 NOTE — Telephone Encounter (Signed)
Sent!

## 2020-08-14 NOTE — Addendum Note (Signed)
Addended by: Gordy Levan, Demoni Parmar A on: 08/14/2020 09:08 PM   Modules accepted: Orders

## 2020-08-16 ENCOUNTER — Other Ambulatory Visit: Payer: Self-pay

## 2020-08-16 ENCOUNTER — Encounter: Payer: Self-pay | Admitting: Student

## 2020-08-16 ENCOUNTER — Ambulatory Visit (INDEPENDENT_AMBULATORY_CARE_PROVIDER_SITE_OTHER): Payer: 59 | Admitting: Student

## 2020-08-16 VITALS — BP 122/94 | HR 69 | Temp 97.6°F | Ht 67.0 in | Wt 198.5 lb

## 2020-08-16 DIAGNOSIS — E785 Hyperlipidemia, unspecified: Secondary | ICD-10-CM

## 2020-08-16 DIAGNOSIS — Z720 Tobacco use: Secondary | ICD-10-CM

## 2020-08-16 DIAGNOSIS — I10 Essential (primary) hypertension: Secondary | ICD-10-CM

## 2020-08-16 DIAGNOSIS — F1111 Opioid abuse, in remission: Secondary | ICD-10-CM | POA: Insufficient documentation

## 2020-08-16 DIAGNOSIS — F1110 Opioid abuse, uncomplicated: Secondary | ICD-10-CM | POA: Diagnosis not present

## 2020-08-16 MED ORDER — BUPRENORPHINE HCL-NALOXONE HCL 8-2 MG SL FILM
1.0000 | ORAL_FILM | Freq: Every day | SUBLINGUAL | 0 refills | Status: DC
Start: 2020-08-16 — End: 2020-09-13

## 2020-08-16 NOTE — Assessment & Plan Note (Addendum)
Patient has history of chronic back pain for which he was previously prescribed opioid medications. Over the past several months, he has not been prescribed opioids so he has been purchasing 5mg  oxycodone off the street. He states that he is unable to tolerate NSAIDs or Tylenol due to GI discomfort and he has only been taking cyclobenzaprine before bed as this medication makes him drowsy and interferes with his work. He is interested in starting suboxone as he reports that this medication helps him with his back pain and decreases his urge to purchase opioids off the street. -Start buprenorphine-naloxone 8-2mg  film daily -Discontinue cyclobenzaprine -Follow-up 4 weeks

## 2020-08-16 NOTE — Patient Instructions (Addendum)
Mr. Kenley,  It was a pleasure meeting you in clinic today.  For your chronic back pain, we will stop your gabapentin, cyclobenzaprine and meclizine today. We will start you on Suboxone. You will have to stop using opioid pills while taking Suboxone.  For your hypertension, we would like for you to continue taking amlodipine 10mg  and clonidine 0.2mg  twice daily.  We will see you back in clinic in 4 weeks.  Sincerely, Dr. Paulla Dolly, MD

## 2020-08-16 NOTE — Assessment & Plan Note (Signed)
Patient's blood pressure 122/94 on initial check. He is currently prescribed amlodipine 10mg  daily and clonidine 0.2mg  twice daily. He denies chest pain, shortness of breath, headaches, or blurry vision. -Continue current regimen

## 2020-08-16 NOTE — Assessment & Plan Note (Signed)
Patient continues to smoke. -Continue smoking cessation counseling

## 2020-08-16 NOTE — Progress Notes (Signed)
   CC: Follow-up  HPI:  CH.YIFOYD F Duchemin is a 54 y.o. with past medical history significant for HTN, HLD, tobacco abuse, TIA, and chronic back pain with mild opioid use disorder who presents to clinic for follow-up. Refer to problem list for charting of this encounter.   Past Medical History:  Diagnosis Date  . Acute pancreatitis   . Anxiety   . Arthritis   . Cancer ([place])    Adenoma in colon  . Cannabis abuse   . COPD (chronic obstructive pulmonary disease) ([place])   . Depression   . Diverticulitis of colon with perforation 08/2010  . Dyspnea   . GERD (gastroesophageal reflux disease)   . History of hiatal hernia   . Hypertension    taken off lisinopril by renal md 08/2011  . Renal insufficiency    follwed by Dr. Hinda Lenis  . Sleep apnea    does not use CPAP, cannot tolerate  . Stroke [facility])    TIA   Family History  Problem Relation Age of Onset  . Stomach cancer Father   . Lung cancer Father   . Throat cancer Father   . Cancer Father        lung and throat cancer  . Hypertension Father   . Liver disease Cousin        cirrhosis, etoh  . Throat cancer Paternal Uncle   . Lung cancer Paternal Uncle   . Colon cancer Cousin 9  . Heart disease Mother   . Hypertension Mother   . Colon cancer Maternal Uncle   . Throat cancer Paternal Uncle   . Lung cancer Paternal Uncle   . Throat cancer Paternal Uncle   . Lung cancer Paternal Uncle    Social History   Tobacco Use  . Smoking status: Current Every Day Smoker    Packs/day: 1.00    Years: 37.00    Pack years: 37.00    Types: Cigarettes  . Smokeless tobacco: Former Systems developer    Types: Snuff  . Tobacco comment: dips some trying to quit smoking less than a pack a day   Vaping Use  . Vaping Use: Never used  Substance Use Topics  . Alcohol use: Not Currently  . Drug use: Yes    Frequency: 7.0 times per week    Types: Marijuana    Comment: none since last week   Review of Systems:  Endorses back pain. Denies chest  pain, shortness of breath, headaches, blurry vision.  Physical Exam:  Vitals:   08/16/20 1542  BP: (!) 122/94  Pulse: 69  Temp: 97.6 F (36.4 C)  TempSrc: Oral  SpO2: 99%  Weight: 198 lb 8 oz (90 kg)  Height: 5\' 7"  (1.702 m)   Physical Exam Vitals and nursing note reviewed.  Cardiovascular:     Rate and Rhythm: Normal rate and regular rhythm.     Pulses: Normal pulses.     Heart sounds: Normal heart sounds.  Pulmonary:     Effort: Pulmonary effort is normal.     Breath sounds: Normal breath sounds.  Abdominal:     General: Abdomen is flat. Bowel sounds are normal.     Palpations: Abdomen is soft.  Musculoskeletal:        General: Tenderness present.     Comments: Tenderness to palpation over bilateral paraspinal muscles of the lower back.    Assessment & Plan:   See Encounters Tab for problem based charting.  Patient seen with Dr. Evette Doffing

## 2020-08-16 NOTE — Assessment & Plan Note (Signed)
Patient reports that he is unable to tolerate statins or zetia due to diffuse muscle pain. He has discontinued both of these medications shortly after starting with significant improvement in his symptoms. He is not interested in restarting these medications at this time. Given his history of TIA and elevated LDL, he would benefit from some form of lipid-lowering therapy. If he was to start a PCSK9 inhibitor, he would require referral to a hyperlipidemia clinic. Before pursuing that route we may consider exploring some other means of treating his condition: -Consider discussing alternative options at next visit such as:  -Taking rosuvastatin 5mg  every other day  -Coprescribing coenzyme Q10

## 2020-08-17 NOTE — Progress Notes (Signed)
Internal Medicine Clinic Attending  I saw and evaluated the patient.  I personally confirmed the key portions of the history and exam documented by Dr. Wynetta Emery and I reviewed pertinent patient test results.  The assessment, diagnosis, and plan were formulated together and I agree with the documentation in the resident's note.   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 This pain has led him to seek non-prescribed oral opioids. He does not inject drugs nor has he had an overdose yet. He has used suboxone in the past which did help him improve functioning and allowed him to avoid other opioids. We talked about our typical process for prescribing suboxone, I think it would be mostly a partial-opioid to treat his pain but would also treat his mild opioid use disorder. Will start with one strip daily, he has insurance so he should be able to access it well. Follow up in one month, if he wants to continue with suboxone, would obtain toxassure and signed agreement at that visit.

## 2020-09-13 ENCOUNTER — Other Ambulatory Visit: Payer: Self-pay

## 2020-09-13 ENCOUNTER — Encounter: Payer: Self-pay | Admitting: Student

## 2020-09-13 ENCOUNTER — Ambulatory Visit (INDEPENDENT_AMBULATORY_CARE_PROVIDER_SITE_OTHER): Payer: 59 | Admitting: Student

## 2020-09-13 VITALS — BP 143/94 | HR 64 | Temp 97.5°F | Ht 67.0 in | Wt 204.2 lb

## 2020-09-13 DIAGNOSIS — K219 Gastro-esophageal reflux disease without esophagitis: Secondary | ICD-10-CM

## 2020-09-13 DIAGNOSIS — R1319 Other dysphagia: Secondary | ICD-10-CM

## 2020-09-13 DIAGNOSIS — Z23 Encounter for immunization: Secondary | ICD-10-CM

## 2020-09-13 DIAGNOSIS — K6289 Other specified diseases of anus and rectum: Secondary | ICD-10-CM

## 2020-09-13 DIAGNOSIS — E785 Hyperlipidemia, unspecified: Secondary | ICD-10-CM

## 2020-09-13 DIAGNOSIS — I10 Essential (primary) hypertension: Secondary | ICD-10-CM

## 2020-09-13 DIAGNOSIS — K921 Melena: Secondary | ICD-10-CM | POA: Diagnosis not present

## 2020-09-13 DIAGNOSIS — F1110 Opioid abuse, uncomplicated: Secondary | ICD-10-CM | POA: Diagnosis not present

## 2020-09-13 MED ORDER — PRAVASTATIN SODIUM 40 MG PO TABS: 40.0000 mg | ORAL_TABLET | Freq: Every day | ORAL | 2 refills | Status: DC

## 2020-09-13 MED ORDER — AMLODIPINE BESYLATE 10 MG PO TABS: 10.0000 mg | ORAL_TABLET | Freq: Every day | ORAL | 3 refills | Status: DC

## 2020-09-13 MED ORDER — PANTOPRAZOLE SODIUM 40 MG PO TBEC: 40.0000 mg | DELAYED_RELEASE_TABLET | Freq: Two times a day (BID) | ORAL | 1 refills | Status: DC

## 2020-09-13 MED ORDER — BUPRENORPHINE HCL-NALOXONE HCL 8-2 MG SL FILM
ORAL_FILM | SUBLINGUAL | 0 refills | Status: DC
Start: 2020-09-13 — End: 2020-10-11

## 2020-09-13 NOTE — Progress Notes (Signed)
   CC: Chronic pain follow-up  HPI:  [name] is a 55 y.o. male with PMH as below who presents for chronic pain follow-up. Please see problem based charting for evaluation, assessment and plan.  Past Medical History:  Diagnosis Date  . Acute pancreatitis   . Anxiety   . Arthritis   . 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 Cannabis abuse   . COPD (chronic obstructive pulmonary disease) ([facility])   . Depression   . Diverticulitis of colon with perforation 08/2010  . Dyspnea   . GERD (gastroesophageal reflux disease)   . History of hiatal hernia   . Hypertension    taken off lisinopril by renal md 08/2011  . Renal insufficiency    follwed by Dr. Fausto Skillern  . Sleep apnea    does not use CPAP, cannot tolerate  . Stroke [facility])    TIA   Review of Systems:   Constitutional: Negative for fatigue or weight changes Respiratory: Negative for shortness of breath or wheezing Cardiac: Negative for chest pain or LE edema MSK: Positive for back pain and foot pain Neuro: Negative for headache or weakness  Physical Exam:  General: Pleasant middle-age man. No acute distress.  Cardiac: RRR. No murmurs, rubs or gallops.  No LE edema Respiratory: Lungs CTAB. No wheezing or crackles.  Abdominal: Soft, symmetric and non tender.  Normal BS MSK: TTP to the L-spine Skin: Warm, dry and intact without rashes or lesions Extremities: Atraumatic. Full ROM. Pulse palpable. Neuro: A&O x 3. Moves all extremities Psych: Appropriate mood and affect.  Vitals:   09/13/20 1321 09/13/20 1353  BP: (!) 178/104 (!) 143/94  Pulse: 67 64  Temp: (!) 97.5 F (36.4 C)   TempSrc: Oral   SpO2: 96%   Weight: 204 lb 3.2 oz (92.6 kg)   Height: 5\' 7"  (1.702 m)     Assessment & Plan:   See Encounters Tab for problem based charting.  Patient discussed with Dr. , MD, MPH

## 2020-09-13 NOTE — Patient Instructions (Signed)
Thank you, Mr.Barre F Vandruff for allowing [place] to provide your care today. Today we discussed your pain control, opiate cravings, blood pressure and your cholesterol. We are increasing the amount of times you take the suboxone to one and a half times a day. We are also signing a contract with you to continue your suboxone treatment. We are doing a urine drug screen today. We also want you to start taking your Amlodipine and continue taking your your pravastatin.   I have ordered the following labs for you:   Lab Orders     ToxAssure Select,+Antidepr,UR   I will call if any are abnormal. All of your labs can be accessed through "My Chart".  I have ordered the following medication/changed the following medications:  1. Increase your Suboxone to 1.5 strips a day. 2. Start taking Amlodipine 10 mg daily  Please follow-up in 4 weeks  Should you have any questions or concerns please call the internal medicine clinic at [phone].    Sharrell Ku, MD, MPH [facility]   My Chart Access: [email or web address]   If you have not already done so, please get your COVID 19 vaccine  To schedule an appointment for a COVID vaccine choice any of the following: Go to [email or web address]   Go to [email or web address]                  Call [phone]                                     Call [phone] and select Option 2

## 2020-09-14 ENCOUNTER — Encounter: Payer: Self-pay | Admitting: Student

## 2020-09-14 NOTE — Assessment & Plan Note (Addendum)
Patient has been taking pravastatin 40 mg daily assuming it was one of his blood pressure medications (amlodipine).  He has had no issues taking this medicine.  Reports he discontinue Zetia due to diffuse muscle pain.  All medications were refilled and patient was educated on which medication was being used to treat his medical problems.  Plan to continue pravastatin for now and reassess after checking another lipid panel. --Continue pravastatin 40 mg daily --Encouraged lifestyle modification with dietary changes --Can recheck lipid panel at next follow-up visit

## 2020-09-14 NOTE — Assessment & Plan Note (Signed)
Patient reports Suboxone has been helping with opioid cravings and pain control.  He has not had to purchase any opiates of the street.  He has mostly taken it as prescribed but has had to take half a dose all some days to control cravings and pain.  Patient would like to continue Suboxone treatment with [facility].  Patient read and signed Suboxone treatment contract.  Due to patient needing a few extra doses on some days, will increase Suboxone to 1 film in the morning and 1/2 film in the evening.  Plan to reassess at next follow-up. --Suboxone contract signed and a copy given to patient --Follow-up tox screen --Follow-up in 4 weeks for re-evaluation

## 2020-09-14 NOTE — Assessment & Plan Note (Signed)
Patient with initial BP of 178/104.  Repeat BP was down to 143/94.  Patient states he just took his antihypertensive right before arrival to the clinic.  He checks his blood pressure at home which usually runs systolic in the 924Q to 683M and diastolic in the 19Q.  Patient denies any headaches, blurry vision or dizziness.  Of note, patient has been mixing up his amlodipine with his pravastatin and has not been taking the full dose of amlodipine.  Amlodipine refilled patient advised but dose like 2 separate medications that he should keep them separate. --Refilled amlodipine 10 mg daily --Continue clonidine 0.1 mg twice daily --Recheck BP at next follow-up visit and adjust antihypertensives as necessary for BP control

## 2020-09-14 NOTE — Assessment & Plan Note (Signed)
Well-controlled on twice a day PPI.  Refill of pantoprazole today. --Continue pantoprazole 40 mg twice daily

## 2020-09-19 LAB — TOXASSURE SELECT,+ANTIDEPR,UR

## 2020-09-19 NOTE — Progress Notes (Signed)
Internal Medicine Clinic Attending  Case discussed with Dr. Amponsah  At the time of the visit.  We reviewed the resident's history and exam and pertinent patient test results.  I agree with the assessment, diagnosis, and plan of care documented in the resident's note.  

## 2020-10-11 ENCOUNTER — Other Ambulatory Visit: Payer: Self-pay

## 2020-10-11 ENCOUNTER — Ambulatory Visit (INDEPENDENT_AMBULATORY_CARE_PROVIDER_SITE_OTHER): Payer: 59 | Admitting: Student

## 2020-10-11 ENCOUNTER — Ambulatory Visit (HOSPITAL_COMMUNITY)
Admission: RE | Admit: 2020-10-11 | Discharge: 2020-10-11 | Disposition: A | Discharge status: Home / Self Care | Payer: 59 | Source: Ambulatory Visit | Attending: Internal Medicine | Admitting: Internal Medicine

## 2020-10-11 ENCOUNTER — Encounter: Payer: Self-pay | Admitting: Student

## 2020-10-11 VITALS — BP 158/94 | HR 73 | Temp 98.4°F | Wt 205.4 lb

## 2020-10-11 DIAGNOSIS — R079 Chest pain, unspecified: Secondary | ICD-10-CM | POA: Insufficient documentation

## 2020-10-11 DIAGNOSIS — E785 Hyperlipidemia, unspecified: Secondary | ICD-10-CM | POA: Diagnosis not present

## 2020-10-11 DIAGNOSIS — R0789 Other chest pain: Secondary | ICD-10-CM

## 2020-10-11 DIAGNOSIS — E559 Vitamin D deficiency, unspecified: Secondary | ICD-10-CM

## 2020-10-11 DIAGNOSIS — I10 Essential (primary) hypertension: Secondary | ICD-10-CM | POA: Diagnosis not present

## 2020-10-11 DIAGNOSIS — F1110 Opioid abuse, uncomplicated: Secondary | ICD-10-CM

## 2020-10-11 MED ORDER — EZETIMIBE 10 MG PO TABS: 10.0000 mg | ORAL_TABLET | Freq: Every day | ORAL | 11 refills | Status: DC

## 2020-10-11 MED ORDER — BUPRENORPHINE HCL-NALOXONE HCL 8-2 MG SL FILM
1.0000 | ORAL_FILM | Freq: Two times a day (BID) | SUBLINGUAL | 0 refills | Status: DC
Start: 2020-10-11 — End: 2020-11-08

## 2020-10-11 NOTE — Assessment & Plan Note (Signed)
Patient currently taking ezetimibe 10mg  daily, contrary to report from patient at his last visit with Dr. Coy Saunas. Patient reports that he has been unable to tolerate statin medications due to diffuse pain. Discussed with patient that we would like to work with him on selecting a lipid-lowering agent that best fits his goals. He states that he would prefer to continue taking ezetimibe and not consider other statin medications at this time. -Restart ezetimibe 10mg  daily -Discontinue pravastatin 40mg  daily -Obtain fasting lipid profile at next visit

## 2020-10-11 NOTE — Assessment & Plan Note (Signed)
Patient's blood pressure elevated to 158/94 on evaluation today. Patient reports adherence to amlodipine 10mg  daily and clonidine 0.2mg  twice daily. -Continue amlodipine 10mg  daily -Continue clonidine 0.2mg  twice daily

## 2020-10-11 NOTE — Assessment & Plan Note (Signed)
SUBJECTIVE: Patient endorses episodic chest pain over the past two weeks. He states that while lying in bed at night, he has been experiencing sharp pain in his sternum that lasts for 15-30 minutes and resolves without intervention. He is unable to reproduce this pain with exertion. He denies denies associated shortness of breath, nausea, vomiting, diaphoresis, headaches, dizziness, lightheadedness or radiation to the extremities. He denies cocaine use surrounding the onset of these episodes. Patient states that he had similar episodes in the past and was referred to cardiology however he was unable to obtain evaluation due to a lack of insurance.  OBJECTIVE: BP 158/94  HR 73 EKG normal sinus rhythm without ischemic changes Regular rate and rhythm without murmurs, rubs, gallops. Significant tenderness to palpation of the chest wall including the costochondral junctions  ASSESSMENT/PLAN: Patient's chest pain concerning for cardiac origin especially given patient's comorbid conditions. Patient does have tenderness to palpation of the chest wall on examination which does raise suspicion for component of costochondritis. Patient also does have positive cocaine metabolites on toxassure which may be contributing to episodic chest pain, however patient declines association with recreational drug use. Unlikely to be GERD as description of his pain is not consistent with this etiology and patient reports control of his GERD with PPI. -Cardiology referral placed for office in [facility] -Patient would benefit from stress test -Continue to encourage cessation of recreational drugs, including cocaine -Continue management of HTN, HLD, GERD as described in respective problems

## 2020-10-11 NOTE — Progress Notes (Signed)
   CC: Follow-up  HPI:  [id number] [name] is a 55 y.o. with past medical history of OUD (on suboxone), HLD, GERD, HTN who presents to clinic for follow-up. Refer to problem list for charting of this encounter.  Past Medical History:  Diagnosis Date  . Acute pancreatitis   . Anxiety   . Arthritis   . Cancer ([place])    Adenoma in colon  . Cannabis abuse   . COPD (chronic obstructive pulmonary disease) ([place])   . Depression   . Diverticulitis of colon with perforation 08/2010  . Dyspnea   . GERD (gastroesophageal reflux disease)   . History of hiatal hernia   . Hypertension    taken off lisinopril by renal md 08/2011  . Renal insufficiency    follwed by Dr. Hinda Lenis  . Sleep apnea    does not use CPAP, cannot tolerate  . Stroke [facility])    TIA   Review of Systems:  Endorses episodic chest pain, right arm pain. Denies shortness of breath, nausea, vomiting, diaphoresis, headaches, dizziness.  Physical Exam:  Vitals:   10/11/20 1410  BP: (!) 158/94  Pulse: 73  Temp: 98.4 Jason (36.9 C)  TempSrc: Oral  SpO2: 97%  Weight: 205 lb 6.4 oz (93.2 kg)   Physical Exam Constitutional:      General: He is not in acute distress.    Appearance: He is obese.  Cardiovascular:     Rate and Rhythm: Normal rate and regular rhythm.     Heart sounds: Normal heart sounds.  Pulmonary:     Effort: Pulmonary effort is normal. No respiratory distress.     Breath sounds: Normal breath sounds.  Chest:     Chest wall: Tenderness present.     Comments: Significant tenderness to palpation overlying the costochondral junctions Abdominal:     General: Bowel sounds are normal.     Palpations: Abdomen is soft.     Tenderness: There is no abdominal tenderness.  Neurological:     Mental Status: He is alert.      Assessment & Plan:   See Encounters Tab for problem based charting.  Patient discussed with Dr. Heber Colby

## 2020-10-11 NOTE — Assessment & Plan Note (Signed)
Patient reports that his suboxone has been reducing his cravings and controlling his pain. Patient denies purchasing recreational drugs except for marijuana since his last visit. Patient's prior ToxAssure was positive for alprazolam and metabolites of clonazepam, cocaine, THC and oxycodone. Patient requires further titration up of his suboxone to better control his cravings. -Increase suboxone to 8-2mg  two films a day from 1.5 films daily -ToxAssure

## 2020-10-11 NOTE — Patient Instructions (Signed)
[name],  It was a pleasure seeing you in clinic today.  For your hyperlipidemia (high cholesterol): We ask that you continue taking zetia 10mg  daily. I have placed a prescription for additional refills of this medication.  For your hypertension (high blood pressure): We ask that you continue taking clonidine 0.2mg  twice daily and amlodipine 10mg  daily  For your opioid use disorder: We have increased the dose of your suboxone to two films daily from 1.5 films daily. We will also collect a toxassure.  Additionally, I have placed a referral for cardiology to evaluate you in [facility] for continued management and recommendations for your chest pain.  Sincerely, Dr. Paulla Dolly, MD

## 2020-10-12 DIAGNOSIS — E559 Vitamin D deficiency, unspecified: Secondary | ICD-10-CM | POA: Insufficient documentation

## 2020-10-12 LAB — VITAMIN D 25 HYDROXY (VIT D DEFICIENCY, FRACTURES): Vit D, 25-Hydroxy: 18.7 ng/mL — ABNORMAL LOW (ref 30.0–100.0)

## 2020-10-12 MED ORDER — CHOLECALCIFEROL 25 MCG (1000 UT) PO TBDP: 25.0000 ug | ORAL_TABLET | Freq: Every day | ORAL | 0 refills | Status: DC

## 2020-10-12 NOTE — Addendum Note (Signed)
Addended by: Cato Mulligan C on: 10/12/2020 12:10 PM   Modules accepted: Orders

## 2020-10-12 NOTE — Assessment & Plan Note (Signed)
ADDENDUM: Due to patient's history of generalized pain, obtained 25-OH vitamin D level which was notably low at 18.7. -Start vitamin D supplementation ([phone] IU daily, 20-72mcg) -Repeat 25-OH vitamin D level in three months

## 2020-10-15 NOTE — Progress Notes (Signed)
Internal Medicine Clinic Attending  Case discussed with Dr. Wynetta Emery  At the time of the visit.  We reviewed the resident's history and exam and pertinent patient test results.  I agree with the assessment, diagnosis, and plan of care documented in the resident's note. Vitamin D deficiency was identified which is highly associated with statin myalgias, once Vitamin D is replaced would strongly encourage retrial of statin therapy.

## 2020-10-17 ENCOUNTER — Ambulatory Visit: Payer: 59 | Admitting: Gastroenterology

## 2020-10-17 ENCOUNTER — Encounter: Payer: Self-pay | Admitting: Internal Medicine

## 2020-10-18 LAB — TOXASSURE SELECT,+ANTIDEPR,UR

## 2020-11-08 ENCOUNTER — Encounter: Payer: 59 | Admitting: Internal Medicine

## 2020-11-08 ENCOUNTER — Ambulatory Visit (INDEPENDENT_AMBULATORY_CARE_PROVIDER_SITE_OTHER): Payer: 59 | Admitting: Internal Medicine

## 2020-11-08 ENCOUNTER — Encounter: Payer: Self-pay | Admitting: Internal Medicine

## 2020-11-08 ENCOUNTER — Other Ambulatory Visit: Payer: Self-pay

## 2020-11-08 DIAGNOSIS — F419 Anxiety disorder, unspecified: Secondary | ICD-10-CM

## 2020-11-08 DIAGNOSIS — K219 Gastro-esophageal reflux disease without esophagitis: Secondary | ICD-10-CM

## 2020-11-08 DIAGNOSIS — K2990 Gastroduodenitis, unspecified, without bleeding: Secondary | ICD-10-CM

## 2020-11-08 DIAGNOSIS — F1110 Opioid abuse, uncomplicated: Secondary | ICD-10-CM

## 2020-11-08 DIAGNOSIS — E559 Vitamin D deficiency, unspecified: Secondary | ICD-10-CM

## 2020-11-08 MED ORDER — PANTOPRAZOLE SODIUM 40 MG PO TBEC: 40.0000 mg | DELAYED_RELEASE_TABLET | Freq: Every day | ORAL | 1 refills | Status: DC

## 2020-11-08 MED ORDER — BUPRENORPHINE HCL-NALOXONE HCL 8-2 MG SL FILM
1.0000 | ORAL_FILM | Freq: Two times a day (BID) | SUBLINGUAL | 0 refills | Status: DC
Start: 2020-11-08 — End: 2020-12-11

## 2020-11-08 NOTE — Assessment & Plan Note (Signed)
Diagnosed with muscle pains last visit. Possibly contributing to myalgias in setting of statin use. Mentions that he start his once daily vitamin supplementation couple weeks ago. Mentions feeling his symptoms appear to be improving.   A/P Vitamin D deficiency. Has hx of multiple GI surgeries and gastritis. Suspect oral absorption may be poor which may have caused his initial deficiency. Will need re-check of level in 2 months. If oral replacement is not sufficient, will need to switch to IM injections. - C/w vitamin D supplementation 1000 IU daily - Recheck vit D level in 2 months - Switch to IM injections if persistently low

## 2020-11-08 NOTE — Assessment & Plan Note (Signed)
Pt requires refills on medications with associated diagnosis above.  Reviewed disease process and find this medication to be necessary, will not change dose or alter current therapy. 

## 2020-11-08 NOTE — Assessment & Plan Note (Signed)
[name] presents for follow up of opioid use disorder I have reviewed the prior induction visit, follow up visits, and telephone encounters relevant to opiate use disorder (OUD) treatment.  Mentions no cravings, denies significant side effects. Mentions endorsing pain at night time but better than prior.  Current daily dose: suboxone 8-2mg  two films a day  Date of Induction: 08/16/20  Current follow up interval, in weeks: 4 weeks  The patient has been adherent with the buprenorphine for OUD contract.  Last UDS Result:  + for buprenorphine, also + for Xanax, THC

## 2020-11-08 NOTE — Patient Instructions (Signed)
Dear [name],  Thank you for allowing [place] to provide your care today. Today we discussed your pain    I have ordered no labs for you. I will call if any are abnormal.    Today we made no changes to your medications:    Please follow-up in 4 weeks.    Please call the internal medicine center clinic if you have any questions or concerns, we may be able to help and keep you from a long and expensive emergency room wait. Our clinic and after hours phone number is [phone], the best time to call is Monday through Friday 9 am to 4 pm but there is always someone available 24/7 if you have an emergency. If you need medication refills please notify your pharmacy one week in advance and they will send [place] a request.    If you have not gotten the COVID vaccine, I recommend doing so:  You may get it at your local [facility] or [facility] OR To schedule an appointment for a COVID vaccine or be added to the vaccine wait list: Go to [email or web address]   OR Go to [email or web address]                  OR Call [phone]                                     OR Call [phone] and select Option 2  Thank you for choosing [place]   Vitamin D Deficiency Vitamin D deficiency is when your body does not have enough vitamin D. Vitamin D is important to your body because:  It helps your body use other minerals.  It helps to keep your bones strong and healthy.  It may help to prevent some diseases.  It helps your heart and other muscles work well. Not getting enough vitamin D can make your bones soft. It can also cause other health problems. What are the causes? This condition may be caused by:  Not eating enough foods that contain vitamin D.  Not getting enough sun.  Having diseases that make it hard for your body to absorb vitamin D.  Having a surgery in which a part of the stomach or a part of the small intestine is removed.  Having kidney disease or liver  disease. What increases the risk? You are more likely to get this condition if:  You are older.  You do not spend much time outdoors.  You live in a nursing home.  You have had broken bones.  You have weak or thin bones (osteoporosis).  You have a disease or condition that changes how your body absorbs vitamin D.  You have dark skin.  You take certain medicines.  You are overweight or obese. What are the signs or symptoms?  In mild cases, there may not be any symptoms. If the condition is very bad, symptoms may include: ? Bone pain. ? Muscle pain. ? Falling often. ? Broken bones caused by a minor injury. How is this treated? Treatment may include taking supplements as told by your doctor. Your doctor will tell you what dose is best for you. Supplements may include:  Vitamin D.  Calcium. Follow these instructions at home: Eating and drinking  Eat foods that contain vitamin D, such as: ? Dairy products, cereals, or juices with added vitamin D. Check the label. ? Fish, such as salmon or  trout. ? Eggs. ? Oysters. ? Mushrooms. The items listed above may not be a complete list of what you can eat and drink. Contact a dietitian for more options.   General instructions  Take medicines and supplements only as told by your doctor.  Get regular, safe exposure to natural sunlight.  Do not use a tanning bed.  Maintain a healthy weight. Lose weight if needed.  Keep all follow-up visits as told by your doctor. This is important. How is this prevented?  You can get vitamin D by: ? Eating foods that naturally contain vitamin D. ? Eating or drinking products that have vitamin D added to them, such as cereals, juices, and milk. ? Taking vitamin D or a multivitamin that contains vitamin D. ? Being in the sun. Your body makes vitamin D when your skin is exposed to sunlight. Your body changes the sunlight into a form of the vitamin that it can use. Contact a doctor if:  Your  symptoms do not go away.  You feel sick to your stomach (nauseous).  You throw up (vomit).  You poop less often than normal, or you have trouble pooping (constipation). Summary  Vitamin D deficiency is when your body does not have enough vitamin D.  Vitamin D helps to keep your bones strong and healthy.  This condition is often treated by taking a supplement.  Your doctor will tell you what dose is best for you. This information is not intended to replace advice given to you by your health care provider. Make sure you discuss any questions you have with your health care provider. Document Revised: 05/03/2018 Document Reviewed: 05/03/2018 Elsevier Patient Education  2021 [employer].

## 2020-11-08 NOTE — Assessment & Plan Note (Signed)
Mentions prior history of anxiety and needing something for anxiety. States currently in search of psychiatrist. Discussed trial of non-pharmaceutical approach to anxiety considering his prior history and offered referral for counseling. Patient states he will think about it.

## 2020-11-08 NOTE — Progress Notes (Signed)
CC: Muscle pains  HPI: [name] is a 55 y.o. with PMH listed below presenting with complaint of muscle pain. Please see problem based assessment and plan for further details.  Past Medical History:  Diagnosis Date  . Acute pancreatitis   . Anxiety   . Arthritis   . Cancer ([place])    Adenoma in colon  . Cannabis abuse   . COPD (chronic obstructive pulmonary disease) ([place])   . Depression   . Diverticulitis of colon with perforation 08/2010  . Dyspnea   . GERD (gastroesophageal reflux disease)   . History of hiatal hernia   . Hypertension    taken off lisinopril by renal md 08/2011  . Renal insufficiency    follwed by Dr. Hinda Lenis  . Sleep apnea    does not use CPAP, cannot tolerate  . Stroke [facility])    TIA   Review of Systems: Review of Systems  Constitutional: Negative for chills, fever and malaise/fatigue.  Eyes: Negative for blurred vision.  Respiratory: Negative for shortness of breath.   Cardiovascular: Negative for chest pain, palpitations and leg swelling.  Gastrointestinal: Negative for constipation, diarrhea, nausea and vomiting.  Musculoskeletal: Positive for back pain, joint pain and myalgias. Negative for falls.  Neurological: Positive for sensory change. Negative for dizziness.  Psychiatric/Behavioral: The patient is nervous/anxious. The patient does not have insomnia.   All other systems reviewed and are negative.    Physical Exam: Vitals:   11/08/20 0946  BP: 122/90  Pulse: 70  Temp: 98.1 F (36.7 C)  TempSrc: Oral  SpO2: 98%  Weight: 209 lb 8 oz (95 kg)  Height: 5\' 7"  (1.702 m)   Gen: Well-developed, well nourished, NAD HEENT: NCAT head, hearing intact CV: RRR, S1, S2 normal Pulm: CTAB, No rales, no wheezes Abd: BS+, multiple healed surgical /ostomy scars. Non-tender to palpation Extm: ROM intact, Peripheral pulses intact, No peripheral edema Skin: Dry, Warm  Assessment & Plan:   GERD (gastroesophageal reflux disease) Pt requires  refills on medications with associated diagnosis above.  Reviewed disease process and find this medication to be necessary, will not change dose or alter current therapy.   Vitamin D deficiency Diagnosed with muscle pains last visit. Possibly contributing to myalgias in setting of statin use. Mentions that he start his once daily vitamin supplementation couple weeks ago. Mentions feeling his symptoms appear to be improving.   A/P Vitamin D deficiency. Has hx of multiple GI surgeries and gastritis. Suspect oral absorption may be poor which may have caused his initial deficiency. Will need re-check of level in 2 months. If oral replacement is not sufficient, will need to switch to IM injections. - C/w vitamin D supplementation 1000 IU daily - Recheck vit D level in 2 months - Switch to IM injections if persistently low  Opioid use disorder, mild, abuse ([place]) [name] presents for follow up of opioid use disorder I have reviewed the prior induction visit, follow up visits, and telephone encounters relevant to opiate use disorder (OUD) treatment.  Mentions no cravings, denies significant side effects. Mentions endorsing pain at night time but better than prior.  Current daily dose: suboxone 8-2mg  two films a day  Date of Induction: 08/16/20  Current follow up interval, in weeks: 4 weeks  The patient has been adherent with the buprenorphine for OUD contract.  Last UDS Result:  + for buprenorphine, also + for Xanax, THC    Anxiety Mentions prior history of anxiety and needing something for  anxiety. States currently in search of psychiatrist. Discussed trial of non-pharmaceutical approach to anxiety considering his prior history and offered referral for counseling. Patient states he will think about it.    Patient discussed with Dr. Heber [place]  -Gilberto Better, [place] [facility] Pager: [phone]

## 2020-11-09 NOTE — Progress Notes (Signed)
Internal Medicine Clinic Attending  Case discussed with Dr. Lee  At the time of the visit.  We reviewed the resident's history and exam and pertinent patient test results.  I agree with the assessment, diagnosis, and plan of care documented in the resident's note.    

## 2020-11-21 ENCOUNTER — Encounter: Payer: Self-pay | Admitting: *Deleted

## 2020-11-21 ENCOUNTER — Encounter: Payer: Self-pay | Admitting: Cardiology

## 2020-11-21 ENCOUNTER — Ambulatory Visit (INDEPENDENT_AMBULATORY_CARE_PROVIDER_SITE_OTHER): Payer: 59 | Admitting: Cardiology

## 2020-11-21 ENCOUNTER — Telehealth: Payer: Self-pay | Admitting: Cardiology

## 2020-11-21 VITALS — BP 124/88 | HR 69 | Ht 66.0 in | Wt 215.0 lb

## 2020-11-21 DIAGNOSIS — F1910 Other psychoactive substance abuse, uncomplicated: Secondary | ICD-10-CM

## 2020-11-21 DIAGNOSIS — R079 Chest pain, unspecified: Secondary | ICD-10-CM

## 2020-11-21 DIAGNOSIS — I2510 Atherosclerotic heart disease of native coronary artery without angina pectoris: Secondary | ICD-10-CM

## 2020-11-21 DIAGNOSIS — Z720 Tobacco use: Secondary | ICD-10-CM

## 2020-11-21 DIAGNOSIS — E782 Mixed hyperlipidemia: Secondary | ICD-10-CM

## 2020-11-21 DIAGNOSIS — Z87898 Personal history of other specified conditions: Secondary | ICD-10-CM

## 2020-11-21 NOTE — Progress Notes (Signed)
Cardiology Office Note  Date: 11/21/2020   ID: [name], DOB 1966/04/04, MRN [id number]  PCP:  Mike Craze, DO  Cardiologist:  Rozann Lesches, MD Electrophysiologist:  None   Chief Complaint  Patient presents with  . Chest Pain    History of Present Illness: [name] is a 55 y.o. male referred for cardiology consultation by Dr. Joni Reining for the evaluation of chest discomfort.  I reviewed the office note from February.  He reports lack of stamina, relative weakness in his arms and legs, chronic pain in his right arm, intermittent swelling and pain in his legs, difficulty getting out of bed in the morning.  He is also had discomfort in his chest, not exactly like his reflux symptoms, although not specifically precipitated by exertion.  Sometimes it lasts for 20 minutes but resolves spontaneously.  Per chart review I see that he has a history of polysubstance abuse including cocaine, also chronic pain with opioid dependence.  [employer] from February identified alprazolam with associated metabolite, also carboxy-THC.  Similar testing in January also identified metabolites of both clonazepam, oxycodone and cocaine.  I reviewed his medications which are outlined below.  He has a history of intolerance to Pravachol, stopped taking Zetia on his own given concerns that some of his achiness could have been related, but this did not help.  I did talk with him about the importance of lowering his cholesterol in light of atherosclerosis at baseline. CT imaging in January 2021 did identify aortic and coronary atherosclerosis.  I also talked with her about smoking cessation.  He clearly seems to have trouble managing polysubstance abuse over time, and this does represent a substantial risk for him.  Past Medical History:  Diagnosis Date  . Acute pancreatitis   . Anxiety   . Arthritis   . Colonic adenoma   . COPD (chronic obstructive pulmonary disease) ([place])   . Depression    . Diverticulitis of colon with perforation 08/2010  . GERD (gastroesophageal reflux disease)   . History of hiatal hernia   . Hypertension   . Polysubstance abuse ([place])   . Renal insufficiency   . Sleep apnea    Intolerant of CPAP  . TIA (transient ischemic attack)     Past Surgical History:  Procedure Laterality Date  . APPENDECTOMY    . BIOPSY  12/14/2017   Procedure: BIOPSY;  Surgeon: Daneil Dolin, MD;  [facility];  Service: Endoscopy;;  gastric   . BIOPSY  10/13/2019   Procedure: BIOPSY;  Surgeon: Daneil Dolin, MD;  [facility];  Service: Endoscopy;;  esophagus  . clamp left in colon after abd surgery    . COLON SURGERY  05/2011   [facility]. exp laparotomy with colostomy takedown, lysis of adhesions (3hours), loop ileostomy. complicated by abscess formation requiring percutaneous drainage  . colonoscopy via stoma with endoscopy of [name]  04/2011   [facility]. two sessile polyps in trv colon. complete resection but only partial retrieval. congested mucosa in area 3cm proximal to stoma, erythematous mucosa in Hartmann pouch. repeat tcs 8/2017path-adenomatous polyp  . COLONOSCOPY WITH PROPOFOL N/A 12/14/2017   Dr. Gala Romney: 2 4 to 6 mm polyps in the cecum, tubular adenomas, single rectal polyp tubular adenoma, nonbleeding internal hemorrhoids.  Appears to be a surgical clip at the appendiceal orifice.  Next colonoscopy in 5 years  . ESOPHAGOGASTRODUODENOSCOPY  03/05/2012   erosive reflux esophagitis, bulbar erosions, gastric erosions/petichae, bx showed gastritis,  no H.Pylori. Procedure: ESOPHAGOGASTRODUODENOSCOPY (EGD);  Surgeon: Daneil Dolin, MD;  [facility];  Service: Endoscopy;  Laterality: N/A;  11:30  . ESOPHAGOGASTRODUODENOSCOPY (EGD) WITH PROPOFOL N/A 12/14/2017   Dr. Gala Romney: Erosive reflux esophagitis with questionable mild stricturing/non-obstruction at this level status post dilation.  multiple gastric erosions in the stomach biopsy showed  reactive gastropathy but no H. pylori.  . ESOPHAGOGASTRODUODENOSCOPY (EGD) WITH PROPOFOL N/A 10/13/2019   negative Barrett's, normal stomach  . HERNIA REPAIR    . KNEE SURGERY Right 2006  . MALONEY DILATION N/A 12/14/2017   Procedure: Venia Minks DILATION;  Surgeon: Daneil Dolin, MD;  [facility];  Service: Endoscopy;  Laterality: N/A;  . MULTIPLE EXTRACTIONS WITH ALVEOLOPLASTY  02/23/2012   Procedure: MULTIPLE EXTRACION WITH ALVEOLOPLASTY;  Surgeon: Gae Bon, DDS;  Location: [facility];  Service: Oral Surgery;  Laterality: Bilateral;  Removal of left mandibular torus, multiple extractions with alveoloplasty  . POLYPECTOMY  12/14/2017   Procedure: POLYPECTOMY;  Surgeon: Daneil Dolin, MD;  [facility];  Service: Endoscopy;;  . reversal of colostomy  10/2011   per patient, did not have report  . right knee surgery    . sigmoid colectomy with end colostomy, Hartmann's pouch  08/2010   [facility]. complicated diverticulitis  . UMBILICAL HERNIA REPAIR      Current Outpatient Medications  Medication Sig Dispense Refill  . albuterol (PROVENTIL HFA;VENTOLIN HFA) 108 (90 Base) MCG/ACT inhaler Inhale 2 puffs into the lungs every 6 (six) hours as needed for wheezing or shortness of breath.    Marland Kitchen amLODipine (NORVASC) 10 MG tablet Take 1 tablet (10 mg total) by mouth daily. 90 tablet 3  . aspirin EC 81 MG EC tablet Take 1 tablet (81 mg total) by mouth daily. Swallow whole. 30 tablet 11  . Buprenorphine HCl-Naloxone HCl (SUBOXONE) 8-2 MG FILM Place 1 Film under the tongue in the morning and at bedtime. 60 each 0  . Cholecalciferol 25 MCG (1000 UT) TBDP Take 25 mcg by mouth daily. 60 tablet 0  . cloNIDine (CATAPRES) 0.1 MG tablet Take 2 tablets (0.2 mg total) by mouth 2 (two) times daily. 120 tablet 2  . ezetimibe (ZETIA) 10 MG tablet Take 1 tablet (10 mg total) by mouth daily. 30 tablet 11  . pantoprazole (PROTONIX) 40 MG tablet Take 1 tablet (40 mg total) by mouth daily. 90 tablet 1    No current facility-administered medications for this visit.   Allergies:  Codeine, Latex, Lisinopril, Morphine, and Adhesive [tape]   Social History: The patient  reports that he has been smoking cigarettes. He has a 37.00 pack-year smoking history. He has quit using smokeless tobacco.  His smokeless tobacco use included snuff. He reports previous alcohol use. He reports current drug use. Frequency: 7.00 times per week. Drug: Marijuana.   Family History: The patient's family history includes Cancer in his father; Colon cancer in his maternal uncle; Colon cancer (age of onset: 42) in his cousin; Heart disease in his mother; Hypertension in his father and mother; Liver disease in his cousin; Lung cancer in his father, paternal uncle, paternal uncle, and paternal uncle; Stomach cancer in his father; Throat cancer in his father, paternal uncle, paternal uncle, and paternal uncle.   ROS: No palpitations or syncope.  Physical Exam: VS:  BP 124/88   Pulse 69   Ht 5\' 6"  (1.676 m)   Wt 215 lb (97.5 kg)   SpO2 96%   BMI 34.70 kg/m ,  BMI Body mass index is 34.7 kg/m.  Wt Readings from Last 3 Encounters:  11/21/20 215 lb (97.5 kg)  11/08/20 209 lb 8 oz (95 kg)  10/11/20 205 lb 6.4 oz (93.2 kg)    General: Patient appears comfortable at rest. HEENT: Conjunctiva and lids normal, wearing a mask. Neck: Supple, no elevated JVP or carotid bruits. Lungs: Clear to auscultation, nonlabored breathing at rest. Cardiac: Regular rate and rhythm, no S3 or significant systolic murmur, no pericardial rub. Abdomen: Soft, nontender, bowel sounds present. Extremities: No pitting edema, distal pulses 2+. Skin: Warm and dry. Musculoskeletal: No kyphosis. Neuropsychiatric: Alert and oriented x3, affect grossly appropriate.  ECG:  An ECG dated 10/11/2020 was personally reviewed today and demonstrated:  Normal sinus rhythm.  Recent Labwork: 04/25/2020: TSH 3.252 04/30/2020: Hemoglobin 15.1; Platelets  192 05/23/2020: ALT 14; AST 11; BUN 18; Creatinine, Ser 1.08; Potassium 4.1; Sodium 143     Component Value Date/Time   CHOL 196 04/26/2020 0457   TRIG 153 (H) 04/26/2020 0457   HDL 39 (L) 04/26/2020 0457   CHOLHDL 5.0 04/26/2020 0457   VLDL 31 04/26/2020 0457   LDLCALC 126 (H) 04/26/2020 0457    Other Studies Reviewed Today:  Chest CT 10/08/2019: IMPRESSION: 1. Thoracic aortic atherosclerosis with no evidence of aneurysm or dissection. 2. Diffuse edematous thickening of the thoracic esophagus with some faint periaortic stranding at the level of the diaphragmatic hiatus, may represent esophagitis. 3. No other acute thoracic abnormality. 4. Coronary artery atherosclerosis. 5. Paraseptal Emphysema (ICD10-J43.9). 6. Aortic Atherosclerosis (ICD10-I70.0).  Carotid Dopplers 04/26/2020: IMPRESSION: 1. Bilateral carotid bifurcation plaque resulting in less than 50% diameter ICA stenosis. 2. Antegrade bilateral vertebral arterial flow.  Echocardiogram 04/26/2020: 1. Left ventricular ejection fraction, by estimation, is 65 to 70%. The  left ventricle has normal function. The left ventricle has no regional  wall motion abnormalities. There is moderate left ventricular hypertrophy.  Left ventricular diastolic  parameters were normal.  2. Right ventricular systolic function is normal. The right ventricular  size is normal. Tricuspid regurgitation signal is inadequate for assessing  PA pressure.  3. The mitral valve is grossly normal. Trivial mitral valve  regurgitation.  4. The aortic valve is tricuspid. Aortic valve regurgitation is not  visualized.  5. The inferior vena cava is normal in size with greater than 50%  respiratory variability, suggesting right atrial pressure of 3 mmHg.   Assessment and Plan:  1.  History of recurrent chest pain in a 55 year old male with a history of tobacco abuse and COPD, hypertension, GERD, OSA intolerant of CPAP, prior TIA, atherosclerosis  evident by CT imaging, and polysubstance abuse as discussed above.  At the present time he is on aspirin and Norvasc, I encouraged him to at least go back on Zetia although ideally statin therapy would be indicated if tolerated.  He had an echocardiogram done already in August 2021 revealing LVEF 65 to 70%.  We will obtain a Lexiscan Myoview to assess ischemic burden.  Unless there are substantial high risk features, I would favor medical therapy and risk factor reduction.  2.  History of polysubstance abuse per chart review and review of records.  Unless he makes significant lifestyle changes, he will remain at significant risk for adverse outcomes.  3.  Mixed hyperlipidemia, LDL 126.  Reports intolerance to Pravachol.  I encouraged him to go back on Zetia for the time being.  Medication Adjustments/Labs and Tests Ordered: Current medicines are reviewed at length with the patient today.  Concerns  regarding medicines are outlined above.   Tests Ordered: Orders Placed This Encounter  Procedures  . NM Myocar Multi W/Spect W/Wall Motion / EF    Medication Changes: No orders of the defined types were placed in this encounter.   Disposition:  Follow up test results.  Signed, Satira Sark, MD, [facility] 11/21/2020 1:39 PM    [place] [facility] [place] [place], [place], [place] [address] Phone: [phone]; Fax: [phone]

## 2020-11-21 NOTE — Telephone Encounter (Signed)
Pre-cert Verification for the following procedure    LEXISCAN   DATE:12/06/2020  LOCATION:[facility]

## 2020-11-21 NOTE — Patient Instructions (Addendum)

## 2020-12-04 ENCOUNTER — Telehealth: Payer: Self-pay | Admitting: Cardiology

## 2020-12-04 NOTE — Telephone Encounter (Signed)
Patient notified and verbalized understanding. 

## 2020-12-04 NOTE — Telephone Encounter (Signed)
Noted.  Cardiac function normal by echocardiogram last year.  He is scheduled to undergo Myoview in a few days.  Not clear that we would need to make any medication changes as yet, he should update his PCP on symptoms as this may not be specifically cardiac in etiology.  Will forward to [facility].

## 2020-12-04 NOTE — Telephone Encounter (Signed)
Patient called concerned about swelling(arms & Legs). It has been going for about 2wks. He has been taking medication as directed. He is not sure what to do. Please call him. [phone]

## 2020-12-04 NOTE — Telephone Encounter (Signed)
Spoke with the patient. Patient sts that he has had increased swelling over the last 2 weeks. Patient reports bilateral LE edema, swelling in both arms and in his abdomen. He is on his feet all day long. He has copd and is usually sob. He reports increased DOE over the last couple of weeks. He denies orthopnea or pnd. He denies CP, cough,n/v. He does not weigh daily. He reports that his pants waist are tighter and he feels that his weight is up. He denies increased caloric intake.   Adv the patient that he could try wearing compression stockings daily and he should elevate his legs as much as he can. Adv the patient that I will fwd the update to Dr. Domenic Polite for his recommendation. He is to contact the office sooner if symptoms worsen.

## 2020-12-05 ENCOUNTER — Telehealth: Payer: Self-pay

## 2020-12-05 NOTE — Telephone Encounter (Signed)
Returned call to patient. No answer. Left message on VM requesting return call.  °

## 2020-12-05 NOTE — Telephone Encounter (Signed)
Pt is having swelling in legs [phone]

## 2020-12-06 ENCOUNTER — Encounter (HOSPITAL_COMMUNITY)
Admission: RE | Admit: 2020-12-06 | Discharge: 2020-12-06 | Disposition: A | Discharge status: Home / Self Care | Payer: 59 | Source: Ambulatory Visit | Attending: Cardiology | Admitting: Cardiology

## 2020-12-06 ENCOUNTER — Encounter (HOSPITAL_BASED_OUTPATIENT_CLINIC_OR_DEPARTMENT_OTHER)
Admission: RE | Admit: 2020-12-06 | Discharge: 2020-12-06 | Disposition: A | Discharge status: Home / Self Care | Payer: 59 | Source: Ambulatory Visit | Attending: Cardiology | Admitting: Cardiology

## 2020-12-06 ENCOUNTER — Encounter (HOSPITAL_COMMUNITY): Payer: Self-pay

## 2020-12-06 DIAGNOSIS — R079 Chest pain, unspecified: Secondary | ICD-10-CM | POA: Diagnosis not present

## 2020-12-06 LAB — NM MYOCAR MULTI W/SPECT W/WALL MOTION / EF
LV dias vol: 96 mL (ref 62–150)
LV sys vol: 36 mL
Peak HR: 84 {beats}/min
RATE: 0.41
Rest HR: 59 {beats}/min
SDS: 4
SRS: 0
SSS: 4
TID: 1.2

## 2020-12-06 MED ORDER — SODIUM CHLORIDE FLUSH 0.9 % IV SOLN
INTRAVENOUS | Status: AC
  Administered 2020-12-06: 10 mL via INTRAVENOUS
  Filled 2020-12-06: qty 10

## 2020-12-06 MED ORDER — TECHNETIUM TC 99M TETROFOSMIN IV KIT
10.0000 | PACK | Freq: Once | INTRAVENOUS | Status: AC | PRN
  Administered 2020-12-06: 10.87 via INTRAVENOUS

## 2020-12-06 MED ORDER — TECHNETIUM TC 99M TETROFOSMIN IV KIT
30.0000 | PACK | Freq: Once | INTRAVENOUS | Status: AC | PRN
  Administered 2020-12-06: 32 via INTRAVENOUS

## 2020-12-06 MED ORDER — REGADENOSON 0.4 MG/5ML IV SOLN
INTRAVENOUS | Status: AC
  Administered 2020-12-06: 0.4 mg via INTRAVENOUS
  Filled 2020-12-06: qty 5

## 2020-12-07 ENCOUNTER — Telehealth: Payer: Self-pay | Admitting: Cardiology

## 2020-12-07 NOTE — Telephone Encounter (Signed)
Calling for stress test results  

## 2020-12-07 NOTE — Telephone Encounter (Signed)
Laurine Blazer, LPN  04/13/3816 7:11 PM EDT Back to Top     Notified, copy to pcp. States that he has visit with his pcp already scheduled for this Monday.    Satira Sark, MD  12/06/2020 3:36 PM EDT      Results reviewed. Please let him know that the stress test was overall low risk, no clear evidence of ischemia to suggest obstructive CAD as cause of his symptoms. LVEF also normal. Would recommend continued follow-up with PCP for risk factor modification. No further cardiac testing is planned.

## 2020-12-10 ENCOUNTER — Encounter: Payer: 59 | Admitting: Internal Medicine

## 2020-12-11 ENCOUNTER — Other Ambulatory Visit: Payer: Self-pay

## 2020-12-11 ENCOUNTER — Encounter: Payer: Self-pay | Admitting: Internal Medicine

## 2020-12-11 ENCOUNTER — Ambulatory Visit (INDEPENDENT_AMBULATORY_CARE_PROVIDER_SITE_OTHER): Payer: 59 | Admitting: Internal Medicine

## 2020-12-11 VITALS — BP 141/98 | HR 66 | Temp 98.4°F | Wt 211.9 lb

## 2020-12-11 DIAGNOSIS — F419 Anxiety disorder, unspecified: Secondary | ICD-10-CM | POA: Diagnosis not present

## 2020-12-11 DIAGNOSIS — M5412 Radiculopathy, cervical region: Secondary | ICD-10-CM

## 2020-12-11 DIAGNOSIS — F1110 Opioid abuse, uncomplicated: Secondary | ICD-10-CM

## 2020-12-11 DIAGNOSIS — M25562 Pain in left knee: Secondary | ICD-10-CM | POA: Diagnosis not present

## 2020-12-11 DIAGNOSIS — I10 Essential (primary) hypertension: Secondary | ICD-10-CM

## 2020-12-11 DIAGNOSIS — R319 Hematuria, unspecified: Secondary | ICD-10-CM | POA: Insufficient documentation

## 2020-12-11 DIAGNOSIS — R3121 Asymptomatic microscopic hematuria: Secondary | ICD-10-CM

## 2020-12-11 MED ORDER — BUPRENORPHINE HCL-NALOXONE HCL 8-2 MG SL FILM: 1.0000 | ORAL_FILM | Freq: Two times a day (BID) | SUBLINGUAL | 0 refills | Status: DC

## 2020-12-11 MED ORDER — CLONIDINE HCL 0.1 MG PO TABS: 0.2000 mg | ORAL_TABLET | Freq: Two times a day (BID) | ORAL | 2 refills | Status: DC

## 2020-12-11 MED ORDER — HYDROCHLOROTHIAZIDE 25 MG PO TABS: 25.0000 mg | ORAL_TABLET | Freq: Every day | ORAL | 2 refills | Status: DC

## 2020-12-11 MED ORDER — GABAPENTIN 300 MG PO CAPS: 300.0000 mg | ORAL_CAPSULE | Freq: Three times a day (TID) | ORAL | 2 refills | Status: DC

## 2020-12-11 NOTE — Assessment & Plan Note (Signed)
Patient reports acute onset of left knee pain for the past few weeks.  Denies trauma but thinks it may be due to a twisting injury.  States the pain is all over and mostly anterior, throbbing in nature.  He is continuing to ambulate despite the pain.  He denies any previous injuries.  No swelling or bruising on exam.  Range of motion limited due to pain.  Anterior posterior drawer test was negative.  McMurray's test positive.  Concern for meniscal injury or tear.  Patient is not interested in surgical interventions.  Discussed referral to sports medication for further evaluation and management.  Patient agreeable to this plan.  Plan: Refer to sports medicine

## 2020-12-11 NOTE — Progress Notes (Signed)
   CC: OUD, left knee pain, lower extremity swelling  HPI:  [name] is a 55 y.o. with a past medical history listed below presenting for follow-up regarding his opiate use disorder.  He is also complaining of lower extremity swelling and left knee pain.  For details of today's visit and the status of his chronic medical issues please refer to the assessment and plan.   Past Medical History:  Diagnosis Date  . Acute pancreatitis   . Anxiety   . Arthritis   . Colonic adenoma   . COPD (chronic obstructive pulmonary disease) ([place])   . Depression   . Diverticulitis of colon with perforation 08/2010  . GERD (gastroesophageal reflux disease)   . History of hiatal hernia   . Hypertension   . Polysubstance abuse ([place])   . Renal insufficiency   . Sleep apnea    Intolerant of CPAP  . TIA (transient ischemic attack)    Review of Systems:   Review of Systems  Constitutional: Negative for fever, malaise/fatigue and weight loss.  Respiratory: Negative for cough, sputum production and shortness of breath.   Cardiovascular: Positive for leg swelling. Negative for chest pain, orthopnea and claudication.  Gastrointestinal: Negative for abdominal pain, constipation, diarrhea, nausea and vomiting.  Genitourinary: Negative for dysuria and frequency.  Musculoskeletal: Positive for joint pain. Negative for falls.  Neurological: Positive for tingling. Negative for dizziness, weakness and headaches.     Physical Exam:  There were no vitals filed for this visit.  Physical Exam Vitals reviewed.  Constitutional:      General: He is not in acute distress.    Appearance: Normal appearance. He is not ill-appearing.  Cardiovascular:     Rate and Rhythm: Normal rate and regular rhythm.     Pulses: Normal pulses.     Heart sounds: Normal heart sounds. No murmur heard. No friction rub. No gallop.   Pulmonary:     Effort: Pulmonary effort is normal. No respiratory distress.     Breath  sounds: Normal breath sounds. No wheezing or rales.  Abdominal:     General: Abdomen is flat. Bowel sounds are normal. There is no distension.     Palpations: Abdomen is soft.     Tenderness: There is no abdominal tenderness. There is no guarding.  Musculoskeletal:        General: Tenderness present. No swelling.     Right lower leg: Edema present.     Left lower leg: Edema present.     Comments: Bilateral lower extremity nonpitting edema.  Left knee is tender to palpation.  No erythema or ecchymoses of the left knee.  Anterior posterior drawer test negative.  McMurray's test positive.  States pain is worse with twisting motions.  Skin:    General: Skin is warm and dry.  Neurological:     Mental Status: He is alert and oriented to person, place, and time.     Motor: No weakness.  Psychiatric:        Mood and Affect: Mood normal.        Behavior: Behavior normal.        Thought Content: Thought content normal.        Judgment: Judgment normal.     Assessment & Plan:   See Encounters Tab for problem based charting.  Patient discussed with Dr. Daryll Drown

## 2020-12-11 NOTE — Assessment & Plan Note (Signed)
Patient reports longstanding history of bilateral upper extremity numbness and tingling.  He states that he is always worked with his hands.  They are often numb and sometimes keep him up at night due to the pain.  He states in the past he was on gabapentin but this was discontinued.  States that he was told he had pinched nerves in his cervical spine at the time that he had a stroke.  On exam he had decreased sensation of bilateral hands.  Strength was 5 out of 5.  Plan: Restart gabapentin 300 mg 3 times daily, uptitrate as needed

## 2020-12-11 NOTE — Assessment & Plan Note (Signed)
Patient's last urine drug screen was positive for alprazolam.  Patient states that he takes a friend's prescription for anxiety and to also help with his appetite.  States that he has taken Xanax in the past for these reasons.  Discussed that it is not advisable to take a prescription medication that is not his and discussed referral to psychiatry to determine if patient needs to be on this medication and may benefit from this prescription.  Patient agreeable to this plan.  Plan: Referral to psychiatry

## 2020-12-11 NOTE — Assessment & Plan Note (Signed)
Last UA showed a small amount of hemoglobin.  Patient denies any blood in urine.  Will recheck UA today.

## 2020-12-11 NOTE — Assessment & Plan Note (Addendum)
[name] presents for follow-up of opioid use disorder I have reviewed the prior injection visit, follow-up visits and telephone encounters relevant to the opiate use disorder treatment.  Mentions no cravings, denies significant side effects.  States some days he only takes 1 film  Current daily dose: Suboxone 8-2 mg 2 films a day  Date of induction: 08/16/2020  Current follow-up interval: 4 weeks  Patient has been adherent with the buprenorphine for OUD contract.  Last UDS result: Positive for buprenorphine, Xanax, THC

## 2020-12-11 NOTE — Patient Instructions (Addendum)
It was a pleasure meeting you today!  I have sent in a refill for your Suboxone for 4 weeks.  Plan to follow-up in 4 weeks.  I also want you to stop your amlodipine 10 mg due to your lower extremity swelling.  I am switching his medication to hydrochlorothiazide 25 mg once daily.  We will follow up in 4 weeks to see how you are tolerating this medication.  For your left knee pain I am putting in a referral to sports medicine to help further evaluate and manage this pain.  For your hand numbness I am going to restart you on gabapentin 300 mg 3 times daily and we can uptitrate this dose as needed.

## 2020-12-13 NOTE — Addendum Note (Signed)
Addended by: Mike Craze on: 12/13/2020 09:48 AM   Modules accepted: Orders

## 2020-12-19 ENCOUNTER — Encounter: Payer: 59 | Admitting: Internal Medicine

## 2020-12-19 ENCOUNTER — Encounter: Payer: Self-pay | Admitting: Internal Medicine

## 2020-12-20 ENCOUNTER — Ambulatory Visit: Payer: 59 | Admitting: Sports Medicine

## 2020-12-23 NOTE — Progress Notes (Signed)
Internal Medicine Clinic Attending ° °Case discussed with Dr. Rehman  At the time of the visit.  We reviewed the resident’s history and exam and pertinent patient test results.  I agree with the assessment, diagnosis, and plan of care documented in the resident’s note.  ° °

## 2020-12-25 ENCOUNTER — Ambulatory Visit: Payer: 59 | Admitting: Gastroenterology

## 2020-12-25 ENCOUNTER — Encounter: Payer: Self-pay | Admitting: Internal Medicine

## 2020-12-26 ENCOUNTER — Ambulatory Visit: Payer: 59 | Admitting: Family Medicine

## 2020-12-26 NOTE — Addendum Note (Signed)
Addended by: Hulan Fray on: 12/26/2020 05:12 PM   Modules accepted: Orders

## 2021-01-09 NOTE — Progress Notes (Signed)
   CC: OUD, erectile dysfunction, hypertension  HPI:  [name] is a 55 y.o. with a history of hypertension, COPD, GERD, cervical radiculopathy, opiate use disorder on methadone, anxiety, and hyperlipidemia presenting for follow-up of his OUD, erectile dysfunction, and hypertension.  Past Medical History:  Diagnosis Date  . Acute pancreatitis   . Anxiety   . Arthritis   . Colonic adenoma   . COPD (chronic obstructive pulmonary disease) ([place])   . Depression   . Diverticulitis of colon with perforation 08/2010  . GERD (gastroesophageal reflux disease)   . History of hiatal hernia   . Hypertension   . Polysubstance abuse ([place])   . Renal insufficiency   . Sleep apnea    Intolerant of CPAP  . TIA (transient ischemic attack)    Review of Systems:   Constitutional: Negative for chills and fever.  Respiratory: Negative for shortness of breath.   Cardiovascular: Negative for chest pain and leg swelling.  Gastrointestinal: Negative for abdominal pain, nausea and vomiting.  Neurological: Negative for dizziness and headaches.  Psychiatry: Positive for anxiety.   Physical Exam:  Vitals:   01/10/21 0908  BP: (!) 110/96  Pulse: 75  Temp: 98.2 F (36.8 C)  TempSrc: Oral  SpO2: 97%  Weight: 202 lb 8 oz (91.9 kg)  Height: 5\' 7"  (1.702 m)   Physical Exam Constitutional:      Appearance: Normal appearance.  HENT:     Mouth/Throat:     Mouth: Mucous membranes are moist.     Pharynx: Oropharynx is clear.  Cardiovascular:     Rate and Rhythm: Normal rate and regular rhythm.     Pulses: Normal pulses.     Heart sounds: Normal heart sounds.  Pulmonary:     Effort: Pulmonary effort is normal.     Breath sounds: Normal breath sounds.  Abdominal:     General: Abdomen is flat. Bowel sounds are normal.     Palpations: Abdomen is soft.  Genitourinary:    Penis: Normal and circumcised.      Testes:        Right: Mass not present. Cremasteric reflex is absent.          Left: Mass not present. Cremasteric reflex is absent.   Skin:    General: Skin is warm and dry.  Neurological:     Mental Status: He is alert.  Psychiatric:     Comments: Anxious appearing      Assessment & Plan:   See Encounters Tab for problem based charting.  Patient discussed with Dr. Dareen Piano

## 2021-01-10 ENCOUNTER — Ambulatory Visit (INDEPENDENT_AMBULATORY_CARE_PROVIDER_SITE_OTHER): Payer: 59 | Admitting: Internal Medicine

## 2021-01-10 ENCOUNTER — Encounter: Payer: Self-pay | Admitting: Internal Medicine

## 2021-01-10 ENCOUNTER — Institutional Professional Consult (permissible substitution): Payer: 59 | Admitting: Behavioral Health

## 2021-01-10 VITALS — BP 110/96 | HR 75 | Temp 98.2°F | Ht 67.0 in | Wt 202.5 lb

## 2021-01-10 DIAGNOSIS — N521 Erectile dysfunction due to diseases classified elsewhere: Secondary | ICD-10-CM | POA: Diagnosis not present

## 2021-01-10 DIAGNOSIS — N529 Male erectile dysfunction, unspecified: Secondary | ICD-10-CM

## 2021-01-10 DIAGNOSIS — F1110 Opioid abuse, uncomplicated: Secondary | ICD-10-CM

## 2021-01-10 DIAGNOSIS — F419 Anxiety disorder, unspecified: Secondary | ICD-10-CM

## 2021-01-10 DIAGNOSIS — R319 Hematuria, unspecified: Secondary | ICD-10-CM

## 2021-01-10 DIAGNOSIS — I10 Essential (primary) hypertension: Secondary | ICD-10-CM

## 2021-01-10 NOTE — Assessment & Plan Note (Signed)
Patient is currently taking clonidine 0.2 milligrams twice daily and HCTZ 25 mg daily, he is not taking amlodipine due to his lower extremity swelling.  States that his swelling has improved.  Blood pressure today is 110/96.  Denies any issues taking his medications. -Continue his current medication dose

## 2021-01-10 NOTE — Patient Instructions (Addendum)
[name],  It was a pleasure to see you today. Thank you for coming in.   Today we discussed your Suboxone. In regards to this we will send in a refill for your Suboxone.   We also discussed your other issues. I have placed a referral to urology to further evaluate your genital issues. I will repeat some urine studies today.    I will look into making sure you have the psychiatry referral.   Please return to clinic in 1 month or sooner if needed.   Thank you again for coming in.   Asencion Noble.D.

## 2021-01-10 NOTE — Assessment & Plan Note (Signed)
[name] presents for follow up of opioid use disorder I have reviewed the prior induction visit, follow up visits, and telephone encounters relevant to opiate use disorder (OUD) treatment.   Current daily dose: Suboxone 8-2 mg 2 films daily  Date of Induction: 08/16/2020  Current follow up interval, in weeks: 4 weeks  The patient has been adherent with the buprenorphine for OUD contract.    Last UDS Result: Positive for buprenorphine, Xanax, THC  Patient reports that he was taking his Suboxone twice a day as prescribed however states that he ran out about 2 days ago, thinks that he may have taken it more than normal was not able to tell me when this may have occurred.  He states that he is having some neck and back pain, denies any nausea, GI upset, restlessness or sweating. Denies any cravings.  Discussed the importance of taking his medications as prescribed and not taking additional Suboxone films.  -Repeat tox assure today -Continue Suboxone 8-2 mg 2 films per day, will send message to the OUD provider to help with prescription

## 2021-01-10 NOTE — Assessment & Plan Note (Addendum)
  Patient reports that he has been having issues with erectile dysfunction for the past 3 to 4 months, states that it occurred around the time he had a stroke, on chart review patient was admitted in 04/2020 for a possible TIA, Mri showed no evidence of stroke.  He states that he is not getting any morning erections and has not been able to ejaculate. He reports that he still has a desire for intercourse, and that this has been causing some strain in his relationship.  On exam he has no obvious deformities noted, testes appropriately sized, cremasteric reflex was not able to be elicited. Patient has anxiety and will occasionally take his aunts Xanax, he also is on gabapentin and Suboxone, and has history of hypertension, hyperlipidemia and tobacco use so this could be related to vascular or psychogenic causes.  However given his sudden onset in reported lack of morning erections is concerned that this could be related to a neurological injury. Patient was also noted to have a history of hematuria that was noted on urinalysis past year. Will refer to urology for further work up and hematuria evaluation.  Bonna Gains referral -Repeat U/A today

## 2021-01-10 NOTE — Assessment & Plan Note (Signed)
Patient continues to endorse significant anxiety, has not heard from our [facility] specialist yet.  States that he continues to take Xanax from his Aunt occasionally to help with his anxiety.  We discussed that this is not appropriate and that he should not continue doing this.  Will follow-up on psychiatry referral.

## 2021-01-11 ENCOUNTER — Other Ambulatory Visit: Payer: Self-pay

## 2021-01-11 DIAGNOSIS — F1110 Opioid abuse, uncomplicated: Secondary | ICD-10-CM

## 2021-01-11 LAB — MICROSCOPIC EXAMINATION
Bacteria, UA: NONE SEEN
Casts: NONE SEEN /lpf
Epithelial Cells (non renal): NONE SEEN /hpf (ref 0–10)
RBC, Urine: NONE SEEN /hpf (ref 0–2)

## 2021-01-11 LAB — URINALYSIS, COMPLETE
Bilirubin, UA: NEGATIVE
Glucose, UA: NEGATIVE
Ketones, UA: NEGATIVE
Nitrite, UA: NEGATIVE
Protein,UA: NEGATIVE
RBC, UA: NEGATIVE
Specific Gravity, UA: 1.021 (ref 1.005–1.030)
Urobilinogen, Ur: 0.2 mg/dL (ref 0.2–1.0)
pH, UA: 5 (ref 5.0–7.5)

## 2021-01-11 MED ORDER — BUPRENORPHINE HCL-NALOXONE HCL 8-2 MG SL FILM: 1.0000 | ORAL_FILM | Freq: Two times a day (BID) | SUBLINGUAL | 0 refills | Status: DC

## 2021-01-11 NOTE — Progress Notes (Signed)
Internal Medicine Clinic Attending ° °Case discussed with Dr. Krienke  At the time of the visit.  We reviewed the resident’s history and exam and pertinent patient test results.  I agree with the assessment, diagnosis, and plan of care documented in the resident’s note.  °

## 2021-01-11 NOTE — Telephone Encounter (Signed)
Pt states he need  Buprenorphine HCl-Naloxone HCl (SUBOXONE) 8-2 MG FILM by today. Please call pt back.

## 2021-01-22 LAB — TOXASSURE SELECT,+ANTIDEPR,UR

## 2021-01-24 ENCOUNTER — Other Ambulatory Visit: Payer: Self-pay

## 2021-01-24 ENCOUNTER — Ambulatory Visit: Payer: 59 | Admitting: Behavioral Health

## 2021-01-24 DIAGNOSIS — F419 Anxiety disorder, unspecified: Secondary | ICD-10-CM

## 2021-01-24 DIAGNOSIS — F1110 Opioid abuse, uncomplicated: Secondary | ICD-10-CM

## 2021-01-24 NOTE — BH Specialist Note (Signed)
Integrated Behavioral Health via Telemedicine Visit  01/24/2021 [name] [id number]  Number of [place] visits: 1/6 Session Start time: 9:30am  Session End time: 9:50am Total time: 20  Referring Provider: Dr. Harlow Ohms, DO Patient/Family location: Pt @ work in private space [facility] Provider location: [facility] All persons participating in visit: Pt & Clinician Types of Service: Introduction only and Referral to [place] for med mgmt & alternatives to Xanax use for stomach issues  I connected with [name] and/or [name]'s self via  Telephone or Video Enabled Telemedicine Application  (Video is Caregility application) and verified that I am speaking with the correct person using two identifiers. Discussed confidentiality: Yes   I discussed the limitations of telemedicine and the availability of in person appointments.  Discussed there is a possibility of technology failure and discussed alternative modes of communication if that failure occurs.  I discussed that engaging in this telemedicine visit, they consent to the provision of behavioral healthcare and the services will be billed under their insurance.  Patient and/or legal guardian expressed understanding and consented to Telemedicine visit: Yes   Presenting Concerns: Patient and/or family reports the following symptoms/concerns: concerns for securing a prescription to replace his use of Xanax to support recovery & relapse prevention Duration of problem: years; Severity of problem: moderate  Patient and/or Family's Strengths/Protective Factors: Concrete supports in place (healthy food, safe environments, etc.), Sense of purpose and patient resilience & concerns for transitioning off Xanax use to alternative options so he can eat  Goals Addressed: Patient will: 1.  Reduce symptoms of: anx/dep & substance use 2.  Increase knowledge and/or ability of: coping skills and  self-management skills  3.  Demonstrate ability to: Increase healthy adjustment to current life circumstances, Increase motivation to adhere to plan of care and Improve medication compliance  Progress towards Goals: Estb'd today; Pt will f/u with Referral suggestion @ [facility] to address medication needs  Interventions: Interventions utilized:  Behavioral Activation and Supportive Counseling Standardized Assessments completed: f/u with screeners as appropriate  Patient and/or Family Response: Pt receptive & aware of call today. Pt endorses visit to Referral to address his medication needs.   Assessment: Patient currently experiencing elevated anxiety due to lack of proper medications to ease his anx/dep & address his eating issues. Pt also adjusting to health status changes & trying to adhere to [facility] visits.  Patient may benefit from Referral appropriate to his needs, f/u with this Facility, & psychotherapy to address mental health wellness/recovery support/relapse prevention needs.  Plan: 1. Follow up with behavioral health clinician on : Say hello to Clinician on appt to [facility] on 02/10/2021. 2. Behavioral recommendations: F/U on Referral w/specific instructions included on the flyer/note from Clinician. 3. Referral(s): Community Resources:  [place] for [employer] & med mgmt  I discussed the assessment and treatment plan with the patient and/or parent/guardian. They were provided an opportunity to ask questions and all were answered. They agreed with the plan and demonstrated an understanding of the instructions.   They were advised to call back or seek an in-person evaluation if the symptoms worsen or if the condition fails to improve as anticipated.  Jason Hutching, LMFT

## 2021-02-07 ENCOUNTER — Other Ambulatory Visit: Payer: Self-pay

## 2021-02-07 ENCOUNTER — Ambulatory Visit (INDEPENDENT_AMBULATORY_CARE_PROVIDER_SITE_OTHER): Payer: 59 | Admitting: Internal Medicine

## 2021-02-07 ENCOUNTER — Ambulatory Visit: Payer: 59 | Admitting: Behavioral Health

## 2021-02-07 ENCOUNTER — Encounter: Payer: Self-pay | Admitting: Internal Medicine

## 2021-02-07 VITALS — BP 130/82 | HR 72 | Temp 97.7°F | Ht 67.0 in | Wt 206.6 lb

## 2021-02-07 DIAGNOSIS — F419 Anxiety disorder, unspecified: Secondary | ICD-10-CM

## 2021-02-07 DIAGNOSIS — F1110 Opioid abuse, uncomplicated: Secondary | ICD-10-CM

## 2021-02-07 DIAGNOSIS — F331 Major depressive disorder, recurrent, moderate: Secondary | ICD-10-CM

## 2021-02-07 DIAGNOSIS — N521 Erectile dysfunction due to diseases classified elsewhere: Secondary | ICD-10-CM | POA: Diagnosis not present

## 2021-02-07 MED ORDER — SILDENAFIL CITRATE 50 MG PO TABS
50.0000 mg | ORAL_TABLET | ORAL | 1 refills | Status: DC | PRN
Start: 2021-02-07 — End: 2021-04-09

## 2021-02-07 MED ORDER — BUPRENORPHINE HCL-NALOXONE HCL 8-2 MG SL FILM: 1.0000 | ORAL_FILM | Freq: Two times a day (BID) | SUBLINGUAL | 1 refills | Status: DC

## 2021-02-07 MED ORDER — DULOXETINE HCL 30 MG PO CPEP: 30.0000 mg | ORAL_CAPSULE | Freq: Every day | ORAL | 0 refills | Status: DC

## 2021-02-07 NOTE — Progress Notes (Signed)
   CC: OUD, Erectile dysfunction  HPI:  [name] is a 55 y.o. with a past medical history listed below presenting for opiate use disorder and follow-up of erectile dysfunction. For details of today's visit and the status of his chronic medical issues please refer to the assessment and plan.   Past Medical History:  Diagnosis Date  . Acute pancreatitis   . Anxiety   . Arthritis   . Colonic adenoma   . COPD (chronic obstructive pulmonary disease) ([place])   . Depression   . Diverticulitis of colon with perforation 08/2010  . GERD (gastroesophageal reflux disease)   . History of hiatal hernia   . Hypertension   . Polysubstance abuse ([place])   . Renal insufficiency   . Sleep apnea    Intolerant of CPAP  . TIA (transient ischemic attack)    Review of Systems:   Review of Systems  Constitutional: Negative for chills, fever and malaise/fatigue.  Neurological: Positive for dizziness. Negative for weakness and headaches.  Psychiatric/Behavioral: Positive for depression and memory loss. Negative for suicidal ideas. The patient is nervous/anxious and has insomnia.      Physical Exam:  Vitals:   02/07/21 0859  BP: 130/82  Pulse: 72  Temp: 97.7 F (36.5 C)  TempSrc: Oral  SpO2: 96%  Weight: 206 lb 9.6 oz (93.7 kg)  Height: 5\' 7"  (1.702 m)    Physical Exam Constitutional:      General: He is not in acute distress.    Appearance: Normal appearance. He is not ill-appearing.  Cardiovascular:     Rate and Rhythm: Normal rate and regular rhythm.     Pulses: Normal pulses.     Heart sounds: Normal heart sounds. No murmur heard. No friction rub. No gallop.   Pulmonary:     Effort: Pulmonary effort is normal. No respiratory distress.     Breath sounds: Normal breath sounds. No wheezing or rales.  Abdominal:     General: Abdomen is flat. Bowel sounds are normal. There is no distension.     Palpations: Abdomen is soft.     Tenderness: There is no abdominal tenderness.   Musculoskeletal:        General: No swelling or tenderness.     Right lower leg: No edema.     Left lower leg: No edema.  Skin:    General: Skin is warm and dry.  Neurological:     Mental Status: He is alert and oriented to person, place, and time.  Psychiatric:        Mood and Affect: Mood normal.        Behavior: Behavior normal.        Thought Content: Thought content normal.        Judgment: Judgment normal.     Assessment & Plan:   See Encounters Tab for problem based charting.  Patient discussed with Dr. Evette Doffing

## 2021-02-07 NOTE — BH Specialist Note (Signed)
Integrated Behavioral Health Initial In-Person Visit  MRN: [id number] Name: [name]  Number of [place] Clinician visits:: 1/6 Session Start time: 9:45am  Session End time: 10:00am Total time: 15 minutes  Types of Service: Health Promotion and Introduction only  Interpretor:No. Interpretor Name and Language: n/a    Warm Hand Off Completed.       Subjective: [name] is a 55 y.o. male accompanied by self Patient was referred by self for mental health needs & Recovery support/relapse prevention, & support for caregiving status since [name]' cancer Dx. Patient reports the following symptoms/concerns: anxiety is worse than depression. Pt requests a medication that will assist w/his Sx of dep. Pt cannot use the CPAP; "I can't breathe." Duration of problem: months; Severity of problem: moderate  Objective: Mood: Anxious and Affect: Appropriate Risk of harm to self or others: No plan to harm self or others  Life Context: Family and Social: [name] lives beside him in [place]. Pt is all [name] has & he is currently Admitted since last Fri, May 27th, 2022. School/Work: Pt works in [employer] in [employer] is worried for his work getting behind. Self-Care: Pt does little to care for self in compassionate ways. Life Changes: Pt has recently been caregiving for his [name] who has small cell carcinoma. He was recently admitted to [name] for back surgery after transfer from [facility]  Patient and/or Family's Strengths/Protective Factors: Concrete supports in place (healthy food, safe environments, etc.), Sense of purpose and Physical Health (exercise, healthy diet, medication compliance, etc.) Pt requires suggestions for CPAP compliance & open to suggestion for routine exer to care for self.   Goals Addressed: Patient will: 1. Reduce symptoms of: anxiety, depression and stress 2. Increase knowledge and/or ability of: coping skills, healthy habits,  self-management skills and stress reduction  3. Demonstrate ability to: Increase healthy adjustment to current life circumstances and Increase adequate support systems for patient/family  Progress towards Goals: Other - not entirely covered today in 15 min. Pt needs assistance getting ppw completed for [name].  Interventions: Interventions utilized: Link to [employer] Assessments completed: screeners prn  Patient and/or Family Response: Pt receptive to short visit today for introduction & future scheduling.  Patient Centered Plan: Patient is on the following Treatment Plan(s):  Recovery Support/Relapse Prevention & Caregiver support  Assessment: Patient currently experiencing elevated anxiety due to hospitalization of [name] & handling many extra responsibilities for him in the Interim.   Patient may benefit from SW support for his [name]' needs & support for his new role in the Family.  Plan: 1. Follow up with behavioral health clinician on : 2-3 wks for 60 min on telehealth 2. Behavioral recommendations: Visit [name] on 4th [facility] & visit w/Clinician @ later scheduled session for 60 min telehealth 3. Referral(s): Community Resources:  Oneal ppw support for [name]- and -call [place] to see what is covered for [name]' care InPt 4. "From scale of 1-10, how likely are you to follow plan?": [place], LMFT

## 2021-02-07 NOTE — Patient Instructions (Signed)
[name],   It was a pleasure seeing you today. Today we discussed the following:   1. Suboxone refill has been sent in. We can follow up in 8 weeks. 2. Erectile dysfunction-we have sent in a prescription for Viagra 20 tablets.  Take this on an empty stomach 2 hours before intercourse. 3. For your depression anxiety and chronic pain I have sent in a prescription for duloxetine (Cymbalta).  Take 30 mg once daily for 1 week and then increase to 60 mg daily.    Please call the internal medicine center clinic if you have any questions or concerns, we may be able to help and keep you from a long and expensive emergency room wait. Our clinic and after hours phone number is [phone], the best time to call is Monday through Friday 9 am to 4 pm but there is always someone available 24/7 if you have an emergency. If you need medication refills please notify your pharmacy one week in advance and they will send [place] a request.   Thank you for allowing [place] to be a part of your care!

## 2021-02-08 NOTE — Progress Notes (Signed)
Internal Medicine Clinic Attending ° °Case discussed with Dr. Rehman  At the time of the visit.  We reviewed the resident’s history and exam and pertinent patient test results.  I agree with the assessment, diagnosis, and plan of care documented in the resident’s note.  ° °

## 2021-02-08 NOTE — Assessment & Plan Note (Signed)
[name] presents for follow up of opioid use disorder I have reviewed the prior induction visit, follow up visits, and telephone encounters relevant to opiate use disorder (OUD) treatment.   Current daily dose: Suboxone 8-2 mg 2 films daily  Date of Induction: 08/16/2020  Current follow up interval, in weeks: 4 weeks, changing to 8 weeks after today  The patient has been adherent with the buprenorphine for OUD contract.    Last UDS Result: 01/10/21 Positive for buprenorphine, Xanax, THC   -Continue Suboxone 8-2 mg 2 films per day, can start following up every 8 weeks

## 2021-02-08 NOTE — Assessment & Plan Note (Addendum)
Patient reports that he has not been able to see urology or get scheduled appointment.  States that he is continue to have issues with erectile dysfunction for the past several months.  There was concern for neurological injury due to lack of morning erections and absence of cremasteric reflux.  He had a TIA in August 2021, no evidence of stroke.  Could be neurologic versus vascular or psychogenic etiology.  For now discussed treating his erectile dysfunction with Viagra.  Plan: Start Viagra 50 mg as needed, instructed to use 2 hours before intercourse on an empty stomach

## 2021-02-08 NOTE — Assessment & Plan Note (Addendum)
Patient continues to endorse significant anxiety.  Currently has a brother who is in the hospital with metastatic cancer which has worsened his anxiety.  States that he has continued to take Xanax from his aunt nearly daily.  Discussed that this is not appropriate and especially the risk of using with Suboxone.  Patient expressed understanding however stated he would need something else to help his anxiety.  Discussed starting duloxetine which may help target his depression anxiety and pain.  Patient is also to follow-up with Dr. Carolynne Edouard today.  Plan: Start duloxetine 30 mg daily for 1 week and then increase to 60 mg daily Follow with Dr. Carolynne Edouard Follow-up in 4-6 weeks

## 2021-02-11 ENCOUNTER — Telehealth: Payer: Self-pay | Admitting: *Deleted

## 2021-02-11 NOTE — Telephone Encounter (Signed)
Information was sent through Cover[place]Meds for [place] for Sildenafil 50 mg.  Awaiting determination within 24-72 hours.  [employer] [phone].  Sander Nephew, RN 02/11/2021 12:16 PM.

## 2021-02-12 ENCOUNTER — Telehealth: Payer: Self-pay | Admitting: Internal Medicine

## 2021-02-12 NOTE — Telephone Encounter (Signed)
Pt requesting a different Medication be called in.  Patient states medication is making him have a lot of diarrhea and causing his head to swim.  Please call the patient back about the following medication  DULoxetine (CYMBALTA) 30 MG capsule

## 2021-02-12 NOTE — Telephone Encounter (Signed)
Front desk--please arrange a tele-health visit for further evaluation. Thank you

## 2021-02-15 NOTE — Addendum Note (Signed)
Addended by: Truddie Crumble on: 02/15/2021 11:48 AM   Modules accepted: Orders

## 2021-02-18 ENCOUNTER — Ambulatory Visit (INDEPENDENT_AMBULATORY_CARE_PROVIDER_SITE_OTHER): Payer: 59 | Admitting: Internal Medicine

## 2021-02-18 VITALS — BP 127/89 | HR 77 | Temp 98.0°F | Ht 67.0 in | Wt 206.0 lb

## 2021-02-18 DIAGNOSIS — F419 Anxiety disorder, unspecified: Secondary | ICD-10-CM

## 2021-02-18 DIAGNOSIS — F331 Major depressive disorder, recurrent, moderate: Secondary | ICD-10-CM | POA: Diagnosis not present

## 2021-02-18 DIAGNOSIS — L720 Epidermal cyst: Secondary | ICD-10-CM | POA: Diagnosis not present

## 2021-02-18 MED ORDER — VENLAFAXINE HCL ER 37.5 MG PO CP24: 37.5000 mg | ORAL_CAPSULE | Freq: Every day | ORAL | 0 refills | Status: DC

## 2021-02-18 NOTE — Patient Instructions (Signed)
Mr. Goeller,   It was a pleasure seeing you today. Today we discussed the following:   Depression/anxiety- I will determine which formulary of Buspar you are on in the past and see if I can send this into your pharmacy.  I will give you a call to let you know which antidepressant/antianxiolytic I sent in. Skin lesion-try to keep this skin lesion covered with gauze and a Band-Aid.  Also try soaking this area to see if it will help drain.  Let me know if this worsens.  The [facility] number is [phone].  You should be able to call them to reschedule your appointment.  Please call the internal medicine center clinic if you have any questions or concerns, we may be able to help and keep you from a long and expensive emergency room wait. Our clinic and after hours phone number is [phone], the best time to call is Monday through Friday 9 am to 4 pm but there is always someone available 24/7 if you have an emergency. If you need medication refills please notify your pharmacy one week in advance and they will send [place] a request.   Thank you for allowing [place] to be a part of your care!

## 2021-02-18 NOTE — Progress Notes (Signed)
   CC: anxiety  HPI:  [name] is a 55 y.o. with a PMHx listed below presenting for follow up of his anxiety. For details of today's visit and the status of his chronic medical issues please refer to the assessment and plan.   Past Medical History:  Diagnosis Date   Acute pancreatitis    Anxiety    Arthritis    Colonic adenoma    COPD (chronic obstructive pulmonary disease) ([facility])    Depression    Diverticulitis of colon with perforation 08/2010   GERD (gastroesophageal reflux disease)    History of hiatal hernia    Hypertension    Polysubstance abuse ([place])    Renal insufficiency    Sleep apnea    Intolerant of CPAP   TIA (transient ischemic attack)    Review of Systems:   Review of Systems  Constitutional:  Negative for chills and fever.  Cardiovascular:  Negative for chest pain and palpitations.  Gastrointestinal:  Positive for diarrhea. Negative for nausea and vomiting.  Neurological:  Positive for headaches. Negative for dizziness and weakness.  Psychiatric/Behavioral:  Positive for depression. Negative for suicidal ideas. The patient is nervous/anxious.     Physical Exam:  There were no vitals filed for this visit.  Physical Exam Vitals reviewed.  Constitutional:      General: He is not in acute distress.    Appearance: Normal appearance. He is not ill-appearing.  Cardiovascular:     Rate and Rhythm: Normal rate and regular rhythm.     Pulses: Normal pulses.     Heart sounds: Normal heart sounds. No murmur heard.   No gallop.  Pulmonary:     Effort: Pulmonary effort is normal. No respiratory distress.     Breath sounds: Normal breath sounds. No wheezing or rales.  Abdominal:     General: Abdomen is flat. Bowel sounds are normal. There is no distension.     Palpations: Abdomen is soft.     Tenderness: There is no abdominal tenderness.  Musculoskeletal:        General: No swelling or tenderness.     Right lower leg: No edema.     Left lower leg:  No edema.  Skin:    General: Skin is warm and dry.     Findings: Lesion present.     Comments: Subcentimeter cystic-like structure in the left lower abdomen near left groin, firm and tender to palpation, erythematous and with a central blisterlike appearance  Neurological:     Mental Status: He is alert and oriented to person, place, and time.     Motor: No weakness.  Psychiatric:        Mood and Affect: Mood normal.        Behavior: Behavior normal.        Thought Content: Thought content normal.        Judgment: Judgment normal.      Assessment & Plan:   See Encounters Tab for problem based charting.  Patient discussed with Dr.  Jimmye Norman

## 2021-02-18 NOTE — Assessment & Plan Note (Signed)
Patient presents today to follow-up his anxiety and depression.  He was recently started on duloxetine and states that this has made him feel worse and have significant diarrhea.  He is requesting a different medication.  He is continue to use Xanax from his family members.  States he took it this morning and that is why his blood pressure is well controlled.  States that he is still very anxious and depressed due to life stressors, mainly due to his brother being diagnosed with terminal cancer.  He is also having difficulties in his relationships.  He met with Dr. Carolynne Edouard recently and plans to continue this.  States that he has been on paroxetine and Wellbutrin in the past and neither helped his symptoms and if anything he felt worse on these medications.  States on paroxetine he was extremely fatigued and Wellbutrin made his depression worse.  He was on BuSpar for a while and this seemed to help his symptoms however over time this stopped working.  He states that he was on a yellow tablet version of this and that worked however the white tablets did not work for him.  States Xanax works the best for him and Bobbye Charleston is a close second.  Discussed the risk of taking these medications with Suboxone and he expressed understanding.  Discussed at this point he has tried all the different classes of medications in the antidepressant/antianxiolytic realm.  Suggested that we can certainly try another SSRI or SNRI and patient is agreeable to this plan.  Discussed we can also try BuSpar again.  Ultimately patient will also need to follow with psychiatry to help determine best treatment options for his depression and anxiety given he has failed or not tolerated most of the treatments thus far.  Plan: Start Effexor 37.5 mg daily and increase to 75 mg after 1 week Refer to psychiatry due to numerous to failed attempts with various antidepressant/antianxiety medications

## 2021-02-18 NOTE — Assessment & Plan Note (Signed)
Patient presents with a small subcentimeter epidermoid cyst in his left lower abdomen, near his groin.  States that this is been there for a few weeks and is tender to palpation and at times leaks brownish pus.  Denies any fevers chills nausea or vomiting.  Discussed trying to keep this area covered with gauze/bandage to prevent irritation from his pants and belt line.  Also recommended soaking this lesion to help it drain.  If symptoms persist or worsen he may need to see dermatology for possible excision.  For now continue to monitor.

## 2021-02-19 NOTE — Progress Notes (Signed)
Internal Medicine Clinic Attending ° °Case discussed with Dr. Rehman  At the time of the visit.  We reviewed the resident’s history and exam and pertinent patient test results.  I agree with the assessment, diagnosis, and plan of care documented in the resident’s note.  ° °

## 2021-02-20 ENCOUNTER — Other Ambulatory Visit: Payer: Self-pay | Admitting: Internal Medicine

## 2021-02-20 DIAGNOSIS — F419 Anxiety disorder, unspecified: Secondary | ICD-10-CM

## 2021-02-21 NOTE — Addendum Note (Signed)
Addended by: Mike Craze on: 02/21/2021 08:40 AM   Modules accepted: Orders

## 2021-02-21 NOTE — Telephone Encounter (Signed)
   Telephone encounter was:  Successful.  02/21/2021 Name: [name] MRN: [id number] DOB: 01/31/1966  [name] is a 55 y.o. year old male who is a primary care patient of Rehman, Areeg N, DO . The community resource team was consulted for assistance with Transportation Needs , Financial Difficulties related to household bills, and anxiety.  Care guide performed the following interventions: Patient provided with information about care guide support team and interviewed to confirm resource needs Discussed resources to assist with transportation, financial struggles. Pt states he is under stress because his brother is in the hospital and he is trying to pay his as well as his brothers bills . He denies needing transportation or financial assistance but is waiting on a new referral to a psychiatrist in [place] so he doesn't have to come to [place].   Follow Up Plan:  No further follow up planned at this time. The patient has been provided with needed resources.  April Green Care Guide, Embedded Care Coordination Enterprise, Care Management Phone: [phone] Email: [email or web address]

## 2021-02-27 ENCOUNTER — Ambulatory Visit: Payer: 59 | Admitting: Behavioral Health

## 2021-02-27 DIAGNOSIS — F331 Major depressive disorder, recurrent, moderate: Secondary | ICD-10-CM

## 2021-02-27 DIAGNOSIS — F419 Anxiety disorder, unspecified: Secondary | ICD-10-CM

## 2021-02-27 NOTE — BH Specialist Note (Signed)
Integrated Behavioral Health via Telemedicine Visit  02/27/2021 VYNCENT OVERBY [id number]  Number of [place] visits: 2/6 Session Start time: 2:00pm  Session End time: 2:30pm Total time: 30  Referring Provider: Dr. Harlow Ohms, DO Patient/Family location: Pt is in his truck getting ready to visit his younger [place] in [place] [facility] Provider location: [facility] Office All persons participating in visit: Pt & Clinician Types of Service: Individual psychotherapy  I connected with [name] and/or Kathrynn Running Frangos's  self  via  Telephone or Video Enabled Telemedicine Application  (Video is Caregility application) and verified that I am speaking with the correct person using two identifiers. Discussed confidentiality:  n/a  I discussed the limitations of telemedicine and the availability of in person appointments.  Discussed there is a possibility of technology failure and discussed alternative modes of communication if that failure occurs.  I discussed that engaging in this telemedicine visit, they consent to the provision of behavioral healthcare and the services will be billed under their insurance.  Patient and/or legal guardian expressed understanding and consented to Telemedicine visit: No   Presenting Concerns: Patient and/or family reports the following symptoms/concerns: Pt has multiple issues today invl'g concern for handling his [name]' affairs since his recent hosp'tzn for cancer Duration of problem: months; Severity of problem: moderate  Patient and/or Family's Strengths/Protective Factors: Social connections and Sense of purpose  Goals Addressed: Patient will:  Reduce symptoms of: anxiety, depression, and stress   Increase knowledge and/or ability of: coping skills, healthy habits, self-management skills, stress reduction, and psychoedu re: assisting his [name] with his affairs    Demonstrate ability to: Increase healthy adjustment to current life  circumstances, Increase adequate support systems for patient/family, and Improve medication compliance. Pt is having difficulty w/the stressors invld w/caregiving-promoted psychoedu & SW resources  Progress towards Goals: Ongoing  Interventions: Interventions utilized:  Veterinary surgeon, Supportive Counseling, and Psychoeducation and/or Health Education Standardized Assessments completed:  screeeners prn  Patient and/or Family Response: Pt receptive to call & also needing to be brief as he is visiting his [name] & needs to request a SW Case Mngr today.  Assessment: Patient currently experiencing anx over managing his own & now his [name]' health issues & life affairs. This adjustment cont's for Pt as he navigates his [name]' Tx for cancer & his bills.  Patient may benefit from Psychiatric care to estb medication regime.  Plan: Follow up with behavioral health clinician on : 2-3 wks for 30 min ck-in on telehealth Behavioral recommendations: None @ this time Referral(s): [facility] (In Clinic)  I discussed the assessment and treatment plan with the patient and/or parent/guardian. They were provided an opportunity to ask questions and all were answered. They agreed with the plan and demonstrated an understanding of the instructions.   They were advised to call back or seek an in-person evaluation if the symptoms worsen or if the condition fails to improve as anticipated.  Donnetta Hutching, LMFT

## 2021-03-14 ENCOUNTER — Telehealth: Payer: Self-pay

## 2021-03-14 NOTE — Telephone Encounter (Signed)
Can Pt make it until next week-I can call him. I have heard much good about his [name] who passed.

## 2021-03-14 NOTE — Telephone Encounter (Addendum)
Patient has an appt with an Intern on 7/11 at [facility]. Dr. Theodis Shove has a 0930 opening that same day. Patient has been notified and is appreciative.

## 2021-03-14 NOTE — Telephone Encounter (Signed)
Requesting to speak with a nurse about getting anxiety medication, states his brother passed away yesterday. Please call pt back.

## 2021-03-14 NOTE — Telephone Encounter (Signed)
Perfect, thank you

## 2021-03-14 NOTE — Telephone Encounter (Signed)
RTC, patient states his brother passed away and he needs some antianxiety medicine bad.  He states Xanax has worked the best for him in the past, but "the doctors won't give him any of this anymore.  I've been taking some from my family, but they don't have anymore".  No opening's for telehealth appt with any team today.  His LOV was 02/18/21 where anxiety was discussed w/ MD.  He is also seeing Dr. Theodis Shove.  Will forward to [facility] team and Dr. Theodis Shove to advise.

## 2021-03-14 NOTE — Telephone Encounter (Signed)
I would recommend a follow up appointment with Dr. Theodis Shove and with our team to discuss managing his anxiety ASAP. Preferably these appointments would happen in the same day that way a discussion can be had between Dr. Theodis Shove and whichever provider is seeing the patient.  Thanks!

## 2021-03-18 ENCOUNTER — Ambulatory Visit: Payer: 59 | Admitting: Behavioral Health

## 2021-03-18 ENCOUNTER — Ambulatory Visit (INDEPENDENT_AMBULATORY_CARE_PROVIDER_SITE_OTHER): Payer: 59 | Admitting: Internal Medicine

## 2021-03-18 DIAGNOSIS — F419 Anxiety disorder, unspecified: Secondary | ICD-10-CM | POA: Diagnosis not present

## 2021-03-18 DIAGNOSIS — F4321 Adjustment disorder with depressed mood: Secondary | ICD-10-CM

## 2021-03-18 DIAGNOSIS — G8929 Other chronic pain: Secondary | ICD-10-CM | POA: Diagnosis not present

## 2021-03-18 DIAGNOSIS — M25562 Pain in left knee: Secondary | ICD-10-CM | POA: Diagnosis not present

## 2021-03-18 DIAGNOSIS — I10 Essential (primary) hypertension: Secondary | ICD-10-CM | POA: Diagnosis not present

## 2021-03-18 DIAGNOSIS — F331 Major depressive disorder, recurrent, moderate: Secondary | ICD-10-CM

## 2021-03-18 MED ORDER — DICLOFENAC SODIUM 1 % EX GEL: 2.0000 g | Freq: Four times a day (QID) | CUTANEOUS | 1 refills | Status: DC

## 2021-03-18 MED ORDER — HYDROXYZINE HCL 10 MG PO TABS: 10.0000 mg | ORAL_TABLET | Freq: Three times a day (TID) | ORAL | 0 refills | Status: AC | PRN

## 2021-03-18 NOTE — Assessment & Plan Note (Signed)
Patient reports that he is still dealing with chronic left knee pain as discussed at his last visit. Denies any injury to the knee, but states that it gets swollen at night and it makes a popping noise on occasion. The patient is still able to ambulate and this pain does not get in the way of his ADLs.   Plan: - Voltaren gel to affected area PRN

## 2021-03-18 NOTE — Assessment & Plan Note (Signed)
Patient presents today to follow-up regarding worsening anxiety and depression. The patient states that his brother passed away last week and he has been struggling with anxiety since. He has not been sleeping at night and notes that he has very little appetite. Patient does not have much interest in doing activities that he previously enjoyed and also has had no energy to leave his house. His brother was his closest family member to him and he states that he now he has no one. Patient does note he has an aunt that lives in the area and she frequently checks in on him. He denies any suicidal or homicidal thoughts at this time. He is requesting Xanax, as he states this is the only medication that helps him with anxiety. He previously was getting Xanax from his aunt, but she has run out of her own rx, and thus, the patient would like [place] to write for it. Discussed with the patient that taking his aunt's Xanax is in violation of his Suboxone contract and the risks of taking these medications together. Patient expressed understanding. Additionally, he was previously started on Effexor at his last visit to the [facility], however, the patient states that he stopped taking this because it was making him confused and forgetful. Ultimately, we discussed that he needs to follow up with psychiatry to determine the best treatment plan for him, given he has failed many different anxiety and depression medications. Patient was referred to psych at his last visit, however, he states that he was never called for an appt. Until the patient can be seen, he was given an Rx for Atarax 10mg  to take q8h PRN for anxiety. Discussed side effects of the medication and made aware it can cause the patient to be sleepy. Discussed not operating any heavy machinery while on Atarax and patient expressed understanding. Also recommended grief counseling to the patient, however, he declined as he states he will not be able to attend these sessions with his  work.   Plan: - Atarax 10mg  q8h PRN for anxiety x30 days - Gave patient # for Behavioral Health at [place], advised to call to make an appt ASAP

## 2021-03-18 NOTE — Patient Instructions (Signed)
Thank you, [name] for allowing [place] to provide your care today. Today we discussed anxiety and your knee pain.  Anxiety: We will confirm your referral to psych went through and will make sure you get an appt with them as soon as possible. I sent in Atarax for you to take up to 3 times a day as needed for anxiety.  As discussed, this medication can make you sleepy so be sure not to operate any heavy machinery while taking it.  Knee pain/swelling: Sent in Voltaren gel for you to apply to affected area as needed for the pain.  If this does not help we can follow-up and can discuss other options in the future.  I have ordered the following labs for you:  Lab Orders  No laboratory test(s) ordered today     Tests ordered today:  None  Referrals ordered today:   Referral Orders  No referral(s) requested today     I have ordered the following medication/changed the following medications:   Stop the following medications: There are no discontinued medications.   Start the following medications: Meds ordered this encounter  Medications   hydrOXYzine (ATARAX/VISTARIL) 10 MG tablet    Sig: Take 1 tablet (10 mg total) by mouth 3 (three) times daily as needed.    Dispense:  90 tablet    Refill:  0   diclofenac Sodium (VOLTAREN) 1 % GEL    Sig: Apply 2 g topically 4 (four) times daily.    Dispense:  2 g    Refill:  1     Follow up: 2-3 months    Remember: To follow up with psychiatry.   Should you have any questions or concerns please call the internal medicine clinic at [phone].    Buddy Duty, D.O. [place]

## 2021-03-18 NOTE — BH Specialist Note (Signed)
Integrated Behavioral Health Follow Up In-Person Visit  MRN: [id number] Name: [name]  Number of [place] Clinician visits: 3/6 Session Start time: 9:30am  Session End time: 9:45am Total time: 15 minutes consult  Types of Service: Check-in for grief normalization/validation  Interpretor:No. Interpretor Name and Language: n/a   Subjective: [name] is a 55 y.o. male accompanied by  self Patient was referred by Dr. Harlow Ohms, DO for grief & Recovery Support/Relapse Prevention. Patient reports the following symptoms/concerns: elevated anx/dep due to recent death of [name] in Hospice last week. Pt was w/his [name] on the day he died. This is Pt's only Sibling & he is struggling to pay for a funeral, casket, & the plot. Pt thinks he may have to cremate his [name]. Duration of problem: one week; Severity of problem: moderate  Objective: Mood: Anxious, Depressed, and grieving while trying to manage his [name]'s affairs  and Affect: Depressed & sad Risk of harm to self or others: No plan to harm self or others  Life Context: Family and Social: Pt has limited Family in the area. He lives in [place] & need the contact for the psychiatric contact provided by Referral from Dr. Laural Golden. Dr. Raymondo Band is seeing Pt today & is connecting him to resources. Pt does not have a big social life component. 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 School/Work: Pt does not attend school. Pt works PT-FT. Self-Care: Pt is here today requesting medication for anxiety. He can no longer secure Xanax which was assisting him. Related to Pt he cannot use Xanax w/the Suboxone script per [facility] contract. Physician is prescribing Hydroxyzine instead to help calm his nerves. Life Changes: Recent death of [name]  Patient and/or Family's Strengths/Protective Factors: Pt has been adjusting to his recent situation  as caregiver for his [name].  His [name] died last week in Hospice care.  Goals Addressed: Patient will:  Reduce symptoms of: anxiety, depression, and stress   Increase knowledge and/or ability of: coping skills, healthy habits, self-management skills, and stress reduction   Demonstrate ability to: Increase healthy adjustment to current life circumstances and Increase adequate support systems for patient/family-informed Pt of possibility that [place] can cover some of the costs of his [name]'s funeral.  Progress towards Goals: Ongoing  Interventions: Interventions utilized:  Behavioral Activation Standardized Assessments completed:  screeners prn  Patient and/or Family Response: Pt receptive to brief visit today & requests another appt  Patient Centered Plan: Patient is on the following Treatment Plan(s): Pt will maintain his Recovery Plan & try to supplement w/AA or NA Mtg Assessment: Patient currently experiencing elevated anx/dep & sadness due to [name]' death.   Patient may benefit from cont'd sessions to reduce anx/dep & assist in coping. Pt has lost both Parents in the past & is not new to grief. Normalized/validated Pt's unique grief response.   Plan: Follow up with behavioral health clinician on : 2-3 wks, f:f for 60 min Behavioral recommendations: Care for self & know the work on his [name]' affairs will be completed by him in due time. Referral(s): [place] (In Clinic) & Pt again given Referral to [facility] w/phone # for him to f/u. "From scale of 1-10, how likely are you to follow plan?": [place], LMFT

## 2021-03-18 NOTE — Progress Notes (Signed)
   CC: anxiety  HPI:  [name] F Baird is a 55 y.o. male with a PMHx of anxiety, depression, hypertension, polysubstance abuse on Suboxone.  He is presenting to the clinic for worsening anxiety as his brother passed away recently on 12/06/22. Patient states that since then, he has not had any appetite and has not been sleeping at night. He has very little interest in doing activities that he previously enjoyed and also has not had much energy to do anything. Patient states that he used to work with his brother everyday and has been taking it hard since his passing. He has an aunt who checks in on him occasionally, but other than that, does not have much family. Patient requesting xanax for his anxiety and states that he usually gets xanax from his aunt. States that this is the only medication that helps him with anxiety. Patient denies any suicidal or homicidal thoughts at this time.   Past Medical History:  Diagnosis Date   Acute pancreatitis    Anxiety    Arthritis    Colonic adenoma    COPD (chronic obstructive pulmonary disease) (HCC)    Depression    Diverticulitis of colon with perforation 08/2010   GERD (gastroesophageal reflux disease)    History of hiatal hernia    Hypertension    Polysubstance abuse ([place])    Renal insufficiency    Sleep apnea    Intolerant of CPAP   TIA (transient ischemic attack)    Review of Systems:  Review of Systems  Constitutional:  Negative for chills and fever.  Eyes:  Negative for blurred vision and pain.  Respiratory:  Negative for cough and shortness of breath.   Cardiovascular:  Positive for leg swelling. Negative for chest pain and palpitations.  Gastrointestinal:  Negative for diarrhea, nausea and vomiting.  Genitourinary:  Negative for dysuria.  Musculoskeletal:  Negative for back pain and myalgias.  Neurological:  Negative for dizziness and headaches.  Psychiatric/Behavioral:  Positive for depression. Negative for memory loss and  suicidal ideas. The patient is nervous/anxious and has insomnia.     Physical Exam:  Vitals:   03/18/21 0900  BP: (!) 147/91  Pulse: 71  Temp: (!) 97 F (36.1 C)  TempSrc: Oral  SpO2: 96%  Weight: 207 lb 8 oz (94.1 kg)  Height: 5\' 7"  (1.702 m)   General: Middle-age male with flat affect. No acute distress.  Head: Normocephalic. Atraumatic. CV: RRR. No murmurs, rubs, or gallops. No LE edema Pulmonary: Lungs CTAB. Normal effort. No wheezing or rales. Abdominal: Soft, nontender, nondistended. Normal bowel sounds. Extremities: Palpable pulses. Normal ROM. 1+ bilateral lower extremity edema, tenderness to palpation in L knee.  Skin: Warm and dry. No obvious rash or lesions. Neuro: A&Ox3. Moves all extremities. Normal sensation. No focal deficit. Psych: Depressed mood and flat affect.   Assessment & Plan:   See Encounters Tab for problem based charting.  Patient seen with Dr. Dareen Piano

## 2021-03-18 NOTE — Assessment & Plan Note (Signed)
BP elevated today at 147/91, despite previously being well controlled. Patient reports he is adherent with BP meds. Believe his elevated BP today is due to his anxiety and recent life stressors. Will continue to monitor closely.  Plan: - Continue current regimen - Continue to monitor BP closely

## 2021-03-19 ENCOUNTER — Telehealth: Payer: Self-pay | Admitting: Behavioral Health

## 2021-03-19 NOTE — Telephone Encounter (Signed)
RTC to Pt re: medications & Referral to [facility].  Pt related he has taken Vistiril in the past, but he will try it again.  Pt reports this Referral is not accepting NuPts @ this time. Forwarded Pt another Referral to [employer] w/instructions for site. Pt sts he will do his best to get EvalAssess done soon.  Pt has secured funding for his [name]' Funeral.  Dr. Theodis Shove

## 2021-03-21 ENCOUNTER — Other Ambulatory Visit: Payer: Self-pay

## 2021-03-21 ENCOUNTER — Encounter: Payer: Self-pay | Admitting: Gastroenterology

## 2021-03-21 ENCOUNTER — Telehealth: Payer: Self-pay | Admitting: *Deleted

## 2021-03-21 ENCOUNTER — Ambulatory Visit (INDEPENDENT_AMBULATORY_CARE_PROVIDER_SITE_OTHER): Payer: 59 | Admitting: Gastroenterology

## 2021-03-21 VITALS — BP 132/88 | HR 69 | Temp 97.3°F | Ht 67.0 in | Wt 204.8 lb

## 2021-03-21 DIAGNOSIS — R1319 Other dysphagia: Secondary | ICD-10-CM

## 2021-03-21 DIAGNOSIS — K649 Unspecified hemorrhoids: Secondary | ICD-10-CM

## 2021-03-21 MED ORDER — HYDROCORTISONE (PERIANAL) 2.5 % EX CREA: 1.0000 "application " | TOPICAL_CREAM | Freq: Two times a day (BID) | CUTANEOUS | 1 refills | Status: DC

## 2021-03-21 NOTE — Telephone Encounter (Signed)
BPE scheduled for 7/20 at 10:00am, arrival 9:45am, npo midnight.  Called pt and is aware of appt details.

## 2021-03-21 NOTE — Patient Instructions (Addendum)
We will schedule barium pill swallow evaluation for your swallowing issues.  Will send Anusol cream for hemorrhoids to [facility], if this does not work, please let us know and we can arrange for hemorrhoid banding.   Follow up 6 months.

## 2021-03-21 NOTE — Progress Notes (Signed)
Referring Provider: Mike Craze DO Primary Care Physician:  Mike Craze DO Primary GI Physician: [place]  Chief Complaint  Patient presents with   Dysphagia    Getting worse. Feels like he needs dilation again   Hemorrhoids    Unable to afford compounded cream   HPI:   [name] is a 55 y.o. male presenting today for recurrent dysphagia and hemorrhoids. Patient has significant history of colectomy with end colostomy (later reversed) anal fissure, acute pancreatitis, chronic abdominal pain, diverticulitis with perforation, GERD, Hiatal hernia, HTN, sleep apnea, and polysubstance abuse.   Dysphagia: esophageal stricture requiring dilation previously in 2019. Most recent EGD in feb 2021 without evidence of stricture, however, patient does have history of dysphagia in the past as well. States that he has issues with food going down almost on a daily basis, reports it does not really depend on what he is eating. Denies any issues swallowing liquids or pills. He reports that he can normally get food to pass with drinking something.   Hemorrhoids: known internal hemorrhoids as seen on colonoscopy in 2019. previously using a compound cream from [employer] but can no longer afford this as it is $50. He reports some pain and itching with bowel movements, sometimes has some bleeding when he wipes and occasionally feels some tissue protruding in rectal area, no melena or blood coated stools. Reports that his BMs are fairly regular, usually has one per day. States he occasional takes a stool softener if needed.    Last Colonoscopy:12/14/17 Two 4 to 6 mm polyps in the cecum(tubular adenoma and Non-bleeding internal hemorrhoids. Single rectal polyp removed (tubular adenoma/benign colonic mucosa). The examination was otherwise normal on direct and retroflexion views.  Last Endoscopy:10/13/19 Abnormal distal esophagus may be normal variant?status post biopsy (mucosa with mild inflammation  consistent with reflux). Normal stomach. Normal duodenal bulb and second portion of the duodenum. Neg Barrett's or H. Pylori.  Recommendations:  Repeat surveillance colonoscopy in 2024  BPE for dysphagia to r/o esophageal motility disorder  Past Medical History:  Diagnosis Date   Acute pancreatitis    Anxiety    Arthritis    Colonic adenoma    COPD (chronic obstructive pulmonary disease) (HCC)    Depression    Diverticulitis of colon with perforation 08/2010   GERD (gastroesophageal reflux disease)    History of hiatal hernia    Hypertension    Polysubstance abuse (HCC)    Renal insufficiency    Sleep apnea    Intolerant of CPAP   TIA (transient ischemic attack)     Past Surgical History:  Procedure Laterality Date   APPENDECTOMY     BIOPSY  12/14/2017   Procedure: BIOPSY;  Surgeon: Daneil Dolin, MD;  [facility];  Service: Endoscopy;;  gastric    BIOPSY  10/13/2019   Procedure: BIOPSY;  Surgeon: Daneil Dolin, MD;  [facility];  Service: Endoscopy;;  esophagus   clamp left in colon after abd surgery     COLON SURGERY  05/2011   [facility]. exp laparotomy with colostomy takedown, lysis of adhesions (3hours), loop ileostomy. complicated by abscess formation requiring percutaneous drainage   colonoscopy via stoma with endoscopy of [name]  04/2011   [facility]. two sessile polyps in trv colon. complete resection but only partial retrieval. congested mucosa in area 3cm proximal to stoma, erythematous mucosa in Hartmann pouch. repeat tcs 8/2017path-adenomatous polyp   COLONOSCOPY WITH PROPOFOL N/A 12/14/2017  Dr. Gala Romney: 2 4 to 6 mm polyps in the cecum, tubular adenomas, single rectal polyp tubular adenoma, nonbleeding internal hemorrhoids.  Appears to be a surgical clip at the appendiceal orifice.  Next colonoscopy in 5 years   ESOPHAGOGASTRODUODENOSCOPY  03/05/2012   erosive reflux esophagitis, bulbar erosions, gastric erosions/petichae, bx showed gastritis,  no H.Pylori. Procedure: ESOPHAGOGASTRODUODENOSCOPY (EGD);  Surgeon: Daneil Dolin, MD;  [facility];  Service: Endoscopy;  Laterality: N/A;  11:30   ESOPHAGOGASTRODUODENOSCOPY (EGD) WITH PROPOFOL N/A 12/14/2017   Dr. Gala Romney: Erosive reflux esophagitis with questionable mild stricturing/non-obstruction at this level status post dilation.  multiple gastric erosions in the stomach biopsy showed reactive gastropathy but no H. pylori.   ESOPHAGOGASTRODUODENOSCOPY (EGD) WITH PROPOFOL N/A 10/13/2019   negative Barrett's, normal stomach   HERNIA REPAIR     KNEE SURGERY Right 2006   MALONEY DILATION N/A 12/14/2017   Procedure: Venia Minks DILATION;  Surgeon: Daneil Dolin, MD;  [facility];  Service: Endoscopy;  Laterality: N/A;   MULTIPLE EXTRACTIONS WITH ALVEOLOPLASTY  02/23/2012   Procedure: MULTIPLE EXTRACION WITH ALVEOLOPLASTY;  Surgeon: Gae Bon, DDS;  Location: [facility];  Service: Oral Surgery;  Laterality: Bilateral;  Removal of left mandibular torus, multiple extractions with alveoloplasty   POLYPECTOMY  12/14/2017   Procedure: POLYPECTOMY;  Surgeon: Daneil Dolin, MD;  [facility];  Service: Endoscopy;;   reversal of colostomy  10/2011   per patient, did not have report   right knee surgery     sigmoid colectomy with end colostomy, Hartmann's pouch  08/2010   [facility]. complicated diverticulitis   UMBILICAL HERNIA REPAIR      Current Outpatient Medications  Medication Sig Dispense Refill   albuterol (PROVENTIL HFA;VENTOLIN HFA) 108 (90 Base) MCG/ACT inhaler Inhale 2 puffs into the lungs every 6 (six) hours as needed for wheezing or shortness of breath.     aspirin EC 81 MG EC tablet Take 1 tablet (81 mg total) by mouth daily. Swallow whole. 30 tablet 11   Buprenorphine HCl-Naloxone HCl (SUBOXONE) 8-2 MG FILM Place 1 Film under the tongue in the morning and at bedtime. 60 each 1   Cholecalciferol 25 MCG (1000 UT) TBDP Take 25 mcg by mouth daily. 60 tablet 0    cloNIDine (CATAPRES) 0.1 MG tablet Take 2 tablets (0.2 mg total) by mouth 2 (two) times daily. (Patient taking differently: Take 0.2 mg by mouth daily.) 120 tablet 2   diclofenac Sodium (VOLTAREN) 1 % GEL Apply 2 g topically 4 (four) times daily. 2 g 1   ezetimibe (ZETIA) 10 MG tablet Take 1 tablet (10 mg total) by mouth daily. 30 tablet 11   gabapentin (NEURONTIN) 300 MG capsule Take 1 capsule (300 mg total) by mouth 3 (three) times daily. 90 capsule 2   hydrochlorothiazide (HYDRODIURIL) 25 MG tablet Take 1 tablet (25 mg total) by mouth daily. 30 tablet 2   hydrocortisone (ANUSOL-HC) 2.5 % rectal cream Place 1 application rectally 2 (two) times daily. 30 g 1   hydrOXYzine (ATARAX/VISTARIL) 10 MG tablet Take 1 tablet (10 mg total) by mouth 3 (three) times daily as needed. 90 tablet 0   pantoprazole (PROTONIX) 40 MG tablet Take 1 tablet (40 mg total) by mouth daily. 90 tablet 1   sildenafil (VIAGRA) 50 MG tablet Take 1 tablet (50 mg total) by mouth as needed for erectile dysfunction. 20 tablet 1   No current facility-administered medications for this visit.    Allergies as of  03/21/2021 - Review Complete 03/21/2021  Allergen Reaction Noted   Codeine Itching and Nausea And Vomiting    Latex Other (See Comments) 03/09/2012   Lisinopril  05/27/2017   Morphine Hives    Adhesive [tape] Rash 03/08/2012    Family History  Problem Relation Age of Onset   Stomach cancer Father    Lung cancer Father    Throat cancer Father    Cancer Father        lung and throat cancer   Hypertension Father    Liver disease Cousin        cirrhosis, etoh   Throat cancer Paternal Uncle    Lung cancer Paternal Uncle    Colon cancer Cousin 52   Heart disease Mother    Hypertension Mother    Colon cancer Maternal Uncle    Throat cancer Paternal Uncle    Lung cancer Paternal Uncle    Throat cancer Paternal Uncle    Lung cancer Paternal Uncle     Social History   Socioeconomic History   Marital status:  Single    Spouse name: Not on file   Number of children: 0   Years of education: Not on file   Highest education level: Not on file  Occupational History   Not on file  Tobacco Use   Smoking status: Every Day    Packs/day: 1.00    Years: 37.00    Pack years: 37.00    Types: Cigarettes   Smokeless tobacco: Former    Types: Snuff   Tobacco comments:    dips some trying to quit smoking less than a pack a day   Vaping Use   Vaping Use: Never used  Substance and Sexual Activity   Alcohol use: Not Currently   Drug use: Yes    Frequency: 7.0 times per week    Types: Marijuana   Sexual activity: Not on file  Other Topics Concern   Not on file  Social History Narrative   Not on file   Social Determinants of Health   Financial Resource Strain: Not on file  Food Insecurity: Not on file  Transportation Needs: Not on file  Physical Activity: Not on file  Stress: Not on file  Social Connections: Not on file    Review of Systems: Gen: Denies fever, chills, anorexia. Denies fatigue, weakness, weight loss.  CV: Denies chest pain, palpitations, syncope, peripheral edema, and claudication. Resp: Denies dyspnea at rest, cough, wheezing, coughing up blood, and pleurisy. GI: Denies vomiting blood, jaundice, and fecal incontinence. Denies  odynophagia. Endorses dysphagia. Derm: Denies rash, itching, dry skin Psych: Denies depression, anxiety, memory loss, confusion. No homicidal or suicidal ideation.  Heme: Denies bruising, bleeding, and enlarged lymph nodes.  Physical Exam: BP 132/88   Pulse 69   Temp (!) 97.3 F (36.3 C) (Temporal)   Ht 5\' 7"  (1.702 m)   Wt 204 lb 12.8 oz (92.9 kg)   BMI 32.08 kg/m  General:   Alert and oriented. No distress noted. Pleasant and cooperative.  Heart: Normal rate and rhythm, s1 and s2 heart sounds present.  Lungs: Clear lung sounds in all lobes. Respirations equal and unlabored. Abdomen:  +BS, soft, non-distended, generalized tenderness (this is  chronic per patient from multiple abd surgeries/mesh placement). No rebound or guarding. No HSM or masses noted. Derm: No palmar erythema or jaundice Msk:  Symmetrical without gross deformities. Normal posture. Extremities:  Without edema. Neurologic:  Alert and  oriented x4 Psych:  Alert and  cooperative. Normal mood and affect.  ASSESSMENT: [name] is a 55 y.o. male presenting today for recurrent dysphagia and hemorrhoids. Patient has significant history of history of colectomy with end colostomy (later reversed)anal fissure, acute pancreatitis, diverticulitis, GERD, Hiatal hernia, HTN, sleep apnea, and polysubstance abuse.   Dysphagia occurring almost daily with any type of food, not with liquid or pills. He reports that he can normally get food to pass with drinking liquids. Has had esophageal stricture s/p dilation in the past, however, most recent EGD in 2021 did not reveal stricture. Query esophageal motility disorder and discussed evaluation of motility disorder as cause of dysphagia with Barium pill swallow evaluation which patient was amenable to. Can further assess with repeat EGD if BPE is unremarkable.   Longstanding history of hemorrhoids.Known internal hemorrhoids as seen on colonoscopy in 2019. previously using a compound cream from [employer] but can no longer afford this as it is $50. He reports some pain and itching with bowel movements, sometimes has some bleeding when he wipes and occasionally feels some tissue protruding in rectal area, no melena or blood coated stools. Denies constipation. Was evaluated for hemorrhoid banding in the past but due to anal fissure was not a candidate, fissure then resolved and hemorrhoids improved thereafter so banding was not indicated at that time. Will try anusol cream, if this does not work, we can consider hemorrhoid banding.    No red flag symptoms. Patient denies melena, hematochezia, nausea, vomiting, diarrhea, constipation,  odyonophagia, early satiety or weight loss.    PLAN:  Will order Barium Pill swallow evaluation 2. rx anusol cream 3. Consider hemorrhoid banding if topical cream is not helpful  Follow Up: 6 months  Chelsea L. Alver Sorrow, MSN, APRN, AGNP-C Adult-Gerontology Nurse Practitioner [facility]   Addendum: patient seen in conjunction with Scherrie Gerlach, MSN, APRN, AGNP-C.  Annitta Needs, PhD, ANP-BC [facility]

## 2021-03-24 ENCOUNTER — Other Ambulatory Visit: Payer: Self-pay | Admitting: Internal Medicine

## 2021-03-24 DIAGNOSIS — I10 Essential (primary) hypertension: Secondary | ICD-10-CM

## 2021-03-25 NOTE — Progress Notes (Signed)
Internal Medicine Clinic Attending ° °I saw and evaluated the patient.  I personally confirmed the key portions of the history and exam documented by Dr. Atway and I reviewed pertinent patient test results.  The assessment, diagnosis, and plan were formulated together and I agree with the documentation in the resident’s note.  °

## 2021-03-26 ENCOUNTER — Ambulatory Visit: Payer: 59 | Admitting: Behavioral Health

## 2021-03-27 ENCOUNTER — Ambulatory Visit (HOSPITAL_COMMUNITY): Admission: RE | Admit: 2021-03-27 | Payer: 59 | Source: Ambulatory Visit

## 2021-04-02 ENCOUNTER — Other Ambulatory Visit: Payer: Self-pay

## 2021-04-02 ENCOUNTER — Ambulatory Visit (HOSPITAL_COMMUNITY)
Admission: RE | Admit: 2021-04-02 | Discharge: 2021-04-02 | Disposition: A | Discharge status: Home / Self Care | Payer: 59 | Source: Ambulatory Visit | Attending: Gastroenterology | Admitting: Gastroenterology

## 2021-04-02 DIAGNOSIS — R1319 Other dysphagia: Secondary | ICD-10-CM | POA: Insufficient documentation

## 2021-04-05 ENCOUNTER — Telehealth: Payer: Self-pay | Admitting: *Deleted

## 2021-04-05 DIAGNOSIS — R1319 Other dysphagia: Secondary | ICD-10-CM

## 2021-04-05 NOTE — Telephone Encounter (Signed)
Called pt. He has been scheduled for EGD/DIL with Dr. Gala Romney, propofol, ASA 2 for 9/12 at 3:00pm. Aware will mail instructions.

## 2021-04-09 ENCOUNTER — Other Ambulatory Visit: Payer: Self-pay

## 2021-04-09 ENCOUNTER — Ambulatory Visit (INDEPENDENT_AMBULATORY_CARE_PROVIDER_SITE_OTHER): Payer: 59 | Admitting: Internal Medicine

## 2021-04-09 ENCOUNTER — Encounter: Payer: Self-pay | Admitting: Internal Medicine

## 2021-04-09 VITALS — BP 121/84 | HR 77 | Temp 98.0°F | Ht 66.0 in | Wt 200.3 lb

## 2021-04-09 DIAGNOSIS — F331 Major depressive disorder, recurrent, moderate: Secondary | ICD-10-CM

## 2021-04-09 DIAGNOSIS — R42 Dizziness and giddiness: Secondary | ICD-10-CM

## 2021-04-09 DIAGNOSIS — N521 Erectile dysfunction due to diseases classified elsewhere: Secondary | ICD-10-CM | POA: Diagnosis not present

## 2021-04-09 DIAGNOSIS — M7989 Other specified soft tissue disorders: Secondary | ICD-10-CM | POA: Insufficient documentation

## 2021-04-09 DIAGNOSIS — F1110 Opioid abuse, uncomplicated: Secondary | ICD-10-CM | POA: Diagnosis not present

## 2021-04-09 DIAGNOSIS — I10 Essential (primary) hypertension: Secondary | ICD-10-CM

## 2021-04-09 MED ORDER — TRAZODONE HCL 50 MG PO TABS: 50.0000 mg | ORAL_TABLET | Freq: Every day | ORAL | 2 refills | Status: DC

## 2021-04-09 MED ORDER — SILDENAFIL CITRATE 100 MG PO TABS: 100.0000 mg | ORAL_TABLET | ORAL | 1 refills | Status: DC | PRN

## 2021-04-09 NOTE — Patient Instructions (Signed)
Thank you, [name] for allowing [place] to provide your care today. Today we discussed your depression, blood pressure, opioid use disorder, hand swelling, and dizziness.  Depression - We have sent in Trazodone 39m to help you sleep. If you take it at night and feel like it is too strong the next day, you can cut the pill in half and take 253m You have an appointment scheduled with Dr. WiTheodis Shoven 8/4 at 9 AM. We will follow up with our coordinator about your psychiatry referral.  Blood Pressure - Please continue taking HCTZ 2561mnd Clonidine 0.2mg67mOpioid Use Disorder - We have sent in a new prescription for your Suboxone. Please try not to use Xanax with your Suboxone.  Hand Swelling - We got blood work today to figure out what may be causing the pain and swelling in your hands, and we have placed an order for you to get X-rays. We will call you if your results are abnormal.  Dizziness - We checked your blood pressure while you were sitting and standing today, which was normal. Try to stay hydrated while you are out in the heat.   I have ordered the following labs for you:  Lab Orders  Sed Rate (ESR)  ANA, IFA (with reflex)  Anti-CCP Ab, IgG + IgA (RDL)  Rheumatoid (RA) Factor  CBC no Diff  BMP8+Anion Gap      I have ordered the following medication/changed the following medications:    Start the following medications: Meds ordered this encounter  Medications   traZODone (DESYREL) 50 MG tablet    Sig: Take 1 tablet (50 mg total) by mouth at bedtime.    Dispense:  30 tablet    Refill:  2   sildenafil (VIAGRA) 100 MG tablet    Sig: Take 1 tablet (100 mg total) by mouth as needed for erectile dysfunction.    Dispense:  20 tablet    Refill:  1     Follow up: 2 months in person, Telehealth visit in 1 month to check in on mood and sleep    Should you have any questions or concerns please call the internal medicine clinic at [phone] Areeg Rehman, DO  [facility]

## 2021-04-09 NOTE — Progress Notes (Deleted)
   CC: ***  HPI:  [id number] [name] is a 55 y.o.   Past Medical History:  Diagnosis Date   Acute pancreatitis    Anxiety    Arthritis    Colonic adenoma    COPD (chronic obstructive pulmonary disease) (HCC)    Depression    Diverticulitis of colon with perforation 08/2010   GERD (gastroesophageal reflux disease)    History of hiatal hernia    Hypertension    Polysubstance abuse ([place])    Renal insufficiency    Sleep apnea    Intolerant of CPAP   TIA (transient ischemic attack)    Review of Systems:  ***  Physical Exam:  There were no vitals filed for this visit. ***  Assessment & Plan:   See Encounters Tab for problem based charting.  Patient {GC/GE:[id number]::"discussed with","seen with"} Dr. {NAMES:[id number]::"Guilloud","Hoffman","Mullen","Narendra","Williams","Vincent"}

## 2021-04-09 NOTE — Assessment & Plan Note (Signed)
PHQ-9 of 16 today. Patient's brother whom he was very close with passed away from cancer on 29-Mar-2023. He continues to endorse symptoms of depression which include feelings of sadness and guilt, difficulty sleeping, anhedonia, trouble concentrating, and passive thoughts of suicide. He denies current SI. He lists his cousin as a source of support and has been seeing Dr. Theodis Shove for therapy. He was prescribed Atarax at his last appointment and states it helped some, but after a week of taking it began to "make my head feel funny." He has been taking Xanax obtained from a friend for the last 4 days for his mood. He contacted [facility] in [place] but they are not taking new patients, and is unable to be seen by [facility] because he resides in [place] and he is unable to travel to [place] often due to distance, having to miss work, and lack of transportation. We discussed initiating Trazodone today to help with his difficulty sleeping, which he is amenable to trying. We considered Wellbutrin which would also help with tobacco use cessation, but on chart review he was noted to have failed this medication in the past. He would greatly benefit from medication management by a psychiatrist since he has failed multiple medications, including Duloxetine, Venlafaxine, Buspar, Atarax, and Wellbutrin.   Plan: -Start Trazodone '50mg'$  for sleep difficulty  -Continue to meet with Dr. Theodis Shove for grief counseling, next appt 8/4 via telehealth  -We will follow-up on the status of his psychiatry referral -Repeat PHQ-9 at next visit  -Follow-up via telehealth in 1 month

## 2021-04-09 NOTE — Assessment & Plan Note (Signed)
Patient states that Viagra has been helpful for his ED at '100mg'$ , '50mg'$  has not worked.   Plan:  -New Rx for Viagra '100mg'$  sent

## 2021-04-09 NOTE — Progress Notes (Addendum)
   This is a Careers information officer Note.  The care of the patient was discussed with Dr. Laural Golden and the assessment and plan was formulated with their assistance.  Please see their note for official documentation of the patient encounter.    Subjective:     Patient ID: [name], male   DOB: [birth year], 55 y.o.   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   Please see problem-based charting for the details of this encounter as well as the assessment and plan.    Attestation for Student Documentation:  I personally was present and performed or re-performed the history, physical exam and medical decision-making activities of this service and have verified that the service and findings are accurately documented in the student's note.  Rehman, Areeg N, DO 04/10/2021, 1:28 PM    Review of Systems  Constitutional:  Positive for fatigue.  HENT:  Negative for rhinorrhea and sore throat.   Respiratory:  Positive for shortness of breath.   Cardiovascular:  Negative for chest pain and leg swelling.  Gastrointestinal:  Negative for abdominal pain.  Musculoskeletal:  Positive for joint swelling.  Neurological:  Positive for dizziness.  Psychiatric/Behavioral:  Positive for sleep disturbance.       Objective:   Vitals:   04/09/21 1417  BP: 121/84  Pulse: 77  Temp: 98 F (36.7 C)  SpO2: 96%    Physical Exam Vitals reviewed.  HENT:     Head: Normocephalic and atraumatic.  Cardiovascular:     Rate and Rhythm: Normal rate and regular rhythm.     Pulses: Normal pulses.  Pulmonary:     Effort: Pulmonary effort is normal.     Breath sounds: Normal breath sounds.  Musculoskeletal:     Right hand: Swelling and tenderness present.     Left hand: Swelling and  tenderness present.  Skin:    General: Skin is warm and dry.  Neurological:     General: No focal deficit present.     Mental Status: He is alert and oriented to person, place, and time.  Psychiatric:        Mood and Affect: Mood is depressed. Affect is blunt.        Speech: Speech normal.        Behavior: Behavior normal.        Thought Content: Thought content normal.        Cognition and Memory: Cognition and memory normal.        Judgment: Judgment normal.       Assessment:     54 year old man here for follow-up.     Plan:     Please see problem-based charting for assessment and plan.    Attestation for Student Documentation:  I personally was present and performed or re-performed the history, physical exam and medical decision-making activities of this service and have verified that the service and findings are accurately documented in the student's note.  Rehman, Areeg N, DO 04/10/2021, 1:32 PM

## 2021-04-09 NOTE — Assessment & Plan Note (Addendum)
BP today 121/84, at goal of <130/90. Will continue current regimen and obtain labs to check for electrolyte abnormalities or renal dysfunction from HCTZ. Of note, he states he takes Clonidine 0.2 mg once daily, not BID.   Plan: -Continue HCTZ '25mg'$  and Clonidine 0.2 mg daily  -BMP results pending -Follow-up at next visit

## 2021-04-09 NOTE — Assessment & Plan Note (Signed)
[name] has continued to take Suboxone 8-'2mg'$  2 films daily, which has helped with his pain and cravings. He has been adherent with Suboxone for OUD contract.  Date of Induction: 08/16/2020  Current follow-up interval: 8 weeks   Last UDS Result: 01/10/21 positive for buphrenorphine, Xanax, THC  Plan:  -Continue Suboxone 8-'2mg'$  2 films daily, follow up in person in 8 weeks

## 2021-04-09 NOTE — Assessment & Plan Note (Signed)
Patient endorses 2-3 episodes of feeling dizzy and unsteady in the past week. They have occurred while he is working outside in the heat. We obtained orthostatic vitals today, which were borderline normal. 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 He denies feeling like the room is spinning with these recent episodes. Encouraged adequate fluid intake, particularly while working outside, and to check BP when these occur.  Plan:  -Follow-up at next visit

## 2021-04-09 NOTE — Assessment & Plan Note (Signed)
Mr. Reale notes that he has had long-standing bilateral hand swelling as well as joint pain that is worst in the mornings. 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 Putting voltaren gel on his hands provides some relief. He denies a family history of autoimmune disease. On physical exam, his hands and fingers are diffusely swollen and painful to palpation. Osteoarthritis appears most likely but the swelling may be dactylitis. Will perform work-up to rule out spondyloarthropathies such as psoriatic arthritis and rheumatic arthritis and obtain imaging.   Plan:  -Continue Voltaren gel  -CBC, ESR, ANA, RF, anti-citrulline results pending  -BL hand X-ray ordered

## 2021-04-10 MED ORDER — BUPRENORPHINE HCL-NALOXONE HCL 8-2 MG SL FILM: 1.0000 | ORAL_FILM | Freq: Two times a day (BID) | SUBLINGUAL | 1 refills | Status: DC

## 2021-04-10 NOTE — Progress Notes (Signed)
Attestation for Student Documentation:  I personally was present and performed or re-performed the history, physical exam and medical decision-making activities of this service and have verified that the service and findings are accurately documented in the student's note.   For details of today's visit and the status of his chronic medical issues please refer to the assessment and plan.   Tahnee Cifuentes N, DO 04/10/2021, 1:33 PM

## 2021-04-11 ENCOUNTER — Telehealth: Payer: Self-pay | Admitting: Behavioral Health

## 2021-04-11 ENCOUNTER — Ambulatory Visit: Payer: 59 | Admitting: Behavioral Health

## 2021-04-11 NOTE — Telephone Encounter (Signed)
Contacted Pt for today's telehealth appt @ 9:00am. Pt is working this morning & cannot speak in front of Co-Workers. Pt will attempt to schedule both Physician & Clinician in Oct on same day.  Dr. Theodis Shove

## 2021-04-14 LAB — BMP8+ANION GAP
Anion Gap: 17 mmol/L (ref 10.0–18.0)
BUN/Creatinine Ratio: 11 (ref 9–20)
BUN: 13 mg/dL (ref 6–24)
CO2: 26 mmol/L (ref 20–29)
Calcium: 8.9 mg/dL (ref 8.7–10.2)
Chloride: 98 mmol/L (ref 96–106)
Creatinine, Ser: 1.2 mg/dL (ref 0.76–1.27)
Glucose: 99 mg/dL (ref 65–99)
Potassium: 3.4 mmol/L — ABNORMAL LOW (ref 3.5–5.2)
Sodium: 141 mmol/L (ref 134–144)
eGFR: 71 mL/min/{1.73_m2} (ref >59)

## 2021-04-14 LAB — CBC
Hematocrit: 47.2 % (ref 37.5–51.0)
Hemoglobin: 16.1 g/dL (ref 13.0–17.7)
MCH: 29.9 pg (ref 26.6–33.0)
MCHC: 34.1 g/dL (ref 31.5–35.7)
MCV: 88 fL (ref 79–97)
Platelets: 252 10*3/uL (ref 150–450)
RBC: 5.38 x10E6/uL (ref 4.14–5.80)
RDW: 12.6 % (ref 11.6–15.4)
WBC: 8.1 10*3/uL (ref 3.4–10.8)

## 2021-04-14 LAB — ANTINUCLEAR ANTIBODIES, IFA: ANA Titer 1: NEGATIVE

## 2021-04-14 LAB — RHEUMATOID FACTOR: Rheumatoid fact SerPl-aCnc: <10 IU/mL (ref <14.0)

## 2021-04-14 LAB — SEDIMENTATION RATE: Sed Rate: 30 mm/hr (ref 0–30)

## 2021-04-15 LAB — ANTI-CCP AB, IGG + IGA (RDL): Anti-CCP Ab, IgG + IgA (RDL): <20 Units (ref <20)

## 2021-04-15 NOTE — Progress Notes (Addendum)
Internal Medicine Clinic Attending  I saw and evaluated the patient.  I personally confirmed the key portions of the history and exam documented by Dr.  Rehman  and I reviewed pertinent patient test results.  The assessment, diagnosis, and plan were formulated together and I agree with the documentation in the resident's note.  

## 2021-04-23 ENCOUNTER — Other Ambulatory Visit: Payer: Self-pay | Admitting: Internal Medicine

## 2021-04-23 DIAGNOSIS — I10 Essential (primary) hypertension: Secondary | ICD-10-CM

## 2021-04-28 ENCOUNTER — Other Ambulatory Visit: Payer: Self-pay | Admitting: Internal Medicine

## 2021-04-28 DIAGNOSIS — K219 Gastro-esophageal reflux disease without esophagitis: Secondary | ICD-10-CM

## 2021-05-09 ENCOUNTER — Other Ambulatory Visit: Payer: Self-pay | Admitting: Internal Medicine

## 2021-05-09 DIAGNOSIS — I10 Essential (primary) hypertension: Secondary | ICD-10-CM

## 2021-05-17 ENCOUNTER — Other Ambulatory Visit: Payer: Self-pay

## 2021-05-17 ENCOUNTER — Other Ambulatory Visit (HOSPITAL_COMMUNITY)
Admission: RE | Admit: 2021-05-17 | Discharge: 2021-05-17 | Disposition: A | Discharge status: Home / Self Care | Payer: 59 | Source: Ambulatory Visit | Attending: Internal Medicine | Admitting: Internal Medicine

## 2021-05-17 DIAGNOSIS — R1319 Other dysphagia: Secondary | ICD-10-CM | POA: Diagnosis present

## 2021-05-17 LAB — BASIC METABOLIC PANEL
Anion gap: 7 (ref 5–15)
BUN: 22 mg/dL — ABNORMAL HIGH (ref 6–20)
CO2: 29 mmol/L (ref 22–32)
Calcium: 8.8 mg/dL — ABNORMAL LOW (ref 8.9–10.3)
Chloride: 98 mmol/L (ref 98–111)
Creatinine, Ser: 1.14 mg/dL (ref 0.61–1.24)
GFR, Estimated: >60 mL/min (ref >60)
Glucose, Bld: 102 mg/dL — ABNORMAL HIGH (ref 70–99)
Potassium: 3.9 mmol/L (ref 3.5–5.1)
Sodium: 134 mmol/L — ABNORMAL LOW (ref 135–145)

## 2021-05-20 ENCOUNTER — Ambulatory Visit (HOSPITAL_COMMUNITY): Payer: 59 | Admitting: Anesthesiology

## 2021-05-20 ENCOUNTER — Ambulatory Visit (HOSPITAL_COMMUNITY)
Admission: RE | Admit: 2021-05-20 | Discharge: 2021-05-20 | Disposition: A | Discharge status: Home / Self Care | Payer: 59 | Attending: Internal Medicine | Admitting: Internal Medicine

## 2021-05-20 ENCOUNTER — Other Ambulatory Visit: Payer: Self-pay

## 2021-05-20 ENCOUNTER — Encounter (HOSPITAL_COMMUNITY)
Admission: RE | Disposition: A | Discharge status: Home / Self Care | Payer: Self-pay | Source: Home / Self Care | Attending: Internal Medicine

## 2021-05-20 ENCOUNTER — Encounter (HOSPITAL_COMMUNITY): Payer: Self-pay | Admitting: Internal Medicine

## 2021-05-20 DIAGNOSIS — Z8673 Personal history of transient ischemic attack (TIA), and cerebral infarction without residual deficits: Secondary | ICD-10-CM | POA: Diagnosis not present

## 2021-05-20 DIAGNOSIS — Z79899 Other long term (current) drug therapy: Secondary | ICD-10-CM | POA: Insufficient documentation

## 2021-05-20 DIAGNOSIS — Z885 Allergy status to narcotic agent status: Secondary | ICD-10-CM | POA: Diagnosis not present

## 2021-05-20 DIAGNOSIS — Z8379 Family history of other diseases of the digestive system: Secondary | ICD-10-CM | POA: Insufficient documentation

## 2021-05-20 DIAGNOSIS — K219 Gastro-esophageal reflux disease without esophagitis: Secondary | ICD-10-CM | POA: Diagnosis not present

## 2021-05-20 DIAGNOSIS — Z7982 Long term (current) use of aspirin: Secondary | ICD-10-CM | POA: Diagnosis not present

## 2021-05-20 DIAGNOSIS — Z8719 Personal history of other diseases of the digestive system: Secondary | ICD-10-CM | POA: Insufficient documentation

## 2021-05-20 DIAGNOSIS — J449 Chronic obstructive pulmonary disease, unspecified: Secondary | ICD-10-CM | POA: Diagnosis not present

## 2021-05-20 DIAGNOSIS — Z86010 Personal history of colonic polyps: Secondary | ICD-10-CM | POA: Diagnosis not present

## 2021-05-20 DIAGNOSIS — F17210 Nicotine dependence, cigarettes, uncomplicated: Secondary | ICD-10-CM | POA: Insufficient documentation

## 2021-05-20 DIAGNOSIS — Z91040 Latex allergy status: Secondary | ICD-10-CM | POA: Insufficient documentation

## 2021-05-20 DIAGNOSIS — I10 Essential (primary) hypertension: Secondary | ICD-10-CM | POA: Insufficient documentation

## 2021-05-20 DIAGNOSIS — Z8249 Family history of ischemic heart disease and other diseases of the circulatory system: Secondary | ICD-10-CM | POA: Insufficient documentation

## 2021-05-20 DIAGNOSIS — Z888 Allergy status to other drugs, medicaments and biological substances status: Secondary | ICD-10-CM | POA: Diagnosis not present

## 2021-05-20 DIAGNOSIS — G4730 Sleep apnea, unspecified: Secondary | ICD-10-CM | POA: Insufficient documentation

## 2021-05-20 DIAGNOSIS — Z9049 Acquired absence of other specified parts of digestive tract: Secondary | ICD-10-CM | POA: Insufficient documentation

## 2021-05-20 DIAGNOSIS — R1310 Dysphagia, unspecified: Secondary | ICD-10-CM | POA: Insufficient documentation

## 2021-05-20 HISTORY — PX: ESOPHAGOGASTRODUODENOSCOPY (EGD) WITH PROPOFOL: SHX5813

## 2021-05-20 HISTORY — PX: BIOPSY: SHX5522

## 2021-05-20 HISTORY — PX: MALONEY DILATION: SHX5535

## 2021-05-20 SURGERY — ESOPHAGOGASTRODUODENOSCOPY (EGD) WITH PROPOFOL
Anesthesia: General

## 2021-05-20 MED ORDER — DEXMEDETOMIDINE (PRECEDEX) IN NS 20 MCG/5ML (4 MCG/ML) IV SYRINGE
PREFILLED_SYRINGE | INTRAVENOUS | Status: DC | PRN
  Administered 2021-05-20: 20 ug via INTRAVENOUS

## 2021-05-20 MED ORDER — DEXMEDETOMIDINE (PRECEDEX) IN NS 20 MCG/5ML (4 MCG/ML) IV SYRINGE
PREFILLED_SYRINGE | INTRAVENOUS | Status: AC
  Filled 2021-05-20: qty 5

## 2021-05-20 MED ORDER — PROPOFOL 10 MG/ML IV BOLUS
INTRAVENOUS | Status: DC | PRN
Start: 2021-05-20 — End: 2021-05-20
  Administered 2021-05-20: 50 mg via INTRAVENOUS
  Administered 2021-05-20: 150 mg via INTRAVENOUS
  Administered 2021-05-20: 50 mg via INTRAVENOUS

## 2021-05-20 MED ORDER — STERILE WATER FOR IRRIGATION IR SOLN
Status: DC | PRN
  Administered 2021-05-20: 100 mL

## 2021-05-20 MED ORDER — LIDOCAINE HCL (CARDIAC) PF 100 MG/5ML IV SOSY
PREFILLED_SYRINGE | INTRAVENOUS | Status: DC | PRN
  Administered 2021-05-20: 50 mg via INTRATRACHEAL

## 2021-05-20 MED ORDER — LACTATED RINGERS IV SOLN: INTRAVENOUS | Status: DC

## 2021-05-20 NOTE — Anesthesia Preprocedure Evaluation (Signed)
Anesthesia Evaluation  Patient identified by MRN, date of birth, ID band Patient awake    Reviewed: Allergy & Precautions, H&P , NPO status , Patient's Chart, lab work & pertinent test results, reviewed documented beta blocker date and time   Airway Mallampati: II  TM Distance: >3 FB Neck ROM: full    Dental no notable dental hx.    Pulmonary sleep apnea , COPD, Current Smoker,    Pulmonary exam normal breath sounds clear to auscultation       Cardiovascular Exercise Tolerance: Good hypertension, negative cardio ROS   Rhythm:regular Rate:Normal     Neuro/Psych PSYCHIATRIC DISORDERS Anxiety Depression TIA Neuromuscular disease    GI/Hepatic hiatal hernia, GERD  Medicated,(+)     substance abuse  ,   Endo/Other  negative endocrine ROS  Renal/GU Renal disease  negative genitourinary   Musculoskeletal  (+) narcotic dependent  Abdominal   Peds  Hematology negative hematology ROS (+)   Anesthesia Other Findings   Reproductive/Obstetrics negative OB ROS                             Anesthesia Physical Anesthesia Plan  ASA: 3  Anesthesia Plan: General   Post-op Pain Management:    Induction:   PONV Risk Score and Plan: Propofol infusion  Airway Management Planned:   Additional Equipment:   Intra-op Plan:   Post-operative Plan:   Informed Consent: I have reviewed the patients History and Physical, chart, labs and discussed the procedure including the risks, benefits and alternatives for the proposed anesthesia with the patient or authorized representative who has indicated his/her understanding and acceptance.     Dental Advisory Given  Plan Discussed with: CRNA  Anesthesia Plan Comments:         Anesthesia Quick Evaluation

## 2021-05-20 NOTE — Transfer of Care (Signed)
Immediate Anesthesia Transfer of Care Note  Patient: [name]  Procedure(s) Performed: ESOPHAGOGASTRODUODENOSCOPY (EGD) WITH PROPOFOL BALLOON DILATION BIOPSY  Patient Location: Endoscopy Unit  Anesthesia Type:General  Level of Consciousness: drowsy  Airway & Oxygen Therapy: Patient Spontanous Breathing  Post-op Assessment: Report given to RN and Post -op Vital signs reviewed and stable  Post vital signs: Reviewed and stable  Last Vitals:  Vitals Value Taken Time  BP    Temp    Pulse    Resp    SpO2      Last Pain:  Vitals:   05/20/21 1453  TempSrc:   PainSc: 5       Patients Stated Pain Goal: 5 ([id number])  Complications: No notable events documented.

## 2021-05-20 NOTE — H&P (Signed)
$'@LOGO'P$ @   Primary Care Physician:  Mike Craze, DO Primary Gastroenterologist:  Dr. Gala Romney  Pre-Procedure History & Physical: HPI:  [name] is a 55 y.o. male here for further evaluation of dysphagia and abnormal BPE suggesting mild stricture in the GE junction and element of dysmotility.  Typical reflux symptoms well controlled on Protonix 40 mg twice daily.  Past Medical History:  Diagnosis Date   Acute pancreatitis    Anxiety    Arthritis    Colonic adenoma    COPD (chronic obstructive pulmonary disease) (HCC)    Depression    Diverticulitis of colon with perforation 08/2010   GERD (gastroesophageal reflux disease)    History of hiatal hernia    Hypertension    Polysubstance abuse ([place])    Renal insufficiency    Sleep apnea    Intolerant of CPAP   TIA (transient ischemic attack)     Past Surgical History:  Procedure Laterality Date   APPENDECTOMY     BIOPSY  12/14/2017   Procedure: BIOPSY;  Surgeon: Daneil Dolin, MD;  [facility]: [facility];  Service: Endoscopy;;  gastric    BIOPSY  10/13/2019   Procedure: BIOPSY;  Surgeon: Daneil Dolin, MD;  [facility]: [facility];  Service: Endoscopy;;  esophagus   clamp left in colon after abd surgery     COLON SURGERY  05/2011   [facility]. exp laparotomy with colostomy takedown, lysis of adhesions (3hours), loop ileostomy. complicated by abscess formation requiring percutaneous drainage   colonoscopy via stoma with endoscopy of [name]  04/2011   [facility]. two sessile polyps in trv colon. complete resection but only partial retrieval. congested mucosa in area 3cm proximal to stoma, erythematous mucosa in Hartmann pouch. repeat tcs 8/2017path-adenomatous polyp   COLONOSCOPY WITH PROPOFOL N/A 12/14/2017   Dr. Gala Romney: 2 4 to 6 mm polyps in the cecum, tubular adenomas, single rectal polyp tubular adenoma, nonbleeding internal hemorrhoids.  Appears to be a surgical clip at the appendiceal orifice.  Next colonoscopy in 5 years    ESOPHAGOGASTRODUODENOSCOPY  03/05/2012   erosive reflux esophagitis, bulbar erosions, gastric erosions/petichae, bx showed gastritis, no H.Pylori. Procedure: ESOPHAGOGASTRODUODENOSCOPY (EGD);  Surgeon: Daneil Dolin, MD;  Location: [facility];  Service: Endoscopy;  Laterality: N/A;  11:30   ESOPHAGOGASTRODUODENOSCOPY (EGD) WITH PROPOFOL N/A 12/14/2017   Dr. Gala Romney: Erosive reflux esophagitis with questionable mild stricturing/non-obstruction at this level status post dilation.  multiple gastric erosions in the stomach biopsy showed reactive gastropathy but no H. pylori.   ESOPHAGOGASTRODUODENOSCOPY (EGD) WITH PROPOFOL N/A 10/13/2019   negative Barrett's, normal stomach   HERNIA REPAIR     KNEE SURGERY Right 2006   MALONEY DILATION N/A 12/14/2017   Procedure: Venia Minks DILATION;  Surgeon: Daneil Dolin, MD;  Location: [facility];  Service: Endoscopy;  Laterality: N/A;   MULTIPLE EXTRACTIONS WITH ALVEOLOPLASTY  02/23/2012   Procedure: MULTIPLE EXTRACION WITH ALVEOLOPLASTY;  Surgeon: Gae Bon, DDS;  Location: [place];  Service: Oral Surgery;  Laterality: Bilateral;  Removal of left mandibular torus, multiple extractions with alveoloplasty   POLYPECTOMY  12/14/2017   Procedure: POLYPECTOMY;  Surgeon: Daneil Dolin, MD;  [facility]: [facility];  Service: Endoscopy;;   reversal of colostomy  10/2011   per patient, did not have report   right knee surgery     sigmoid colectomy with end colostomy, Hartmann's pouch  08/2010   [facility]. complicated diverticulitis   UMBILICAL HERNIA REPAIR      Prior  to Admission medications   Medication Sig Start Date End Date Taking? Authorizing Provider  albuterol (PROVENTIL HFA;VENTOLIN HFA) 108 (90 Base) MCG/ACT inhaler Inhale 2 puffs into the lungs every 6 (six) hours as needed for wheezing or shortness of breath.   Yes [provider]  aspirin EC 81 MG EC tablet Take 1 tablet (81 mg total) by mouth daily. Swallow whole. 04/27/20  Yes Barton Dubois,  MD  Buprenorphine HCl-Naloxone HCl (SUBOXONE) 8-2 MG FILM Place 1 Film under the tongue in the morning and at bedtime. Patient taking differently: Place 1 Film under the tongue daily. May have additional dose if needed 04/10/21  Yes Lucious Groves, DO  Cholecalciferol 25 MCG (1000 UT) TBDP Take 25 mcg by mouth daily. Patient taking differently: Take 1,000 Units by mouth daily. 10/12/20  Yes Cato Mulligan, MD  cloNIDine (CATAPRES) 0.1 MG tablet Take 2 tablets by mouth twice daily 05/14/21  Yes Rehman, Areeg N, DO  diclofenac Sodium (VOLTAREN) 1 % GEL Apply 2 g topically 4 (four) times daily. Patient taking differently: Apply 2 g topically 2 (two) times daily as needed (pain). 03/18/21  Yes Atway, Rayann N, DO  ezetimibe (ZETIA) 10 MG tablet Take 1 tablet (10 mg total) by mouth daily. Patient taking differently: Take 10 mg by mouth 3 (three) times a week. 10/11/20 10/11/21 Yes Cato Mulligan, MD  gabapentin (NEURONTIN) 300 MG capsule Take 300 mg by mouth at bedtime as needed (pain).   Yes [provider]  hydrochlorothiazide (HYDRODIURIL) 25 MG tablet Take 1 tablet by mouth once daily Patient taking differently: Take 25 mg by mouth daily. 04/24/21  Yes Rehman, Areeg N, DO  hydrocortisone (ANUSOL-HC) 2.5 % rectal cream Place 1 application rectally 2 (two) times daily. Patient taking differently: Place 1 application rectally 2 (two) times daily as needed for hemorrhoids. 03/21/21  Yes Carlan, Chelsea L, NP  pantoprazole (PROTONIX) 40 MG tablet Take 1 tablet by mouth once daily Patient taking differently: Take 40 mg by mouth 2 (two) times daily as needed (acid reflux). 04/29/21  Yes Rehman, Areeg N, DO  sildenafil (VIAGRA) 100 MG tablet Take 1 tablet (100 mg total) by mouth as needed for erectile dysfunction. 04/09/21 04/09/22 Yes Rehman, Areeg N, DO  traZODone (DESYREL) 50 MG tablet Take 1 tablet (50 mg total) by mouth at bedtime. Patient not taking: No sig reported 04/09/21 07/08/21  Rehman, Areeg N, DO     Allergies as of 04/05/2021 - Review Complete 03/21/2021  Allergen Reaction Noted   Codeine Itching and Nausea And Vomiting    Latex Other (See Comments) 03/09/2012   Lisinopril  05/27/2017   Morphine Hives    Adhesive [tape] Rash 03/08/2012    Family History  Problem Relation Age of Onset   Stomach cancer Father    Lung cancer Father    Throat cancer Father    Cancer Father        lung and throat cancer   Hypertension Father    Liver disease Cousin        cirrhosis, etoh   Throat cancer Paternal Uncle    Lung cancer Paternal Uncle    Colon cancer Cousin 52   Heart disease Mother    Hypertension Mother    Colon cancer Maternal Uncle    Throat cancer Paternal Uncle    Lung cancer Paternal Uncle    Throat cancer Paternal Uncle    Lung cancer Paternal Uncle     Social History   Socioeconomic History  Marital status: Single    Spouse name: Not on file   Number of children: 0   Years of education: Not on file   Highest education level: Not on file  Occupational History   Not on file  Tobacco Use   Smoking status: Every Day    Packs/day: 1.00    Years: 37.00    Pack years: 37.00    Types: Cigarettes   Smokeless tobacco: Former    Types: Snuff   Tobacco comments:    Trying to quit, has nicotine patches at home which have helped him quit before  Vaping Use   Vaping Use: Never used  Substance and Sexual Activity   Alcohol use: Not Currently   Drug use: Yes    Frequency: 2.0 times per week    Types: Marijuana, Benzodiazepines    Comment: Marijuana last used 05/19/21   Sexual activity: Not on file  Other Topics Concern   Not on file  Social History Narrative   Not on file   Social Determinants of Health   Financial Resource Strain: Not on file  Food Insecurity: Not on file  Transportation Needs: Not on file  Physical Activity: Not on file  Stress: Not on file  Social Connections: Not on file  Intimate Partner Violence: Not on file    Review of  Systems: See HPI, otherwise negative ROS  Physical Exam: BP (!) 132/93   Temp 98.4 F (36.9 C) (Oral)   Resp 10   Ht '5\' 6"'$  (1.676 m)   Wt 89.4 kg   SpO2 97%   BMI 31.80 kg/m  General:   Alert,  Well-developed, well-nourished, pleasant and cooperative in NAD Neck:  Supple; no masses or thyromegaly. No significant cervical adenopathy. Lungs:  Clear throughout to auscultation.   No wheezes, crackles, or rhonchi. No acute distress. Heart:  Regular rate and rhythm; no murmurs, clicks, rubs,  or gallops. Abdomen: Non-distended, normal bowel sounds.  Soft and nontender without appreciable mass or hepatosplenomegaly.   Impression/Plan:    55 year old gentleman with vague esophageal dysphagia.  BPE previously suggest dysmotility and mild stricture in the GE junction.  I have offered the patient a EGD with potential esophageal dilation today as feasible/appropriate per plan. The risks, benefits, limitations, alternatives and imponderables have been reviewed with the patient. Potential for esophageal dilation, biopsy, etc. have also been reviewed.  Questions have been answered. All parties agreeable.    Notice: This dictation was prepared with Dragon dictation along with smaller phrase technology. Any transcriptional errors that result from this process are unintentional and may not be corrected upon review.

## 2021-05-20 NOTE — Anesthesia Procedure Notes (Signed)
Date/Time: 05/20/2021 3:02 PM Performed by: Orlie Dakin, CRNA Pre-anesthesia Checklist: Patient identified, Emergency Drugs available, Suction available and Patient being monitored Patient Re-evaluated:Patient Re-evaluated prior to induction Oxygen Delivery Method: Nasal cannula Induction Type: IV induction Placement Confirmation: positive ETCO2

## 2021-05-20 NOTE — Discharge Instructions (Signed)
EGD Discharge instructions Please read the instructions outlined below and refer to this sheet in the next few weeks. These discharge instructions provide you with general information on caring for yourself after you leave the hospital. Your doctor may also give you specific instructions. While your treatment has been planned according to the most current medical practices available, unavoidable complications occasionally occur. If you have any problems or questions after discharge, please call your doctor. ACTIVITY You may resume your regular activity but move at a slower pace for the next 24 hours.  Take frequent rest periods for the next 24 hours.  Walking will help expel (get rid of) the air and reduce the bloated feeling in your abdomen.  No driving for 24 hours (because of the anesthesia (medicine) used during the test).  You may shower.  Do not sign any important legal documents or operate any machinery for 24 hours (because of the anesthesia used during the test).  NUTRITION Drink plenty of fluids.  You may resume your normal diet.  Begin with a light meal and progress to your normal diet.  Avoid alcoholic beverages for 24 hours or as instructed by your caregiver.  MEDICATIONS You may resume your normal medications unless your caregiver tells you otherwise.  WHAT YOU CAN EXPECT TODAY You may experience abdominal discomfort such as a feeling of fullness or "gas" pains.  FOLLOW-UP Your doctor will discuss the results of your test with you.  SEEK IMMEDIATE MEDICAL ATTENTION IF ANY OF THE FOLLOWING OCCUR: Excessive nausea (feeling sick to your stomach) and/or vomiting.  Severe abdominal pain and distention (swelling).  Trouble swallowing.  Temperature over 101 F (37.8 C).  Rectal bleeding or vomiting of blood.     Your esophagus was stretched today.  There is a nodule in your esophagus which was biopsied  Increase Protonix to 40 mg twice daily (before breakfast and supper) new  prescription provided  Office visit with [place] in 3 months  Further recommendations to follow pending review of pathology report  At patient request, I called Tylene Fantasia at [phone] -reviewed findings and recommendations

## 2021-05-21 NOTE — Anesthesia Postprocedure Evaluation (Signed)
Anesthesia Post Note  Patient: [name]  Procedure(s) Performed: ESOPHAGOGASTRODUODENOSCOPY (EGD) WITH PROPOFOL BALLOON DILATION BIOPSY  Patient location during evaluation: Phase II Anesthesia Type: General Level of consciousness: awake Pain management: pain level controlled Vital Signs Assessment: post-procedure vital signs reviewed and stable Respiratory status: spontaneous breathing and respiratory function stable Cardiovascular status: blood pressure returned to baseline and stable Postop Assessment: no headache and no apparent nausea or vomiting Anesthetic complications: no Comments: Late entry   No notable events documented.   Last Vitals:  Vitals:   05/20/21 1510 05/20/21 1513  BP: 91/64 111/83  Pulse: 60 64  Resp: 12 16  Temp: 36.4 C   SpO2: 93% 94%    Last Pain:  Vitals:   05/20/21 1513  TempSrc:   PainSc: 0-No pain                 Louann Sjogren

## 2021-05-22 LAB — SURGICAL PATHOLOGY

## 2021-05-23 ENCOUNTER — Encounter: Payer: Self-pay | Admitting: Internal Medicine

## 2021-05-23 ENCOUNTER — Other Ambulatory Visit: Payer: Self-pay

## 2021-05-23 ENCOUNTER — Ambulatory Visit (INDEPENDENT_AMBULATORY_CARE_PROVIDER_SITE_OTHER): Payer: 59 | Admitting: Internal Medicine

## 2021-05-23 VITALS — BP 133/104 | HR 83 | Temp 97.5°F | Resp 24 | Ht 66.0 in | Wt 205.6 lb

## 2021-05-23 DIAGNOSIS — M25462 Effusion, left knee: Secondary | ICD-10-CM | POA: Diagnosis not present

## 2021-05-23 DIAGNOSIS — M109 Gout, unspecified: Secondary | ICD-10-CM

## 2021-05-23 DIAGNOSIS — M25562 Pain in left knee: Secondary | ICD-10-CM | POA: Diagnosis not present

## 2021-05-23 MED ORDER — PREDNISONE 20 MG PO TABS
ORAL_TABLET | ORAL | 0 refills | Status: AC
Start: 2021-05-23 — End: 2021-06-02

## 2021-05-23 MED ORDER — PREDNISONE 20 MG PO TABS: 40.0000 mg | ORAL_TABLET | Freq: Every day | ORAL | 0 refills | Status: DC

## 2021-05-23 NOTE — Op Note (Signed)
[facility] Patient Name: [name] Procedure Date: 05/20/2021 2:32 PM MRN: [id number] Date of Birth: [birth year] Attending MD: Norvel Richards , MD CSN: [id number] Age: 55 Admit Type: Outpatient Procedure:                Upper GI endoscopy Indications:              Dysphagia; abnormal BPE Providers:                Norvel Richards, MD, Lambert Mody, Nelma Rothman, Technician Referring MD:              Medicines:                Propofol per Anesthesia Complications:            No immediate complications. Estimated Blood Loss:     Estimated blood loss was minimal. Procedure:                Pre-Anesthesia Assessment:                           - Prior to the procedure, a History and Physical                            was performed, and patient medications and                            allergies were reviewed. The patient's tolerance of                            previous anesthesia was also reviewed. The risks                            and benefits of the procedure and the sedation                            options and risks were discussed with the patient.                            All questions were answered, and informed consent                            was obtained. Prior Anticoagulants: The patient                            last took Eliquis (apixaban) 2 days prior to the                            procedure. ASA Grade Assessment: III - A patient                            with severe systemic disease. After reviewing the  risks and benefits, the patient was deemed in                            satisfactory condition to undergo the procedure.                           After obtaining informed consent, the endoscope was                            passed under direct vision. Throughout the                            procedure, the patient's blood pressure, pulse, and                            oxygen  saturations were monitored continuously. The                            [id number]) scope was introduced through the                            mouth, and advanced to the second part of duodenum.                            The upper GI endoscopy was accomplished without                            difficulty. The patient tolerated the procedure                            well. Scope In: 2:59:26 PM Scope Out: 3:06:25 PM Total Procedure Duration: 0 hours 6 minutes 59 seconds  Findings:      3 x 1 cm inlet patch present with 5 mm nodule superimposed. Please see       photos. Otherwise, tubular esophagus appeared widely patent       /normal-appearing.      The scope was withdrawn. Dilation was performed with a Maloney dilator       with no resistance at 56 Fr. Dilation was performed with a Maloney       dilator with no resistance at 53 Fr. The dilation site was examined       following endoscope reinsertion and showed no change. Estimated blood       loss: none.      Gastric cavity appeared normal.      The duodenal bulb and second portion of the duodenum were normal. Impression:               - Inlet patch with superimposed nodule - status                            post biopsy normal esophagus. Dilated.                            Normal-appearing gastric mucosa.                           -  Normal duodenal bulb and second portion of the                            duodenum. Moderate Sedation:      Moderate (conscious) sedation was personally administered by an       anesthesia professional. The following parameters were monitored: oxygen       saturation, heart rate, blood pressure, respiratory rate, EKG, adequacy       of pulmonary ventilation, and response to care. Recommendation:           - Patient has a contact number available for                            emergencies. The signs and symptoms of potential                            delayed complications were discussed with the                             patient. Return to normal activities tomorrow.                            Written discharge instructions were provided to the                            patient.                           - Resume previous diet.                           - Continue present medications. Follow-up on                            pathology.                           - Return to my office in 3 months. Procedure Code(s):        --- Professional ---                           [id number], Esophagogastroduodenoscopy, flexible,                            transoral; diagnostic, including collection of                            specimen(s) by brushing or washing, when performed                            (separate procedure)                           43450, Dilation of esophagus, by unguided sound or                            bougie, single or multiple  passes Diagnosis Code(s):        --- Professional ---                           R13.10, Dysphagia, unspecified CPT copyright 2019 [employer]. All rights reserved. The codes documented in this report are preliminary and upon coder review may  be revised to meet current compliance requirements. [name]. Bayleigh Loflin, MD Norvel Richards, MD 05/23/2021 10:13:10 AM This report has been signed electronically. Number of Addenda: 0

## 2021-05-23 NOTE — Patient Instructions (Addendum)
[name], it was a pleasure seeing you today!  Today we discussed: You presented with significant left knee pain. You have been having knee pain on and off for about 6 months with relieve with Voltaren gel. Your knee appears to red and tender to touch. This appears consistent with gout. We will treat with prednisone 40 mg for 5 days and 20 mg for 5 days after that. We will order uric acid level and if negative order further imaging.  I have ordered the following labs today:  Lab Orders         Uric acid      Referrals ordered today:   Referral Orders  No referral(s) requested today     I have ordered the following medication/changed the following medications:   Stop the following medications: Medications Discontinued During This Encounter  Medication Reason   predniSONE (DELTASONE) 20 MG tablet      Start the following medications: Meds ordered this encounter  Medications   DISCONTD: predniSONE (DELTASONE) 20 MG tablet    Sig: Take 2 tablets (40 mg total) by mouth daily with breakfast for 5 days.    Dispense:  10 tablet    Refill:  0   predniSONE (DELTASONE) 20 MG tablet    Sig: Take 2 tablets (40 mg total) by mouth daily with breakfast for 5 days, THEN 1 tablet (20 mg total) daily with breakfast for 5 days.    Dispense:  15 tablet    Refill:  0     Follow-up: 2 month follow up unless needed earlier  Please make sure to arrive 15 minutes prior to your next appointment. If you arrive late, you may be asked to reschedule.   We look forward to seeing you next time. Please call our clinic at [phone] if you have any questions or concerns. The best time to call is Monday-Friday from 9am-4pm, but there is someone available 24/7. If after hours or the weekend, call the main hospital number and ask for the Internal Medicine Resident On-Call. If you need medication refills, please notify your pharmacy one week in advance and they will send [place] a request.  Thank you for  letting us take part in your care. Wishing you the best!  Thank you, Idamae Schuller, MD

## 2021-05-24 ENCOUNTER — Telehealth: Payer: Self-pay | Admitting: *Deleted

## 2021-05-24 DIAGNOSIS — M109 Gout, unspecified: Secondary | ICD-10-CM | POA: Insufficient documentation

## 2021-05-24 LAB — URIC ACID: Uric Acid: 8.8 mg/dL — ABNORMAL HIGH (ref 3.8–8.4)

## 2021-05-24 NOTE — Progress Notes (Signed)
CC: left knee pain  HPI:  [name] is a 55 y.o. with medical history as below presenting to [facility] for left knee pain.  Please see problem-based list for further details, assessments, and plans.  Past Medical History:  Diagnosis Date   Acute pancreatitis    Anxiety    Arthritis    Colonic adenoma    COPD (chronic obstructive pulmonary disease) (HCC)    Depression    Diverticulitis of colon with perforation 08/2010   GERD (gastroesophageal reflux disease)    History of hiatal hernia    Hypertension    Polysubstance abuse ([place])    Renal insufficiency    Sleep apnea    Intolerant of CPAP   TIA (transient ischemic attack)    Review of Systems: Review of system negative unless stated in the problem list or HPI.      Physical Exam:  Vitals:   05/23/21 1508  BP: (!) 133/104  Pulse: 83  Resp: (!) 24  Temp: (!) 97.5 F (36.4 C)  TempSrc: Oral  SpO2: 97%  Weight: 205 lb 9.6 oz (93.3 kg)  Height: '5\' 6"'$  (1.676 m)    Physical Exam Constitutional:      General: He is not in acute distress.    Appearance: Normal appearance. He is not ill-appearing.  HENT:     Head: Normocephalic and atraumatic.     Right Ear: External ear normal.     Left Ear: External ear normal.     Nose: Nose normal.     Mouth/Throat:     Mouth: Mucous membranes are moist.     Pharynx: Oropharynx is clear. No oropharyngeal exudate or posterior oropharyngeal erythema.  Eyes:     Extraocular Movements: Extraocular movements intact.     Conjunctiva/sclera: Conjunctivae normal.     Pupils: Pupils are equal, round, and reactive to light.  Cardiovascular:     Rate and Rhythm: Normal rate and regular rhythm.     Pulses: Normal pulses.     Heart sounds: Normal heart sounds. No murmur heard.   No friction rub.  Pulmonary:     Effort: Pulmonary effort is normal.     Breath sounds: Normal breath sounds.  Abdominal:     General: Bowel sounds are normal.     Palpations: Abdomen is soft.   Musculoskeletal:        General: No deformity or signs of injury. Normal range of motion.     Cervical back: Normal range of motion and neck supple.     Left knee: Swelling and erythema present.     Right lower leg: No edema.     Left lower leg: No edema.     Comments: Left knee is swollen and red. It is warm to touch compared to the right knee. The swelling is more on the medial side. The pain is present diffusely in the knee with more localization on the medial side.   Skin:    Capillary Refill: Capillary refill takes less than 2 seconds.     Coloration: Skin is not jaundiced or pale.     Findings: No erythema, lesion or rash.  Neurological:     General: No focal deficit present.     Mental Status: He is alert and oriented to person, place, and time. Mental status is at baseline.  Psychiatric:        Mood and Affect: Mood normal.        Behavior: Behavior normal.    Assessment &  Plan:   See Encounters Tab for problem based charting.  Patient seen with Dr. Letha Cape, MD

## 2021-05-24 NOTE — Telephone Encounter (Signed)
Discussed with Dr. Humphrey Rolls. He wants the Rx for prednisone that is on current med list. Arrie Aran was busy. Left message with Maudie Mercury.

## 2021-05-24 NOTE — Assessment & Plan Note (Signed)
See left knee pain for information

## 2021-05-24 NOTE — Telephone Encounter (Signed)
Dawn with [facility] called in asking for clarification on Prednisone. States 2 Rxs with different instructions were sent yesterday. Please advise.

## 2021-05-24 NOTE — Assessment & Plan Note (Signed)
Assessment: Patient has left knee pain that has been present for about 6 months. He states it was greatly helped by Voltaren gel but that has become ineffective now. On examination, knee was swollen and red. He states it swells and goes down periodically. The swelling was medial and tenderness was present mostly on the medial side. The knee was painful with movement. His previous UA in 2021 had uric acid crystals, which makes this suspicious for gout. Plan is to treat with prednisone and order uric acid and if uric acid comes back negative, order imaging and place referral for orthopedics.  Plan: -Predisone 40 mg for 5 days then 20 mg for 5 more days for total of 10 days -Order uric acid -Follow up result  Addendum: Uric acid elevated at 8.8.

## 2021-05-27 ENCOUNTER — Encounter (HOSPITAL_COMMUNITY): Payer: Self-pay | Admitting: Internal Medicine

## 2021-05-27 ENCOUNTER — Telehealth: Payer: Self-pay | Admitting: Internal Medicine

## 2021-05-27 ENCOUNTER — Other Ambulatory Visit: Payer: Self-pay | Admitting: Internal Medicine

## 2021-05-27 DIAGNOSIS — I10 Essential (primary) hypertension: Secondary | ICD-10-CM

## 2021-05-27 MED ORDER — HYDROCHLOROTHIAZIDE 25 MG PO TABS: 25.0000 mg | ORAL_TABLET | Freq: Every day | ORAL | 3 refills | Status: DC

## 2021-05-27 NOTE — Telephone Encounter (Signed)
I called Mr. Feliu to assess response to prednisone for empiric management of presumed gout, clinical dx (knee was too exquisitely tender last week for a joint aspirate).  He is experiencing significant improvement in pain and has been ambulatory, though did take the weekend off and tried to minimize weight bearing.  He agrees to return to clinic this week (has an appt with urology on 9/22 and hopes to come that day) for joint aspiration.  Uric acid noted to be elevated.  Pt does not drink ETOH.

## 2021-05-30 ENCOUNTER — Other Ambulatory Visit: Payer: Self-pay

## 2021-05-30 ENCOUNTER — Ambulatory Visit (INDEPENDENT_AMBULATORY_CARE_PROVIDER_SITE_OTHER): Payer: 59 | Admitting: Internal Medicine

## 2021-05-30 ENCOUNTER — Encounter: Payer: Self-pay | Admitting: Internal Medicine

## 2021-05-30 DIAGNOSIS — M25562 Pain in left knee: Secondary | ICD-10-CM

## 2021-05-30 NOTE — Progress Notes (Signed)
   CC: f/u for knee aspiration  HPI:[name] is a 55 y.o. male who presents for evaluation of f/u. Please see individual problem based A/P for details.  Please see encounters tab for problem based charting.    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 Consent was obtained for the procedure. The joint was prepped with Betadine. A 22 gauge needle was inserted into the superior aspect of the joint from a medial approach to access the suprapatellar pouch. 0 ml of fluid was removed from the joint. 2 ml 1% lidocaine and 5 ml of Triamcinolone was then injected into the joint through the same needle. The needle was removed and the area cleansed and dressed.  Complications:  None; patient tolerated the procedure well.  Problem List Items Addressed This Visit       Other   Left knee pain    Pt with presumed gout based off of hx and physical and elevated uric acid. Pain and swelling improved with prednisone, and able to move, ambulate, and weightbear.   Attempted joint aspiration of fluid pocket inferior to patellofemoral tendon  9/22 using ultrasound guidance but unable to aspirate fluid. Kenalog injection for symptomatic control.         Depression, PHQ-9: Based on the patients  [place] Visit from 05/30/2021 in [place]  PHQ-9 Total Score 0      score we have 0.  Past Medical History:  Diagnosis Date   Acute pancreatitis    Anxiety    Arthritis    Colonic adenoma    COPD (chronic obstructive pulmonary disease) (HCC)    Depression    Diverticulitis of colon with perforation 08/2010   GERD (gastroesophageal reflux disease)    History of hiatal hernia    Hypertension     Polysubstance abuse ([place])    Renal insufficiency    Sleep apnea    Intolerant of CPAP   TIA (transient ischemic attack)    Review of Systems:   Review of Systems  Constitutional: Negative.   HENT: Negative.    Eyes: Negative.   Respiratory: Negative.    Cardiovascular: Negative.   Gastrointestinal: Negative.   Genitourinary: Negative.   Musculoskeletal: Negative.   Skin: Negative.   Neurological: Negative.   Psychiatric/Behavioral: Negative.      Physical Exam: Vitals:   05/30/21 1042  BP: 133/88  Pulse: 65  Temp: (!) 97.4 F (36.3 C)  TempSrc: Oral  SpO2: 98%  Weight: 209 lb 4.8 oz (94.9 kg)  Height: 5\' 6"  (1.676 m)     General: alert and oriented, no acute distress HEENT: Conjunctiva nl , antiicteric sclerae, moist mucous membranes, no exudate or erythema Cardiovascular: Normal rate, regular rhythm.  No murmurs, rubs, or gallops Pulmonary : Equal breath sounds, No wheezes, rales, or rhonchi Abdominal: soft, nontender,  bowel sounds present Ext: Mild edema and erythema noted of left knee, tender to palpation, full range of motion.  Assessment & Plan:   See Encounters Tab for problem based charting.  Patient seen with Dr. Evette Doffing

## 2021-05-30 NOTE — Patient Instructions (Signed)
Dear [name],  Thank you for trusting [place] with your care today.   Today we performed a joint aspiration and knee injection. We were unable to obtain any fluid from the joint for analysis. However, we did inject a steroid medication into our knee. This steroid should help with the inflammation and pain. I will call you tomorrow to see how you are feeling.  If you notice any increase in redness, warmth, swelling, pain, fever, or chills, please call our office immediate as these could be signs of infection.

## 2021-06-03 ENCOUNTER — Encounter: Payer: Self-pay | Admitting: Internal Medicine

## 2021-06-03 NOTE — Assessment & Plan Note (Signed)
Pt with presumed gout based off of hx and physical and elevated uric acid. Pain and swelling improved with prednisone, and able to move, ambulate, and weightbear.   Attempted joint aspiration of fluid pocket inferior to patellofemoral tendon  9/22 using ultrasound guidance but unable to aspirate fluid. Kenalog injection for symptomatic control.

## 2021-06-04 NOTE — Progress Notes (Signed)
Internal Medicine Clinic Attending  I saw and evaluated the patient.  I personally confirmed the key portions of the history and exam documented by Dr. Elliot Gurney and I reviewed pertinent patient test results.  The assessment, diagnosis, and plan were formulated together and I agree with the documentation in the resident's note.   Small left knee effusion seen on ultrasound, improving pain and range of motion with prednisone therapy suggesting improving gout or CPPD. We attempted arthrocentesis, gained access to the joint space, lot of discomfort for the patient still, unable to drain fluid, but we did inject the knee with steroid. He can stop prednisone now. If he has recurrent flare, can try arthrocentesis again to clarify the underlying diagnosis.

## 2021-06-05 ENCOUNTER — Other Ambulatory Visit: Payer: Self-pay

## 2021-06-05 DIAGNOSIS — F1110 Opioid abuse, uncomplicated: Secondary | ICD-10-CM

## 2021-06-05 MED ORDER — BUPRENORPHINE HCL-NALOXONE HCL 8-2 MG SL FILM: 1.0000 | ORAL_FILM | Freq: Two times a day (BID) | SUBLINGUAL | 1 refills | Status: DC

## 2021-06-05 NOTE — Telephone Encounter (Signed)
Buprenorphine HCl-Naloxone HCl (SUBOXONE) 8-2 MG FILM, REFILL REQUEST @ [place], [place].

## 2021-06-10 ENCOUNTER — Ambulatory Visit: Payer: 59 | Admitting: Behavioral Health

## 2021-06-24 ENCOUNTER — Ambulatory Visit: Payer: 59 | Admitting: Behavioral Health

## 2021-06-24 DIAGNOSIS — F419 Anxiety disorder, unspecified: Secondary | ICD-10-CM

## 2021-06-24 DIAGNOSIS — F331 Major depressive disorder, recurrent, moderate: Secondary | ICD-10-CM

## 2021-06-24 NOTE — BH Specialist Note (Signed)
Integrated Behavioral Health via Telemedicine Visit  06/24/2021 [name] [id number]  Number of [place] visits: 4/6; Last seen: 03/18/2021 Session Start time: 1:00pm  Session End time: 1:30pm Total time: 30  Referring Provider: Dr. Valentino Hue, DO for Recovery Support/Grief issues Patient/Family location: Pt is home in private [facility] Provider location: Working remotely All persons participating in visit: Pt & Clinician Types of Service: Individual psychotherapy  I connected with [name] and/or [name]'s  self  via  Telephone or Video Enabled Telemedicine Application  (Video is Caregility application) and verified that I am speaking with the correct person using two identifiers. Discussed confidentiality:  4th visit  I discussed the limitations of telemedicine and the availability of in person appointments.  Discussed there is a possibility of technology failure and discussed alternative modes of communication if that failure occurs.  I discussed that engaging in this telemedicine visit, they consent to the provision of behavioral healthcare and the services will be billed under their insurance.  Patient and/or legal guardian expressed understanding and consented to Telemedicine visit:  4th visit  Presenting Concerns: Patient and/or family reports the following symptoms/concerns: Pt is dealing w/an Ex-GF who is actively using meth & various pills. He has stopped trying to help her. It is not good for his own Recovery efforts. Pt needs her to pick up her clothes from his home.  Pt is handling his [name]' death arrangements. He is still paying for the funeral.  Pt is exp'g dizzy spells almost daily. Duration of problem: wks to mos; Severity of problem: mild trending moderate  Patient and/or Family's Strengths/Protective Factors: Concrete supports in place (healthy food, safe environments, etc.) and Sense of purpose. Pt id 6 yrs out from getting out  of jail. He has no intention of going back for any reason. He is trying to manage his life, secure Disability & work as he is able.  Pt has supportive Aunt [name] who lives beside him.  Goals Addressed: Patient will:  Reduce symptoms of: anxiety, depression, and stress caused partially from triggering beh Ex-GF displays anytime he sees her.  Increase knowledge and/or ability of: coping skills, healthy habits, stress reduction, and Recovery Support/Relapse Prevention.    Demonstrate ability to: Increase healthy adjustment to current life circumstances and Begin healthy grieving over loss  Progress towards Goals: Ongoing  Interventions: Interventions utilized:  Solution-Focused Strategies, Behavioral Activation, and Supportive Counseling Standardized Assessments completed:  screeners prn  Patient and/or Family Response: Pt receptive to call today & does not request future appt. Pt is aware & acknowledges if he needs support he can call for [facility] appt @ [facility].  Assessment: Patient currently experiencing reduction in his stress due to Ex-GF's beh. Pt has set improved boundaries w/her & is not trying to help w/her addiction issues any longer.  Patient may benefit from Disability determination & support in his Recovery..  Plan: Follow up with behavioral health clinician on : Pt does not request future IBH calls today. Behavioral recommendations: Keep doing the positive things you are doing for yourself. Call [facility] & make appt w/your Physician for the dizzy spells you are having daily. Referral(s):  None @ this time  I discussed the assessment and treatment plan with the patient and/or parent/guardian. They were provided an opportunity to ask questions and all were answered. They agreed with the plan and demonstrated an understanding of the instructions.   They were advised to call back or seek an in-person evaluation if  the symptoms worsen or if the condition fails to improve as  anticipated.  Donnetta Hutching, LMFT

## 2021-06-26 ENCOUNTER — Encounter: Payer: Self-pay | Admitting: Internal Medicine

## 2021-06-26 ENCOUNTER — Ambulatory Visit (INDEPENDENT_AMBULATORY_CARE_PROVIDER_SITE_OTHER): Payer: 59 | Admitting: Internal Medicine

## 2021-06-26 ENCOUNTER — Emergency Department (HOSPITAL_COMMUNITY): Payer: 59

## 2021-06-26 ENCOUNTER — Other Ambulatory Visit: Payer: Self-pay

## 2021-06-26 ENCOUNTER — Telehealth: Payer: Self-pay

## 2021-06-26 ENCOUNTER — Ambulatory Visit (HOSPITAL_COMMUNITY)
Admission: RE | Admit: 2021-06-26 | Discharge: 2021-06-26 | Disposition: A | Discharge status: Home / Self Care | Payer: 59 | Source: Ambulatory Visit | Attending: Internal Medicine | Admitting: Internal Medicine

## 2021-06-26 ENCOUNTER — Emergency Department (HOSPITAL_COMMUNITY)
Admission: EM | Admit: 2021-06-26 | Discharge: 2021-06-26 | Disposition: A | Discharge status: Home / Self Care | Payer: 59 | Attending: Emergency Medicine | Admitting: Emergency Medicine

## 2021-06-26 ENCOUNTER — Encounter (HOSPITAL_COMMUNITY): Payer: Self-pay | Admitting: Emergency Medicine

## 2021-06-26 VITALS — BP 136/101 | HR 69 | Temp 97.7°F | Resp 32 | Ht 66.0 in | Wt 214.5 lb

## 2021-06-26 DIAGNOSIS — J449 Chronic obstructive pulmonary disease, unspecified: Secondary | ICD-10-CM | POA: Insufficient documentation

## 2021-06-26 DIAGNOSIS — R42 Dizziness and giddiness: Secondary | ICD-10-CM | POA: Insufficient documentation

## 2021-06-26 DIAGNOSIS — Z79899 Other long term (current) drug therapy: Secondary | ICD-10-CM | POA: Insufficient documentation

## 2021-06-26 DIAGNOSIS — I10 Essential (primary) hypertension: Secondary | ICD-10-CM | POA: Diagnosis not present

## 2021-06-26 DIAGNOSIS — N289 Disorder of kidney and ureter, unspecified: Secondary | ICD-10-CM | POA: Insufficient documentation

## 2021-06-26 DIAGNOSIS — F17210 Nicotine dependence, cigarettes, uncomplicated: Secondary | ICD-10-CM | POA: Insufficient documentation

## 2021-06-26 DIAGNOSIS — Z8673 Personal history of transient ischemic attack (TIA), and cerebral infarction without residual deficits: Secondary | ICD-10-CM | POA: Diagnosis not present

## 2021-06-26 LAB — COMPREHENSIVE METABOLIC PANEL
ALT: 20 U/L (ref 0–44)
AST: 21 U/L (ref 15–41)
Albumin: 3.8 g/dL (ref 3.5–5.0)
Alkaline Phosphatase: 78 U/L (ref 38–126)
Anion gap: 7 (ref 5–15)
BUN: 17 mg/dL (ref 6–20)
CO2: 30 mmol/L (ref 22–32)
Calcium: 9.3 mg/dL (ref 8.9–10.3)
Chloride: 99 mmol/L (ref 98–111)
Creatinine, Ser: 1.13 mg/dL (ref 0.61–1.24)
GFR, Estimated: >60 mL/min (ref >60)
Glucose, Bld: 95 mg/dL (ref 70–99)
Potassium: 4.1 mmol/L (ref 3.5–5.1)
Sodium: 136 mmol/L (ref 135–145)
Total Bilirubin: 0.5 mg/dL (ref 0.3–1.2)
Total Protein: 7.6 g/dL (ref 6.5–8.1)

## 2021-06-26 LAB — DIFFERENTIAL
Abs Immature Granulocytes: 0.04 10*3/uL (ref 0.00–0.07)
Basophils Absolute: 0 10*3/uL (ref 0.0–0.1)
Basophils Relative: 0 %
Eosinophils Absolute: 0.1 10*3/uL (ref 0.0–0.5)
Eosinophils Relative: 1 %
Immature Granulocytes: 1 %
Lymphocytes Relative: 37 %
Lymphs Abs: 2.8 10*3/uL (ref 0.7–4.0)
Monocytes Absolute: 0.5 10*3/uL (ref 0.1–1.0)
Monocytes Relative: 7 %
Neutro Abs: 4.1 10*3/uL (ref 1.7–7.7)
Neutrophils Relative %: 54 %

## 2021-06-26 LAB — CBC
HCT: 47.3 % (ref 39.0–52.0)
Hemoglobin: 15.3 g/dL (ref 13.0–17.0)
MCH: 30.3 pg (ref 26.0–34.0)
MCHC: 32.3 g/dL (ref 30.0–36.0)
MCV: 93.7 fL (ref 80.0–100.0)
Platelets: 212 10*3/uL (ref 150–400)
RBC: 5.05 MIL/uL (ref 4.22–5.81)
RDW: 13.4 % (ref 11.5–15.5)
WBC: 7.6 10*3/uL (ref 4.0–10.5)
nRBC: 0 % (ref 0.0–0.2)

## 2021-06-26 LAB — PROTIME-INR
INR: 0.9 (ref 0.8–1.2)
Prothrombin Time: 12.5 seconds (ref 11.4–15.2)

## 2021-06-26 LAB — APTT: aPTT: 32 seconds (ref 24–36)

## 2021-06-26 MED ORDER — SODIUM CHLORIDE 0.9 % IV BOLUS
1000.0000 mL | Freq: Once | INTRAVENOUS | Status: AC
  Administered 2021-06-26: 1000 mL via INTRAVENOUS

## 2021-06-26 MED ORDER — KETOROLAC TROMETHAMINE 30 MG/ML IJ SOLN
30.0000 mg | Freq: Once | INTRAMUSCULAR | Status: AC
  Administered 2021-06-26: 30 mg via INTRAVENOUS
  Filled 2021-06-26: qty 1

## 2021-06-26 MED ORDER — ACETAMINOPHEN 500 MG PO TABS
1000.0000 mg | ORAL_TABLET | Freq: Once | ORAL | Status: AC
  Administered 2021-06-26: 1000 mg via ORAL
  Filled 2021-06-26: qty 2

## 2021-06-26 MED ORDER — MECLIZINE HCL 25 MG PO TABS
25.0000 mg | ORAL_TABLET | Freq: Once | ORAL | Status: AC
  Administered 2021-06-26: 25 mg via ORAL
  Filled 2021-06-26: qty 1

## 2021-06-26 MED ORDER — MECLIZINE HCL 25 MG PO TABS: 25.0000 mg | ORAL_TABLET | Freq: Three times a day (TID) | ORAL | 0 refills | Status: DC | PRN

## 2021-06-26 NOTE — ED Notes (Signed)
Pt will be transported to room 7 after CT.

## 2021-06-26 NOTE — Progress Notes (Signed)
   CC: dizziness, headache, vision changes  HPI:  Mr.Toretto F Ferreras is a 55 y.o. male with past medical history as stated below presenting for evaluation of dizziness for approximately 1 month duration that has progressively worsened this morning, throbbing headache, blurry vision with floaters.  Patient also notes difficulty walking.  Please see problem based charting for complete assessment plan  Past Medical History:  Diagnosis Date   Acute pancreatitis    Anxiety    Arthritis    Colonic adenoma    COPD (chronic obstructive pulmonary disease) (HCC)    Depression    Diverticulitis of colon with perforation 08/2010   GERD (gastroesophageal reflux disease)    History of hiatal hernia    Hypertension    Polysubstance abuse ([place])    Renal insufficiency    Sleep apnea    Intolerant of CPAP   TIA (transient ischemic attack)    Review of Systems: Negative except as stated in HPI.  Physical Exam:  Vitals:   06/26/21 1543  BP: (!) 136/101  Pulse: 69  Resp: (!) 32  Temp: 97.7 F (36.5 C)  TempSrc: Oral  SpO2: 94%  Weight: 214 lb 8 oz (97.3 kg)  Height: 5\' 6"  (1.676 m)   Orthostatic VS for the past 24 hrs:  BP- Lying Pulse- Lying BP- Sitting Pulse- Sitting BP- Standing at 0 minutes Pulse- Standing at 0 minutes  06/26/21 1551 131/90 69 (!) 130/103 69 125/74 75     Physical Exam  Constitutional: Middle-age male, appears older than stated age. well-developed and well-nourished. No distress.  HENT: Normocephalic and atraumatic, EOMI, conjunctiva normal, moist mucous membranes Cardiovascular: Normal rate, regular rhythm, S1 and S2 present, no murmurs, rubs, gallops.  Distal pulses intact Respiratory:  Effort is normal.  Lungs are clear to auscultation bilaterally. GI: Distended, soft, nontender to palpation, normal bowel sounds; right lower quadrant with surgical scar Musculoskeletal: Normal bulk and tone.  No peripheral edema noted. Neurological: Is alert and oriented x4, CN  II through XII grossly intact, no nystagmus appreciated, bilateral upper and lower extremity strength 5/5.  Significant gait instability.  Finger-to-nose test abnormal.  No dysdiadochokinesia Skin: Warm and dry.  No rash, erythema, lesions noted. Psychiatric: Normal mood and affect. Behavior is normal. Judgment and thought content normal.    Assessment & Plan:   See Encounters Tab for problem based charting.  Patient seen with Dr. Heber [place]

## 2021-06-26 NOTE — Assessment & Plan Note (Addendum)
BP Readings from Last 3 Encounters:  06/26/21 (!) 130/100  06/26/21 (!) 136/101  05/30/21 133/88   Patient is currently on hydrochlorothiazide 25 mg and clonidine 0.2 mg daily.  Denies any missed doses.  Currently does endorse headache and dizziness/lightheadedness with 2 presyncopal episodes.  Denies any chest pain, shortness of breath, focal weakness.  Blood pressure above goal and negative orthostatics.  Plan Continue current regimen Follow-up at next visit

## 2021-06-26 NOTE — Telephone Encounter (Signed)
Received TC from patient who states he has been having dizzy spells for 1 month now, states they are worse today.  He denies chest pain, SOB, or any other symptoms.  Appt made for today at 1515 with blue team/ Dr. Marva Panda and patient instructed to have someone drive him to his appt. SChaplin, RN,BSN

## 2021-06-26 NOTE — ED Notes (Signed)
Pt encouraged to ambulate per PA. Pt ambulated well. No complaints of dizziness.

## 2021-06-26 NOTE — Discharge Instructions (Addendum)
You came to the emergency department today to be evaluated for your dizziness.  Your CT scan and MRI showed no acute intracranial abnormalities or strokes.  I have given you prescription for meclizine.  You may use this as prescribed for dizziness.  Please follow-up with your primary care doctor.  Get help right away if: You vomit or have diarrhea and are unable to eat or drink anything. You have problems talking, walking, swallowing, or using your arms, hands, or legs. You feel generally weak. You have any bleeding. You are not thinking clearly or you have trouble forming sentences. It may take a friend or family member to notice this. You have chest pain, abdominal pain, shortness of breath, or sweating. Your vision changes or you develop a severe headache.

## 2021-06-26 NOTE — ED Notes (Signed)
Patient transported to MRI 

## 2021-06-26 NOTE — ED Provider Notes (Signed)
  Care of patient assumed from [place] at 1900.  Agree with history, physical exam and plan.  See their note for further details. Briefly, patient with a history of COPD, TIA, vertigo, and hypertension presents emergency department complaint dizziness.  Patient reports he has been having dizziness over the last month.  Dizziness began when patient started taking Flomax.  Patient stopped medication 2 weeks prior and has not had any dizziness until today.  Physical Exam  BP (!) 126/93   Pulse 62   Temp 97.8 F (36.6 C) (Oral)   Resp (!) 9   SpO2 92%   Physical Exam Vitals and nursing note reviewed.  Constitutional:      General: He is not in acute distress.    Appearance: He is not ill-appearing, toxic-appearing or diaphoretic.  HENT:     Head: Normocephalic.  Eyes:     General: No scleral icterus.       Right eye: No discharge.        Left eye: No discharge.  Cardiovascular:     Rate and Rhythm: Normal rate.  Pulmonary:     Effort: Pulmonary effort is normal.  Skin:    General: Skin is warm and dry.  Neurological:     General: No focal deficit present.     Mental Status: He is alert and oriented to person, place, and time.     GCS: GCS eye subscore is 4. GCS verbal subscore is 5. GCS motor subscore is 6.     Comments: CN II through XII intact.  +5 strength to bilateral upper and lower extremities.  Grip strength equal bilaterally.  Sensation to light touch intact to bilateral upper and lower extremities.  Patient has difficulty with finger-nose and heel-to-shin.  Psychiatric:        Behavior: Behavior is cooperative.    ED Course/Procedures     Procedures  MDM   At time of handoff MRI pending.  We will give patient meclizine to see if there is any improvement in his dizziness.  After receiving meclizine patient reports resolution of dizziness.  Patient able to stand and ambulate without difficulty or complaints of dizziness.  Patient reports headache at this time.   Patient was offered migraine cocktail however defers due to its sedating effects.  We will give patient Tylenol.  Patient to follow-up with primary care provider.  We will give patient a short course of meclizine.  Discussed results, findings, treatment and follow up. Patient advised of return precautions. Patient verbalized understanding and agreed with plan.      Dyann Ruddle 06/26/21 2104    Davonna Belling, MD 06/26/21 2212

## 2021-06-26 NOTE — Assessment & Plan Note (Addendum)
[name] is presenting for evaluation of dizziness/lightheadedness, headache, vision changes.  He endorses that the dizziness/lightheadedness has been present for at least 1 month but has progressively worsened.  This morning, notes significant dizziness with 2 presyncopal episodes.  He endorses a throbbing headache and blurry vision.  He also endorses seeing floaters in his vision earlier in the day.  He reports prior history of TIA and reports current visit feels similar to that. On examination, patient has significant gait instability.  Manger of neurological exam is nonfocal but patient does struggle with anger to nose testing; however, no dysdiadochokinesia appreciated.  Orthostatics negative. 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  However, given intermittent headaches and ongoing blurry vision and significant gait instability, cannot rule out stroke. Patient recommended to present to the ED for further stroke work-up with CT head and MRI brain.

## 2021-06-26 NOTE — ED Provider Notes (Signed)
Emergency Medicine Provider Triage Evaluation Note  [name] , a 55 y.o. male  was evaluated in triage.  Pt complains of persistent headache and dizziness that started at 0 930 this morning.  Patient denied any symptoms upon first waking up.  Denies any numbness, weakness, facial asymmetry, dysarthria.    Review of Systems  Positive: Headache, dizziness Negative: Visual disturbance, numbness, weakness, facial asymmetry, dysarthria  Physical Exam  BP (!) 130/100 (BP Location: Right Arm)   Pulse 68   Temp 97.8 F (36.6 C) (Oral)   Resp 18   SpO2 95%  Gen:   Awake, no distress   Resp:  Normal effort  MSK:   Moves extremities without difficulty  Other:  No facial asymmetry or dysarthria.  Good strength equal. negative pronator drift.  Patient moves all limbs equally without difficulty  Medical Decision Making  Medically screening exam initiated at 4:49 PM.  Appropriate orders placed.  YOTAM RHINE was informed that the remainder of the evaluation will be completed by another provider, this initial triage assessment does not replace that evaluation, and the importance of remaining in the ED until their evaluation is complete.     Dyann Ruddle 06/26/21 1650    Davonna Belling, MD 06/26/21 2212

## 2021-06-26 NOTE — ED Notes (Signed)
Pt verbalizes understanding of discharge instructions. Opportunity for questions and answers were provided. Pt discharged from the ED.   ?

## 2021-06-26 NOTE — Patient Instructions (Signed)
Mr [name],  It was a pleasure seeing you in clinic. Today we discussed:   Dizziness: At this time, we recommend further evaluation in the ED to rule out stroke.  Once this is ruled out, we can continue further work up for BPPV.   If you have any questions or concerns, please call our clinic at [phone] between 9am-5pm and after hours call [phone] and ask for the internal medicine resident on call. If you feel you are having a medical emergency please call 911.   Thank you, we look forward to helping you remain healthy!

## 2021-06-26 NOTE — ED Provider Notes (Signed)
[facility] EMERGENCY DEPARTMENT Provider Note   CSN: [id number] Arrival date & time: 06/26/21  1642     History Chief Complaint  Patient presents with   Dizziness    [name] is a 55 y.o. male with a past medical history of COPD, TIA, vertigo and hypertension presenting today with a complaint of dizziness.  Patient reports he has been dizzy for the past month.  When this dizziness began, he had started Flomax.  Patient stopped taking this 2 weeks ago because it was making him dizzy.  He did not have any dizzy spells until today.  Unable to say what makes it worse.  Nothing has made it better.  He no longer takes meclizine, reporting that he was taken off of it.  He presented to his primary care provider earlier today and they sent him to the emergency department for further work-up and potentially an MRI due to his he complaints of headaches, occasional blurry vision and gait instability.    Past Medical History:  Diagnosis Date   Acute pancreatitis    Anxiety    Arthritis    Colonic adenoma    COPD (chronic obstructive pulmonary disease) (HCC)    Depression    Diverticulitis of colon with perforation 08/2010   GERD (gastroesophageal reflux disease)    History of hiatal hernia    Hypertension    Polysubstance abuse ([place])    Renal insufficiency    Sleep apnea    Intolerant of CPAP   TIA (transient ischemic attack)     Patient Active Problem List   Diagnosis Date Noted   Gout 05/24/2021   Bilateral hand swelling 04/09/2021   Epidermoid cyst of skin 02/18/2021   Erectile disorder due to medical condition in male 01/10/2021   Left knee pain 12/11/2020   Hematuria 12/11/2020   Vitamin D deficiency 10/12/2020   Opioid use disorder, mild, abuse ([place]) 08/16/2020   Dizziness 05/23/2020   Cervical radiculopathy    Tobacco abuse    Hyperlipidemia    Hemorrhoids 12/06/2019   Chronic obstructive pulmonary disease ([place]) 12/14/2017   Other dysphagia  09/24/2017   GERD (gastroesophageal reflux disease) 03/15/2012   Gastritis and duodenitis 03/15/2012   Depression 10/05/2009   HTN (hypertension) 10/05/2009    Past Surgical History:  Procedure Laterality Date   APPENDECTOMY     BIOPSY  12/14/2017   Procedure: BIOPSY;  Surgeon: Daneil Dolin, MD;  [facility];  Service: Endoscopy;;  gastric    BIOPSY  10/13/2019   Procedure: BIOPSY;  Surgeon: Daneil Dolin, MD;  [facility];  Service: Endoscopy;;  esophagus   BIOPSY  05/20/2021   Procedure: BIOPSY;  Surgeon: Daneil Dolin, MD;  [facility];  Service: Endoscopy;;   clamp left in colon after abd surgery     COLON SURGERY  05/2011   [facility]. exp laparotomy with colostomy takedown, lysis of adhesions (3hours), loop ileostomy. complicated by abscess formation requiring percutaneous drainage   colonoscopy via stoma with endoscopy of [name]  04/2011   [facility]. two sessile polyps in trv colon. complete resection but only partial retrieval. congested mucosa in area 3cm proximal to stoma, erythematous mucosa in Hartmann pouch. repeat tcs 8/2017path-adenomatous polyp   COLONOSCOPY WITH PROPOFOL N/A 12/14/2017   Dr. Gala Romney: 2 4 to 6 mm polyps in the cecum, tubular adenomas, single rectal polyp tubular adenoma, nonbleeding internal hemorrhoids.  Appears to be a surgical clip at the appendiceal  orifice.  Next colonoscopy in 5 years   ESOPHAGOGASTRODUODENOSCOPY  03/05/2012   erosive reflux esophagitis, bulbar erosions, gastric erosions/petichae, bx showed gastritis, no H.Pylori. Procedure: ESOPHAGOGASTRODUODENOSCOPY (EGD);  Surgeon: Daneil Dolin, MD;  [facility];  Service: Endoscopy;  Laterality: N/A;  11:30   ESOPHAGOGASTRODUODENOSCOPY (EGD) WITH PROPOFOL N/A 12/14/2017   Dr. Gala Romney: Erosive reflux esophagitis with questionable mild stricturing/non-obstruction at this level status post dilation.  multiple gastric erosions in the stomach biopsy showed  reactive gastropathy but no H. pylori.   ESOPHAGOGASTRODUODENOSCOPY (EGD) WITH PROPOFOL N/A 10/13/2019   negative Barrett's, normal stomach   ESOPHAGOGASTRODUODENOSCOPY (EGD) WITH PROPOFOL N/A 05/20/2021   Procedure: ESOPHAGOGASTRODUODENOSCOPY (EGD) WITH PROPOFOL;  Surgeon: Daneil Dolin, MD;  [facility];  Service: Endoscopy;  Laterality: N/A;  3:00pm   HERNIA REPAIR     KNEE SURGERY Right 2006   MALONEY DILATION N/A 12/14/2017   Procedure: Venia Minks DILATION;  Surgeon: Daneil Dolin, MD;  [facility];  Service: Endoscopy;  Laterality: N/A;   MALONEY DILATION  05/20/2021   Procedure: Venia Minks DILATION;  Surgeon: Daneil Dolin, MD;  [facility];  Service: Endoscopy;;   MULTIPLE EXTRACTIONS WITH ALVEOLOPLASTY  02/23/2012   Procedure: MULTIPLE EXTRACION WITH ALVEOLOPLASTY;  Surgeon: Gae Bon, DDS;  Location: [facility];  Service: Oral Surgery;  Laterality: Bilateral;  Removal of left mandibular torus, multiple extractions with alveoloplasty   POLYPECTOMY  12/14/2017   Procedure: POLYPECTOMY;  Surgeon: Daneil Dolin, MD;  [facility];  Service: Endoscopy;;   reversal of colostomy  10/2011   per patient, did not have report   right knee surgery     sigmoid colectomy with end colostomy, Hartmann's pouch  08/2010   [facility]. complicated diverticulitis   UMBILICAL HERNIA REPAIR         Family History  Problem Relation Age of Onset   Stomach cancer Father    Lung cancer Father    Throat cancer Father    Cancer Father        lung and throat cancer   Hypertension Father    Liver disease Cousin        cirrhosis, etoh   Throat cancer Paternal Uncle    Lung cancer Paternal Uncle    Colon cancer Cousin 86   Heart disease Mother    Hypertension Mother    Colon cancer Maternal Uncle    Throat cancer Paternal Uncle    Lung cancer Paternal Uncle    Throat cancer Paternal Uncle    Lung cancer Paternal Uncle     Social History   Tobacco Use    Smoking status: Every Day    Packs/day: 1.00    Years: 37.00    Pack years: 37.00    Types: Cigarettes   Smokeless tobacco: Former    Types: Snuff   Tobacco comments:    Trying to quit, has nicotine patches at home which have helped him quit before  Vaping Use   Vaping Use: Never used  Substance Use Topics   Alcohol use: Not Currently   Drug use: Yes    Frequency: 2.0 times per week    Types: Marijuana, Benzodiazepines    Comment: Marijuana last used 05/19/21    Home Medications Prior to Admission medications   Medication Sig Start Date End Date Taking? Authorizing Provider  albuterol (PROVENTIL HFA;VENTOLIN HFA) 108 (90 Base) MCG/ACT inhaler Inhale 2 puffs into the lungs every 6 (six) hours as needed  for wheezing or shortness of breath.    [provider]  aspirin EC 81 MG EC tablet Take 1 tablet (81 mg total) by mouth daily. Swallow whole. 04/27/20   Barton Dubois, MD  Buprenorphine HCl-Naloxone HCl (SUBOXONE) 8-2 MG FILM Place 1 Film under the tongue in the morning and at bedtime. 06/05/21   Axel Filler, MD  Cholecalciferol 25 MCG (1000 UT) TBDP Take 25 mcg by mouth daily. Patient taking differently: Take 1,000 Units by mouth daily. 10/12/20   Cato Mulligan, MD  cloNIDine (CATAPRES) 0.1 MG tablet Take 2 tablets by mouth twice daily 05/14/21   Rehman, Areeg N, DO  diclofenac Sodium (VOLTAREN) 1 % GEL Apply 2 g topically 4 (four) times daily. Patient taking differently: Apply 2 g topically 2 (two) times daily as needed (pain). 03/18/21   Atway, Rayann N, DO  ezetimibe (ZETIA) 10 MG tablet Take 1 tablet (10 mg total) by mouth daily. Patient taking differently: Take 10 mg by mouth 3 (three) times a week. 10/11/20 10/11/21  Cato Mulligan, MD  gabapentin (NEURONTIN) 300 MG capsule Take 300 mg by mouth at bedtime as needed (pain).    [provider]  hydrochlorothiazide (HYDRODIURIL) 25 MG tablet Take 1 tablet (25 mg total) by mouth daily. 05/27/21   Angelica Pou, MD  hydrocortisone (ANUSOL-HC) 2.5 % rectal cream Place 1 application rectally 2 (two) times daily. Patient taking differently: Place 1 application rectally 2 (two) times daily as needed for hemorrhoids. 03/21/21   Carlan, Chelsea L, NP  pantoprazole (PROTONIX) 40 MG tablet Take 1 tablet by mouth once daily Patient taking differently: Take 40 mg by mouth 2 (two) times daily as needed (acid reflux). 04/29/21   Rehman, Areeg N, DO  sildenafil (VIAGRA) 100 MG tablet Take 1 tablet (100 mg total) by mouth as needed for erectile dysfunction. 04/09/21 04/09/22  Rehman, Areeg N, DO  traZODone (DESYREL) 50 MG tablet Take 1 tablet (50 mg total) by mouth at bedtime. Patient not taking: No sig reported 04/09/21 07/08/21  Rehman, Areeg N, DO    Allergies    Codeine, Latex, Lisinopril, Morphine, and Adhesive [tape]  Review of Systems   Review of Systems  Constitutional:  Negative for chills and fever.  HENT:  Positive for tinnitus.   Eyes:  Positive for visual disturbance.  Gastrointestinal:  Negative for nausea and vomiting.  Neurological:  Positive for dizziness, weakness and headaches. Negative for syncope and numbness.  Psychiatric/Behavioral:  Negative for confusion.    Physical Exam Updated Vital Signs BP 137/89   Pulse 64   Temp 97.8 F (36.6 C) (Oral)   Resp 16   SpO2 94%   Physical Exam Vitals and nursing note reviewed.  Constitutional:      Appearance: Normal appearance.  HENT:     Head: Normocephalic and atraumatic.     Right Ear: Tympanic membrane, ear canal and external ear normal.     Left Ear: Tympanic membrane, ear canal and external ear normal.  Eyes:     General: No scleral icterus.    Conjunctiva/sclera: Conjunctivae normal.  Cardiovascular:     Rate and Rhythm: Normal rate and regular rhythm.  Pulmonary:     Effort: Pulmonary effort is normal. No respiratory distress.     Breath sounds: Wheezing (upper lung fields) present.  Musculoskeletal:     Cervical  back: Normal range of motion.  Skin:    General: Skin is warm and dry.     Findings: No rash.  Neurological:     Mental Status: He is alert and oriented to person, place, and time.     Cranial Nerves: No cranial nerve deficit (2-12 intact).     Motor: Weakness (Decreased finger grip bilaterally) present.     Comments: No nystagmus on HINTS exam.  Patient with difficulty with finger to nose and heel to shin testing bilaterally. No facial droop or sensation deficits.   Psychiatric:        Mood and Affect: Mood normal.        Behavior: Behavior normal.    ED Results / Procedures / Treatments   Labs (all labs ordered are listed, but only abnormal results are displayed) Labs Reviewed  PROTIME-INR  APTT  CBC  DIFFERENTIAL  COMPREHENSIVE METABOLIC PANEL    EKG None  Radiology CT HEAD WO CONTRAST (5MM)  Result Date: 06/26/2021 CLINICAL DATA:  Persistent headache, dizziness. Neuro deficit, acute, stroke suspected EXAM: CT HEAD WITHOUT CONTRAST TECHNIQUE: Contiguous axial images were obtained from the base of the skull through the vertex without intravenous contrast. COMPARISON:  04/25/2020 FINDINGS: Brain: No acute intracranial abnormality. Specifically, no hemorrhage, hydrocephalus, mass lesion, acute infarction, or significant intracranial injury. Vascular: No hyperdense vessel or unexpected calcification. Skull: No acute calvarial abnormality. Sinuses/Orbits: No acute findings Other: None IMPRESSION: No acute intracranial abnormality. Electronically Signed   By: Rolm Baptise M.D.   On: 06/26/2021 17:28    Procedures Procedures   Medications Ordered in ED Medications  sodium chloride 0.9 % bolus 1,000 mL (has no administration in time range)  ketorolac (TORADOL) 30 MG/ML injection 30 mg (has no administration in time range)    ED Course  I have reviewed the triage vital signs and the nursing notes.  Pertinent labs & imaging results that were available during my care of the  patient were reviewed by me and considered in my medical decision making (see chart for details).    MDM Rules/Calculators/A&P I evaluated the patient at the bedside.  He was alert and oriented however struggled with cerebellar testing. Ear exam was normal, no signs of masses or lesions.  I did not ambulate the patient at this time because an MRI will be ordered regardless.  MRI pending at this time.  Patient has been given fluids and pain medication to help with his current discomfort.  I suspect the patient will be discharged if he has a normal MRI and is able to ambulate.  If patient is unable to ambulate or has an abnormal test I suspect he may be admitted or assessed by PT/OT. Patient signed out at shift change, please see oncoming PA's note for further eval.   Final Clinical Impression(s) / ED Diagnoses Final diagnoses:  None    Rx / DC Orders Patient signed out to [place], [place], at shift change. See his note for further evaluation and ultimate dispo.     Darliss Ridgel 06/26/21 1913    Davonna Belling, MD 06/26/21 2212

## 2021-06-26 NOTE — ED Triage Notes (Signed)
Patient sent to ED from internal medicine for evaluation of persistent headache and dizziness that started at 0930 this morning. No arm drift, no facial droop, speech is clear, patient is alert and oriented x4.

## 2021-07-02 NOTE — Progress Notes (Signed)
Internal Medicine Clinic Attending  I saw and evaluated the patient.  I personally confirmed the key portions of the history and exam documented by Dr. Aslam and I reviewed pertinent patient test results.  The assessment, diagnosis, and plan were formulated together and I agree with the documentation in the resident's note.     

## 2021-07-22 ENCOUNTER — Other Ambulatory Visit: Payer: Self-pay | Admitting: Internal Medicine

## 2021-07-22 DIAGNOSIS — I10 Essential (primary) hypertension: Secondary | ICD-10-CM

## 2021-07-29 ENCOUNTER — Encounter: Payer: Self-pay | Admitting: Student

## 2021-07-29 ENCOUNTER — Other Ambulatory Visit: Payer: Self-pay

## 2021-07-29 ENCOUNTER — Ambulatory Visit (INDEPENDENT_AMBULATORY_CARE_PROVIDER_SITE_OTHER): Payer: 59 | Admitting: Student

## 2021-07-29 VITALS — BP 137/100 | HR 76 | Temp 97.5°F | Ht 67.0 in | Wt 207.2 lb

## 2021-07-29 DIAGNOSIS — M79672 Pain in left foot: Secondary | ICD-10-CM

## 2021-07-29 DIAGNOSIS — F1110 Opioid abuse, uncomplicated: Secondary | ICD-10-CM | POA: Diagnosis not present

## 2021-07-29 DIAGNOSIS — M79671 Pain in right foot: Secondary | ICD-10-CM | POA: Diagnosis not present

## 2021-07-29 DIAGNOSIS — Z23 Encounter for immunization: Secondary | ICD-10-CM | POA: Diagnosis not present

## 2021-07-29 DIAGNOSIS — R42 Dizziness and giddiness: Secondary | ICD-10-CM

## 2021-07-29 DIAGNOSIS — Z0000 Encounter for general adult medical examination without abnormal findings: Secondary | ICD-10-CM

## 2021-07-29 DIAGNOSIS — F32A Depression, unspecified: Secondary | ICD-10-CM | POA: Diagnosis not present

## 2021-07-29 MED ORDER — SERTRALINE HCL 25 MG PO TABS: 25.0000 mg | ORAL_TABLET | Freq: Every day | ORAL | 2 refills | Status: DC

## 2021-07-29 MED ORDER — SERTRALINE HCL 50 MG PO TABS: 50.0000 mg | ORAL_TABLET | Freq: Every day | ORAL | 2 refills | Status: DC

## 2021-07-29 NOTE — Assessment & Plan Note (Signed)
Patient has trialed multiple antidepressants as well as medications to help with anxiety.  He has been self-medicating with Xanax as given from family members.  Discussed with him that feel as though this is unsafe especially considering he was on Suboxone treatment.  We discussed starting him on alternative regimens and he would like to try Zoloft.  We will start at 25 mg daily and uptitrate as needed.  Discussed with patient that this can take up to 4 weeks to work and that if he notices any side effects to please call our clinic.

## 2021-07-29 NOTE — Assessment & Plan Note (Signed)
Assessment: Patient follows with opioid use disorder clinic, current regimen of Suboxone 1 film twice daily.  He endorses taking 1 5 mg pain medication from a family member as he was continue to have heel pain.  He denies it helping.  He also endorses receiving Xanax from family members as he has a severe history of anxiety.  Discussed with him that due to him being on a controlled substance contract and being a part of our Suboxone clinic I recommend that he not continue to take pain medication from others and we also discussed his Xanax use.  He states that he has stopped the Xanax for few days and had no withdrawal symptoms.  We will start him on Zoloft, he endorses trialing other medications such as Wellbutrin and duloxetine.  We will perform U tox today and have follow-up in 1 month.  Patient would like refill of Suboxone at this time.  This was discussed with attending Dr. Heber [place] who will refill accordingly  Plan: -Refill Suboxone, 1 film twice daily -Continue to work with patient for anxiety and using other substances purchased off the streets.

## 2021-07-29 NOTE — Progress Notes (Signed)
CC: Bilateral heel pain  HPI:  [name] is a 55 y.o. male with a past medical history stated below and presents today for bilateral heel pain. Please see problem based assessment and plan for additional details.  Past Medical History:  Diagnosis Date   Acute pancreatitis    Anxiety    Arthritis    Colonic adenoma    COPD (chronic obstructive pulmonary disease) (HCC)    Depression    Diverticulitis of colon with perforation 08/2010   GERD (gastroesophageal reflux disease)    History of hiatal hernia    Hypertension    Polysubstance abuse ([place])    Renal insufficiency    Sleep apnea    Intolerant of CPAP   TIA (transient ischemic attack)     Current Outpatient Medications on File Prior to Visit  Medication Sig Dispense Refill   albuterol (PROVENTIL HFA;VENTOLIN HFA) 108 (90 Base) MCG/ACT inhaler Inhale 2 puffs into the lungs every 6 (six) hours as needed for wheezing or shortness of breath.     aspirin EC 81 MG EC tablet Take 1 tablet (81 mg total) by mouth daily. Swallow whole. 30 tablet 11   Buprenorphine HCl-Naloxone HCl (SUBOXONE) 8-2 MG FILM Place 1 Film under the tongue in the morning and at bedtime. 60 each 1   Cholecalciferol 25 MCG (1000 UT) TBDP Take 25 mcg by mouth daily. (Patient taking differently: Take 1,000 Units by mouth daily.) 60 tablet 0   cloNIDine (CATAPRES) 0.1 MG tablet Take 2 tablets (0.2 mg total) by mouth 2 (two) times daily. 360 tablet 0   diclofenac Sodium (VOLTAREN) 1 % GEL Apply 2 g topically 4 (four) times daily. (Patient taking differently: Apply 2 g topically 2 (two) times daily as needed (pain).) 2 g 1   ezetimibe (ZETIA) 10 MG tablet Take 1 tablet (10 mg total) by mouth daily. (Patient taking differently: Take 10 mg by mouth 3 (three) times a week.) 30 tablet 11   gabapentin (NEURONTIN) 300 MG capsule Take 300 mg by mouth at bedtime as needed (pain).     hydrochlorothiazide (HYDRODIURIL) 25 MG tablet Take 1 tablet (25 mg total) by mouth  daily. 90 tablet 3   hydrocortisone (ANUSOL-HC) 2.5 % rectal cream Place 1 application rectally 2 (two) times daily. (Patient taking differently: Place 1 application rectally 2 (two) times daily as needed for hemorrhoids.) 30 g 1   meclizine (ANTIVERT) 25 MG tablet Take 1 tablet (25 mg total) by mouth 3 (three) times daily as needed for dizziness. 30 tablet 0   pantoprazole (PROTONIX) 40 MG tablet Take 1 tablet by mouth once daily (Patient taking differently: Take 40 mg by mouth 2 (two) times daily as needed (acid reflux).) 90 tablet 0   sildenafil (VIAGRA) 100 MG tablet Take 1 tablet (100 mg total) by mouth as needed for erectile dysfunction. 20 tablet 1   No current facility-administered medications on file prior to visit.    Family History  Problem Relation Age of Onset   Stomach cancer Father    Lung cancer Father    Throat cancer Father    Cancer Father        lung and throat cancer   Hypertension Father    Liver disease Cousin        cirrhosis, etoh   Throat cancer Paternal Uncle    Lung cancer Paternal Uncle    Colon cancer Cousin 65   Heart disease Mother    Hypertension Mother  Colon cancer Maternal Uncle    Throat cancer Paternal Uncle    Lung cancer Paternal Uncle    Throat cancer Paternal Uncle    Lung cancer Paternal Uncle     Social History   Socioeconomic History   Marital status: Single    Spouse name: Not on file   Number of children: 0   Years of education: Not on file   Highest education level: Not on file  Occupational History   Not on file  Tobacco Use   Smoking status: Every Day    Packs/day: 1.00    Years: 37.00    Pack years: 37.00    Types: Cigarettes   Smokeless tobacco: Former    Types: Snuff   Tobacco comments:    Trying to quit, has nicotine patches at home which have helped him quit before  Vaping Use   Vaping Use: Never used  Substance and Sexual Activity   Alcohol use: Not Currently   Drug use: Yes    Frequency: 2.0 times per  week    Types: Marijuana, Benzodiazepines    Comment: Marijuana last used 05/19/21   Sexual activity: Not on file  Other Topics Concern   Not on file  Social History Narrative   Not on file   Social Determinants of Health   Financial Resource Strain: Not on file  Food Insecurity: Not on file  Transportation Needs: Not on file  Physical Activity: Not on file  Stress: Not on file  Social Connections: Not on file  Intimate Partner Violence: Not on file    Review of Systems: ROS negative except for what is noted on the assessment and plan.  Vitals:   07/29/21 1425  BP: (!) 137/100  Pulse: 76  Temp: (!) 97.5 F (36.4 C)  TempSrc: Oral  SpO2: 97%  Weight: 207 lb 3.2 oz (94 kg)  Height: 5\' 7"  (1.702 m)     Physical Exam: Constitutional: well-appearing, no acute distress HENT: normocephalic atraumatic, mucous membranes moist Eyes: conjunctiva non-erythematous Neck: supple Cardiovascular: regular rate and rhythm, no m/r/g Pulmonary/Chest: normal work of breathing on room air, lungs clear to auscultation bilaterally MSK: normal bulk and tone.  Tender to palpate on hindfoot Neurological: alert & oriented x 3, normal sensation lower extremities Skin: warm and dry Psych: Normal mood and thought process   Assessment & Plan:   See Encounters Tab for problem based charting.  Patient discussed with Dr. Delano Metz, D.O. [facility], PGY-2 Pager: [phone], Phone: [phone] Date 07/29/2021 Time 4:12 PM

## 2021-07-29 NOTE — Assessment & Plan Note (Signed)
Assessment: Patient with bilateral heel pain for the past 3 weeks.  He describes pain as a sharp pain that is worse when he walks barefoot and improves when he wear shoes.  He bought new tennis shoes however he continues to have the pain.  He denies radiation of the pain anywhere.  Denies any burning sensation in his feet.  He endorses possibly having this pain before but he is uncertain. He denies any trauma to the foot.  He is uncertain what he was doing when it started on physical exam there is no obvious deformities of the foot.  No erythema, ecchymosis, wound.   Due to the location differential is more consistent with possible bone spurs.  We will perform bilateral foot x-rays and refer to podiatry.  Also on differential but less likely as plantar fasciitis.  Plan: -Bilateral foot x-ray, referral to podiatry -Tylenol for any continued pain

## 2021-07-29 NOTE — Patient Instructions (Signed)
Thank you, [name] for allowing [place] to provide your care today. Today we discussed .  Bilateral heel pain We will be getting x-rays of your heels and referring you to podiatry.  If you have continued plan please take Tylenol as needed.  Suboxone use We will be refilling her Suboxone today and getting a urine drug screen.   Anxiety We will be starting you on Zoloft 25 mg daily.  Please continue to take this for 4 weeks even if you feel that you are not working.  It takes a few weeks to kick in.  We will consider increasing the dose at your follow-up in 1 month.  I have ordered the following labs for you:   Lab Orders         ToxAssure Select,+Antidepr,UR      Referrals ordered today:    Referral Orders         Ambulatory referral to Podiatry      I have ordered the following medication/changed the following medications:   Stop the following medications: Medications Discontinued During This Encounter  Medication Reason   traZODone (DESYREL) 50 MG tablet      Start the following medications: Meds ordered this encounter  Medications   sertraline (ZOLOFT) 50 MG tablet    Sig: Take 1 tablet (50 mg total) by mouth daily.    Dispense:  30 tablet    Refill:  2     Follow up: 1 month follow up starting new medication  Remember:  Should you have any questions or concerns please call the internal medicine clinic at [phone].    Sanjuana Letters, D.O. [place]

## 2021-07-29 NOTE — Assessment & Plan Note (Signed)
Patient to receive influenza vaccination

## 2021-07-29 NOTE — Assessment & Plan Note (Signed)
Patient states his dizziness improved once he started taking his blood pressure medication after work when he got home.  Denies any further episodes at this time.

## 2021-07-31 NOTE — Progress Notes (Signed)
Internal Medicine Clinic Attending  Case discussed with Dr. Katsadouros  At the time of the visit.  We reviewed the resident's history and exam and pertinent patient test results.  I agree with the assessment, diagnosis, and plan of care documented in the resident's note.  

## 2021-08-06 ENCOUNTER — Ambulatory Visit (INDEPENDENT_AMBULATORY_CARE_PROVIDER_SITE_OTHER): Payer: 59

## 2021-08-06 ENCOUNTER — Other Ambulatory Visit: Payer: Self-pay | Admitting: Podiatry

## 2021-08-06 ENCOUNTER — Encounter: Payer: Self-pay | Admitting: Podiatry

## 2021-08-06 ENCOUNTER — Ambulatory Visit (INDEPENDENT_AMBULATORY_CARE_PROVIDER_SITE_OTHER): Payer: 59 | Admitting: Podiatry

## 2021-08-06 ENCOUNTER — Other Ambulatory Visit: Payer: Self-pay

## 2021-08-06 DIAGNOSIS — M722 Plantar fascial fibromatosis: Secondary | ICD-10-CM | POA: Diagnosis not present

## 2021-08-06 DIAGNOSIS — B070 Plantar wart: Secondary | ICD-10-CM | POA: Diagnosis not present

## 2021-08-06 DIAGNOSIS — M778 Other enthesopathies, not elsewhere classified: Secondary | ICD-10-CM

## 2021-08-06 MED ORDER — METHYLPREDNISOLONE 4 MG PO TBPK: ORAL_TABLET | ORAL | 0 refills | Status: DC

## 2021-08-06 MED ORDER — TRIAMCINOLONE ACETONIDE 40 MG/ML IJ SUSP
40.0000 mg | Freq: Once | INTRAMUSCULAR | Status: AC
  Administered 2021-08-06: 40 mg

## 2021-08-07 LAB — TOXASSURE SELECT,+ANTIDEPR,UR

## 2021-08-07 NOTE — Progress Notes (Signed)
Subjective:  Patient ID: [name], male    DOB: 24-Apr-1966,  MRN: [id number] HPI Chief Complaint  Patient presents with   Foot Pain    Plantar heel bilateral - aching x 2-3 weeks, AM pain, takes Suboxone for stomach pain, but doesn't help feet   New Patient (Initial Visit)    55 y.o. male presents with the above complaint.   ROS: Denies fever chills nausea vomiting muscle aches pains calf pain back pain chest pain shortness of breath  States that he spends a lot of time on a ladder.  He does vinyl siding.  Past Medical History:  Diagnosis Date   Acute pancreatitis    Anxiety    Arthritis    Colonic adenoma    COPD (chronic obstructive pulmonary disease) (HCC)    Depression    Diverticulitis of colon with perforation 08/2010   GERD (gastroesophageal reflux disease)    History of hiatal hernia    Hypertension    Polysubstance abuse ([place])    Renal insufficiency    Sleep apnea    Intolerant of CPAP   TIA (transient ischemic attack)    Past Surgical History:  Procedure Laterality Date   APPENDECTOMY     BIOPSY  12/14/2017   Procedure: BIOPSY;  Surgeon: Daneil Dolin, MD;  [facility];  Service: Endoscopy;;  gastric    BIOPSY  10/13/2019   Procedure: BIOPSY;  Surgeon: Daneil Dolin, MD;  [facility];  Service: Endoscopy;;  esophagus   BIOPSY  05/20/2021   Procedure: BIOPSY;  Surgeon: Daneil Dolin, MD;  [facility];  Service: Endoscopy;;   clamp left in colon after abd surgery     COLON SURGERY  05/2011   [facility]. exp laparotomy with colostomy takedown, lysis of adhesions (3hours), loop ileostomy. complicated by abscess formation requiring percutaneous drainage   colonoscopy via stoma with endoscopy of [name]  04/2011   [facility]. two sessile polyps in trv colon. complete resection but only partial retrieval. congested mucosa in area 3cm proximal to stoma, erythematous mucosa in Hartmann pouch. repeat tcs 8/2017path-adenomatous  polyp   COLONOSCOPY WITH PROPOFOL N/A 12/14/2017   Dr. Gala Romney: 2 4 to 6 mm polyps in the cecum, tubular adenomas, single rectal polyp tubular adenoma, nonbleeding internal hemorrhoids.  Appears to be a surgical clip at the appendiceal orifice.  Next colonoscopy in 5 years   ESOPHAGOGASTRODUODENOSCOPY  03/05/2012   erosive reflux esophagitis, bulbar erosions, gastric erosions/petichae, bx showed gastritis, no H.Pylori. Procedure: ESOPHAGOGASTRODUODENOSCOPY (EGD);  Surgeon: Daneil Dolin, MD;  [facility];  Service: Endoscopy;  Laterality: N/A;  11:30   ESOPHAGOGASTRODUODENOSCOPY (EGD) WITH PROPOFOL N/A 12/14/2017   Dr. Gala Romney: Erosive reflux esophagitis with questionable mild stricturing/non-obstruction at this level status post dilation.  multiple gastric erosions in the stomach biopsy showed reactive gastropathy but no H. pylori.   ESOPHAGOGASTRODUODENOSCOPY (EGD) WITH PROPOFOL N/A 10/13/2019   negative Barrett's, normal stomach   ESOPHAGOGASTRODUODENOSCOPY (EGD) WITH PROPOFOL N/A 05/20/2021   Procedure: ESOPHAGOGASTRODUODENOSCOPY (EGD) WITH PROPOFOL;  Surgeon: Daneil Dolin, MD;  [facility];  Service: Endoscopy;  Laterality: N/A;  3:00pm   HERNIA REPAIR     KNEE SURGERY Right 2006   MALONEY DILATION N/A 12/14/2017   Procedure: Venia Minks DILATION;  Surgeon: Daneil Dolin, MD;  [facility];  Service: Endoscopy;  Laterality: N/A;   MALONEY DILATION  05/20/2021   Procedure: Venia Minks DILATION;  Surgeon: Daneil Dolin, MD;  [facility];  Service: Endoscopy;;   MULTIPLE EXTRACTIONS WITH ALVEOLOPLASTY  02/23/2012   Procedure: MULTIPLE EXTRACION WITH ALVEOLOPLASTY;  Surgeon: Gae Bon, DDS;  Location: [facility];  Service: Oral Surgery;  Laterality: Bilateral;  Removal of left mandibular torus, multiple extractions with alveoloplasty   POLYPECTOMY  12/14/2017   Procedure: POLYPECTOMY;  Surgeon: Daneil Dolin, MD;  [facility];  Service: Endoscopy;;    reversal of colostomy  10/2011   per patient, did not have report   right knee surgery     sigmoid colectomy with end colostomy, Hartmann's pouch  08/2010   [facility]. complicated diverticulitis   UMBILICAL HERNIA REPAIR      Current Outpatient Medications:    methylPREDNISolone (MEDROL DOSEPAK) 4 MG TBPK tablet, 6 day dose pack - take as directed, Disp: 21 tablet, Rfl: 0   albuterol (PROVENTIL HFA;VENTOLIN HFA) 108 (90 Base) MCG/ACT inhaler, Inhale 2 puffs into the lungs every 6 (six) hours as needed for wheezing or shortness of breath., Disp: , Rfl:    aspirin EC 81 MG EC tablet, Take 1 tablet (81 mg total) by mouth daily. Swallow whole., Disp: 30 tablet, Rfl: 11   Buprenorphine HCl-Naloxone HCl (SUBOXONE) 8-2 MG FILM, Place 1 Film under the tongue in the morning and at bedtime., Disp: 60 each, Rfl: 1   Cholecalciferol 25 MCG (1000 UT) TBDP, Take 25 mcg by mouth daily. (Patient taking differently: Take 1,000 Units by mouth daily.), Disp: 60 tablet, Rfl: 0   cloNIDine (CATAPRES) 0.1 MG tablet, Take 2 tablets (0.2 mg total) by mouth 2 (two) times daily., Disp: 360 tablet, Rfl: 0   diclofenac Sodium (VOLTAREN) 1 % GEL, Apply 2 g topically 4 (four) times daily. (Patient taking differently: Apply 2 g topically 2 (two) times daily as needed (pain).), Disp: 2 g, Rfl: 1   ezetimibe (ZETIA) 10 MG tablet, Take 1 tablet (10 mg total) by mouth daily. (Patient taking differently: Take 10 mg by mouth 3 (three) times a week.), Disp: 30 tablet, Rfl: 11   gabapentin (NEURONTIN) 300 MG capsule, Take 300 mg by mouth at bedtime as needed (pain)., Disp: , Rfl:    hydrochlorothiazide (HYDRODIURIL) 25 MG tablet, Take 1 tablet (25 mg total) by mouth daily., Disp: 90 tablet, Rfl: 3   hydrocortisone (ANUSOL-HC) 2.5 % rectal cream, Place 1 application rectally 2 (two) times daily. (Patient taking differently: Place 1 application rectally 2 (two) times daily as needed for hemorrhoids.), Disp: 30 g, Rfl: 1   meclizine  (ANTIVERT) 25 MG tablet, Take 1 tablet (25 mg total) by mouth 3 (three) times daily as needed for dizziness., Disp: 30 tablet, Rfl: 0   pantoprazole (PROTONIX) 40 MG tablet, Take 1 tablet by mouth once daily (Patient taking differently: Take 40 mg by mouth 2 (two) times daily as needed (acid reflux).), Disp: 90 tablet, Rfl: 0   sertraline (ZOLOFT) 50 MG tablet, Take 50 mg by mouth daily., Disp: , Rfl:    sildenafil (VIAGRA) 100 MG tablet, Take 1 tablet (100 mg total) by mouth as needed for erectile dysfunction., Disp: 20 tablet, Rfl: 1   tamsulosin (FLOMAX) 0.4 MG CAPS capsule, Take 0.4 mg by mouth daily., Disp: , Rfl:   Allergies  Allergen Reactions   Codeine Itching and Nausea And Vomiting   Latex Other (See Comments)    Turns skin red.    Lisinopril     Caused Kidney failure   Morphine Hives   Adhesive [Tape] Rash  Review of Systems Objective:  There were no vitals filed for this visit.  General: Well developed, nourished, in no acute distress, alert and oriented x3   Dermatological: Skin is warm, dry and supple bilateral. Nails x 10 are well maintained; remaining integument appears unremarkable at this time. There are no open sores, no preulcerative lesions, no rash or signs of infection present.  Small verrucoid lesion to the medial aspect of the first metatarsophalangeal joint of the left foot with mild hyperkeratotic tissue.  Vascular: Dorsalis Pedis artery and Posterior Tibial artery pedal pulses are 2/4 bilateral with immedate capillary fill time. Pedal hair growth present. No varicosities and no lower extremity edema present bilateral.   Neruologic: Grossly intact via light touch bilateral. Vibratory intact via tuning fork bilateral. Protective threshold with Semmes Wienstein monofilament intact to all pedal sites bilateral. Patellar and Achilles deep tendon reflexes 2+ bilateral. No Babinski or clonus noted bilateral.   Musculoskeletal: No gross boney pedal deformities  bilateral. No pain, crepitus, or limitation noted with foot and ankle range of motion bilateral. Muscular strength 5/5 in all groups tested bilateral.  Pain to palpation medial calcaneal tubercles bilateral.  No pain on medial lateral compression of the calcaneus  Gait: Unassisted, Nonantalgic.    Radiographs:  Radiographs taken today demonstrate osseously mature individual soft tissue increase in density plantar fascial cannula insertion site with mild plantar distally oriented calcaneal heel spurs.  No other significant osseous abnormalities  Assessment & Plan:   Assessment: Planter fasciitis bilateral left greater than right.  Verruca plantaris plantar medial aspect first metatarsophalangeal joint left  Plan: Discussed etiology pathology conservative surgical therapies at this point started him on methylprednisolone.  Did not use NSAIDs.  I injected his bilateral heels today 20 mg Kenalog 5 mg Marcaine point of maximal tenderness.  Tolerated procedure well without complications.  Also I wanted to place him in bilateral plantar fascial braces however they were not available today.  We did discuss appropriate shoe gear stretching exercise ice therapy shoe gear modifications I will follow-up with him in 1 month may need to consider excising that wart.     Leilah Polimeni T. [place], [place]

## 2021-08-11 ENCOUNTER — Other Ambulatory Visit: Payer: Self-pay | Admitting: Student in an Organized Health Care Education/Training Program

## 2021-08-11 DIAGNOSIS — F1110 Opioid abuse, uncomplicated: Secondary | ICD-10-CM

## 2021-08-12 NOTE — Telephone Encounter (Signed)
Buprenorphine HCl-Naloxone HCl (SUBOXONE) 8-2 MG FILM, REFILL REQUEST @ [place], [place].

## 2021-08-13 ENCOUNTER — Telehealth: Payer: Self-pay | Admitting: Internal Medicine

## 2021-08-13 NOTE — Telephone Encounter (Signed)
Pt is calling back about getting refill on Buprenorphine HCl-Naloxone HCl (SUBOXONE) 8-2 MG FILM @ [place], [place].

## 2021-08-13 NOTE — Telephone Encounter (Signed)
Refill Request   Buprenorphine HCl-Naloxone HCl (SUBOXONE) 8-2 MG [place], [place] (Ph: [phone])

## 2021-08-13 NOTE — Telephone Encounter (Signed)
This request has been sent in separate encounter. Also, spoke with OUD Attending who will address today.

## 2021-08-23 IMAGING — CT CT ABD-PELV W/ CM
2 of 5 series · 16 of 46 positions shown, 18 images · IV contrast (omnipaque)
Comparison: October 06, 2019

CLINICAL DATA: Diverticulitis suspected. Generalized abdominal
pain. Renal insufficiency. History of pancreatitis and colon cancer.
History of prior appendectomy.

EXAM:
CT ABDOMEN AND PELVIS WITH CONTRAST
TECHNIQUE: Multidetector CT imaging of the abdomen and pelvis was performed
using the standard protocol following bolus administration of
intravenous contrast.
CONTRAST:  100mL OMNIPAQUE IOHEXOL 300 MG/ML  SOLN

[Series 3: axial st · axial · 0.86mm/px · z∈[+771,+1176]mm · 13 of 95 slices shown, 15 images]
[im 7/95  soft-tissue]
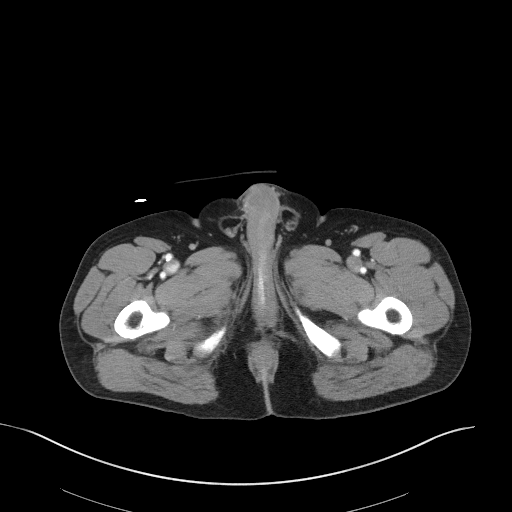
[im 7/95  bone]
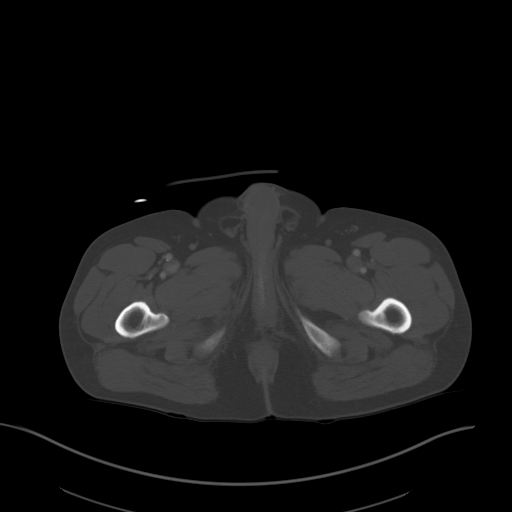
[im 14/95  soft-tissue]
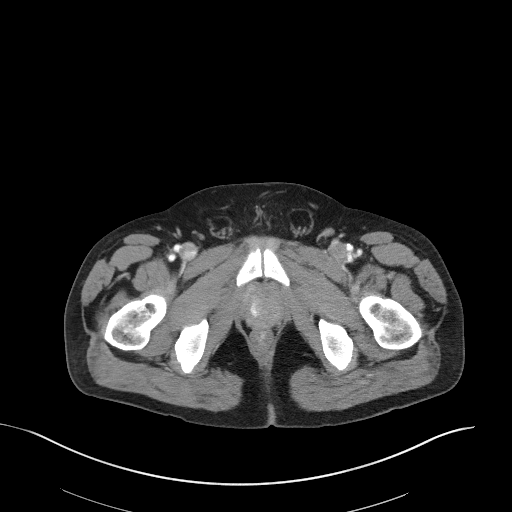
[im 21/95  soft-tissue]
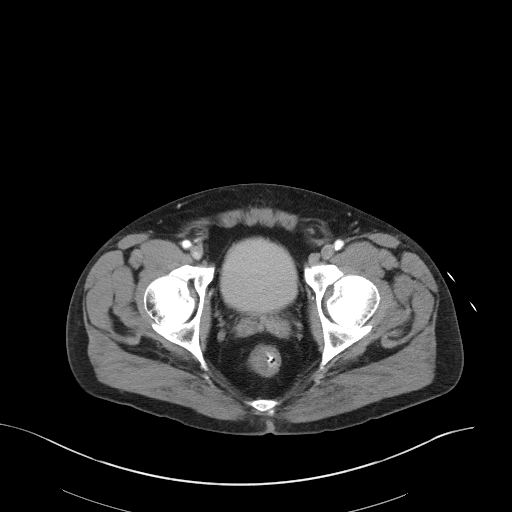
[im 27/95  soft-tissue]
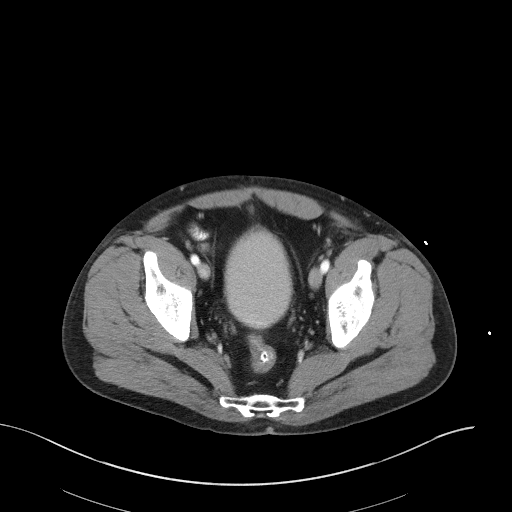
[im 34/95  soft-tissue]
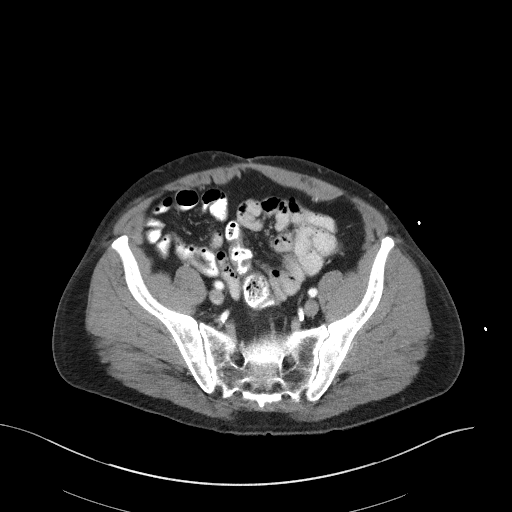
[im 41/95  soft-tissue]
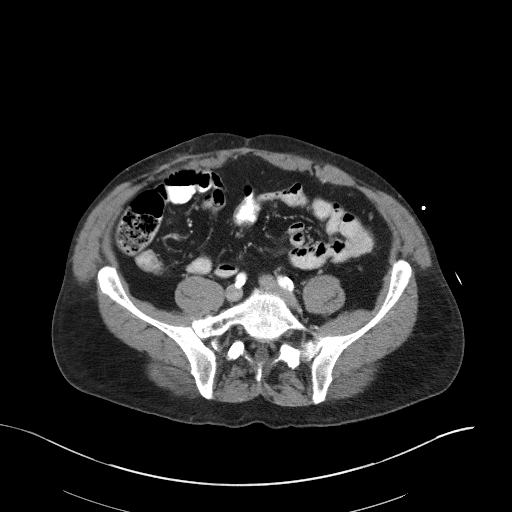
[im 48/95  soft-tissue]
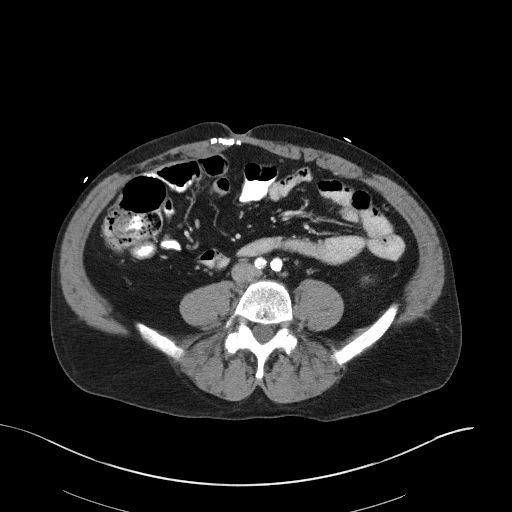
[im 54/95  soft-tissue]
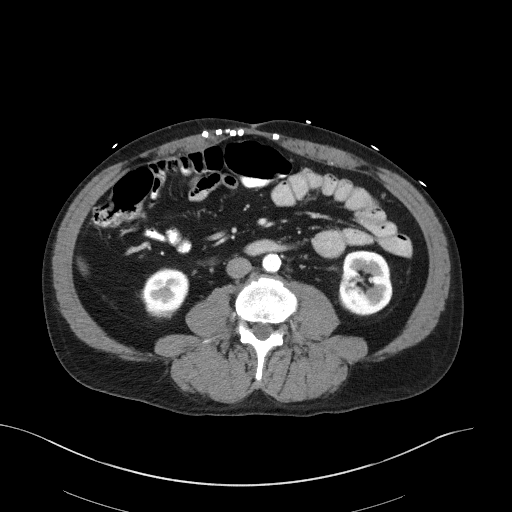
[im 61/95  soft-tissue]
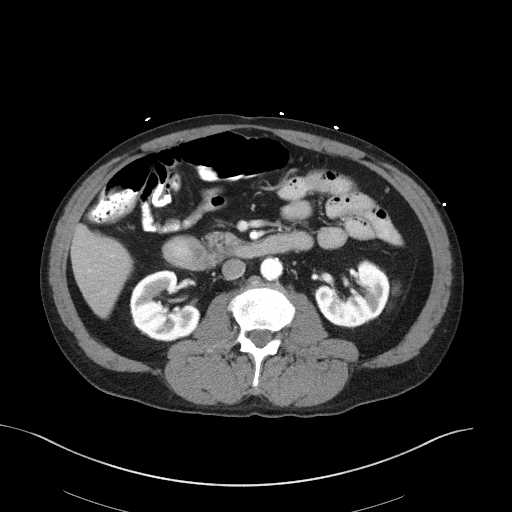
[im 61/95  bone]
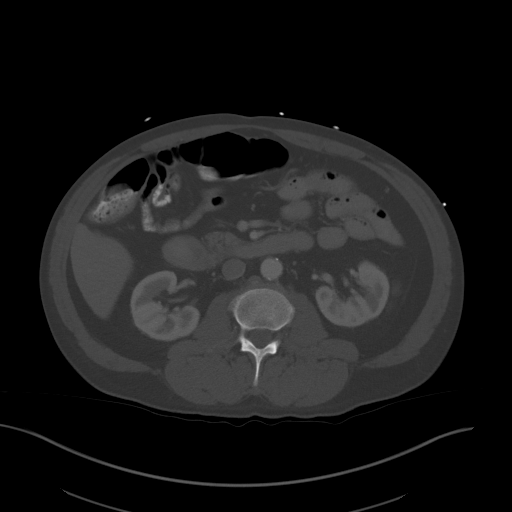
[im 68/95  soft-tissue]
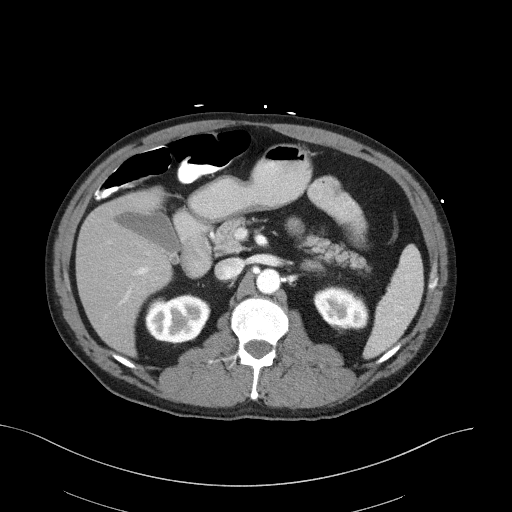
[im 74/95  soft-tissue]
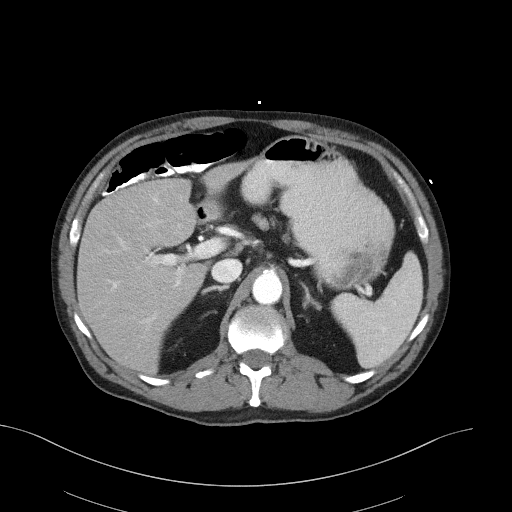
[im 81/95  soft-tissue]
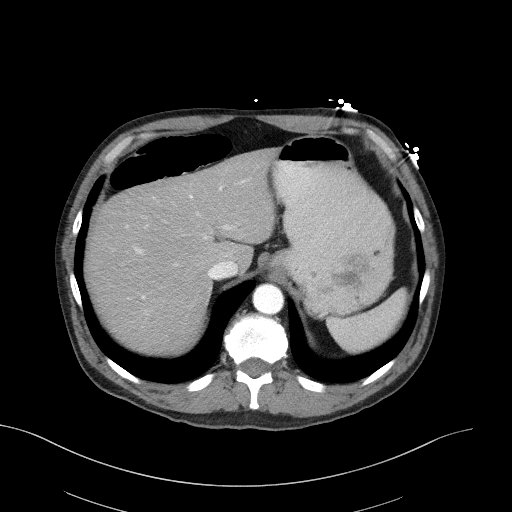
[im 88/95  soft-tissue]
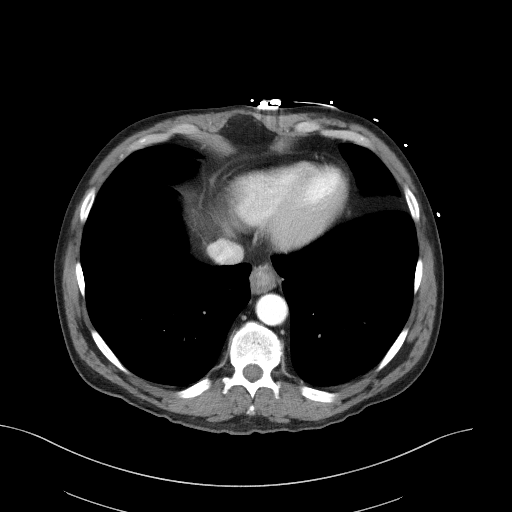

[Series 7: coronal st · coronal · 0.82mm/px · 3 of 102 slices shown]
[im 34/102  soft-tissue]
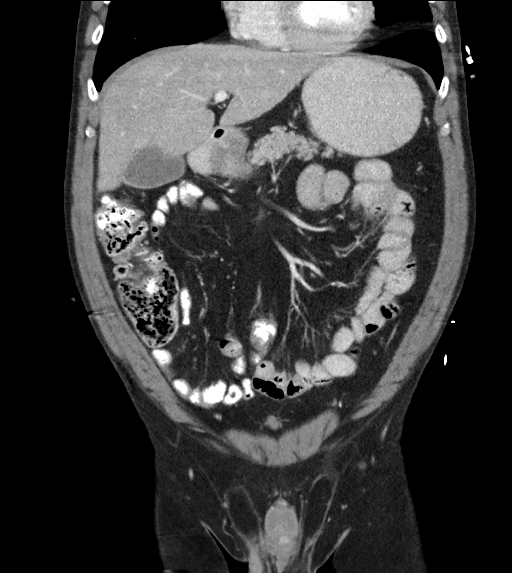
[im 45/102  soft-tissue]
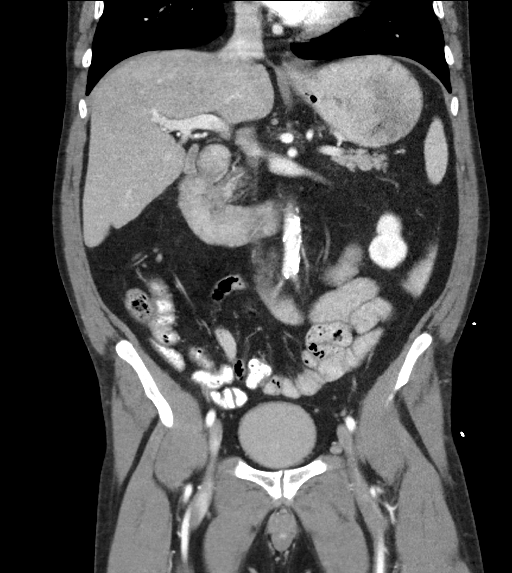
[im 57/102  soft-tissue]
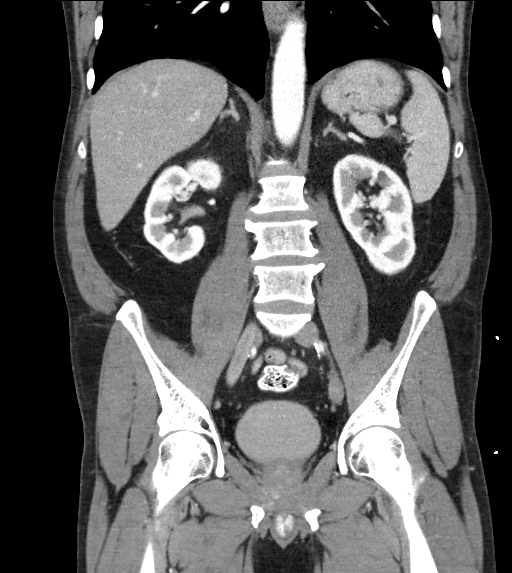

[16 of 46 positions shown; findings below may reference images not displayed]

FINDINGS: Lower chest: The lung bases are clear. The heart size is normal.

Hepatobiliary: The liver is normal. Normal gallbladder.There is no
biliary ductal dilation.

Pancreas: Normal contours without ductal dilatation. No
peripancreatic fluid collection.

Spleen: No splenic laceration or hematoma.

Adrenals/Urinary Tract:

--Adrenal glands: No adrenal hemorrhage.

--Right kidney/ureter: No hydronephrosis or perinephric hematoma.

--Left kidney/ureter: No hydronephrosis or perinephric hematoma.

--Urinary bladder: Unremarkable.

Stomach/Bowel:

--Stomach/Duodenum: There is mild diffuse wall thickening of the
distal esophagus. The stomach is unremarkable.

--Small bowel: No dilatation or inflammation.

--Colon: Patient is status post prior colectomy. There is no
evidence for diverticulitis.

--Appendix: Surgically absent.

Vascular/Lymphatic: Atherosclerotic calcification is present within
the non-aneurysmal abdominal aorta, without hemodynamically
significant stenosis.

--No retroperitoneal lymphadenopathy.

--No mesenteric lymphadenopathy.

--No pelvic or inguinal lymphadenopathy.

Reproductive: Unremarkable

Other: No ascites or free air. There are bilateral fat containing
inguinal hernias.

Musculoskeletal. No acute displaced fractures.
IMPRESSION: 1. No CT evidence of acute intra-abdominal or pelvic abnormalities.
2. Patient is status post prior partial colectomy. No evidence of
diverticulitis.
3. Mild diffuse wall thickening of the distal esophagus. Recommend
correlation for symptoms of esophagitis.
4. Bilateral fat containing inguinal hernias.

Aortic Atherosclerosis ([id number]-QK1.1).

## 2021-08-23 IMAGING — CT CT ANGIO CHEST
4 of 7 series · 18 of 46 positions shown · IV contrast (omnipaque)
Comparison: CT 04/04/2007

CLINICAL DATA: Primary essential hypertension, increasing chest
pain, rule out thoracic aortic aneurysm

EXAM:
CT ANGIOGRAPHY CHEST WITH CONTRAST
TECHNIQUE: Multidetector CT imaging of the chest was performed using the
standard protocol during bolus administration of intravenous
contrast. Multiplanar CT image reconstructions and MIPs were
obtained to evaluate the vascular anatomy.
CONTRAST:  80mL OMNIPAQUE IOHEXOL 350 MG/ML SOLN

[Series 6: pre · axial · non-contrast · 0.69mm/px · z∈[+1119,+1339]mm · 5 of 68 slices shown]
[im 12/68  lung]
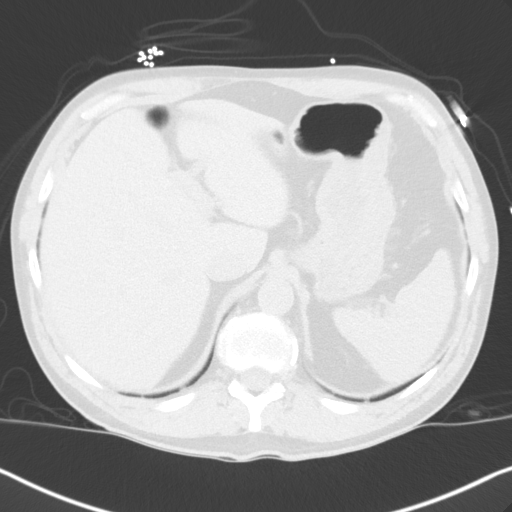
[im 23/68  soft-tissue]
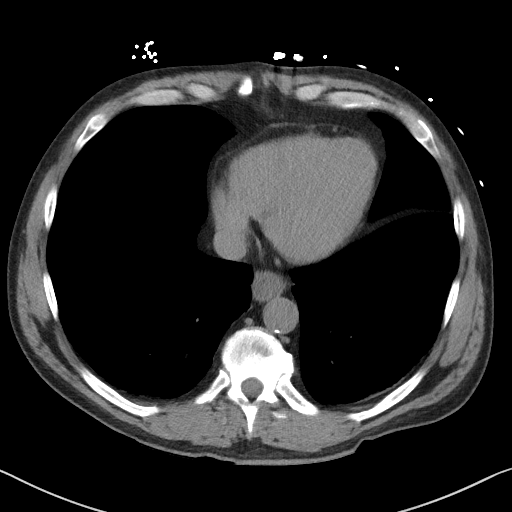
[im 34/68  lung]
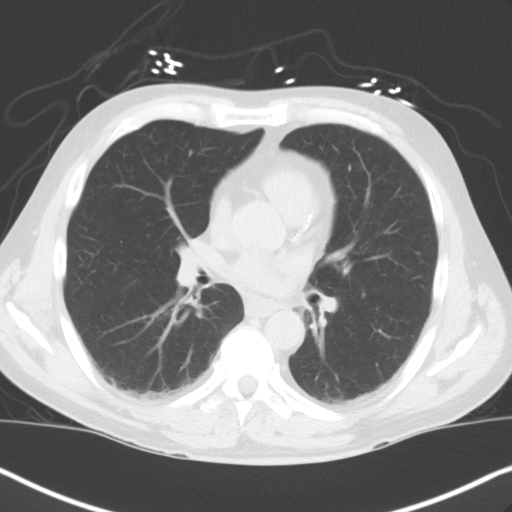
[im 45/68  soft-tissue]
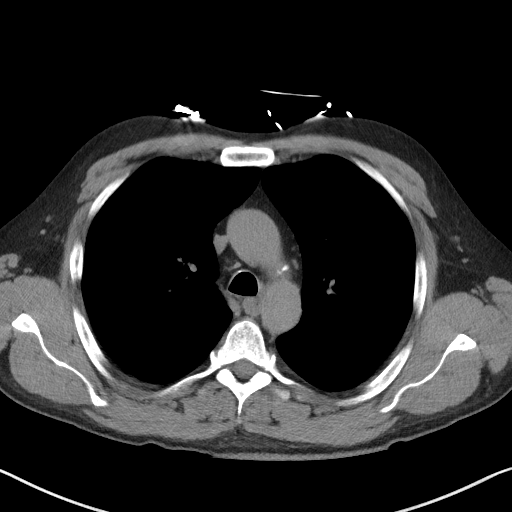
[im 56/68  lung]
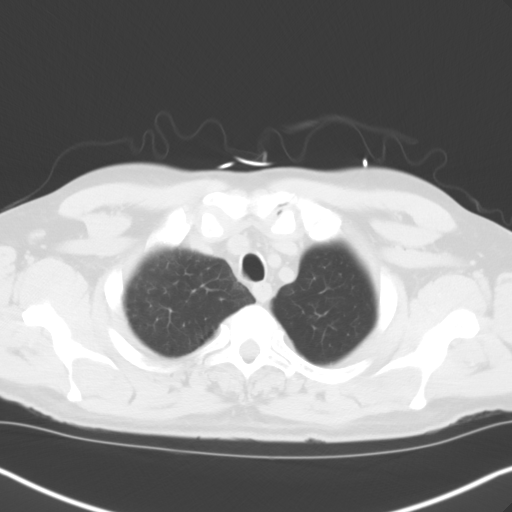

[Series 7: arterial · axial · arterial · 0.69mm/px · z∈[+1138,+1171]mm · 2 of 98 slices shown]
[im 11/98  lung]
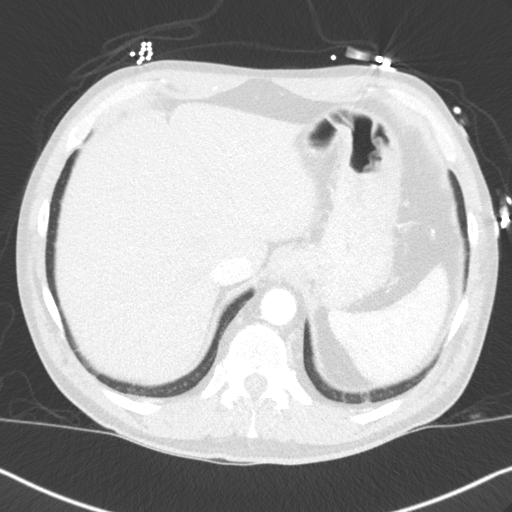
[im 22/98  lung]
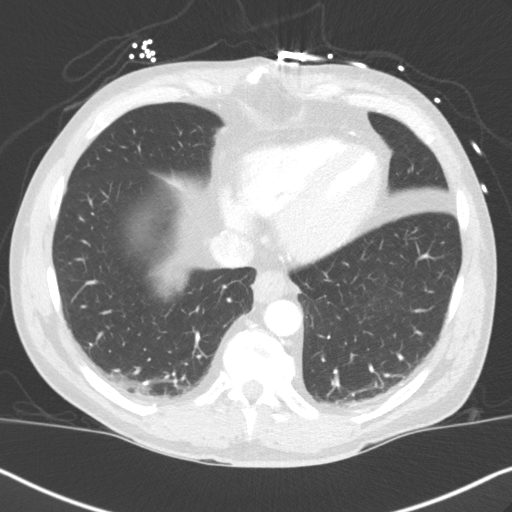

[Series 10: lungs · axial · 0.69mm/px · z∈[+1127,+1377]mm · 8 of 147 slices shown]
[im 11/147  lung]
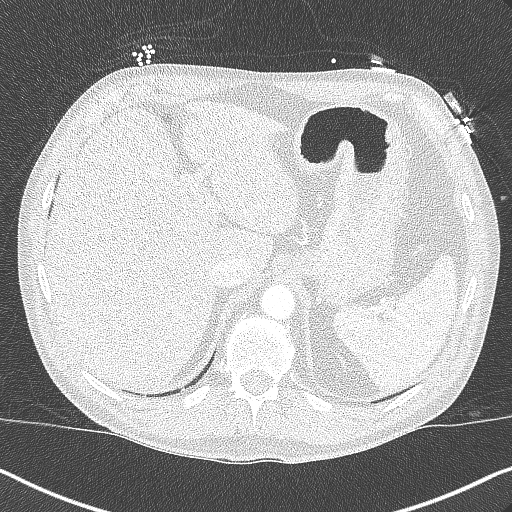
[im 32/147  lung]
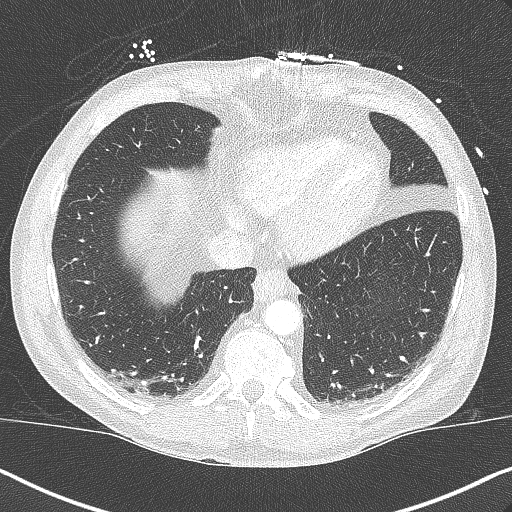
[im 53/147  lung]
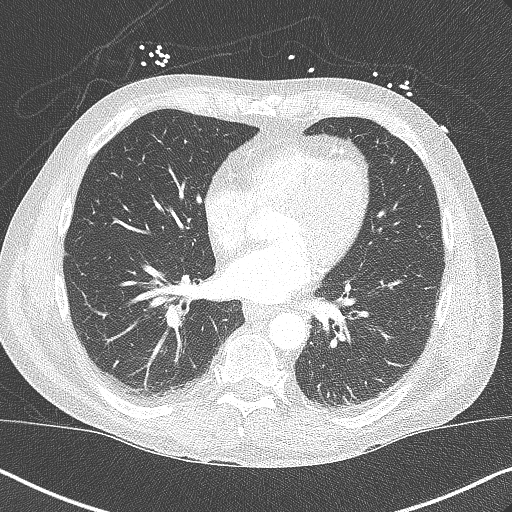
[im 63/147  lung]
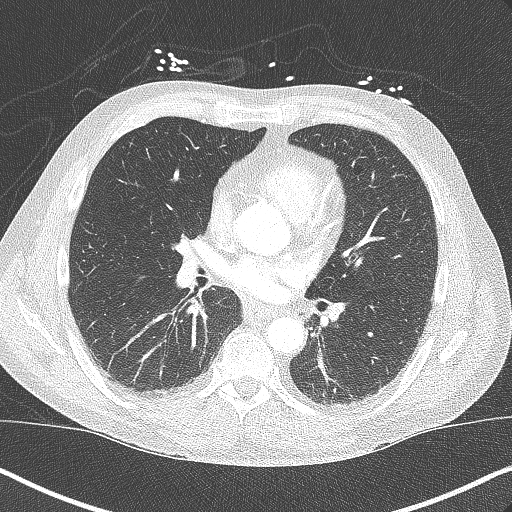
[im 84/147  lung]
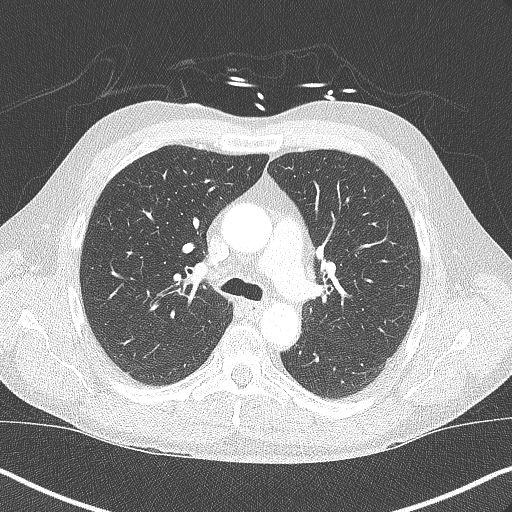
[im 94/147  lung]
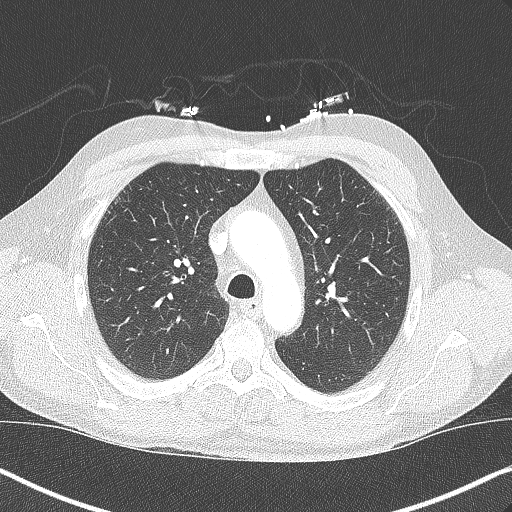
[im 115/147  lung]
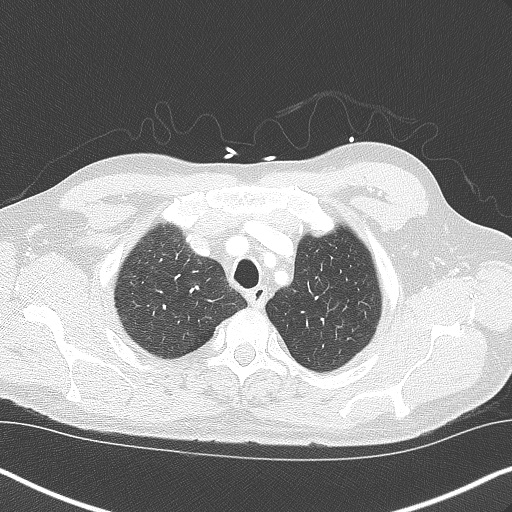
[im 136/147  lung]
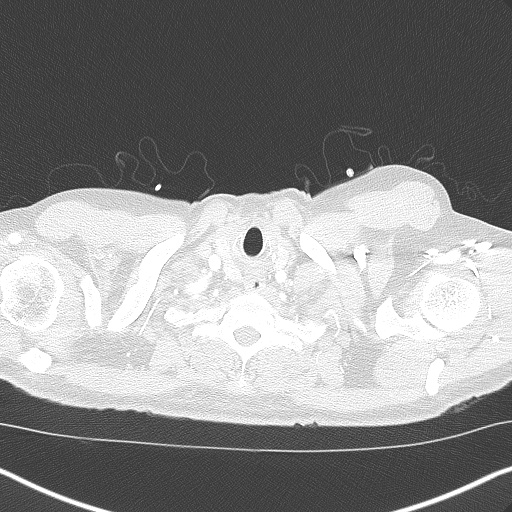

[Series 11: cor soft · coronal · 0.65mm/px · 3 of 167 slices shown]
[im 42/167  soft-tissue]
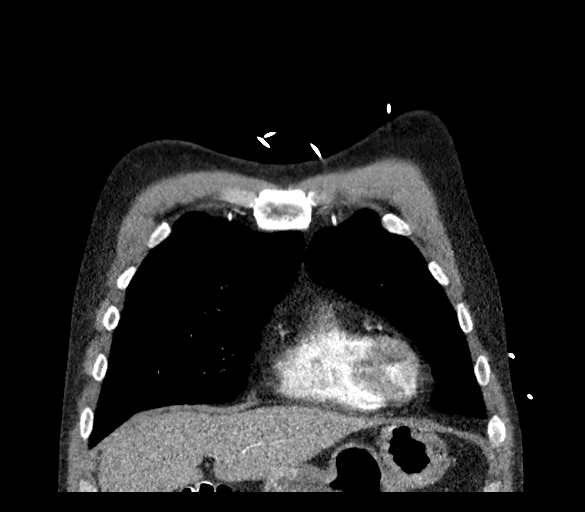
[im 84/167  soft-tissue]
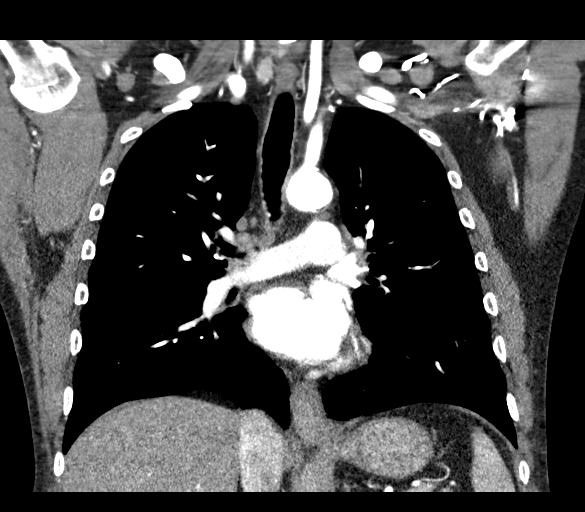
[im 125/167  soft-tissue]
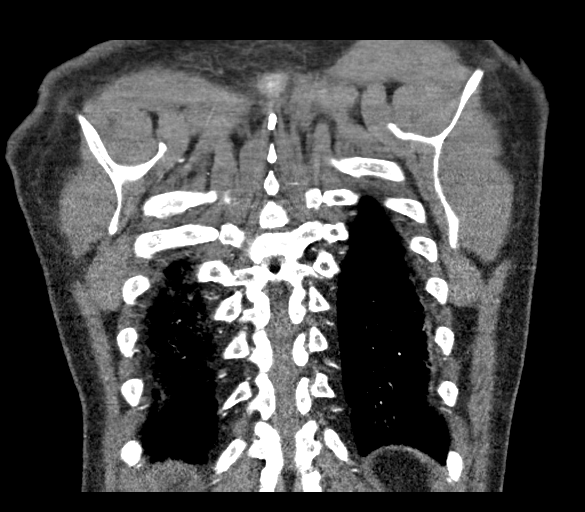

[18 of 46 positions shown; findings below may reference images not displayed]

FINDINGS: Cardiovascular: Preferential opacification of the thoracic aorta. No
evidence of thoracic aortic aneurysm or dissection. No periaortic
stranding or hemorrhage. Thoracic aortic atherosclerosis is present.
Minimal plaque in the normally branching great vessels as well.
Proximal great vessels normally opacified. Normal heart size. No
pericardial effusion. Coronary artery atherosclerosis is noted.
Central pulmonary arteries are suboptimally opacified on this exam
no large central filling defects are identified.

Mediastinum/Nodes: No enlarged mediastinal, hilar, or axillary lymph
nodes. Thyroid gland and thoracic inlet are unremarkable. No acute
abnormality of the trachea. There is diffuse edematous thickening of
the thoracic esophagus with some faint periaortic stranding at the
level of the diaphragmatic hiatus (7/83).

Lungs/Pleura: Mild paraseptal emphysematous changes towards the lung
apices. No consolidation, features of edema, pneumothorax, or
effusion. Bandlike atelectasis is noted posteriorly towards the lung
bases. No suspicious pulmonary nodules or masses.

Upper Abdomen: No acute abnormalities present in the visualized
portions of the upper abdomen.

Musculoskeletal: No acute osseous abnormality or suspicious osseous
lesion. Schmorl's nodes in the thoracic spine are similar to
comparison.

Review of the MIP images confirms the above findings.
IMPRESSION: 1. Thoracic aortic atherosclerosis with no evidence of aneurysm or
dissection.
2. Diffuse edematous thickening of the thoracic esophagus with some
faint periaortic stranding at the level of the diaphragmatic hiatus,
may represent esophagitis.
3. No other acute thoracic abnormality.
4. Coronary artery atherosclerosis.
5. Paraseptal Emphysema ([id number]-H10.Q).
6. Aortic Atherosclerosis ([id number]-9P2.2).

## 2021-08-26 ENCOUNTER — Ambulatory Visit (INDEPENDENT_AMBULATORY_CARE_PROVIDER_SITE_OTHER): Payer: 59 | Admitting: Internal Medicine

## 2021-08-26 ENCOUNTER — Encounter: Payer: Self-pay | Admitting: Internal Medicine

## 2021-08-26 ENCOUNTER — Other Ambulatory Visit: Payer: Self-pay

## 2021-08-26 VITALS — BP 125/95 | HR 93 | Temp 97.6°F | Ht 68.0 in | Wt 208.9 lb

## 2021-08-26 DIAGNOSIS — F32A Depression, unspecified: Secondary | ICD-10-CM

## 2021-08-26 DIAGNOSIS — F1110 Opioid abuse, uncomplicated: Secondary | ICD-10-CM

## 2021-08-26 DIAGNOSIS — R0781 Pleurodynia: Secondary | ICD-10-CM | POA: Diagnosis not present

## 2021-08-26 MED ORDER — BUPRENORPHINE HCL-NALOXONE HCL 8-2 MG SL FILM: ORAL_FILM | SUBLINGUAL | 0 refills | Status: DC

## 2021-08-26 NOTE — Assessment & Plan Note (Signed)
Patient endorses bilateral rib pain that started several weeks ago. He notices the pain is worse with movement. He states the pain is sporadic in nature and he has these episodes that last from 5-10 minutes. Describes a sharp pain in bilateral thoracic ribs which wraps around to his abdomen. He denies any falls, trauma, abdominal pain, nausea, vomiting, flank pain, fevers or chills, or rashes. He has not recognized any aggravating factors. Only rest has helped alleviate the pain. Endorses some intermittent dysuria, up to 2x a week, but this is not new.  On exam there was some tenderness to paraspinal musculature. No red flag symptoms such as difficulty with urination or bowel habits.   Assessment:  Unclear etiology of patient's bilateral thoracic rib pain. It appears to be musculoskeletal in nature as it is worse with movement. Recommended conservative management although difficult to treat as he does not know when the pain is going to occur. Differential also includes a radiculopathy type of pain, if symptoms persist or worsen can obtain imaging.   - Consider imaging if symptoms persist or worsen

## 2021-08-26 NOTE — Progress Notes (Signed)
° °  CC: rib cage pain, depression  HPI:  [name] is a 55 y.o. with a PMHx listed below presenting for evaluation of bilateral rib pain for the past few weeks and follow up on depression. For details of today's visit and the status of his chronic medical issues please refer to the assessment and plan.   Past Medical History:  Diagnosis Date   Acute pancreatitis    Anxiety    Arthritis    Colonic adenoma    COPD (chronic obstructive pulmonary disease) (HCC)    Depression    Diverticulitis of colon with perforation 08/2010   GERD (gastroesophageal reflux disease)    History of hiatal hernia    Hypertension    Polysubstance abuse ([place])    Renal insufficiency    Sleep apnea    Intolerant of CPAP   TIA (transient ischemic attack)    Review of Systems:   Review of Systems  Constitutional:  Negative for chills and fever.  Cardiovascular:  Negative for chest pain and palpitations.  Gastrointestinal:  Negative for abdominal pain, constipation, diarrhea, nausea and vomiting.  Genitourinary:  Negative for dysuria and urgency.  Musculoskeletal:  Positive for back pain and myalgias. Negative for falls.  Skin:  Negative for rash.  Psychiatric/Behavioral:  Positive for depression. The patient is nervous/anxious.     Physical Exam:  Vitals:   08/26/21 1539  BP: (!) 125/95  Pulse: 93  Temp: 97.6 F (36.4 C)  TempSrc: Oral  SpO2: 96%  Weight: 208 lb 14.4 oz (94.8 kg)  Height: 5\' 8"  (1.727 m)   Physical Exam General: alert, appears stated age, in no acute distress HEENT: Normocephalic, atraumatic, EOM intact, conjunctiva normal CV: Regular rate and rhythm, no murmurs rubs or gallops Pulm: Clear to auscultation bilaterally, normal work of breathing Abdomen: Soft, nondistended, bowel sounds present, no tenderness to palpation MSK: No lower extremity edema, tenderness to bilateral paraspinal muscles most pronounced around the thoracic region. No spinal point tenderness   Skin: Warm and dry Neuro: Alert and oriented x3   Assessment & Plan:   See Encounters Tab for problem based charting.  Patient discussed with Dr. Philipp Ovens

## 2021-08-26 NOTE — Patient Instructions (Signed)
It was a pleasure seeing you today!  Lets follow-up in 4 weeks for your Suboxone refill.  I do recommend still seeing a psychiatrist to find a better regimen for your anxiety and depression.  Do still encourage you to not take Xanax while you are on the Suboxone.  For your rib cage pain I do recommend supportive care such as icy hot and heating pads.  If this worsens I do want you to let me know and we can get imaging.   Happy holidays and happy new year!

## 2021-08-26 NOTE — Assessment & Plan Note (Signed)
Patient was started on zoloft 25 mg at his last visit. States he took this medication for 2-3 weeks but states he felt very paranoid on this medication and thus stopped taking this medication. He states he has tried many different anti-depressants unsuccessfully. He was previously referred to psych but states he was never seen by them. He states he has been doing better overall. Discussed the importance of finding a safer regimen for his anxiety. He states he has been on xanax for over 20 years and this is the only thing that works for him. He continues to get it from a family member.  Assessment:  Unfortunately patient continues to take a family member's xanax despite recurrent counseling about he risks. He states he gets about 50 tablets a month and takes it once in the morning and once at night. I do believe it will be very difficult for [name] to wean from xanax or suboxone at this point. Will continue to counsel patient on the risks and hope we can find a better regimen for his anxiety. Will send another referral to psychiatry to help find a safe regimen for his anxiety and depression.   -Referral to psychiatry

## 2021-08-26 NOTE — Assessment & Plan Note (Signed)
Discussed last UDS results with patient, which again showed use of Xanax and this time also showed metabolites of oxycodone. He states he informed the previous provider he took some pain medications for his heel pain which he received from his family member. He understands the risks of taking these medications with suboxone. He states he has been taking xanax for over 20 years and never had an issue. He denies any opioid abuse in the past and is not sure why he was started on suboxone. He states some days he only takes 1-1.5 films. However, also states if he does not take it he feels his chronic aches and pains. Discussed the only safe way to take xanax would be to be wean from suboxone. Discussed and reiterated the pain contract.  -Suboxone refill sent -Follow up tox assure

## 2021-09-03 NOTE — Progress Notes (Signed)
Internal Medicine Clinic Attending ° °Case discussed with Dr. Rehman  At the time of the visit.  We reviewed the resident’s history and exam and pertinent patient test results.  I agree with the assessment, diagnosis, and plan of care documented in the resident’s note.  ° °

## 2021-09-05 LAB — TOXASSURE SELECT,+ANTIDEPR,UR

## 2021-09-10 ENCOUNTER — Encounter: Payer: Self-pay | Admitting: Podiatry

## 2021-09-10 ENCOUNTER — Ambulatory Visit (INDEPENDENT_AMBULATORY_CARE_PROVIDER_SITE_OTHER): Payer: 59 | Admitting: Podiatry

## 2021-09-10 ENCOUNTER — Other Ambulatory Visit: Payer: Self-pay

## 2021-09-10 DIAGNOSIS — S93692A Other sprain of left foot, initial encounter: Secondary | ICD-10-CM | POA: Diagnosis not present

## 2021-09-10 DIAGNOSIS — M722 Plantar fascial fibromatosis: Secondary | ICD-10-CM

## 2021-09-10 MED ORDER — METHYLPREDNISOLONE 4 MG PO TBPK: ORAL_TABLET | ORAL | 0 refills | Status: DC

## 2021-09-11 NOTE — Progress Notes (Signed)
He presents today for follow-up of his bilateral heel pain states the right was doing just great the left 1 is worse than it ever was.  States that he was doing fine until just the other day.  States that he can barely put weight on it at this point.  Objective: Vital signs are stable he is alert and oriented x3.  There is no erythema cellulitis drainage or odor to the right foot that the left heel does demonstrate moderate edema and mild erythema to the area.  He has no pain on medial lateral compression of the calcaneus that he does have pain on palpation of the heel and medial longitudinal arch and up into the tarsal tunnel area.  He is unable to perform inversion maneuver.  Assessment: Cannot rule out gouty enthesopathy.  I cannot rule out plantar fascial rupture  Plan: MRI of the rear foot to evaluate for plantar fascial rupture and posterior tibial integrity.  Differential diagnosis and surgical considerations will be made from MRI.  Provided him with a Medrol Dosepak.

## 2021-09-16 NOTE — Progress Notes (Signed)
Referring Provider: Mike Craze, DO Primary Care Physician:  Mike Craze, DO  Chief Complaint  Patient presents with   Dysphagia    Having trouble swallowing again    HPI:   [name] is a 56 y.o. male presenting today with a history of dysphagia and hemorrhoids. Patient has significant history of colectomy with end colostomy (later reversed) anal fissure, acute pancreatitis, chronic abdominal pain, diverticulitis with perforation, GERD, Hiatal hernia, HTN, sleep apnea, and polysubstance abuse. Surveillance colonoscopy due in 2024.   Due to persistent dysphagia despite EGD in 2021, he had BPE July 2022 with moderate dysmotility and possible stricture. He had trace transient penetration as well. Sluggish primary peristaltic wave. Contrast statis throughout esophagus.   Underwent early interval EGD Sept 2022: inlet patch with superimposed nodule s/p biopsy. Normal esophagus s/p dilation. Normal gastric mucosa. Normal duodenum. Reflux changes on path.   States he had mild improvement with dilation but now back to issues. Has issues with food hanging and points to his neck and chest. Softer foods are easier but still will choke occasionally. Feels issues every now and then but improved overall. Has issues with liquids as well, too.   Protonix BID.   Past Medical History:  Diagnosis Date   Acute pancreatitis    Anxiety    Arthritis    Colonic adenoma    COPD (chronic obstructive pulmonary disease) (HCC)    Depression    Diverticulitis of colon with perforation 08/2010   GERD (gastroesophageal reflux disease)    History of hiatal hernia    Hypertension    Polysubstance abuse ([place])    Renal insufficiency    Sleep apnea    Intolerant of CPAP   TIA (transient ischemic attack)     Past Surgical History:  Procedure Laterality Date   APPENDECTOMY     BIOPSY  12/14/2017   Procedure: BIOPSY;  Surgeon: Daneil Dolin, MD;  [facility];  Service:  Endoscopy;;  gastric    BIOPSY  10/13/2019   Procedure: BIOPSY;  Surgeon: Daneil Dolin, MD;  [facility];  Service: Endoscopy;;  esophagus   BIOPSY  05/20/2021   Procedure: BIOPSY;  Surgeon: Daneil Dolin, MD;  [facility];  Service: Endoscopy;;   clamp left in colon after abd surgery     COLON SURGERY  05/2011   [facility]. exp laparotomy with colostomy takedown, lysis of adhesions (3hours), loop ileostomy. complicated by abscess formation requiring percutaneous drainage   colonoscopy via stoma with endoscopy of [name]  04/2011   [facility]. two sessile polyps in trv colon. complete resection but only partial retrieval. congested mucosa in area 3cm proximal to stoma, erythematous mucosa in Hartmann pouch. repeat tcs 8/2017path-adenomatous polyp   COLONOSCOPY WITH PROPOFOL N/A 12/14/2017   Dr. Gala Romney: 2 4 to 6 mm polyps in the cecum, tubular adenomas, single rectal polyp tubular adenoma, nonbleeding internal hemorrhoids.  Appears to be a surgical clip at the appendiceal orifice.  Next colonoscopy in 5 years   ESOPHAGOGASTRODUODENOSCOPY  03/05/2012   erosive reflux esophagitis, bulbar erosions, gastric erosions/petichae, bx showed gastritis, no H.Pylori. Procedure: ESOPHAGOGASTRODUODENOSCOPY (EGD);  Surgeon: Daneil Dolin, MD;  [facility];  Service: Endoscopy;  Laterality: N/A;  11:30   ESOPHAGOGASTRODUODENOSCOPY (EGD) WITH PROPOFOL N/A 12/14/2017   Dr. Gala Romney: Erosive reflux esophagitis with questionable mild stricturing/non-obstruction at this level status post dilation.  multiple gastric erosions in the stomach biopsy showed reactive  gastropathy but no H. pylori.   ESOPHAGOGASTRODUODENOSCOPY (EGD) WITH PROPOFOL N/A 10/13/2019   negative Barrett's, normal stomach   ESOPHAGOGASTRODUODENOSCOPY (EGD) WITH PROPOFOL N/A 05/20/2021   inlet patch with superimposed nodule s/p biopsy. Normal esophagus s/p dilation. Normal gastric mucosa. Normal duodenum. Reflux  changes on path.   HERNIA REPAIR     KNEE SURGERY Right 2006   MALONEY DILATION N/A 12/14/2017   Procedure: Venia Minks DILATION;  Surgeon: Daneil Dolin, MD;  [facility];  Service: Endoscopy;  Laterality: N/A;   MALONEY DILATION  05/20/2021   Procedure: Venia Minks DILATION;  Surgeon: Daneil Dolin, MD;  [facility];  Service: Endoscopy;;   MULTIPLE EXTRACTIONS WITH ALVEOLOPLASTY  02/23/2012   Procedure: MULTIPLE EXTRACION WITH ALVEOLOPLASTY;  Surgeon: Gae Bon, DDS;  Location: [facility];  Service: Oral Surgery;  Laterality: Bilateral;  Removal of left mandibular torus, multiple extractions with alveoloplasty   POLYPECTOMY  12/14/2017   Procedure: POLYPECTOMY;  Surgeon: Daneil Dolin, MD;  [facility];  Service: Endoscopy;;   reversal of colostomy  10/2011   per patient, did not have report   right knee surgery     sigmoid colectomy with end colostomy, Hartmann's pouch  08/2010   [facility]. complicated diverticulitis   UMBILICAL HERNIA REPAIR      Current Outpatient Medications  Medication Sig Dispense Refill   albuterol (PROVENTIL HFA;VENTOLIN HFA) 108 (90 Base) MCG/ACT inhaler Inhale 2 puffs into the lungs every 6 (six) hours as needed for wheezing or shortness of breath.     aspirin EC 81 MG EC tablet Take 1 tablet (81 mg total) by mouth daily. Swallow whole. 30 tablet 11   Buprenorphine HCl-Naloxone HCl 8-2 MG FILM PLACE 1 STRIP UNDER THE TONGUE IN THE MORNING AND AT BEDTIME 60 each 0   cloNIDine (CATAPRES) 0.1 MG tablet Take 2 tablets (0.2 mg total) by mouth 2 (two) times daily. (Patient taking differently: Take 0.2 mg by mouth daily.) 360 tablet 0   diclofenac Sodium (VOLTAREN) 1 % GEL Apply 2 g topically 4 (four) times daily. (Patient taking differently: Apply 2 g topically 2 (two) times daily as needed (pain).) 2 g 1   gabapentin (NEURONTIN) 300 MG capsule Take 300 mg by mouth at bedtime as needed (pain).     hydrochlorothiazide (HYDRODIURIL) 25  MG tablet Take 1 tablet (25 mg total) by mouth daily. 90 tablet 3   meclizine (ANTIVERT) 25 MG tablet Take 1 tablet (25 mg total) by mouth 3 (three) times daily as needed for dizziness. 30 tablet 0   pantoprazole (PROTONIX) 40 MG tablet Take 1 tablet by mouth once daily (Patient taking differently: Take 40 mg by mouth 2 (two) times daily.) 90 tablet 0   sertraline (ZOLOFT) 25 MG tablet Take 25 mg by mouth daily.     sildenafil (VIAGRA) 100 MG tablet Take 1 tablet (100 mg total) by mouth as needed for erectile dysfunction. 20 tablet 1   tamsulosin (FLOMAX) 0.4 MG CAPS capsule Take 0.4 mg by mouth daily.     hydrocortisone (ANUSOL-HC) 2.5 % rectal cream Place 1 application rectally 2 (two) times daily as needed for hemorrhoids. 28 g 1   No current facility-administered medications for this visit.    Allergies as of 09/17/2021 - Review Complete 09/17/2021  Allergen Reaction Noted   Codeine Itching and Nausea And Vomiting    Latex Other (See Comments) 03/09/2012   Lisinopril  05/27/2017   Morphine Hives    Adhesive [  tape] Rash 03/08/2012    Family History  Problem Relation Age of Onset   Stomach cancer Father    Lung cancer Father    Throat cancer Father    Cancer Father        lung and throat cancer   Hypertension Father    Liver disease Cousin        cirrhosis, etoh   Throat cancer Paternal Uncle    Lung cancer Paternal Uncle    Colon cancer Cousin 70   Heart disease Mother    Hypertension Mother    Colon cancer Maternal Uncle    Throat cancer Paternal Uncle    Lung cancer Paternal Uncle    Throat cancer Paternal Uncle    Lung cancer Paternal Uncle     Social History   Socioeconomic History   Marital status: Single    Spouse name: Not on file   Number of children: 0   Years of education: Not on file   Highest education level: Not on file  Occupational History   Not on file  Tobacco Use   Smoking status: Every Day    Packs/day: 1.00    Years: 37.00    Pack years:  37.00    Types: Cigarettes   Smokeless tobacco: Current    Types: Chew   Tobacco comments:    Trying to quit, has nicotine patches at home which have helped him quit before  Vaping Use   Vaping Use: Never used  Substance and Sexual Activity   Alcohol use: Not Currently   Drug use: Yes    Frequency: 2.0 times per week    Types: Marijuana, Benzodiazepines    Comment: 09/17/21 occ marijuana   Sexual activity: Not on file  Other Topics Concern   Not on file  Social History Narrative   Not on file   Social Determinants of Health   Financial Resource Strain: Not on file  Food Insecurity: Not on file  Transportation Needs: Not on file  Physical Activity: Not on file  Stress: Not on file  Social Connections: Not on file    Review of Systems: Gen: Denies fever, chills, anorexia. Denies fatigue, weakness, weight loss.  CV: Denies chest pain, palpitations, syncope, peripheral edema, and claudication. Resp: Denies dyspnea at rest, cough, wheezing, coughing up blood, and pleurisy. GI: see HPI Derm: Denies rash, itching, dry skin Psych: Denies depression, anxiety, memory loss, confusion. No homicidal or suicidal ideation.  Heme: Denies bruising, bleeding, and enlarged lymph nodes.  Physical Exam: BP (!) 157/101    Pulse 73    Temp (!) 97.3 F (36.3 C) (Temporal)    Ht 5\' 8"  (1.727 m)    Wt 206 lb 6.4 oz (93.6 kg)    BMI 31.38 kg/m  General:   Alert and oriented. No distress noted. Pleasant and cooperative.  Head:  Normocephalic and atraumatic. Eyes:  Conjuctiva clear without scleral icterus. Mouth:  mask in place Abdomen:  +BS, soft, non-tender and non-distended. No rebound or guarding. No HSM or masses noted. Msk:  Symmetrical without gross deformities. Normal posture. Extremities:  Without edema. Neurologic:  Alert and  oriented x4 Psych:  Alert and cooperative. Normal mood and affect.  ASSESSMENT: [name] is a 56 y.o. male presenting today with a history of dysphagia  and hemorrhoids. Patient has significant history of colectomy with end colostomy (later reversed) anal fissure, acute pancreatitis, chronic abdominal pain, diverticulitis with perforation, GERD, Hiatal hernia, HTN, sleep apnea, and polysubstance abuse. Surveillance  colonoscopy due in 2024.   Dysphagia: brief improvement s/p EGD Sept 2022 with dilation but now recurrent. BPE on file with dysmotility. We will pursue manometry in near future. As of note, BPE also noted possible stricture. Normal esophagus noted on EGD.   Continue PPI BID. Swallowing precautions discussed with patient.    PLAN:  Referral for manometry Continue PPI BID Return 6 months  Annitta Needs, PhD, [facility]astroenter[facility]

## 2021-09-17 ENCOUNTER — Encounter: Payer: Self-pay | Admitting: Gastroenterology

## 2021-09-17 ENCOUNTER — Other Ambulatory Visit: Payer: Self-pay | Admitting: *Deleted

## 2021-09-17 ENCOUNTER — Ambulatory Visit (INDEPENDENT_AMBULATORY_CARE_PROVIDER_SITE_OTHER): Payer: 59 | Admitting: Gastroenterology

## 2021-09-17 ENCOUNTER — Other Ambulatory Visit: Payer: Self-pay

## 2021-09-17 VITALS — BP 157/101 | HR 73 | Temp 97.3°F | Ht 68.0 in | Wt 206.4 lb

## 2021-09-17 DIAGNOSIS — K219 Gastro-esophageal reflux disease without esophagitis: Secondary | ICD-10-CM

## 2021-09-17 DIAGNOSIS — R1319 Other dysphagia: Secondary | ICD-10-CM

## 2021-09-17 MED ORDER — HYDROCORTISONE (PERIANAL) 2.5 % EX CREA: 1.0000 "application " | TOPICAL_CREAM | Freq: Two times a day (BID) | CUTANEOUS | 1 refills | Status: DC | PRN

## 2021-09-17 NOTE — Patient Instructions (Signed)
We are arranging an esophageal manometry.   Continue to take Protonix twice a day, 30 minutes before breakfast and dinner.   We will see you back in 6 months! We will review the manometry when it is completed!  I enjoyed seeing you again today! As you know, I value our relationship and want to provide genuine, compassionate, and quality care. I welcome your feedback. If you receive a survey regarding your visit,  I greatly appreciate you taking time to fill this out. See you next time!  Annitta Needs, PhD, ANP-BC [facility]

## 2021-09-19 ENCOUNTER — Telehealth: Payer: Self-pay | Admitting: Podiatry

## 2021-09-19 DIAGNOSIS — S93692A Other sprain of left foot, initial encounter: Secondary | ICD-10-CM

## 2021-09-19 NOTE — Telephone Encounter (Signed)
Sent in referral to [facility] for [facility] - facility will contact him.

## 2021-09-19 NOTE — Addendum Note (Signed)
Addended by: Rip Harbour on: 09/19/2021 04:45 PM   Modules accepted: Orders

## 2021-09-19 NOTE — Telephone Encounter (Signed)
Patient called and stated that a referral was sent to [facility]. [facility] told the patient that they don't accept [employer] and that his referral would need to be sent to another office. Patient would like a radiology department in [place] if possible.

## 2021-09-25 ENCOUNTER — Telehealth: Payer: Self-pay | Admitting: *Deleted

## 2021-09-25 NOTE — Telephone Encounter (Signed)
Submitted prior auth paperwork today around 1pm. Advised it would take 3 days to process, so had facility reschedule to a later date.

## 2021-09-25 NOTE — Telephone Encounter (Signed)
Thanks,got prior authorization

## 2021-09-25 NOTE — Telephone Encounter (Signed)
[name] w/ [facility]([phone],ext. [phone]) is calling because patient is scheduled for upcoming MRI left ankle w/o contrast ,no prior authorization on file. Please contact insurance for this. The patient has [employer] and a form has to be submitted, will take some time. I will fax if you have not yet already but his appointment (09/26/21)may have to be rescheduled. Received the fax from [employer] this afternoon for prior authorization:[id number],valid from :09/25/21-12/24/21. Called and gave information to [name] w/ [name], patient was rescheduled to 10/04/2021.

## 2021-09-26 ENCOUNTER — Ambulatory Visit (HOSPITAL_COMMUNITY): Payer: 59

## 2021-09-30 ENCOUNTER — Other Ambulatory Visit: Payer: Self-pay

## 2021-09-30 ENCOUNTER — Telehealth: Payer: Self-pay | Admitting: Gastroenterology

## 2021-09-30 DIAGNOSIS — R1319 Other dysphagia: Secondary | ICD-10-CM

## 2021-09-30 NOTE — Telephone Encounter (Signed)
Inbound call from patient. His GI in [place] is referring him to Dr. Silverio Decamp for a swallow test

## 2021-10-01 ENCOUNTER — Ambulatory Visit (INDEPENDENT_AMBULATORY_CARE_PROVIDER_SITE_OTHER): Payer: 59 | Admitting: Podiatry

## 2021-10-01 ENCOUNTER — Telehealth: Payer: Self-pay | Admitting: Internal Medicine

## 2021-10-01 ENCOUNTER — Other Ambulatory Visit: Payer: Self-pay

## 2021-10-01 DIAGNOSIS — S93692D Other sprain of left foot, subsequent encounter: Secondary | ICD-10-CM

## 2021-10-01 DIAGNOSIS — M722 Plantar fascial fibromatosis: Secondary | ICD-10-CM

## 2021-10-01 MED ORDER — GABAPENTIN 300 MG PO CAPS: 300.0000 mg | ORAL_CAPSULE | Freq: Three times a day (TID) | ORAL | 3 refills | Status: DC

## 2021-10-01 MED ORDER — TRIAMCINOLONE ACETONIDE 40 MG/ML IJ SUSP
20.0000 mg | Freq: Once | INTRAMUSCULAR | Status: AC
  Administered 2021-10-01: 16:00:00 20 mg

## 2021-10-01 NOTE — Telephone Encounter (Signed)
Patient called with some questions, said he was supposed to call some place for an appointment and they have never called him back.  Please advise

## 2021-10-01 NOTE — Telephone Encounter (Signed)
Called pt. He stated he has already been called with an appt. Needed nothing further

## 2021-10-01 NOTE — Telephone Encounter (Signed)
Spoke with the patient. Advised of the Esophageal Manometry test on 12/11/21 at 12:30. He will arrive to the Admitting office at 12:00 pm. Written instructions mailed to the patient.

## 2021-10-02 NOTE — Progress Notes (Signed)
He presents today chief complaint of pain to his left heel.  He states that is really been hurting him for the past 2 weeks he denies any trauma to it states that is just the fasciitis is getting so bad I can hardly stand to touch it much less walk on it and waiting for the MRI.  He states that he would like to have some type of narcotic to help alleviate his symptoms.  Objective: Vital signs are stable he is alert and oriented x3.  Pulses are palpable.  Seems to be in distress with gait and ambulation he also seems to be in distress just sitting with this painful left heel.  Assessment Planter fasciitis.  Plan: At this point we will put him in a cam walker and we will increase his gabapentin.  He will follow-up with me once his MRI has been taken we also injected his left heel today with 10 mg of Kenalog 5 mg Marcaine.

## 2021-10-04 ENCOUNTER — Ambulatory Visit (HOSPITAL_COMMUNITY)
Admission: RE | Admit: 2021-10-04 | Discharge: 2021-10-04 | Disposition: A | Discharge status: Home / Self Care | Payer: 59 | Source: Ambulatory Visit | Attending: Podiatry | Admitting: Podiatry

## 2021-10-04 ENCOUNTER — Other Ambulatory Visit: Payer: Self-pay

## 2021-10-04 DIAGNOSIS — S93692A Other sprain of left foot, initial encounter: Secondary | ICD-10-CM | POA: Insufficient documentation

## 2021-10-09 ENCOUNTER — Encounter: Payer: Self-pay | Admitting: Internal Medicine

## 2021-10-09 ENCOUNTER — Ambulatory Visit (INDEPENDENT_AMBULATORY_CARE_PROVIDER_SITE_OTHER): Payer: 59 | Admitting: Internal Medicine

## 2021-10-09 DIAGNOSIS — F1110 Opioid abuse, uncomplicated: Secondary | ICD-10-CM | POA: Diagnosis not present

## 2021-10-09 DIAGNOSIS — M549 Dorsalgia, unspecified: Secondary | ICD-10-CM | POA: Diagnosis not present

## 2021-10-09 DIAGNOSIS — I10 Essential (primary) hypertension: Secondary | ICD-10-CM

## 2021-10-09 MED ORDER — BUPRENORPHINE HCL-NALOXONE HCL 8-2 MG SL FILM: ORAL_FILM | SUBLINGUAL | 1 refills | Status: DC

## 2021-10-09 MED ORDER — SPIRONOLACTONE 25 MG PO TABS: 25.0000 mg | ORAL_TABLET | Freq: Every day | ORAL | 2 refills | Status: DC

## 2021-10-09 NOTE — Progress Notes (Signed)
°  Subjective:  HPI: [name] is a 56 y.o. male who presents for f/u back pain/ suboxone.  He apparently was scheduled for a visit yesterday but is certain he was told it was on the 1st, dove over an hour to get here (lives near [place]).  Please see Assessment and Plan below for the status of his chronic medical problems.  Objective:  Physical Exam: Vitals:   10/09/21 0925 10/09/21 0927  BP: (!) 175/101 (!) 153/95  Pulse: 81 73  Resp: (!) 24   Temp: 97.7 F (36.5 C)   TempSrc: Oral   SpO2: 97%   Weight: 209 lb 14.4 oz (95.2 kg)   Height: 5\' 8"  (1.727 m)    Body mass index is 31.92 kg/m. Physical Exam Vitals and nursing note reviewed.  Constitutional:      Appearance: Normal appearance.  Pulmonary:     Effort: Pulmonary effort is normal.  Neurological:     Mental Status: He is alert.  Psychiatric:        Mood and Affect: Mood normal.        Behavior: Behavior normal.   Assessment & Plan:  See Encounters Tab for problem based charting.  Medications Ordered Meds ordered this encounter  Medications   Buprenorphine HCl-Naloxone HCl 8-2 MG FILM    Sig: PLACE 1 STRIP UNDER THE TONGUE IN THE MORNING AND AT BEDTIME    Dispense:  60 each    Refill:  1    [id number] please fill 30 days after last rx   spironolactone (ALDACTONE) 25 MG tablet    Sig: Take 1 tablet (25 mg total) by mouth daily.    Dispense:  30 tablet    Refill:  2   Other Orders No orders of the defined types were placed in this encounter.  Follow Up: Return 1 month for BP recheck and blood work.

## 2021-10-09 NOTE — Assessment & Plan Note (Signed)
BP elevated today, sounds like he does not always take the clonidine tiwce daily but does report taking it last night.  Clonidine may not be the best medication for him.  He has a listed allergy to ACEi (renal failure) in discussing this with him it sounds like it was more complicated that ACEi induced renal failure and I suspect it may have been just a contributory factor. Unfortunately  I cannot find notes at this time about it.    For now will try addition of spironolactone 25mg  daily. Have him back in 1 month for BMP and BP recheck.

## 2021-10-09 NOTE — Assessment & Plan Note (Signed)
Patient has mild OUD but with chronic back and neck pain previously treated with opioids (with history of taking non prescribed opioids as well to self medicate).  He is now doing well on suboxone for his back pain. - Refill suboxone 2 month supply.

## 2021-10-21 ENCOUNTER — Telehealth: Payer: Self-pay

## 2021-10-21 NOTE — Telephone Encounter (Signed)
Documentation from [employer] regarding the pt being approved for Pantoprazole Sodium 40 mg tab delayed release from 10/21/2021 to 10/21/2022. Will fax the pharmacy and advise the pt as well.  Gave to Manuela Schwartz to scan in chart

## 2021-10-21 NOTE — Telephone Encounter (Signed)
PA done on Pantoprazole 40 mg . Dx used: K21.9- GERD) and R13.19 Dyshagia. Pt has tried and failed: Omperazole, Carafate and Dexilant. Waiting on a response from [employer].

## 2021-10-22 ENCOUNTER — Encounter: Payer: Self-pay | Admitting: Podiatry

## 2021-10-22 ENCOUNTER — Other Ambulatory Visit: Payer: Self-pay

## 2021-10-22 ENCOUNTER — Ambulatory Visit (INDEPENDENT_AMBULATORY_CARE_PROVIDER_SITE_OTHER): Payer: 59 | Admitting: Podiatry

## 2021-10-22 DIAGNOSIS — D2372 Other benign neoplasm of skin of left lower limb, including hip: Secondary | ICD-10-CM

## 2021-10-22 DIAGNOSIS — S93692D Other sprain of left foot, subsequent encounter: Secondary | ICD-10-CM | POA: Diagnosis not present

## 2021-10-22 DIAGNOSIS — S86312D Strain of muscle(s) and tendon(s) of peroneal muscle group at lower leg level, left leg, subsequent encounter: Secondary | ICD-10-CM | POA: Diagnosis not present

## 2021-10-23 NOTE — Progress Notes (Signed)
He presents today for follow-up of his MRI regarding his left foot and ankle.  States that using the boot at nighttime and have also over-the-counter inserts that seem to help to some degree.  He continues to utilize his his boots during the daytime but does wear the cam walker in the in the evenings.  Objective: Vital signs are stable he is alert oriented x3.  Much decrease in edema and erythema from previous evaluation to the left heel.  Still has pinpoint tenderness at the plantar fascia continue insertion site and tenderness along the lateral aspect of the foot.  At this point a we did discuss the MRI which did demonstrate a tear of the peroneus tendons and also a tear of the plantar fascia.  Assessment plantar fascial tear peroneal tendon tear.  Plan: Consented him today for repair of peroneal tendon and endoscopic plantar fasciotomy.  He states that he would like to do this in a month or so so he can save up some money in order to pay his bills while he is out.  Consented him today did discuss in detail great detail the time that he can be out of work he understands and is amenable to it did discuss possible postop complications which may include but not limited to postop pain placement infection recurrence need for surgery for correction of the correction also digit loss limb loss of life.  He signed out the patient's consent form and I will follow-up with him in the near future for surgical intervention.

## 2021-10-25 ENCOUNTER — Telehealth: Payer: Self-pay | Admitting: Urology

## 2021-10-25 NOTE — Telephone Encounter (Signed)
DOS - 11/15/21  EPF LEFT --- [id number] REPAIR TENDON PERONEAL LEFT --- [id number]  [employer] EFFECTIVE DATE 09/08/21  RECEIVED FAX FROM [employer] STATING CPT CODES 96722 AND 77375 HAS BEEN APPROVED, AUTH # [id number], GOOD FROM 10/24/21 - 01/21/22.

## 2021-11-06 ENCOUNTER — Other Ambulatory Visit: Payer: Self-pay

## 2021-11-06 ENCOUNTER — Encounter: Payer: Self-pay | Admitting: Student

## 2021-11-06 ENCOUNTER — Ambulatory Visit (INDEPENDENT_AMBULATORY_CARE_PROVIDER_SITE_OTHER): Payer: 59 | Admitting: Student

## 2021-11-06 VITALS — BP 127/91 | HR 68 | Temp 97.7°F | Ht 67.0 in | Wt 209.7 lb

## 2021-11-06 DIAGNOSIS — I10 Essential (primary) hypertension: Secondary | ICD-10-CM | POA: Diagnosis not present

## 2021-11-06 DIAGNOSIS — S86319A Strain of muscle(s) and tendon(s) of peroneal muscle group at lower leg level, unspecified leg, initial encounter: Secondary | ICD-10-CM | POA: Insufficient documentation

## 2021-11-06 DIAGNOSIS — Z0000 Encounter for general adult medical examination without abnormal findings: Secondary | ICD-10-CM

## 2021-11-06 DIAGNOSIS — S86312D Strain of muscle(s) and tendon(s) of peroneal muscle group at lower leg level, left leg, subsequent encounter: Secondary | ICD-10-CM

## 2021-11-06 NOTE — Assessment & Plan Note (Addendum)
Assessment: ?Patient here to follow-up on blood pressure recheck.  Current regimen of spironolactone 25 mg, HCTZ 25 mg and clonidine 0.2 mg twice daily.  Blood pressure reading today 127/91.  Patient denies feeling lightheaded or dizzy since the addition of spironolactone.  We will repeat BMP today. ? ?Plan: ?-BMP today ?-Continue spiro 25 mg daily, HCTZ 25 mg daily, clonidine .2 mg BID.   ? ?Addendum: BMP unremarkable, no electrolyte abnormalities and normal Cr.  ?

## 2021-11-06 NOTE — Progress Notes (Signed)
? ?CC: F/U Hypertension ? ?HPI: ? ?[name] is a 56 y.o. male with a past medical history stated below and presents today for blood pressure follow-up after starting spironolactone. Please see problem based assessment and plan for additional details. ? ?Past Medical History:  ?Diagnosis Date  ? Acute pancreatitis   ? Anxiety   ? Arthritis   ? Colonic adenoma   ? COPD (chronic obstructive pulmonary disease) ([place])   ? Depression   ? Diverticulitis of colon with perforation 08/2010  ? GERD (gastroesophageal reflux disease)   ? History of hiatal hernia   ? Hypertension   ? Polysubstance abuse ([place])   ? Renal insufficiency   ? Sleep apnea   ? Intolerant of CPAP  ? TIA (transient ischemic attack)   ? ? ?Current Outpatient Medications on File Prior to Visit  ?Medication Sig Dispense Refill  ? albuterol (PROVENTIL HFA;VENTOLIN HFA) 108 (90 Base) MCG/ACT inhaler Inhale 2 puffs into the lungs every 6 (six) hours as needed for wheezing or shortness of breath.    ? aspirin EC 81 MG EC tablet Take 1 tablet (81 mg total) by mouth daily. Swallow whole. 30 tablet 11  ? Buprenorphine HCl-Naloxone HCl 8-2 MG FILM PLACE 1 STRIP UNDER THE TONGUE IN THE MORNING AND AT BEDTIME 60 each 1  ? cloNIDine (CATAPRES) 0.1 MG tablet Take 2 tablets (0.2 mg total) by mouth 2 (two) times daily. (Patient taking differently: Take 0.2 mg by mouth daily.) 360 tablet 0  ? diclofenac Sodium (VOLTAREN) 1 % GEL Apply 2 g topically 4 (four) times daily. (Patient taking differently: Apply 2 g topically 2 (two) times daily as needed (pain).) 2 g 1  ? gabapentin (NEURONTIN) 300 MG capsule Take 300 mg by mouth at bedtime as needed (pain).    ? gabapentin (NEURONTIN) 300 MG capsule Take 1 capsule (300 mg total) by mouth 3 (three) times daily. 90 capsule 3  ? hydrochlorothiazide (HYDRODIURIL) 25 MG tablet Take 1 tablet (25 mg total) by mouth daily. 90 tablet 3  ? hydrocortisone (ANUSOL-HC) 2.5 % rectal cream Place 1 application rectally 2 (two) times  daily as needed for hemorrhoids. 28 g 1  ? meclizine (ANTIVERT) 25 MG tablet Take 1 tablet (25 mg total) by mouth 3 (three) times daily as needed for dizziness. 30 tablet 0  ? pantoprazole (PROTONIX) 40 MG tablet Take 1 tablet by mouth once daily (Patient taking differently: Take 40 mg by mouth 2 (two) times daily.) 90 tablet 0  ? sertraline (ZOLOFT) 25 MG tablet Take 25 mg by mouth daily.    ? sildenafil (VIAGRA) 100 MG tablet Take 1 tablet (100 mg total) by mouth as needed for erectile dysfunction. 20 tablet 1  ? spironolactone (ALDACTONE) 25 MG tablet Take 1 tablet (25 mg total) by mouth daily. 30 tablet 2  ? tamsulosin (FLOMAX) 0.4 MG CAPS capsule Take 0.4 mg by mouth daily.    ? ?No current facility-administered medications on file prior to visit.  ? ?Family History  ?Problem Relation Age of Onset  ? Stomach cancer Father   ? Lung cancer Father   ? Throat cancer Father   ? Cancer Father   ?     lung and throat cancer  ? Hypertension Father   ? Liver disease Cousin   ?     cirrhosis, etoh  ? Throat cancer Paternal Uncle   ? Lung cancer Paternal Uncle   ? Colon cancer Cousin 28  ? Heart disease Mother   ?  Hypertension Mother   ? Colon cancer Maternal Uncle   ? Throat cancer Paternal Uncle   ? Lung cancer Paternal Uncle   ? Throat cancer Paternal Uncle   ? Lung cancer Paternal Uncle   ? ?Social History  ? ?Socioeconomic History  ? Marital status: Single  ?  Spouse name: Not on file  ? Number of children: 0  ? Years of education: Not on file  ? Highest education level: Not on file  ?Occupational History  ? Not on file  ?Tobacco Use  ? Smoking status: Every Day  ?  Packs/day: 1.00  ?  Years: 37.00  ?  Pack years: 37.00  ?  Types: Cigarettes  ? Smokeless tobacco: Current  ?  Types: Chew  ? Tobacco comments:  ?  Trying to quit, has nicotine patches at home which have helped him quit before  ?Vaping Use  ? Vaping Use: Never used  ?Substance and Sexual Activity  ? Alcohol use: Not Currently  ? Drug use: Yes  ?   Frequency: 2.0 times per week  ?  Types: Marijuana, Benzodiazepines  ?  Comment: 09/17/21 occ marijuana  ? Sexual activity: Not on file  ?Other Topics Concern  ? Not on file  ?Social History Narrative  ? Not on file  ? ?Social Determinants of Health  ? ?Financial Resource Strain: Not on file  ?Food Insecurity: Not on file  ?Transportation Needs: Not on file  ?Physical Activity: Not on file  ?Stress: Not on file  ?Social Connections: Not on file  ?Intimate Partner Violence: Not on file  ? ? ?Review of Systems: ?ROS negative except for what is noted on the assessment and plan. ? ?There were no vitals filed for this visit. ? ? ?Physical Exam: ?Constitutional: well appearing, NAD ?HENT: normocephalic atraumatic, mucous membranes moist ?Eyes: conjunctiva non-erythematous ?Neck: supple ?Cardiovascular: regular rate and rhythm, no m/r/g ?Pulmonary/Chest: normal work of breathing on room air ?MSK: normal bulk and tone. Left walking boot in place ?Neurological: alert & oriented x 3 ?Skin: warm and dry ?Psych: normal mood and thought process ? ? ?Assessment & Plan:  ? ?See Encounters Tab for problem based charting. ? ?Patient discussed with Dr. Evette Oneal ? ?[name], D.O. ?[facility], PGY-2 ?Pager: [phone], Phone: [phone] ?Date 11/06/2021 Time 8:44 AM  ?

## 2021-11-06 NOTE — Patient Instructions (Addendum)
Thank you, [name] for allowing [place] to provide your care today. Today we discussed. ? ?Blood Pressure   ?Your blood pressure has improved since last visit. Please continue to take your spironolactone, clonidine, HCTZ.  We will be doing blood work today to check your electrolytes.  If you find yourself getting lightheaded or dizzy please check your blood pressure.  If you feel as though during these times you are having low blood pressure readings, please call our clinic. ? ?Pain management ?Please continue to take your Suboxone as prescribed.  Please take Tylenol 1000 mg every 6 hours for any breakthrough pain.  I recommend taking the Tylenol scheduled as it will help with the baseline pain.  ? ? ?I have ordered the following labs for you: ? ?Lab Orders  ?No laboratory test(s) ordered today  ?  ? ?Tests ordered today: ? ?Basic metabolic panel-to assess electrolytes and kidney function ? ?Referrals ordered today:  ? ?Referral Orders  ?No referral(s) requested today  ?  ? ?I have ordered the following medication/changed the following medications:  ? ?Stop the following medications: ?There are no discontinued medications.  ? ?Start the following medications: ?No orders of the defined types were placed in this encounter. ?  ? ?Follow up:  1 month follow-up OUD ? ?Remember: Please try to check your blood pressures at home and record them in a log. ? ?Should you have any questions or concerns please call the internal medicine clinic at [phone].   ? ?[name], D.O. ?[place] ? ? ?

## 2021-11-06 NOTE — Assessment & Plan Note (Addendum)
We will check hepatitis C screening today. ? ?Addendum:  ?Hep C ab negative ?

## 2021-11-06 NOTE — Assessment & Plan Note (Signed)
Assessment: ?Patient with peroneal tendon tear left foot.  Following with podiatry and planning for surgery this Friday per the patient.  He discussed pain regimen and how this will affect his Suboxone dosing.  Discussed that he can continue his current dose of Suboxone and take prescribed opioids by his podiatrist.  Also recommended Tylenol 1000 mg schedule weekly 6 hours.  He notes in the past he has been told to avoid NSAIDs due to his history of GERD and gastritis. ? ?He has been taking Xanax from a family member.  Discussed the risks of taking the Xanax with possible opioids postsurgery and Suboxone.  Patient knowledge understanding. ? ?Plan: ?-Management per podiatry, plan for surgery on Friday ?-Continue Suboxone as prescribed with Tylenol 1000 mg every 6 hours ?

## 2021-11-07 LAB — BMP8+ANION GAP
Anion Gap: 17 mmol/L (ref 10.0–18.0)
BUN/Creatinine Ratio: 17 (ref 9–20)
BUN: 19 mg/dL (ref 6–24)
CO2: 24 mmol/L (ref 20–29)
Calcium: 9.5 mg/dL (ref 8.7–10.2)
Chloride: 98 mmol/L (ref 96–106)
Creatinine, Ser: 1.14 mg/dL (ref 0.76–1.27)
Glucose: 91 mg/dL (ref 70–99)
Potassium: 4.7 mmol/L (ref 3.5–5.2)
Sodium: 139 mmol/L (ref 134–144)
eGFR: 75 mL/min/{1.73_m2} (ref >59)

## 2021-11-07 LAB — HCV AB W REFLEX TO QUANT PCR: HCV Ab: NONREACTIVE

## 2021-11-07 LAB — HCV INTERPRETATION

## 2021-11-07 NOTE — Progress Notes (Signed)
Internal Medicine Clinic Attending  Case discussed with Dr. Katsadouros  At the time of the visit.  We reviewed the resident's history and exam and pertinent patient test results.  I agree with the assessment, diagnosis, and plan of care documented in the resident's note.  

## 2021-11-14 ENCOUNTER — Other Ambulatory Visit: Payer: Self-pay | Admitting: Podiatry

## 2021-11-14 ENCOUNTER — Telehealth: Payer: Self-pay | Admitting: Podiatry

## 2021-11-14 MED ORDER — CEPHALEXIN 500 MG PO CAPS: 500.0000 mg | ORAL_CAPSULE | Freq: Three times a day (TID) | ORAL | 0 refills | Status: DC

## 2021-11-14 MED ORDER — ONDANSETRON HCL 4 MG PO TABS: 4.0000 mg | ORAL_TABLET | Freq: Three times a day (TID) | ORAL | 0 refills | Status: DC | PRN

## 2021-11-14 MED ORDER — OXYCODONE-ACETAMINOPHEN 10-325 MG PO TABS: 1.0000 | ORAL_TABLET | Freq: Three times a day (TID) | ORAL | 0 refills | Status: AC | PRN

## 2021-11-14 NOTE — Telephone Encounter (Signed)
Notified pharmacy.

## 2021-11-14 NOTE — Telephone Encounter (Signed)
[place] called wanting to notify you that this pt is currently taking suboxone. Please advise.  ?

## 2021-11-15 DIAGNOSIS — S86312D Strain of muscle(s) and tendon(s) of peroneal muscle group at lower leg level, left leg, subsequent encounter: Secondary | ICD-10-CM | POA: Diagnosis not present

## 2021-11-15 DIAGNOSIS — S96192A Other specified injury of muscle and tendon of long extensor muscle of toe at ankle and foot level, left foot, initial encounter: Secondary | ICD-10-CM | POA: Diagnosis not present

## 2021-11-15 DIAGNOSIS — M722 Plantar fascial fibromatosis: Secondary | ICD-10-CM | POA: Diagnosis not present

## 2021-11-18 ENCOUNTER — Telehealth: Payer: Self-pay | Admitting: *Deleted

## 2021-11-18 NOTE — Telephone Encounter (Signed)
Patient is calling because his pain is out of control. Can he take more of the pain medicine before the 8 hours is up? Please advise. ?

## 2021-11-18 NOTE — Telephone Encounter (Signed)
Notified the patient , said that he is taking 1/2 pill in between the dose but will get the ibuprofen as well, is easing up some since taking the half pill.

## 2021-11-20 ENCOUNTER — Other Ambulatory Visit: Payer: Self-pay

## 2021-11-20 ENCOUNTER — Encounter (HOSPITAL_COMMUNITY): Payer: Self-pay | Admitting: Emergency Medicine

## 2021-11-20 ENCOUNTER — Emergency Department (HOSPITAL_COMMUNITY): Payer: 59

## 2021-11-20 ENCOUNTER — Emergency Department (HOSPITAL_COMMUNITY)
Admission: EM | Admit: 2021-11-20 | Discharge: 2021-11-21 | Disposition: A | Discharge status: Home / Self Care | Payer: 59 | Attending: Emergency Medicine | Admitting: Emergency Medicine

## 2021-11-20 DIAGNOSIS — Z7982 Long term (current) use of aspirin: Secondary | ICD-10-CM | POA: Insufficient documentation

## 2021-11-20 DIAGNOSIS — R1084 Generalized abdominal pain: Secondary | ICD-10-CM | POA: Insufficient documentation

## 2021-11-20 DIAGNOSIS — R519 Headache, unspecified: Secondary | ICD-10-CM | POA: Insufficient documentation

## 2021-11-20 DIAGNOSIS — Z91040 Latex allergy status: Secondary | ICD-10-CM | POA: Diagnosis not present

## 2021-11-20 DIAGNOSIS — R109 Unspecified abdominal pain: Secondary | ICD-10-CM | POA: Diagnosis present

## 2021-11-20 LAB — URINALYSIS, ROUTINE W REFLEX MICROSCOPIC
Bacteria, UA: NONE SEEN
Bilirubin Urine: NEGATIVE
Glucose, UA: NEGATIVE mg/dL
Ketones, ur: 20 mg/dL — AB
Leukocytes,Ua: NEGATIVE
Nitrite: NEGATIVE
Protein, ur: NEGATIVE mg/dL
Specific Gravity, Urine: 1.021 (ref 1.005–1.030)
pH: 5 (ref 5.0–8.0)

## 2021-11-20 LAB — COMPREHENSIVE METABOLIC PANEL
ALT: 16 U/L (ref 0–44)
AST: 17 U/L (ref 15–41)
Albumin: 4.2 g/dL (ref 3.5–5.0)
Alkaline Phosphatase: 72 U/L (ref 38–126)
Anion gap: 13 (ref 5–15)
BUN: 30 mg/dL — ABNORMAL HIGH (ref 6–20)
CO2: 22 mmol/L (ref 22–32)
Calcium: 9.7 mg/dL (ref 8.9–10.3)
Chloride: 100 mmol/L (ref 98–111)
Creatinine, Ser: 1.32 mg/dL — ABNORMAL HIGH (ref 0.61–1.24)
GFR, Estimated: >60 mL/min (ref >60)
Glucose, Bld: 103 mg/dL — ABNORMAL HIGH (ref 70–99)
Potassium: 3.7 mmol/L (ref 3.5–5.1)
Sodium: 135 mmol/L (ref 135–145)
Total Bilirubin: 0.7 mg/dL (ref 0.3–1.2)
Total Protein: 8.9 g/dL — ABNORMAL HIGH (ref 6.5–8.1)

## 2021-11-20 LAB — CBC
HCT: 50.3 % (ref 39.0–52.0)
Hemoglobin: 17.7 g/dL — ABNORMAL HIGH (ref 13.0–17.0)
MCH: 31.9 pg (ref 26.0–34.0)
MCHC: 35.2 g/dL (ref 30.0–36.0)
MCV: 90.6 fL (ref 80.0–100.0)
Platelets: 272 10*3/uL (ref 150–400)
RBC: 5.55 MIL/uL (ref 4.22–5.81)
RDW: 12.6 % (ref 11.5–15.5)
WBC: 12.7 10*3/uL — ABNORMAL HIGH (ref 4.0–10.5)
nRBC: 0 % (ref 0.0–0.2)

## 2021-11-20 LAB — LIPASE, BLOOD: Lipase: 25 U/L (ref 11–51)

## 2021-11-20 LAB — LACTIC ACID, PLASMA
Lactic Acid, Venous: 1.1 mmol/L (ref 0.5–1.9)
Lactic Acid, Venous: 1 mmol/L (ref 0.5–1.9)

## 2021-11-20 MED ORDER — HYDROMORPHONE HCL 1 MG/ML IJ SOLN
1.0000 mg | Freq: Once | INTRAMUSCULAR | Status: AC
  Administered 2021-11-20: 1 mg via INTRAVENOUS
  Filled 2021-11-20: qty 1

## 2021-11-20 MED ORDER — FENTANYL CITRATE PF 50 MCG/ML IJ SOSY
50.0000 ug | PREFILLED_SYRINGE | Freq: Once | INTRAMUSCULAR | Status: AC
  Administered 2021-11-20: 50 ug via INTRAVENOUS
  Filled 2021-11-20: qty 1

## 2021-11-20 MED ORDER — IOHEXOL 300 MG/ML  SOLN
100.0000 mL | Freq: Once | INTRAMUSCULAR | Status: AC | PRN
  Administered 2021-11-20: 100 mL via INTRAVENOUS

## 2021-11-20 MED ORDER — SUCRALFATE 1 G PO TABS: 1.0000 g | ORAL_TABLET | Freq: Three times a day (TID) | ORAL | 0 refills | Status: DC

## 2021-11-20 MED ORDER — DICYCLOMINE HCL 10 MG/ML IM SOLN
20.0000 mg | Freq: Once | INTRAMUSCULAR | Status: DC
  Filled 2021-11-20: qty 2

## 2021-11-20 MED ORDER — METOCLOPRAMIDE HCL 5 MG/ML IJ SOLN
10.0000 mg | Freq: Once | INTRAMUSCULAR | Status: AC
  Administered 2021-11-20: 10 mg via INTRAVENOUS
  Filled 2021-11-20: qty 2

## 2021-11-20 MED ORDER — DIPHENHYDRAMINE HCL 50 MG/ML IJ SOLN
25.0000 mg | Freq: Once | INTRAMUSCULAR | Status: AC
  Administered 2021-11-20: 25 mg via INTRAVENOUS
  Filled 2021-11-20: qty 1

## 2021-11-20 MED ORDER — FAMOTIDINE 20 MG PO TABS: 20.0000 mg | ORAL_TABLET | Freq: Two times a day (BID) | ORAL | 0 refills | Status: DC

## 2021-11-20 MED ORDER — DICYCLOMINE HCL 10 MG/ML IM SOLN
20.0000 mg | Freq: Once | INTRAMUSCULAR | Status: AC
  Administered 2021-11-20: 20 mg via INTRAMUSCULAR
  Filled 2021-11-20: qty 2

## 2021-11-20 MED ORDER — KETOROLAC TROMETHAMINE 30 MG/ML IJ SOLN
15.0000 mg | Freq: Once | INTRAMUSCULAR | Status: AC
  Administered 2021-11-20: 15 mg via INTRAVENOUS
  Filled 2021-11-20: qty 1

## 2021-11-20 NOTE — ED Triage Notes (Signed)
Pt BIB by [facility] for abd pain and severe headache x 2 days ago worsening x 2 hours ago; pt also had tendon surgery to left foot, pt has been taking Keflex x 5 days  ?

## 2021-11-20 NOTE — ED Provider Notes (Signed)
?[facility] ?Provider Note ? ? ?CSN: [id number] ?Arrival date & time: 11/20/21  1748 ? ?  ? ?History ? ?Chief Complaint  ?Patient presents with  ? Abdominal Pain  ? ? ?[name] is a 56 y.o. male. ? ?HPI ?Patient presents abdominal pain, headache.  Patient had recent left flexor tendon on his foot repair, this was uncomplicated.  Notes over the past 3 days he has had increasing pain, headache.  No weakness in his extremities, no vomiting, there is nausea, and worsening abdominal pain as well as headache.  He has not taken his medication since 3 AM due to nausea, seemingly. ? ?Home Medications ?Prior to Admission medications   ?Medication Sig Start Date End Date Taking? Authorizing Provider  ?albuterol (PROVENTIL HFA;VENTOLIN HFA) 108 (90 Base) MCG/ACT inhaler Inhale 2 puffs into the lungs every 6 (six) hours as needed for wheezing or shortness of breath.    [provider]  ?aspirin EC 81 MG EC tablet Take 1 tablet (81 mg total) by mouth daily. Swallow whole. 04/27/20   Barton Dubois, MD  ?Buprenorphine HCl-Naloxone HCl 8-2 MG FILM PLACE 1 STRIP UNDER THE TONGUE IN THE MORNING AND AT BEDTIME 10/09/21   Lucious Groves, DO  ?cephALEXin (KEFLEX) 500 MG capsule Take 1 capsule (500 mg total) by mouth 3 (three) times daily. 11/14/21   Hyatt, Max T, DPM  ?cloNIDine (CATAPRES) 0.1 MG tablet Take 2 tablets (0.2 mg total) by mouth 2 (two) times daily. ?Patient taking differently: Take 0.2 mg by mouth daily. 07/24/21 10/22/21  Rehman, Areeg N, DO  ?diclofenac Sodium (VOLTAREN) 1 % GEL Apply 2 g topically 4 (four) times daily. ?Patient taking differently: Apply 2 g topically 2 (two) times daily as needed (pain). 03/18/21   Atway, Rayann N, DO  ?gabapentin (NEURONTIN) 300 MG capsule Take 300 mg by mouth at bedtime as needed (pain).    [provider]  ?gabapentin (NEURONTIN) 300 MG capsule Take 1 capsule (300 mg total) by mouth 3 (three) times daily. 10/01/21   Hyatt, Max T, DPM   ?hydrochlorothiazide (HYDRODIURIL) 25 MG tablet Take 1 tablet (25 mg total) by mouth daily. 05/27/21   Angelica Pou, MD  ?hydrocortisone (ANUSOL-HC) 2.5 % rectal cream Place 1 application rectally 2 (two) times daily as needed for hemorrhoids. 09/17/21   Annitta Needs, NP  ?meclizine (ANTIVERT) 25 MG tablet Take 1 tablet (25 mg total) by mouth 3 (three) times daily as needed for dizziness. 06/26/21   Loni Beckwith, PA-C  ?ondansetron (ZOFRAN) 4 MG tablet Take 1 tablet (4 mg total) by mouth every 8 (eight) hours as needed. 11/14/21   Hyatt, Max T, DPM  ?oxyCODONE-acetaminophen (PERCOCET) 10-325 MG tablet Take 1 tablet by mouth every 8 (eight) hours as needed for up to 7 days for pain. 11/14/21 11/21/21  Hyatt, Max T, DPM  ?pantoprazole (PROTONIX) 40 MG tablet Take 1 tablet by mouth once daily ?Patient taking differently: Take 40 mg by mouth 2 (two) times daily. 04/29/21   Rehman, Areeg N, DO  ?sertraline (ZOLOFT) 25 MG tablet Take 25 mg by mouth daily. 08/25/21   [provider]  ?sildenafil (VIAGRA) 100 MG tablet Take 1 tablet (100 mg total) by mouth as needed for erectile dysfunction. 04/09/21 04/09/22  Rehman, Areeg N, DO  ?spironolactone (ALDACTONE) 25 MG tablet Take 1 tablet (25 mg total) by mouth daily. 10/09/21   Lucious Groves, DO  ?tamsulosin (FLOMAX) 0.4 MG CAPS capsule Take 0.4 mg by  mouth daily. 08/05/21   [provider]  ?   ? ?Allergies    ?Codeine, Latex, Lisinopril, Morphine, and Adhesive [tape]   ? ?Review of Systems   ?Review of Systems  ?Constitutional:   ?     Per HPI, otherwise negative  ?HENT:    ?     Per HPI, otherwise negative  ?Respiratory:    ?     Per HPI, otherwise negative  ?Cardiovascular:   ?     Per HPI, otherwise negative  ?Gastrointestinal:  Positive for abdominal pain and nausea. Negative for vomiting.  ?Endocrine:  ?     Negative aside from HPI  ?Genitourinary:   ?     Neg aside from HPI   ?Musculoskeletal:   ?     Per HPI, otherwise negative  ?Skin:  Negative.   ?Neurological:  Negative for syncope.  ? ?Physical Exam ?Updated Vital Signs ?BP (!) 157/109   Pulse 92   Resp 12   Ht '5\' 7"'$  (1.702 m)   Wt 94.8 kg   SpO2 99%   BMI 32.73 kg/m?  ?Physical Exam ?Vitals and nursing note reviewed.  ?Constitutional:   ?   General: He is not in acute distress. ?   Appearance: He is well-developed.  ?HENT:  ?   Head: Normocephalic and atraumatic.  ?Eyes:  ?   Conjunctiva/sclera: Conjunctivae normal.  ?Cardiovascular:  ?   Rate and Rhythm: Normal rate and regular rhythm.  ?Pulmonary:  ?   Effort: Pulmonary effort is normal. No respiratory distress.  ?   Breath sounds: No stridor.  ?Abdominal:  ?   General: There is no distension.  ?   Tenderness: There is generalized abdominal tenderness. There is guarding.  ?Musculoskeletal:  ?   Comments: Left foot in immobilizer, distally unremarkable, patient moves his toes, states that he has no pain in this area.  ?Skin: ?   General: Skin is warm and dry.  ?Neurological:  ?   Mental Status: He is alert and oriented to person, place, and time.  ?   Motor: No weakness, tremor, atrophy or abnormal muscle tone.  ? ? ?ED Results / Procedures / Treatments   ?Labs ?(all labs ordered are listed, but only abnormal results are displayed) ?Labs Reviewed  ?CBC - Abnormal; Notable for the following components:  ?    Result Value  ? WBC 12.7 (*)   ? Hemoglobin 17.7 (*)   ? All other components within normal limits  ?LACTIC ACID, PLASMA  ?LIPASE, BLOOD  ?COMPREHENSIVE METABOLIC PANEL  ?URINALYSIS, ROUTINE W REFLEX MICROSCOPIC  ?LACTIC ACID, PLASMA  ? ? ?EKG ?None ? ?Radiology ?No results found. ? ?Procedures ?Procedures  ? ? ?Medications Ordered in ED ?Medications - No data to display ? ?ED Course/ Medical Decision Making/ A&P ?This patient presents to the ED for concern of abdominal pain, headache in the context of recent surgery, this involves an extensive number of treatment options, and is a complaint that carries with it a high risk of  complications and morbidity.  The differential diagnosis includes with recent leg surgery, as well as multiple abdominal surgery, concern for obstruction versus infection in the abdomen, lower suspicion for CNS dysfunction given the preserved neurovascular status on physical exam ? ? ?Co morbidities that complicate the patient evaluation ? ?Age, obesity, substance abuse ? ?Social Determinants of Health: ? ?Absence abuse ? ?Additional history obtained: ? ?Additional history and/or information obtained from chart review ?External records from outside source obtained  and reviewed including clinic note last week follow-up from new antihypertensive ? ? ?After the initial evaluation, orders, including: CT, labs were initiated. ? ? ?Patient placed on Cardiac and Pulse-Oximetry Monitors. ?The patient was maintained on a cardiac monitor.  The cardiac monitored showed an rhythm of 90 sinus normal ?The patient was also maintained on pulse oximetry. The readings were typically 100% room air normal ? ? ?On repeat evaluation of the patient improved headache has resolved, continues to complain of abdominal pain ? ?Lab Tests: ? ?I personally interpreted labs.  The pertinent results include: Reassuring findings, mild ketonuria, slight worsening of renal function, consistent with poor p.o. intake ? ?Imaging Studies ordered: ? ?I independently visualized and interpreted imaging which showed no acute intra-abdominal processes ?I agree with the radiologist interpretation ? ? ?Dispostion / Final MDM: ? ?After consideration of the diagnostic results and the patient's response to treatment, patient will require additional analgesia, he did improve substantially.  I again reviewed his CT scan, discussed with him.  He remains hemodynamically unremarkable, and with no evidence for bacteremia, sepsis, unremarkable abdominal exam, reassuring urinalysis, given his improvement here with Bentyl and narcotics via IV.  Patient discharged in stable  condition to follow-up as an outpatient. ? ?Final Clinical Impression(s) / ED Diagnoses ?Final diagnoses:  ?Generalized abdominal pain  ? ?  ?Carmin Muskrat, MD ?11/20/21 2346 ? ?

## 2021-11-20 NOTE — Discharge Instructions (Signed)
As discussed, your evaluation today has been largely reassuring.  But, it is important that you monitor your condition carefully, and do not hesitate to return to the ED if you develop new, or concerning changes in your condition. ? ?Otherwise, please follow-up with your physician for appropriate ongoing care. ? ?

## 2021-11-20 NOTE — ED Notes (Signed)
Patient transported to CT 

## 2021-11-21 ENCOUNTER — Ambulatory Visit (INDEPENDENT_AMBULATORY_CARE_PROVIDER_SITE_OTHER): Payer: 59 | Admitting: Podiatry

## 2021-11-21 ENCOUNTER — Encounter: Payer: Self-pay | Admitting: Podiatry

## 2021-11-21 VITALS — BP 171/124 | HR 84 | Temp 97.9°F

## 2021-11-21 MED ORDER — OXYCODONE-ACETAMINOPHEN 10-325 MG PO TABS
1.0000 | ORAL_TABLET | Freq: Three times a day (TID) | ORAL | 0 refills | Status: AC | PRN
Start: 2021-11-21 — End: 2021-11-28

## 2021-11-21 NOTE — Progress Notes (Signed)
Presents today for his first postop visit repair of peroneal tendon left and endoscopic plantar fasciotomy left with a cast.  He states that the first couple days were really bad and very tender however he is doing much better states he has about 5 to 8 pills left but he does want to run out.  States that he went to the emergency department because of pain in his stomach and his head he thought he felt nauseous.  Once there they told him that he had some type of gastric issue.  And that his blood pressure was quite high. ? ?Objective: Vital signs demonstrate elevated blood pressure.  He is alert oriented x3 presents today cast intact dry and clean no calf pain no popliteal pain no thigh pain he has good sensation to his toes with good range of motion.  Blood pressure demonstrates 165/118 was the lowest with a pulse of 89.  Temperature is 97.9.  He is emergency room doctor had recommended he follow-up with his primary care physician for his blood pressure medication. ? ?Assessment: Well-healing surgical foot. ? ?Plan: Follow-up with me in 1 week for cast removal.  Unguinal refill his pain medication. ?

## 2021-11-28 ENCOUNTER — Ambulatory Visit (INDEPENDENT_AMBULATORY_CARE_PROVIDER_SITE_OTHER): Payer: 59 | Admitting: Podiatry

## 2021-11-28 ENCOUNTER — Other Ambulatory Visit: Payer: Self-pay

## 2021-11-28 ENCOUNTER — Encounter: Payer: Self-pay | Admitting: Podiatry

## 2021-11-28 ENCOUNTER — Other Ambulatory Visit: Payer: Self-pay | Admitting: Podiatry

## 2021-11-28 VITALS — BP 137/93 | HR 64

## 2021-11-28 DIAGNOSIS — S86312D Strain of muscle(s) and tendon(s) of peroneal muscle group at lower leg level, left leg, subsequent encounter: Secondary | ICD-10-CM | POA: Diagnosis not present

## 2021-11-28 DIAGNOSIS — S93692D Other sprain of left foot, subsequent encounter: Secondary | ICD-10-CM

## 2021-11-28 DIAGNOSIS — Z9889 Other specified postprocedural states: Secondary | ICD-10-CM

## 2021-11-28 MED ORDER — OXYCODONE-ACETAMINOPHEN 10-325 MG PO TABS
1.0000 | ORAL_TABLET | Freq: Three times a day (TID) | ORAL | 0 refills | Status: AC | PRN
Start: 2021-11-28 — End: 2021-12-05

## 2021-11-28 NOTE — Progress Notes (Signed)
He presents today 2 weeks status post peroneal tendon repair and endoscopic fasciotomy with cast application left foot.  States that he seems to be doing pretty well. ? ?Objective: Vital signs are stable alert oriented x3 cast is intact mildly dirty on the bottom once this was removed after dressing was intact dry and clean.  Wound appears to be healing very nicely staples are intact margins well coapted sutures are intact to the medial lateral ports from the EPF.  See no signs of infection is got good passive range of motion. ? ? ? ?Assessment: Well-healing surgical foot. ? ?Plan: Redressed today dressed a compressive dressing placed her back in a nonweightbearing cast follow-up with him in 2 weeks for staple removal and cast removal.  May need to put him back in another cast just because he is a heavy smoker. ?

## 2021-12-03 ENCOUNTER — Encounter (HOSPITAL_COMMUNITY): Payer: Self-pay | Admitting: Gastroenterology

## 2021-12-03 NOTE — Progress Notes (Signed)
Attempted to obtain medical history via telephone, unable to reach at this time. I left a voicemail to return pre surgical testing department's phone call.  

## 2021-12-11 ENCOUNTER — Ambulatory Visit (HOSPITAL_COMMUNITY)
Admission: RE | Admit: 2021-12-11 | Discharge: 2021-12-11 | Disposition: A | Discharge status: Home / Self Care | Payer: 59 | Attending: Gastroenterology | Admitting: Gastroenterology

## 2021-12-11 ENCOUNTER — Encounter (HOSPITAL_COMMUNITY): Payer: Self-pay | Admitting: Gastroenterology

## 2021-12-11 ENCOUNTER — Encounter (HOSPITAL_COMMUNITY)
Admission: RE | Disposition: A | Discharge status: Home / Self Care | Payer: Self-pay | Source: Home / Self Care | Attending: Gastroenterology

## 2021-12-11 DIAGNOSIS — K222 Esophageal obstruction: Secondary | ICD-10-CM | POA: Insufficient documentation

## 2021-12-11 DIAGNOSIS — R1319 Other dysphagia: Secondary | ICD-10-CM

## 2021-12-11 DIAGNOSIS — R1310 Dysphagia, unspecified: Secondary | ICD-10-CM | POA: Diagnosis present

## 2021-12-11 HISTORY — PX: ESOPHAGEAL MANOMETRY: SHX5429

## 2021-12-11 SURGERY — MANOMETRY, ESOPHAGUS
Anesthesia: Choice

## 2021-12-11 MED ORDER — LIDOCAINE VISCOUS HCL 2 % MT SOLN
OROMUCOSAL | Status: AC
  Filled 2021-12-11: qty 15

## 2021-12-11 SURGICAL SUPPLY — 2 items
FACESHIELD LNG OPTICON STERILE (SAFETY) IMPLANT
GLOVE BIO SURGEON STRL SZ8 (GLOVE) ×6 IMPLANT

## 2021-12-11 NOTE — Progress Notes (Signed)
Esophageal Manometry done per protocol. Patient tolerated well without distress or complication.  

## 2021-12-12 ENCOUNTER — Ambulatory Visit (INDEPENDENT_AMBULATORY_CARE_PROVIDER_SITE_OTHER): Payer: 59 | Admitting: Podiatry

## 2021-12-12 ENCOUNTER — Other Ambulatory Visit: Payer: Self-pay

## 2021-12-12 ENCOUNTER — Ambulatory Visit (INDEPENDENT_AMBULATORY_CARE_PROVIDER_SITE_OTHER): Payer: 59 | Admitting: Student

## 2021-12-12 ENCOUNTER — Encounter: Payer: Self-pay | Admitting: Student

## 2021-12-12 ENCOUNTER — Encounter: Payer: Self-pay | Admitting: Podiatry

## 2021-12-12 ENCOUNTER — Other Ambulatory Visit: Payer: Self-pay | Admitting: Podiatry

## 2021-12-12 VITALS — BP 121/92 | HR 67 | Temp 98.2°F | Ht 67.0 in | Wt 203.5 lb

## 2021-12-12 DIAGNOSIS — Z720 Tobacco use: Secondary | ICD-10-CM

## 2021-12-12 DIAGNOSIS — I10 Essential (primary) hypertension: Secondary | ICD-10-CM

## 2021-12-12 DIAGNOSIS — S93692D Other sprain of left foot, subsequent encounter: Secondary | ICD-10-CM

## 2021-12-12 DIAGNOSIS — F1110 Opioid abuse, uncomplicated: Secondary | ICD-10-CM

## 2021-12-12 DIAGNOSIS — Z23 Encounter for immunization: Secondary | ICD-10-CM

## 2021-12-12 DIAGNOSIS — S86312D Strain of muscle(s) and tendon(s) of peroneal muscle group at lower leg level, left leg, subsequent encounter: Secondary | ICD-10-CM

## 2021-12-12 DIAGNOSIS — Z0000 Encounter for general adult medical examination without abnormal findings: Secondary | ICD-10-CM

## 2021-12-12 DIAGNOSIS — Z9889 Other specified postprocedural states: Secondary | ICD-10-CM

## 2021-12-12 MED ORDER — ZOSTER VAC RECOMB ADJUVANTED 50 MCG/0.5ML IM SUSR
0.5000 mL | Freq: Once | INTRAMUSCULAR | 1 refills | Status: DC
Start: 2021-12-12 — End: 2021-12-12

## 2021-12-12 MED ORDER — ZOSTER VAC RECOMB ADJUVANTED 50 MCG/0.5ML IM SUSR: 0.5000 mL | Freq: Once | INTRAMUSCULAR | 1 refills | Status: AC

## 2021-12-12 MED ORDER — OXYCODONE-ACETAMINOPHEN 10-325 MG PO TABS
1.0000 | ORAL_TABLET | Freq: Three times a day (TID) | ORAL | 0 refills | Status: AC | PRN
Start: 2021-12-12 — End: 2021-12-19

## 2021-12-12 MED ORDER — BUPRENORPHINE HCL-NALOXONE HCL 8-2 MG SL FILM: ORAL_FILM | SUBLINGUAL | 0 refills | Status: DC

## 2021-12-12 NOTE — Patient Instructions (Signed)
[name], ? ?It was a pleasure seeing you in the clinic today. Here is a summary what we talked about: ? ?1.  I will resume Suboxone.  Instruction to for Suboxone induction is down below. ? ?2.  Please continue clonidine, HCTZ and spironolactone for your blood pressure. ? ?3.  I will order a CT scan of your chest for lung cancer screening.  Please try nicotine patch and cut back on cigarettes use. ? ?Please return in 2 months, ? ?Dr. Alfonse Spruce  ? ?Instruction for starting buprenorphine-naloxone (Suboxone) at home ? ?You should not mix buprenorphine-naloxone with other drugs especially large amounts of alcohol or benzodiazepines (Valium, Klonopin, Xanax, Ativan). If you have taken any of these medication, please tell your healthcare team and do not take buprenorphine-naloxone.  ? ?You must wait until you are feeling signs of withdrawal from opiates (heroin, pain pills) before you take buprenorphine-naloxone.  If you do not wait long enough the medication will make you sicker.  If you do take it too soon and get sicker then wait until later when you feel signs of withdrawal listed below and then try again.  ? ?Signs that you are withdrawing: ?Anxiety, restlessness, can?t sit still ?Aches ?Nausea or sick to your stomach ?Goose-bumps ?Racing heart  ? ?You should have ALL of these symptoms before you start taking your first dose of buprenorphine-naloxone. If you are not sure call your healthcare team.   ? ?When it?s time to take your first dose ?Split your pill or film in half ?Make sure your mouth is empty of everything (no candy/gum/etc) ?Sit or stand, but do not lie down ?Swallow a sip of water to wet your mouth  ?Put the half of the tablet or film under your tongue. Do not suck or swallow it. It must stay there until it is completely dissolved. Try to not even swallow your spit during this time. Anything that you swallow will not make you feel better.  ? ?In 20 minutes: You should start feeling a little better. If you  feel worse then you started too early so you would wait a few hours and then try again later.   ? ?In one hour: You can take the other half of the pill or film the same way you took the first one.  ? ?In 2 hours: if you are still feeling symptoms of withdrawal listed above you can take another half a pill or film. You can repeat this if needed until you take a total of 2 pills or 2 films ('16mg'$ ). You may need less than this to control your symptoms.  You should adjust your dose so that you are taking one and half or two pills or films per day (12-'16mg'$  per day). At this dose you should have cut down on cravings and help with any withdrawal symptoms.  ? ?The next day:  In the morning you can take the same amount you took yesterday all at one time in the morning.  Expect a call from your team to see how you are doing.  ? ?If you have any questions or concerns at any time call your healthcare team. ? ?Clinic Number:  830am - 5pm: [phone] ?After Hours Number: [phone] - - Leave your number and expect a call back from a physician.  ?

## 2021-12-12 NOTE — Assessment & Plan Note (Signed)
Patient still smoke 1 pack a day for many years.  Michela Pitcher that he is ready to quit.  We talked about the risk of lung cancer, COPD and cardiovascular disease. ? ?He still has nicotine patch at home which he is will start using. ? ?-Order CT low-dose for lung cancer screening ?

## 2021-12-12 NOTE — Assessment & Plan Note (Signed)
Patient previously on Suboxone 8-2 mg twice daily.  His Suboxone was held in March due to his left peroneal tendon repair surgery.  He was placed on Percocet 10-325 mg.  ? ?His last Percocet was taken this morning.  Advised patient to hold Percocet until he experiences withdrawal symptoms and then starting Suboxone.  Instruction for Suboxone induction given. ? ?Patient reports using Xanax occasionally for his anxiety.  He obtained this medication from the street.  We talked about the risk of using Xanax with Suboxone and also go over the pain contract with him.  Patient verbalized understanding and will try his best to stop. ? ?-Follow-up in 2 months ?-Tox assure next visit ?

## 2021-12-12 NOTE — Progress Notes (Signed)
? ?  CC: OUD ? ?HPI: ? ?[name] is a 56 y.o. with past medical history of opioid use disorder, hypertension, COPD who presented to the clinic for refilling Suboxone.  He recently had a left peroneal tendon repair in March. ? ?Please see problem based charting for detail ? ?Past Medical History:  ?Diagnosis Date  ? Acute pancreatitis   ? Anxiety   ? Arthritis   ? Colonic adenoma   ? COPD (chronic obstructive pulmonary disease) ([place])   ? Depression   ? Diverticulitis of colon with perforation 08/2010  ? GERD (gastroesophageal reflux disease)   ? History of hiatal hernia   ? Hypertension   ? Polysubstance abuse ([place])   ? Renal insufficiency   ? Sleep apnea   ? Intolerant of CPAP  ? TIA (transient ischemic attack)   ? ?Review of Systems:  per HPI ? ?Physical Exam: ? ?Vitals:  ? 12/12/21 0919  ?BP: (!) 121/92  ?Pulse: 67  ?Temp: 98.2 ?F (36.8 ?C)  ?TempSrc: Oral  ?SpO2: 98%  ?Weight: 203 lb 8 oz (92.3 kg)  ?Height: '5\' 7"'$  (1.702 m)  ? ?Physical Exam ?Constitutional:   ?   General: He is not in acute distress. ?HENT:  ?   Head: Normocephalic.  ?Eyes:  ?   General:     ?   Right eye: No discharge.     ?   Left eye: No discharge.  ?   Conjunctiva/sclera: Conjunctivae normal.  ?Cardiovascular:  ?   Rate and Rhythm: Normal rate and regular rhythm.  ?Pulmonary:  ?   Effort: Pulmonary effort is normal. No respiratory distress.  ?   Breath sounds: Normal breath sounds.  ?Abdominal:  ?   General: There is no distension.  ?   Palpations: Abdomen is soft.  ?Musculoskeletal:  ?   Comments: Left lower extremity cast in place.  ?Neurological:  ?   General: No focal deficit present.  ?   Mental Status: He is alert.  ?Psychiatric:     ?   Mood and Affect: Mood normal.     ?   Behavior: Behavior normal.  ?  ? ?Assessment & Plan:  ? ?See Encounters Tab for problem based charting. ? ?Patient discussed with Dr. Philipp Ovens  ?

## 2021-12-12 NOTE — Assessment & Plan Note (Signed)
-   Has had 2 COVID shot.  Declined the third shot. ?- Advised patient to obtain Shingrix vaccine at his pharmacy ?

## 2021-12-12 NOTE — Progress Notes (Signed)
He presents today for a postop visit date of surgery 11/15/2021 he is 1 month now status post peroneal tendon repair and endoscopic plantar fasciotomy left EPF and cast application.  He denies fever chills nausea run muscle aches pains calf pain back pain chest pain shortness of breath. ? ?Presents today cast intact slightly dirty on the bottom.  Cast is removed sterile dressing was intact once removed demonstrates staples are intact margins appear to be well coapted sutures intact margins well coapted.  Good range of motion. ? ?Assessment: Well-healing surgical foot. ? ?Plan: Redressed the foot today placed him in his cam walker and he will continue nonweightbearing in the cam walker.  I will follow-up with him in about 2 weeks at which time we will start partial weightbearing. ?

## 2021-12-12 NOTE — Assessment & Plan Note (Signed)
Blood pressure 121/92.  Report adherence to his medication.  I am not sure why he was started on clonidine but it may help with his opioid withdrawal symptoms. ? ?-Will continue HCTZ 25 mg, spironolactone 25 mg and clonidine 0.2 mg BID ?-When patient is stable on Suboxone, can consider tapering off of clonidine ?

## 2021-12-17 NOTE — Progress Notes (Signed)
Internal Medicine Clinic Attending  Case discussed with Dr. Nguyen  At the time of the visit.  We reviewed the resident's history and exam and pertinent patient test results.  I agree with the assessment, diagnosis, and plan of care documented in the resident's note. 

## 2021-12-23 ENCOUNTER — Other Ambulatory Visit: Payer: Self-pay

## 2021-12-24 MED ORDER — SPIRONOLACTONE 25 MG PO TABS: 25.0000 mg | ORAL_TABLET | Freq: Every day | ORAL | 2 refills | Status: DC

## 2021-12-26 ENCOUNTER — Ambulatory Visit (INDEPENDENT_AMBULATORY_CARE_PROVIDER_SITE_OTHER): Payer: 59 | Admitting: Podiatry

## 2021-12-26 ENCOUNTER — Encounter: Payer: Self-pay | Admitting: Podiatry

## 2021-12-26 DIAGNOSIS — S86312D Strain of muscle(s) and tendon(s) of peroneal muscle group at lower leg level, left leg, subsequent encounter: Secondary | ICD-10-CM

## 2021-12-26 DIAGNOSIS — S93692D Other sprain of left foot, subsequent encounter: Secondary | ICD-10-CM

## 2021-12-26 DIAGNOSIS — Z9889 Other specified postprocedural states: Secondary | ICD-10-CM

## 2021-12-26 MED ORDER — OXYCODONE-ACETAMINOPHEN 10-325 MG PO TABS: 1.0000 | ORAL_TABLET | Freq: Three times a day (TID) | ORAL | 0 refills | Status: AC | PRN

## 2021-12-26 NOTE — Progress Notes (Signed)
He presents today for postop visit #4 date of surgery is 11/15/2021 so he is about 6 weeks out on his peroneal tendon repair left foot EPF left foot states that is still sore but not too bad.  Continues to walk in his cam walker which she has been putting weight on his barefoot as well. ? ?Objective: Vital signs stable he is alert and oriented x3 presents today with tenderness along the incision site of the lateral ankle and some tenderness in the heel and arch area.  There is no signs of calf pain there is no erythema cellulitis drainage or odor. ? ?Assessment: Well-healing surgical foot. ? ?Plan: Recommended that he stay in his cam boot.  I provided him with more narcotic which this will be his last narcotic dose.  We will try to let him go back to work in the next couple of weeks may need to consider physical therapy. ?

## 2022-01-09 ENCOUNTER — Ambulatory Visit (INDEPENDENT_AMBULATORY_CARE_PROVIDER_SITE_OTHER): Payer: 59 | Admitting: Podiatry

## 2022-01-09 ENCOUNTER — Other Ambulatory Visit: Payer: Self-pay | Admitting: Internal Medicine

## 2022-01-09 DIAGNOSIS — S86312D Strain of muscle(s) and tendon(s) of peroneal muscle group at lower leg level, left leg, subsequent encounter: Secondary | ICD-10-CM | POA: Diagnosis not present

## 2022-01-09 MED ORDER — OXYCODONE-ACETAMINOPHEN 5-325 MG PO TABS: 1.0000 | ORAL_TABLET | Freq: Three times a day (TID) | ORAL | 0 refills | Status: DC | PRN

## 2022-01-09 MED ORDER — OXYCODONE-ACETAMINOPHEN 5-325 MG PO TABS
1.0000 | ORAL_TABLET | Freq: Three times a day (TID) | ORAL | 0 refills | Status: AC | PRN
Start: 2022-01-09 — End: 2022-01-14

## 2022-01-09 NOTE — Patient Instructions (Signed)
Look for Voltaren gel at the pharmacy over the counter or online (also known as diclofenac 1% gel). Apply to the painful areas 3-4x daily with the supplied dosing card. Allow to dry for 10 minutes before going into socks/shoes ? ?Call to schedule PT ?Location: [address], [place], [place], [place], [place] ?Phone: [phone] ?

## 2022-01-13 NOTE — Progress Notes (Signed)
?  Subjective:  ?Patient ID: [name], male    DOB: [birth year],  MRN: [id number] ? ?Chief Complaint  ?Patient presents with  ? Routine Post Op  ?    POV #5 DOS 11/15/21  ---  REPAIR PERONEAL TENDON LEFT, EPF LEFT   ? ? ? ?56 y.o. male returns for post-op check.  He still having a decent amount of pain  ? ?Review of Systems: Negative except as noted in the HPI. Denies N/V/F/Ch. ? ? ?Objective:  ?There were no vitals filed for this visit. ?There is no height or weight on file to calculate BMI. ?Constitutional Well developed. ?Well nourished.  ?Vascular Foot warm and well perfused. ?Capillary refill normal to all digits.  Calf is soft and supple, no posterior calf or knee pain, negative Homans' sign  ?Neurologic Normal speech. ?Oriented to person, place, and time. ?Epicritic sensation to light touch grossly present bilaterally.  ?Dermatologic Incision is tender but well-healed nonhypertrophic no dehiscence or hypertrophic scar  ?Orthopedic: Tenderness to palpation noted about the surgical site.  Minor edema  ? ? ?Assessment:  ? ?1. Tear of peroneal tendon, left, subsequent encounter   ? ?Plan:  ?Patient was evaluated and treated and all questions answered. ? ?S/p foot surgery left ?-Progressing as expected post-operatively. ?-Continue WBAT, transition to Tri-Lock ankle brace and begin physical therapy I think movement will be good for him.  I recommended Voltaren gel.  I did refill his Percocet but advised that this will need to be continued to be tapered.  Physical therapy referral was sent.  Tri-Lock ankle brace was dispensed he will follow-up in a few weeks for reevaluation. ? ?Return in about 3 weeks (around 01/30/2022) for post op (new x-rays), pin removal.  ?

## 2022-01-15 ENCOUNTER — Ambulatory Visit (HOSPITAL_COMMUNITY): Payer: 59 | Attending: Podiatry | Admitting: Physical Therapy

## 2022-01-15 ENCOUNTER — Encounter (HOSPITAL_COMMUNITY): Payer: Self-pay | Admitting: Physical Therapy

## 2022-01-15 DIAGNOSIS — S86312S Strain of muscle(s) and tendon(s) of peroneal muscle group at lower leg level, left leg, sequela: Secondary | ICD-10-CM | POA: Insufficient documentation

## 2022-01-15 DIAGNOSIS — M25572 Pain in left ankle and joints of left foot: Secondary | ICD-10-CM | POA: Diagnosis not present

## 2022-01-15 DIAGNOSIS — Z720 Tobacco use: Secondary | ICD-10-CM | POA: Diagnosis present

## 2022-01-15 DIAGNOSIS — S86312D Strain of muscle(s) and tendon(s) of peroneal muscle group at lower leg level, left leg, subsequent encounter: Secondary | ICD-10-CM | POA: Insufficient documentation

## 2022-01-15 DIAGNOSIS — R262 Difficulty in walking, not elsewhere classified: Secondary | ICD-10-CM | POA: Insufficient documentation

## 2022-01-15 NOTE — Therapy (Signed)
?OUTPATIENT PHYSICAL THERAPY LOWER EXTREMITY EVALUATION ? ? ?Patient Name: [name] ?MRN: [id number] ?DOB:[birth year], 56 y.o., male ?Today's Date: 01/15/2022 ? ? PT End of Session - 01/15/22 0745   ? ? Visit Number 1   ? Number of Visits 8   ? Date for PT Re-Evaluation 02/12/22   ? Authorization Type [employer] (30 visit limit PT/OT/chiro, auth after 25th visit)   ? Authorization - Visit Number 1   ? Authorization - Number of Visits 30   ? Progress Note Due on Visit 8   ? PT Start Time 0750   ? PT Stop Time 0830   ? PT Time Calculation (min) 40 min   ? Activity Tolerance Patient tolerated treatment well   ? Behavior During Therapy [facility] for tasks assessed/performed   ? ?  ?  ? ?  ? ? ?Past Medical History:  ?Diagnosis Date  ? Acute pancreatitis   ? Anxiety   ? Arthritis   ? Colonic adenoma   ? COPD (chronic obstructive pulmonary disease) ([place])   ? Depression   ? Diverticulitis of colon with perforation 08/2010  ? GERD (gastroesophageal reflux disease)   ? History of hiatal hernia   ? Hypertension   ? Polysubstance abuse ([place])   ? Renal insufficiency   ? Sleep apnea   ? Intolerant of CPAP  ? TIA (transient ischemic attack)   ? ?Past Surgical History:  ?Procedure Laterality Date  ? APPENDECTOMY    ? BIOPSY  12/14/2017  ? Procedure: BIOPSY;  Surgeon: Daneil Dolin, MD;  [facility];  Service: Endoscopy;;  gastric ?  ? BIOPSY  10/13/2019  ? Procedure: BIOPSY;  Surgeon: Daneil Dolin, MD;  [facility];  Service: Endoscopy;;  esophagus  ? BIOPSY  05/20/2021  ? Procedure: BIOPSY;  Surgeon: Daneil Dolin, MD;  [facility];  Service: Endoscopy;;  ? clamp left in colon after abd surgery    ? COLON SURGERY  05/2011  ? [facility]. exp laparotomy with colostomy takedown, lysis of adhesions (3hours), loop ileostomy. complicated by abscess formation requiring percutaneous drainage  ? colonoscopy via stoma with endoscopy of Jeanette Caprice Pouch  04/2011  ? [facility]. two sessile polyps in  trv colon. complete resection but only partial retrieval. congested mucosa in area 3cm proximal to stoma, erythematous mucosa in Hartmann pouch. repeat tcs 8/2017path-adenomatous polyp  ? COLONOSCOPY WITH PROPOFOL N/A 12/14/2017  ? Dr. Gala Romney: 2 4 to 6 mm polyps in the cecum, tubular adenomas, single rectal polyp tubular adenoma, nonbleeding internal hemorrhoids.  Appears to be a surgical clip at the appendiceal orifice.  Next colonoscopy in 5 years  ? ESOPHAGEAL MANOMETRY N/A 12/11/2021  ? Procedure: ESOPHAGEAL MANOMETRY (EM);  Surgeon: Mauri Pole, MD;  Location: [facility];  Service: Endoscopy;  Laterality: N/A;  ? ESOPHAGOGASTRODUODENOSCOPY  03/05/2012  ? erosive reflux esophagitis, bulbar erosions, gastric erosions/petichae, bx showed gastritis, no H.Pylori. Procedure: ESOPHAGOGASTRODUODENOSCOPY (EGD);  Surgeon: Daneil Dolin, MD;  [facility];  Service: Endoscopy;  Laterality: N/A;  11:30  ? ESOPHAGOGASTRODUODENOSCOPY (EGD) WITH PROPOFOL N/A 12/14/2017  ? Dr. Gala Romney: Erosive reflux esophagitis with questionable mild stricturing/non-obstruction at this level status post dilation.  multiple gastric erosions in the stomach biopsy showed reactive gastropathy but no H. pylori.  ? ESOPHAGOGASTRODUODENOSCOPY (EGD) WITH PROPOFOL N/A 10/13/2019  ? negative Barrett's, normal stomach  ? ESOPHAGOGASTRODUODENOSCOPY (EGD) WITH PROPOFOL N/A 05/20/2021  ? inlet patch with superimposed nodule s/p biopsy. Normal esophagus  s/p dilation. Normal gastric mucosa. Normal duodenum. Reflux changes on path.  ? HERNIA REPAIR    ? KNEE SURGERY Right 2006  ? MALONEY DILATION N/A 12/14/2017  ? Procedure: MALONEY DILATION;  Surgeon: Daneil Dolin, MD;  [facility];  Service: Endoscopy;  Laterality: N/A;  ? MALONEY DILATION  05/20/2021  ? Procedure: MALONEY DILATION;  Surgeon: Daneil Dolin, MD;  [facility];  Service: Endoscopy;;  ? MULTIPLE EXTRACTIONS WITH ALVEOLOPLASTY  02/23/2012  ?  Procedure: MULTIPLE EXTRACION WITH ALVEOLOPLASTY;  Surgeon: Gae Bon, DDS;  Location: [place];  Service: Oral Surgery;  Laterality: Bilateral;  Removal of left mandibular torus, multiple extractions with alveoloplasty  ? POLYPECTOMY  12/14/2017  ? Procedure: POLYPECTOMY;  Surgeon: Daneil Dolin, MD;  [facility];  Service: Endoscopy;;  ? reversal of colostomy  10/2011  ? per patient, did not have report  ? right knee surgery    ? sigmoid colectomy with end colostomy, Hartmann's pouch  08/2010  ? [facility]. complicated diverticulitis  ? UMBILICAL HERNIA REPAIR    ? ?Patient Active Problem List  ? Diagnosis Date Noted  ? Peroneal tendon tear 11/06/2021  ? Rib pain 08/26/2021  ? Heel pain, bilateral 07/29/2021  ? Healthcare maintenance 07/29/2021  ? Gout 05/24/2021  ? Bilateral hand swelling 04/09/2021  ? Epidermoid cyst of skin 02/18/2021  ? Erectile disorder due to medical condition in male 01/10/2021  ? Left knee pain 12/11/2020  ? Hematuria 12/11/2020  ? Vitamin D deficiency 10/12/2020  ? Opioid use disorder, mild, abuse ([place]) 08/16/2020  ? Dizziness 05/23/2020  ? Cervical radiculopathy   ? Tobacco abuse   ? Hyperlipidemia   ? Hemorrhoids 12/06/2019  ? Chronic obstructive pulmonary disease ([place]) 12/14/2017  ? Esophageal dysphagia 09/24/2017  ? GERD (gastroesophageal reflux disease) 03/15/2012  ? Depression 10/05/2009  ? HTN (hypertension) 10/05/2009  ? ? ?PCP: Areeg Dutch Quint, DO ? ?REFERRING PROVIDER: Criselda Peaches, DPM  ? ?REFERRING DIAG: H54.562B (ICD-10-CM) - Tear of peroneal tendon, left, subsequent encounter  ? ?THERAPY DIAG:  ?Pain in left ankle and joints of left foot ? ?Strain of peroneal tendon, left, sequela ? ?Difficulty in walking, not elsewhere classified ? ?ONSET DATE: date of surgery is 11/15/2021  ? ?SUBJECTIVE:  ? ?SUBJECTIVE STATEMENT: ?Patient reports gradual strain  of  Left ankle over course of several months, starting at mid foot. After CT scan pt dx with Left peroneal tendon  tear and needing surgery. Surgery on 11/15/21.  Pt was given rx and instructed to use Voltaren gel, pt reports some improvement with use. Pt is to return to [place] around 02/04/2022 for post op (new x-rays). Patient now presents for first course of outpt PT.  ? ?PERTINENT HISTORY: ?COPD, GERD, HTN, mild CVA 2020, anxiety ? ?PAIN:  ?Are you having pain? Yes: NPRS scale: 8/10 ?Pain location: left lateral ankle and through arch of foot  ?Pain description: throbbing, intermittent ?Aggravating factors: unknown  ?Relieving factors: taking medications ? ?PRECAUTIONS: None ? ?WEIGHT BEARING RESTRICTIONS No ? ?FALLS:  ?Has patient fallen in last 6 months? Yes. Number of falls 1 slipped while wearing cast ? ?LIVING ENVIRONMENT: ?Lives with: lives with their partner ?Lives in: Mobile home ?Stairs: Yes: External: 6-7 steps; on right going up, on left going up, and can reach both ?Has following equipment at home: Crutches ? 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 ?PLOF: Independent, Independent with gait, and Independent  with transfers ? ?PATIENT GOALS walk and go back to work  ? ? ?OBJECTIVE:  ? ?DIAGNOSTIC FINDINGS: peroneal tendon left and endoscopic plantar fasciotomy left  ? ?PATIENT SURVEYS:  ?FOTO 43% functional  ? ?COGNITION: ? Overall cognitive status: Within functional limits for tasks assessed   ?  ?SENSATION: ?Light touch: Impaired  at L5/S1on left ? ?PALPATION: ?TTP at incision site. Mild swelling of left ankle ? ?LE ROM: ? PROM DF 14 ?  PF30 ?  EV 10 ?  INV 24 ? ?Active ROM Right ?01/15/2022 Left ?01/15/2022  ?Hip flexion    ?Hip extension    ?Hip abduction    ?Hip adduction    ?Hip internal rotation    ?Hip external rotation    ?Knee flexion    ?Knee extension    ?Ankle dorsiflexion WFL 8  ?Ankle plantarflexion WFL 26  ?Ankle inversion WFL 20  ?Ankle eversion WFL 6  ? (Blank rows = not tested) ? ?LE MMT: ? ?MMT Right ?01/15/2022 Left ?01/15/2022  ?Hip flexion    ?Hip  extension    ?Hip abduction    ?Hip adduction    ?Hip internal rotation    ?Hip external rotation    ?Knee flexion    ?Knee extension    ?Ankle dorsiflexion  4/5*  ?Ankle plantarflexion  4/5*(seated)  ?Ankle inv

## 2022-01-16 ENCOUNTER — Encounter (HOSPITAL_COMMUNITY): Payer: 59 | Admitting: Physical Therapy

## 2022-01-21 ENCOUNTER — Ambulatory Visit (HOSPITAL_COMMUNITY)
Admission: RE | Admit: 2022-01-21 | Discharge: 2022-01-21 | Disposition: A | Discharge status: Home / Self Care | Payer: 59 | Source: Ambulatory Visit | Attending: Internal Medicine | Admitting: Internal Medicine

## 2022-01-21 ENCOUNTER — Other Ambulatory Visit: Payer: Self-pay | Admitting: Podiatry

## 2022-01-21 ENCOUNTER — Ambulatory Visit (HOSPITAL_COMMUNITY): Payer: 59 | Admitting: Physical Therapy

## 2022-01-21 DIAGNOSIS — S86312S Strain of muscle(s) and tendon(s) of peroneal muscle group at lower leg level, left leg, sequela: Secondary | ICD-10-CM

## 2022-01-21 DIAGNOSIS — Z720 Tobacco use: Secondary | ICD-10-CM | POA: Insufficient documentation

## 2022-01-21 DIAGNOSIS — R262 Difficulty in walking, not elsewhere classified: Secondary | ICD-10-CM

## 2022-01-21 DIAGNOSIS — M25572 Pain in left ankle and joints of left foot: Secondary | ICD-10-CM

## 2022-01-21 NOTE — Therapy (Addendum)
OUTPATIENT PHYSICAL THERAPY TREATMENT   Patient Name: [name] MRN: [id number] DOB:March 10, 1966, 55 y.o., male Today's Date: 01/21/2022   PT End of Session - 01/21/22 0842     Visit Number 2    Number of Visits 8    Date for PT Re-Evaluation 02/12/22    Authorization Type [employer] (30 visit limit PT/OT/chiro, Josem Kaufmann after 25th visit)    Authorization - Visit Number 2    Authorization - Number of Visits 30    Progress Note Due on Visit 8    PT Start Time [phone]    PT Stop Time 0915    PT Time Calculation (min) 38 min    Activity Tolerance Patient tolerated treatment well    Behavior During Therapy [facility] for tasks assessed/performed             Past Medical History:  Diagnosis Date   Acute pancreatitis    Anxiety    Arthritis    Colonic adenoma    COPD (chronic obstructive pulmonary disease) ([place])    Depression    Diverticulitis of colon with perforation 08/2010   GERD (gastroesophageal reflux disease)    History of hiatal hernia    Hypertension    Polysubstance abuse ([place])    Renal insufficiency    Sleep apnea    Intolerant of CPAP   TIA (transient ischemic attack)    Past Surgical History:  Procedure Laterality Date   APPENDECTOMY     BIOPSY  12/14/2017   Procedure: BIOPSY;  Surgeon: Daneil Dolin, MD;  [facility];  Service: Endoscopy;;  gastric    BIOPSY  10/13/2019   Procedure: BIOPSY;  Surgeon: Daneil Dolin, MD;  [facility];  Service: Endoscopy;;  esophagus   BIOPSY  05/20/2021   Procedure: BIOPSY;  Surgeon: Daneil Dolin, MD;  [facility];  Service: Endoscopy;;   clamp left in colon after abd surgery     COLON SURGERY  05/2011   [facility]. exp laparotomy with colostomy takedown, lysis of adhesions (3hours), loop ileostomy. complicated by abscess formation requiring percutaneous drainage   colonoscopy via stoma with endoscopy of [name]  04/2011   [facility]. two sessile polyps in trv colon. complete  resection but only partial retrieval. congested mucosa in area 3cm proximal to stoma, erythematous mucosa in Hartmann pouch. repeat tcs 8/2017path-adenomatous polyp   COLONOSCOPY WITH PROPOFOL N/A 12/14/2017   Dr. Gala Romney: 2 4 to 6 mm polyps in the cecum, tubular adenomas, single rectal polyp tubular adenoma, nonbleeding internal hemorrhoids.  Appears to be a surgical clip at the appendiceal orifice.  Next colonoscopy in 5 years   ESOPHAGEAL MANOMETRY N/A 12/11/2021   Procedure: ESOPHAGEAL MANOMETRY (EM);  Surgeon: Mauri Pole, MD;  [facility]: [facility];  Service: Endoscopy;  Laterality: N/A;   ESOPHAGOGASTRODUODENOSCOPY  03/05/2012   erosive reflux esophagitis, bulbar erosions, gastric erosions/petichae, bx showed gastritis, no H.Pylori. Procedure: ESOPHAGOGASTRODUODENOSCOPY (EGD);  Surgeon: Daneil Dolin, MD;  [facility];  Service: Endoscopy;  Laterality: N/A;  11:30   ESOPHAGOGASTRODUODENOSCOPY (EGD) WITH PROPOFOL N/A 12/14/2017   Dr. Gala Romney: Erosive reflux esophagitis with questionable mild stricturing/non-obstruction at this level status post dilation.  multiple gastric erosions in the stomach biopsy showed reactive gastropathy but no H. pylori.   ESOPHAGOGASTRODUODENOSCOPY (EGD) WITH PROPOFOL N/A 10/13/2019   negative Barrett's, normal stomach   ESOPHAGOGASTRODUODENOSCOPY (EGD) WITH PROPOFOL N/A 05/20/2021   inlet patch with superimposed nodule s/p biopsy. Normal esophagus s/p dilation.  Normal gastric mucosa. Normal duodenum. Reflux changes on path.   HERNIA REPAIR     KNEE SURGERY Right 2006   MALONEY DILATION N/A 12/14/2017   Procedure: Venia Minks DILATION;  Surgeon: Daneil Dolin, MD;  [facility];  Service: Endoscopy;  Laterality: N/A;   MALONEY DILATION  05/20/2021   Procedure: Venia Minks DILATION;  Surgeon: Daneil Dolin, MD;  [facility];  Service: Endoscopy;;   MULTIPLE EXTRACTIONS WITH ALVEOLOPLASTY  02/23/2012   Procedure: MULTIPLE  EXTRACION WITH ALVEOLOPLASTY;  Surgeon: Gae Bon, DDS;  Location: [place];  Service: Oral Surgery;  Laterality: Bilateral;  Removal of left mandibular torus, multiple extractions with alveoloplasty   POLYPECTOMY  12/14/2017   Procedure: POLYPECTOMY;  Surgeon: Daneil Dolin, MD;  [facility];  Service: Endoscopy;;   reversal of colostomy  10/2011   per patient, did not have report   right knee surgery     sigmoid colectomy with end colostomy, Hartmann's pouch  08/2010   [facility]. complicated diverticulitis   UMBILICAL HERNIA REPAIR     Patient Active Problem List   Diagnosis Date Noted   Peroneal tendon tear 11/06/2021   Rib pain 08/26/2021   Heel pain, bilateral 07/29/2021   Healthcare maintenance 07/29/2021   Gout 05/24/2021   Bilateral hand swelling 04/09/2021   Epidermoid cyst of skin 02/18/2021   Erectile disorder due to medical condition in male 01/10/2021   Left knee pain 12/11/2020   Hematuria 12/11/2020   Vitamin D deficiency 10/12/2020   Opioid use disorder, mild, abuse ([place]) 08/16/2020   Dizziness 05/23/2020   Cervical radiculopathy    Tobacco abuse    Hyperlipidemia    Hemorrhoids 12/06/2019   Chronic obstructive pulmonary disease ([place]) 12/14/2017   Esophageal dysphagia 09/24/2017   GERD (gastroesophageal reflux disease) 03/15/2012   Depression 10/05/2009   HTN (hypertension) 10/05/2009    PCP: Perrin Smack Dutch Quint, DO  REFERRING PROVIDER: Criselda Peaches, DPM   REFERRING DIAG: [phone] (ICD-10-CM) - Tear of peroneal tendon, left, subsequent encounter.  Surgery 11/15/21  THERAPY DIAG:  Pain in left ankle and joints of left foot  Strain of peroneal tendon, left, sequela  Difficulty in walking, not elsewhere classified  ONSET DATE: date of surgery is 11/15/2021   SUBJECTIVE:   SUBJECTIVE STATEMENT: Patient reports returns to work tomorrow.  States he quit wearing the brace on Saturday and is overall doing well, some pain and soreness and  swelling at end of the day.  States walking barefoot is most painful but otherwise improving with reduced pain. Pt is to return to [place] around 02/04/2022 for post op (new x-rays).   PERTINENT HISTORY: COPD, GERD, HTN, mild CVA 2020, anxiety  PAIN:  Are you having pain? Yes: NPRS scale: 5/10 Pain location: left lateral ankle and through arch of foot  Pain description: throbbing, intermittent Aggravating factors: unknown  Relieving factors: taking medications  PRECAUTIONS: None  WEIGHT BEARING RESTRICTIONS No  FALLS:  Has patient fallen in last 6 months? Yes. 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 Mild swelling of left ankle  LE ROM:  PROM DF 14   PF30   EV 10   INV 24  Active ROM Right 01/15/2022 Left 01/15/2022  Hip flexion    Hip extension    Hip abduction    Hip adduction    Hip internal rotation    Hip external rotation    Knee flexion    Knee extension    Ankle dorsiflexion WFL 8  Ankle plantarflexion WFL 26  Ankle inversion WFL 20  Ankle eversion WFL 6   (Blank rows = not tested)  LE MMT:  MMT Right 01/15/2022 Left 01/15/2022  Hip flexion    Hip extension    Hip abduction    Hip  adduction    Hip internal rotation    Hip external rotation    Knee flexion    Knee extension    Ankle dorsiflexion  4/5*  Ankle plantarflexion  4/5*(seated)  Ankle inversion  4/5*  Ankle eversion  3+/5 *   (Blank rows = not tested)  *=pain    FUNCTIONAL TESTS:  SLS on R >20 seconds, Left 2 seconds . Tandem hold left forward >10 sec. Right forward unable to hold.  GAIT: Distance walked: 300' Assistive device utilized:  trilock ankle brace on left Level of assistance: Complete Independence Comments: decreased cadence, decreased push off on left, shortened stride length    TODAY'S TREATMENT: 01/21/22 Standing Heelraises on incline 2!0  Slant board stretch 3X30" Seated  ankle circles (CW/CCW)  and alphabet HEP review  Towel ankle inversion/eversion 5X  Toe curls/pickup on towel 2 minutes  BAPS level 3 each direction 10X (A/P, Rt/Lt, CW, CCW)  Eval:Reviewed and performed HEP   PATIENT EDUCATION:  Education details: Patient educated on exam findings, POC, scope of PT, HEP. Person educated: Patient Education method: Explanation, Demonstration, and Handouts Education comprehension: verbalized understanding, returned demonstration, verbal cues required, and tactile cues required    HOME EXERCISE PROGRAM: Access Code: [email or web address] - Seated Ankle Alphabet (Mirrored) - 2-3 x daily - 7 x weekly - 3 reps - Seated Ankle Circles - 2-3 x daily - 7 x weekly - 2 sets - 10 reps  ASSESSMENT:  CLINICAL IMPRESSION: Reveiwed goals and POC moving forward.  Pt complaint with ankle circles but states "couldn't do much" with the alphabet activity.  Increased ROM and strengthening today adding standing exercises as well as seated for all ankle motions.   Added seated BAPS with circles being most challenging for patient.  Cues to reduce substitution of upper LE and body movement with ankle exercises.  Patient will benefit from skilled PT services to improve ROM, strength and balance to return to prior  unrestricted lifestyle.     OBJECTIVE IMPAIRMENTS Abnormal gait, decreased balance, difficulty walking, decreased ROM, decreased strength, increased edema, impaired flexibility, impaired sensation, and pain.   ACTIVITY LIMITATIONS cleaning, community activity, occupation, yard work, and shopping.   PERSONAL FACTORS 3+ comorbidities: COPD, GERD, HTN, mild CVA 2020, anxiety  are also affecting patient's functional outcome.    REHAB POTENTIAL: Good  CLINICAL DECISION MAKING: Stable/uncomplicated  EVALUATION COMPLEXITY: Moderate   GOALS: Goals reviewed with patient? Yes  SHORT TERM GOALS:  Target date: 02/04/2022  Patient will be independent with HEP in order to improve functional outcomes. Baseline: given handout of 2 simple seated ankle therex Goal status: IN PROGRESS  2.  Patient will report at least 25% improvement in symptoms for improved quality of life. Baseline: pt reports he hasn't been able to do much and has been just sitting at home Goal status: IN PROGRESS  3.  Patient will report pain level <=6/10 with 15 minutes activity in order to prepare for return to work. Baseline: 8/10 Goal status: IN PROGRESS   LONG TERM GOALS: Target date: 02/18/2022  Patient will report at least 75% improvement in symptoms for improved quality of life. Baseline: pt reports he hasn't been able to do much and has been just sitting at home Goal status: IN PROGRESS  2.  Patient will improve FOTO score by at least 20 points in order to indicate improved tolerance to activity. Baseline: 43% functional Goal status: IN PROGRESS  3.  Patient will improve ROM for ankle Eversion AROM to 15 degrees to improve ankle mobility needed to safely meet demands of job environment. Baseline: 0-6 Goal status: IN PROGRESS  4.  Patient will report pain level <=4/10 with 30 minutes activity in order to prepare for return to work. Baseline: 8/10 Goal status: IN PROGRESS   PLAN: PT FREQUENCY: 2x/week  PT  DURATION: 4 weeks  PLANNED INTERVENTIONS: Therapeutic exercises, Therapeutic activity, Neuromuscular re-education, Balance training, Gait training, Patient/Family education, Joint manipulation, Joint mobilization, Stair training, DME instructions, Dry Needling, Electrical stimulation, Moist heat, scar mobilization, Splintting, Taping, and Manual therapy  PLAN FOR NEXT SESSION: continue to build upon HEP progressing active ROM and strengthening exercises, incorporate balance exercises.    Teena Irani, PTA/CLT, WTA [phone]  Teena Irani, PTA 01/21/2022, 8:43 AM

## 2022-01-23 ENCOUNTER — Telehealth (HOSPITAL_COMMUNITY): Payer: Self-pay | Admitting: Physical Therapy

## 2022-01-23 ENCOUNTER — Encounter (HOSPITAL_COMMUNITY): Payer: 59 | Admitting: Physical Therapy

## 2022-01-23 ENCOUNTER — Telehealth: Payer: Self-pay | Admitting: Student

## 2022-01-23 DIAGNOSIS — F1110 Opioid abuse, uncomplicated: Secondary | ICD-10-CM

## 2022-01-23 NOTE — Telephone Encounter (Signed)
No show #1- Called patient about missed visit today. Left VM and reminder of upcoming appointment.    4:31 PM, 01/23/22 Josue Hector PT DPT  Physical Therapist with [facility]  [phone]

## 2022-01-24 MED ORDER — BUPRENORPHINE HCL-NALOXONE HCL 8-2 MG SL FILM: ORAL_FILM | SUBLINGUAL | 0 refills | Status: DC

## 2022-01-24 NOTE — Telephone Encounter (Signed)
I called patient to inform him of a CT chest result.  Patient told me that he needs a refill of his Suboxone.  Patient picked up his Suboxone in April and has been taking 2 films daily.  He occasionally takes 3 films daily due to pain after surgery.    He was then prescribed 2 short courses of Percocet by his podiatrist.  States that he stopped Suboxone when he take Percocet.  States that his podiatrist will no longer prescribe Percocet for him.  Patient resumed Suboxone the next day when he ran out of Percocet.  He said that he did not want to experience any withdrawal symptoms so he resumed the Suboxone the next day.  Said that he has about 8 films left.  He has a follow-up appointment with Dr. Eulas Post on 02/18/2022.  I will prescribe a 30-day supply of Suboxone for him.  Advised patient to no longer take Percocets and try to be consistent with his dose.  Patient verbalizes understanding.

## 2022-01-28 ENCOUNTER — Ambulatory Visit (HOSPITAL_COMMUNITY): Payer: 59 | Admitting: Physical Therapy

## 2022-01-28 ENCOUNTER — Telehealth (HOSPITAL_COMMUNITY): Payer: Self-pay | Admitting: Physical Therapy

## 2022-01-28 NOTE — Telephone Encounter (Signed)
Today is 2nd consecutive NS.  Called patient and reports he has returned to work and doesn't feel he needs to come for therapy anymore.  Pt requests to be discharged.  Message sent to evaluating therapist to discharge patient at this time.   Teena Irani, PTA/CLT [employer]: [phone]

## 2022-01-31 ENCOUNTER — Encounter (HOSPITAL_COMMUNITY): Payer: 59

## 2022-01-31 ENCOUNTER — Encounter (HOSPITAL_COMMUNITY): Payer: Self-pay

## 2022-01-31 NOTE — Therapy (Signed)
  PHYSICAL THERAPY DISCHARGE SUMMARY  Visits from Start of Care: 2  Current functional level related to goals / functional outcomes: Not assessed as patient only came for eval and 1 visit and then self discharged.   Remaining deficits: Due to limited time since eval the following is presumed to be current deficits : Abnormal gait, decreased balance, difficulty walking, decreased ROM, decreased strength, increased edema, impaired flexibility, impaired sensation, and pain.   Education / Equipment: Government social research officer distributed   Patient agrees to discharge. Patient goals were  not formally assessed as pt self discharge after 1 treatment visit . Patient is being discharged due to  patients request after having returned to work.+

## 2022-02-04 ENCOUNTER — Encounter: Payer: 59 | Admitting: Podiatry

## 2022-02-05 ENCOUNTER — Encounter (HOSPITAL_COMMUNITY): Payer: 59

## 2022-02-06 ENCOUNTER — Encounter (HOSPITAL_COMMUNITY): Payer: 59

## 2022-02-06 ENCOUNTER — Ambulatory Visit (INDEPENDENT_AMBULATORY_CARE_PROVIDER_SITE_OTHER): Payer: 59

## 2022-02-06 ENCOUNTER — Ambulatory Visit (INDEPENDENT_AMBULATORY_CARE_PROVIDER_SITE_OTHER): Payer: 59 | Admitting: Podiatry

## 2022-02-06 DIAGNOSIS — S86312D Strain of muscle(s) and tendon(s) of peroneal muscle group at lower leg level, left leg, subsequent encounter: Secondary | ICD-10-CM | POA: Diagnosis not present

## 2022-02-06 MED ORDER — TRAMADOL HCL 50 MG PO TABS
50.0000 mg | ORAL_TABLET | Freq: Four times a day (QID) | ORAL | 0 refills | Status: AC | PRN
Start: 2022-02-06 — End: 2022-02-11

## 2022-02-10 ENCOUNTER — Encounter: Payer: Self-pay | Admitting: Podiatry

## 2022-02-10 NOTE — Progress Notes (Signed)
  Subjective:  Patient ID: [name], male    DOB: 1966-04-15,  MRN: [id number]  Chief Complaint  Patient presents with   Routine Post Op    POV #6 DOS 11/15/21  ---  REPAIR PERONEAL TENDON LEFT, EPF** new xray/pin removal     56 y.o. male returns for post-op check.  He still having a decent amount of pain   Review of Systems: Negative except as noted in the HPI. Denies N/V/F/Ch.   Objective:  There were no vitals filed for this visit. There is no height or weight on file to calculate BMI. Constitutional Well developed. Well nourished.  Vascular Foot warm and well perfused. Capillary refill normal to all digits.  Calf is soft and supple, no posterior calf or knee pain, negative Homans' sign  Neurologic Normal speech. Oriented to person, place, and time. Epicritic sensation to light touch grossly present bilaterally.  Dermatologic Incision is well-healed and not hypertrophic  Orthopedic: Good 5 out of 5 strength in all planes, edema has improved   Radiographs taken today show minimal degenerative changes, no notable fracture. Assessment:   1. Tear of peroneal tendon, left, subsequent encounter    Plan:  Patient was evaluated and treated and all questions answered.  S/p foot surgery left -Has improved with physical therapy.  Advised to wear the brace as needed.  Continue Voltaren gel as needed.  He did request further pain medication and I sent him a prescription for tramadol but advised we will need to taper this even further.  He will return to see Dr. Milinda Pointer in 3 months for final follow-up  Return in about 3 months (around 05/09/2022) for follow up from surgery.

## 2022-02-17 ENCOUNTER — Ambulatory Visit (INDEPENDENT_AMBULATORY_CARE_PROVIDER_SITE_OTHER): Payer: 59 | Admitting: Student

## 2022-02-17 VITALS — BP 134/90 | HR 77 | Temp 97.8°F | Ht 67.0 in | Wt 206.8 lb

## 2022-02-17 DIAGNOSIS — I10 Essential (primary) hypertension: Secondary | ICD-10-CM | POA: Diagnosis not present

## 2022-02-17 DIAGNOSIS — F1110 Opioid abuse, uncomplicated: Secondary | ICD-10-CM | POA: Diagnosis not present

## 2022-02-17 MED ORDER — BUPRENORPHINE HCL-NALOXONE HCL 8-2 MG SL FILM: ORAL_FILM | SUBLINGUAL | 0 refills | Status: DC

## 2022-02-17 MED ORDER — CLONIDINE HCL 0.1 MG PO TABS: 0.2000 mg | ORAL_TABLET | Freq: Every day | ORAL | 0 refills | Status: DC

## 2022-02-17 NOTE — Assessment & Plan Note (Signed)
Today's Vitals   02/17/22 1559 02/17/22 1637  BP: 134/90   Pulse: 77   Temp: 97.8 F (36.6 C)   TempSrc: Oral   SpO2: 97%   Weight: 206 lb 12.8 oz (93.8 kg)   Height: '5\' 7"'$  (1.702 m)   PainSc: 6  6   PainLoc: Foot    Body mass index is 32.39 kg/m.  Patient with history of HTN, on HCTZ '25mg'$  daily, clonidine 0.'2mg'$  daily, and spironolactone '25mg'$  daily. He reports having run out of his clonidine and thus needs refill today. BP is mildly elevated, likely due to not having taken clonidine today.  Plan: -refilled clonidine -continue hctz and spiro

## 2022-02-17 NOTE — Progress Notes (Signed)
   CC: f/u for OUD and HTN  HPI:  [name] is a 56 y.o. male with history listed below presenting to the [facility] for f/u for OUD and HTN. Please see individualized problem based charting for full HPI.  Past Medical History:  Diagnosis Date   Acute pancreatitis    Anxiety    Arthritis    Colonic adenoma    COPD (chronic obstructive pulmonary disease) ([facility])    Depression    Diverticulitis of colon with perforation 08/2010   GERD (gastroesophageal reflux disease)    History of hiatal hernia    Hypertension    Polysubstance abuse ([place])    Renal insufficiency    Sleep apnea    Intolerant of CPAP   TIA (transient ischemic attack)     Review of Systems:  Negative aside from that listed in individualized problem based charting.  Physical Exam:  Vitals:   02/17/22 1559  BP: 134/90  Pulse: 77  Temp: 97.8 F (36.6 C)  TempSrc: Oral  SpO2: 97%  Weight: 206 lb 12.8 oz (93.8 kg)  Height: '5\' 7"'$  (1.702 m)   Physical Exam Constitutional:      Appearance: He is obese. He is not ill-appearing.  HENT:     Mouth/Throat:     Mouth: Mucous membranes are moist.     Pharynx: Oropharynx is clear.  Eyes:     Extraocular Movements: Extraocular movements intact.     Conjunctiva/sclera: Conjunctivae normal.     Pupils: Pupils are equal, round, and reactive to light.  Cardiovascular:     Rate and Rhythm: Normal rate and regular rhythm.     Pulses: Normal pulses.     Heart sounds: Normal heart sounds. No murmur heard.    No gallop.  Pulmonary:     Effort: Pulmonary effort is normal.     Breath sounds: Normal breath sounds. No wheezing, rhonchi or rales.  Abdominal:     General: Bowel sounds are normal. There is no distension.     Palpations: Abdomen is soft.     Tenderness: There is no abdominal tenderness.  Musculoskeletal:        General: No swelling. Normal range of motion.  Skin:    General: Skin is warm and dry.  Neurological:     General: No focal deficit present.      Mental Status: He is alert and oriented to person, place, and time.  Psychiatric:        Mood and Affect: Mood normal.        Behavior: Behavior normal.      Assessment & Plan:   See Encounters Tab for problem based charting.  Patient discussed with Dr. Heber [place]

## 2022-02-17 NOTE — Patient Instructions (Signed)
Mr. Ziebell,  It was a pleasure seeing you in the clinic today.   I have refilled your suboxone films for an additional month. I have also refilled your clonidine. Please come back in 1 month for your next follow up visit.  Please call our clinic at [phone] if you have any questions or concerns. The best time to call is Monday-Friday from 9am-4pm, but there is someone available 24/7 at the same number. If you need medication refills, please notify your pharmacy one week in advance and they will send [place] a request.   Thank you for letting us take part in your care. We look forward to seeing you next time!

## 2022-02-17 NOTE — Assessment & Plan Note (Signed)
Patient has been adherent with suboxone therapy since last visit. He denies any cravings or relapses since his last visit. Suboxone is not completely controlling patient's pain following peroneal tendon repair by podiatry and was recently prescribed a short course of tramadol which has been helping him.  Plan: -refilled suboxone films BID for 1 month -f/u utox

## 2022-02-18 ENCOUNTER — Encounter: Payer: 59 | Admitting: Student

## 2022-02-19 NOTE — Progress Notes (Signed)
Internal Medicine Clinic Attending  Case discussed with the resident at the time of the visit.  We reviewed the resident's history and exam and pertinent patient test results.  I agree with the assessment, diagnosis, and plan of care documented in the resident's note.  

## 2022-02-22 LAB — TOXASSURE SELECT,+ANTIDEPR,UR

## 2022-03-18 ENCOUNTER — Ambulatory Visit: Payer: 59 | Admitting: Gastroenterology

## 2022-03-25 ENCOUNTER — Emergency Department (HOSPITAL_COMMUNITY)
Admission: EM | Admit: 2022-03-25 | Discharge: 2022-03-25 | Disposition: A | Discharge status: Home / Self Care | Payer: 59 | Attending: Emergency Medicine | Admitting: Emergency Medicine

## 2022-03-25 ENCOUNTER — Other Ambulatory Visit: Payer: Self-pay

## 2022-03-25 ENCOUNTER — Ambulatory Visit (INDEPENDENT_AMBULATORY_CARE_PROVIDER_SITE_OTHER): Payer: 59 | Admitting: Student

## 2022-03-25 ENCOUNTER — Encounter (HOSPITAL_COMMUNITY): Payer: Self-pay | Admitting: Emergency Medicine

## 2022-03-25 ENCOUNTER — Encounter: Payer: 59 | Admitting: Student

## 2022-03-25 ENCOUNTER — Encounter: Payer: Self-pay | Admitting: Student

## 2022-03-25 VITALS — BP 128/93 | HR 66 | Temp 97.5°F | Ht 67.0 in | Wt 211.4 lb

## 2022-03-25 DIAGNOSIS — F1110 Opioid abuse, uncomplicated: Secondary | ICD-10-CM | POA: Diagnosis not present

## 2022-03-25 DIAGNOSIS — Z5321 Procedure and treatment not carried out due to patient leaving prior to being seen by health care provider: Secondary | ICD-10-CM | POA: Insufficient documentation

## 2022-03-25 DIAGNOSIS — R42 Dizziness and giddiness: Secondary | ICD-10-CM | POA: Diagnosis present

## 2022-03-25 DIAGNOSIS — I10 Essential (primary) hypertension: Secondary | ICD-10-CM

## 2022-03-25 DIAGNOSIS — R519 Headache, unspecified: Secondary | ICD-10-CM | POA: Diagnosis not present

## 2022-03-25 DIAGNOSIS — G4700 Insomnia, unspecified: Secondary | ICD-10-CM | POA: Diagnosis not present

## 2022-03-25 DIAGNOSIS — F17210 Nicotine dependence, cigarettes, uncomplicated: Secondary | ICD-10-CM

## 2022-03-25 LAB — COMPREHENSIVE METABOLIC PANEL
ALT: 17 U/L (ref 0–44)
AST: 19 U/L (ref 15–41)
Albumin: 3.5 g/dL (ref 3.5–5.0)
Alkaline Phosphatase: 65 U/L (ref 38–126)
Anion gap: 8 (ref 5–15)
BUN: 12 mg/dL (ref 6–20)
CO2: 29 mmol/L (ref 22–32)
Calcium: 9.1 mg/dL (ref 8.9–10.3)
Chloride: 100 mmol/L (ref 98–111)
Creatinine, Ser: 1.23 mg/dL (ref 0.61–1.24)
GFR, Estimated: >60 mL/min (ref >60)
Glucose, Bld: 74 mg/dL (ref 70–99)
Potassium: 3.4 mmol/L — ABNORMAL LOW (ref 3.5–5.1)
Sodium: 137 mmol/L (ref 135–145)
Total Bilirubin: 0.5 mg/dL (ref 0.3–1.2)
Total Protein: 7.1 g/dL (ref 6.5–8.1)

## 2022-03-25 LAB — CBC
HCT: 44.5 % (ref 39.0–52.0)
Hemoglobin: 14.6 g/dL (ref 13.0–17.0)
MCH: 31.1 pg (ref 26.0–34.0)
MCHC: 32.8 g/dL (ref 30.0–36.0)
MCV: 94.7 fL (ref 80.0–100.0)
Platelets: 201 10*3/uL (ref 150–400)
RBC: 4.7 MIL/uL (ref 4.22–5.81)
RDW: 13 % (ref 11.5–15.5)
WBC: 8 10*3/uL (ref 4.0–10.5)
nRBC: 0 % (ref 0.0–0.2)

## 2022-03-25 MED ORDER — BUPRENORPHINE HCL-NALOXONE HCL 8-2 MG SL FILM: ORAL_FILM | SUBLINGUAL | 0 refills | Status: DC

## 2022-03-25 MED ORDER — MELATONIN 1 MG PO TABS: 1.0000 mg | ORAL_TABLET | Freq: Every day | ORAL | 0 refills | Status: DC

## 2022-03-25 NOTE — Assessment & Plan Note (Addendum)
Patient reports intermittent dizziness that started around noon yesterday. He has the dizziness when standing up quickly. States that it feels like the room is spinning. Endorses headache, nausea, vomiting, lightheadedness. Denies blurry vision, hearing loss, tinnitus, slurred speech. 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 Patient reports CVA in 2021. He is taking aspirin 81 mg.   On exam, he was lethargic but oriented x 3. Neuro exam significant for both eyes unable to look to the left. Other EOM intact. Remainder cranial nerves intact. No facial droop or slurred speech. Positive Romberg sign. Unable to do tandem walk. Finger-to-nose test normal. 5/5 strength in bilateral UE and LE. Sensation intact. Orthostatic BP were normal.   Patient was seen previously for similar dizziness and worked up. Negative for acute stroke then.   Plan -patient sent to ED for evaluation of possible CVA/TIA  ADDENDUM 7/19: Patient left ED yesterday without evaluation by provider. I tried to contact him on 7/19 but no response. Left voicemail to call back to the clinic.   ADDENDUM 7/20: Patient returned called and reports feeling better. Dizziness has resolved. Denies weakness, paresthesia, or slurred speech. Discussed with patient that if he does develop symptoms then he needs to go to ED for evaluation. Patient understands and agrees to plan. Will plan for f/u visit in 1-2 weeks.

## 2022-03-25 NOTE — ED Notes (Signed)
Patient left without being seen, states he is ready to leave

## 2022-03-25 NOTE — Progress Notes (Addendum)
CC: Dizziness, HTN and OUD f/u  HPI:  [name] is a 56 y.o. male living with a history stated below and presents today for dizziness, HTN and OUD follow-up. Please see problem based assessment and plan for additional details.  Past Medical History:  Diagnosis Date   Acute pancreatitis    Anxiety    Arthritis    Colonic adenoma    COPD (chronic obstructive pulmonary disease) (HCC)    Depression    Diverticulitis of colon with perforation 08/2010   GERD (gastroesophageal reflux disease)    History of hiatal hernia    Hypertension    Polysubstance abuse ([employer])    Renal insufficiency    Sleep apnea    Intolerant of CPAP   Stroke ([place])    TIA (transient ischemic attack)     Current Outpatient Medications on File Prior to Visit  Medication Sig Dispense Refill   albuterol (PROVENTIL HFA;VENTOLIN HFA) 108 (90 Base) MCG/ACT inhaler Inhale 2 puffs into the lungs every 6 (six) hours as needed for wheezing or shortness of breath.     aspirin EC 81 MG EC tablet Take 1 tablet (81 mg total) by mouth daily. Swallow whole. 30 tablet 11   cloNIDine (CATAPRES) 0.1 MG tablet Take 2 tablets (0.2 mg total) by mouth daily. 180 tablet 0   diclofenac Sodium (VOLTAREN) 1 % GEL APPLY 2 GRAMS  TOPICALLY 4 TIMES DAILY 200 g 0   famotidine (PEPCID) 20 MG tablet Take 1 tablet (20 mg total) by mouth 2 (two) times daily. 30 tablet 0   gabapentin (NEURONTIN) 300 MG capsule TAKE 1 CAPSULE BY MOUTH THREE TIMES DAILY 90 capsule 0   hydrochlorothiazide (HYDRODIURIL) 25 MG tablet Take 1 tablet (25 mg total) by mouth daily. 90 tablet 3   hydrocortisone (ANUSOL-HC) 2.5 % rectal cream Place 1 application rectally 2 (two) times daily as needed for hemorrhoids. 28 g 1   pantoprazole (PROTONIX) 40 MG tablet Take 1 tablet by mouth once daily (Patient taking differently: Take 40 mg by mouth 2 (two) times daily.) 90 tablet 0   sildenafil (VIAGRA) 100 MG tablet Take 1 tablet (100 mg total) by mouth as needed for  erectile dysfunction. 20 tablet 1   spironolactone (ALDACTONE) 25 MG tablet Take 1 tablet (25 mg total) by mouth daily. 30 tablet 2   sucralfate (CARAFATE) 1 g tablet Take 1 tablet (1 g total) by mouth 4 (four) times daily -  with meals and at bedtime. 21 tablet 0   No current facility-administered medications on file prior to visit.    Review of Systems: ROS negative except for what is noted on the assessment and plan.  Vitals:   03/25/22 1116  BP: (!) 128/93  Pulse: 66  Temp: (!) 97.5 F (36.4 C)  TempSrc: Oral  SpO2: 97%  Weight: 211 lb 6.4 oz (95.9 kg)  Height: '5\' 7"'$  (1.702 m)    Physical Exam Constitutional:      General: He is not in acute distress.    Appearance: He is obese. He is not diaphoretic.  Eyes:     Extraocular Movements:     Right eye: Abnormal extraocular motion present.     Left eye: Abnormal extraocular motion present.     Conjunctiva/sclera: Conjunctivae normal.     Comments: Both eyes unable to look left. Other extraocular movements intact.  Cardiovascular:     Rate and Rhythm: Normal rate and regular rhythm.  Pulmonary:     Effort: Pulmonary  effort is normal.     Breath sounds: Normal breath sounds.  Skin:    General: Skin is warm and dry.  Neurological:     Mental Status: He is oriented to person, place, and time. He is lethargic.     Cranial Nerves: No dysarthria or facial asymmetry.     Sensory: Sensation is intact.     Coordination: Romberg sign positive. Finger-Nose-Finger Test normal.     Gait: Tandem walk abnormal.     Comments: CN 2, 5, 7-8, 11-12 tested and intact. Strength 5/5 in bilateral UE and 5/5 in bilateral LE.  Psychiatric:        Mood and Affect: Mood normal.        Behavior: Behavior normal.      Assessment & Plan:   Dizziness Patient reports intermittent dizziness that started around noon yesterday. He has the dizziness when standing up quickly. States that it feels like the room is spinning. Endorses headache,  nausea, vomiting, lightheadedness. Denies blurry vision, hearing loss, tinnitus, slurred speech. 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 Patient reports CVA in 2021. He is taking aspirin 81 mg.   On exam, he was lethargic but oriented x 3. Neuro exam significant for both eyes unable to look to the left. Other EOM intact. Remainder cranial nerves intact. No facial droop or slurred speech. Positive Romberg sign. Unable to do tandem walk. Finger-to-nose test normal. 5/5 strength in bilateral UE and LE. Sensation intact. Orthostatic BP were normal.   Patient was seen previously for similar dizziness and worked up. Negative for acute stroke then.   Plan -patient sent to ED for evaluation of possible CVA/TIA  ADDENDUM 7/19: Patient left ED yesterday without evaluation by provider. I tried to contact him on 7/19 but no response. Left voicemail to call back to the clinic.   ADDENDUM 7/20: Patient returned called and reports feeling better. Dizziness has resolved. Denies weakness, paresthesia, or slurred speech. Discussed with patient that if he does develop symptoms then he needs to go to ED for evaluation. Patient understands and agrees to plan. Will plan for f/u visit in 1-2 weeks.   HTN (hypertension) Today's Vitals   03/25/22 1116  BP: (!) 128/93  Pulse: 66  Temp: (!) 97.5 F (36.4 C)  TempSrc: Oral  SpO2: 97%  Weight: 211 lb 6.4 oz (95.9 kg)  Height: '5\' 7"'$  (1.702 m)   Body mass index is 33.11 kg/m.   BP today in range today. He is on HCTZ 25 mg daily, clonidine 0.2 mg daily and spironolactone 25 mg daily.  Denies chest pain or SOB.  Plan -continue with HCTZ, clonidine and spironolactone  Opioid use disorder, mild, abuse ([place]) Patient presents to clinic today for OUD follow-up. Suboxone is controlling his neck and back pain. Denies any cravings or relapses since last visit.  Patient reports use of Xanax for sleep. He obtained them from a family  relative. He has tried another medication but reports making him too drowsy. We discussed in detail the danger of using benzodiazepines and suboxone together. He is willing to try other medications to help with his sleep.   Plan -refilled suboxone films BID for 1 month -melatonin 1 mg at bedtime -advised patient to stop Xanax use -f/u in 1 month    Patient seen with Dr. Tacy Learn, D.O. [facility], PGY-1 Phone: [phone] Date 03/27/2022 Time 5:24 PM

## 2022-03-25 NOTE — Progress Notes (Signed)
1210PM - pt to ER via w/c; belongings taken; accompanied by RN.

## 2022-03-25 NOTE — Assessment & Plan Note (Addendum)
Patient presents to clinic today for OUD follow-up. Suboxone is controlling his neck and back pain. Denies any cravings or relapses since last visit.  Patient reports use of Xanax for sleep. He obtained them from a family relative. He has tried another medication but reports making him too drowsy. We discussed in detail the danger of using benzodiazepines and suboxone together. He is willing to try other medications to help with his sleep.   Plan -refilled suboxone films BID for 1 month -melatonin 1 mg at bedtime -advised patient to stop Xanax use -f/u in 1 month

## 2022-03-25 NOTE — ED Provider Triage Note (Signed)
Emergency Medicine Provider Triage Evaluation Note  [name] , a 56 y.o. male  was evaluated in triage.  Pt complains of dizziness. States that he has been dizzy intermittently since his stroke in 2021. States that today he was more dizzy than normal and had a headache. States that he doesn't normally get headaches. Went to internal medicine and was sent here for CVA/TIA work-up. States that since getting here his symptoms have mostly resolved and his headache is now very mild. States he suspects he got hot at work yesterday which triggered his symptoms which has happened previously. Denies any chest pain or shortness of breath  Review of Systems  Positive:  Negative:   Physical Exam  BP 103/87 (BP Location: Left Arm)   Pulse 61   Temp 97.8 F (36.6 C) (Oral)   Resp 18   SpO2 96%  Gen:   Awake, no distress   Resp:  Normal effort  MSK:   Moves extremities without difficulty  Other:  Alert and oriented and neurologically intact without deficits. Ambulatory with normal gait  Medical Decision Making  Medically screening exam initiated at 1:36 PM.  Appropriate orders placed.  [name] was informed that the remainder of the evaluation will be completed by another provider, this initial triage assessment does not replace that evaluation, and the importance of remaining in the ED until their evaluation is complete.     Nestor Lewandowsky 03/25/22 1339

## 2022-03-25 NOTE — Assessment & Plan Note (Addendum)
Today's Vitals   03/25/22 1116  BP: (!) 128/93  Pulse: 66  Temp: (!) 97.5 F (36.4 C)  TempSrc: Oral  SpO2: 97%  Weight: 211 lb 6.4 oz (95.9 kg)  Height: '5\' 7"'$  (1.702 m)   Body mass index is 33.11 kg/m.   BP today in range today. He is on HCTZ 25 mg daily, clonidine 0.2 mg daily and spironolactone 25 mg daily.  Denies chest pain or SOB.  Plan -continue with HCTZ, clonidine and spironolactone

## 2022-03-25 NOTE — ED Triage Notes (Signed)
Reports intermittent dizziness since having a stroke in 2021.  States he got hot yesterday and had dizziness that has continued today.  Denies pain.  No arm drift.  Denies numbness/weakness except chronic numbness to bilateral hands.

## 2022-03-25 NOTE — Progress Notes (Signed)
To ED via w/c;accompanied by RN.

## 2022-03-26 MED ORDER — BUPRENORPHINE HCL-NALOXONE HCL 8-2 MG SL FILM: ORAL_FILM | SUBLINGUAL | 0 refills | Status: DC

## 2022-03-26 NOTE — Addendum Note (Signed)
Addended by: Gilles Chiquito B on: 03/26/2022 09:18 AM   Modules accepted: Orders

## 2022-03-27 NOTE — Progress Notes (Signed)
Internal Medicine Clinic Attending  I saw and evaluated the patient.  I personally confirmed the key portions of the history and exam documented by Dr. Markus Jarvis and I reviewed pertinent patient test results.  The assessment, diagnosis, and plan were formulated together and I agree with the documentation in the resident's note with the following clarifications. [name] presented with acute onset of persistent dizziness starting the day prior to clinic visit. He reported he has had similar symptoms in the past but this time they started suddenly at work and have not resolved. He appeared lethargic in clinic but denied excessive sleepiness. His neurologic exam was notable for inability to move both eyes to the left, otherwise no abnormal eye movements, nystagmus. HINTS exam performed without corrective saccade or skew deviation, although exam limited by patient's ability to keep his eyes wide open. Orthostatics negative. He was unsteady with rising from a seated position and Romberg positive. Unable to complete tandem gait without significant instability. Given the above findings, we recommended ED evaluation for possible CVA/TIA ruleout. [name] was agreeable, however it appears he left the ED without being seen. Dr. Markus Jarvis continues to try and reach him for further evaluation.

## 2022-04-16 ENCOUNTER — Encounter: Payer: Self-pay | Admitting: Internal Medicine

## 2022-04-16 ENCOUNTER — Ambulatory Visit (INDEPENDENT_AMBULATORY_CARE_PROVIDER_SITE_OTHER): Payer: 59 | Admitting: Internal Medicine

## 2022-04-16 DIAGNOSIS — R42 Dizziness and giddiness: Secondary | ICD-10-CM

## 2022-04-16 NOTE — Assessment & Plan Note (Addendum)
The patient presents to the [facility] for a 2 week follow up of his dizziness. In July, the patient was seen and noted that he was dizzy when standing up too quickly and he described feeling as if the room were spinning. He had associated nausea, vomiting, lightheadedness, and a headache, without any blurry vision, hearing loss, or speech changes. Of note, he does have a hx of a CVA in 2021. The patient was sent to the ED from the [facility] at that time to rule out CVA/TIA, as he was not able move his eyes towards the left and he was having dizziness. He left from the ED without being seen by a provider due to a long wait, however, his symptoms improved. The patient works outdoors and notes that the temperature was extremely hot last week, which may have contributed to his symptoms.  Today the patient states that his dizziness is improved, although he does occasionally have some dizziness when he moves from sitting to standing rather quickly.  He has been making an effort to stay hydrated at work and has not had any dizzy spells at work and also denies any syncopal episodes or falls.  He denies any blurry vision, hearing loss, tinnitus, speech changes, nausea, vomiting, or headache.  On exam patient's Romberg test is positive (same as it was upon last [facility] visit).  His cranial nerves are intact, although he does have delayed extraocular muscle movement in the horizontal plane.  He is able to move his eyes to the left and right, but the movement is not fluid/and is delayed.  His strength is 5 out of 5 in his bilateral upper and lower extremities.  He has normal finger-to-nose testing bilaterally.  Plan: - Will obtain MRI brain to rule out CVA from 2 weeks ago (unneccessary sent to the ED as his symptoms are improved and this occurred 2 weeks ago) - if patient had a CVA, will refer to neuro and will get him on goal directed therapy  - Advised patient to stay hydrated and increase fluid intake, especially while at work

## 2022-04-16 NOTE — Patient Instructions (Signed)
Thank you, [name] for allowing [place] to provide your care today. Today we discussed:  Dizziness: Please be sure to stay hydrated while you are working, especially on those hot days! We are going to get an MRI done (they will call you to schedule) to make sure you didn't have a stroke when you were here 2 weeks ago.   I have ordered the following labs for you:  Lab Orders  No laboratory test(s) ordered today     Tests ordered today:  MRI brain  Referrals ordered today:   Referral Orders  No referral(s) requested today     I have ordered the following medication/changed the following medications:   Stop the following medications: There are no discontinued medications.   Start the following medications: No orders of the defined types were placed in this encounter.    Follow up:  4 weeks for dizziness      Should you have any questions or concerns please call the internal medicine clinic at [phone].     Buddy Duty, D.O. [place]

## 2022-04-16 NOTE — Progress Notes (Signed)
CC: 2 week follow up of dizziness  HPI:  [name] is a 56 y.o. male living with a history stated below and presents today for dizziness. Please see problem based assessment and plan for additional details.  Past Medical History:  Diagnosis Date   Acute pancreatitis    Anxiety    Arthritis    Colonic adenoma    COPD (chronic obstructive pulmonary disease) (HCC)    Depression    Diverticulitis of colon with perforation 08/2010   GERD (gastroesophageal reflux disease)    History of hiatal hernia    Hypertension    Polysubstance abuse ([place])    Renal insufficiency    Sleep apnea    Intolerant of CPAP   Stroke ([place])    TIA (transient ischemic attack)     Current Outpatient Medications on File Prior to Visit  Medication Sig Dispense Refill   albuterol (PROVENTIL HFA;VENTOLIN HFA) 108 (90 Base) MCG/ACT inhaler Inhale 2 puffs into the lungs every 6 (six) hours as needed for wheezing or shortness of breath.     aspirin EC 81 MG EC tablet Take 1 tablet (81 mg total) by mouth daily. Swallow whole. 30 tablet 11   Buprenorphine HCl-Naloxone HCl 8-2 MG FILM PLACE 1 STRIP UNDER THE TONGUE IN THE MORNING AND AT BEDTIME 60 each 0   cloNIDine (CATAPRES) 0.1 MG tablet Take 2 tablets (0.2 mg total) by mouth daily. 180 tablet 0   diclofenac Sodium (VOLTAREN) 1 % GEL APPLY 2 GRAMS  TOPICALLY 4 TIMES DAILY 200 g 0   famotidine (PEPCID) 20 MG tablet Take 1 tablet (20 mg total) by mouth 2 (two) times daily. 30 tablet 0   gabapentin (NEURONTIN) 300 MG capsule TAKE 1 CAPSULE BY MOUTH THREE TIMES DAILY 90 capsule 0   hydrochlorothiazide (HYDRODIURIL) 25 MG tablet Take 1 tablet (25 mg total) by mouth daily. 90 tablet 3   hydrocortisone (ANUSOL-HC) 2.5 % rectal cream Place 1 application rectally 2 (two) times daily as needed for hemorrhoids. 28 g 1   melatonin 1 MG TABS tablet Take 1 tablet (1 mg total) by mouth at bedtime. 30 tablet 0   pantoprazole (PROTONIX) 40 MG tablet Take 1 tablet by  mouth once daily (Patient taking differently: Take 40 mg by mouth 2 (two) times daily.) 90 tablet 0   sildenafil (VIAGRA) 100 MG tablet Take 1 tablet (100 mg total) by mouth as needed for erectile dysfunction. 20 tablet 1   spironolactone (ALDACTONE) 25 MG tablet Take 1 tablet (25 mg total) by mouth daily. 30 tablet 2   sucralfate (CARAFATE) 1 g tablet Take 1 tablet (1 g total) by mouth 4 (four) times daily -  with meals and at bedtime. 21 tablet 0   No current facility-administered medications on file prior to visit.    Family History  Problem Relation Age of Onset   Stomach cancer Father    Lung cancer Father    Throat cancer Father    Cancer Father        lung and throat cancer   Hypertension Father    Liver disease Cousin        cirrhosis, etoh   Throat cancer Paternal Uncle    Lung cancer Paternal Uncle    Colon cancer Cousin 45   Heart disease Mother    Hypertension Mother    Colon cancer Maternal Uncle    Throat cancer Paternal Uncle    Lung cancer Paternal Uncle    Throat cancer  Paternal Uncle    Lung cancer Paternal Uncle     Social History   Socioeconomic History   Marital status: Single    Spouse name: Not on file   Number of children: 0   Years of education: Not on file   Highest education level: Not on file  Occupational History   Not on file  Tobacco Use   Smoking status: Every Day    Packs/day: 1.00    Years: 37.00    Total pack years: 37.00    Types: Cigarettes   Smokeless tobacco: Current    Types: Chew   Tobacco comments:    Trying to quit, has nicotine patches at home which have helped him quit before  Vaping Use   Vaping Use: Never used  Substance and Sexual Activity   Alcohol use: Not Currently   Drug use: Yes    Frequency: 2.0 times per week    Types: Marijuana    Comment: 11/19/21 occ marijuana   Sexual activity: Not on file  Other Topics Concern   Not on file  Social History Narrative   Not on file   Social Determinants of Health    Financial Resource Strain: Not on file  Food Insecurity: Not on file  Transportation Needs: Not on file  Physical Activity: Not on file  Stress: Not on file  Social Connections: Not on file  Intimate Partner Violence: Not on file    Review of Systems: ROS negative except for what is noted on the assessment and plan.  Vitals:   04/16/22 1614 04/16/22 1631  BP: (!) 140/96 (!) 136/101  Pulse: 73 95  Temp: 98.2 F (36.8 C)   TempSrc: Oral   SpO2: 96%   Weight: 205 lb 9.6 oz (93.3 kg)   Height: '5\' 7"'$  (1.702 m)     Physical Exam: Constitutional: no acute distress Cardiovascular: regular rate and rhythm, no m/r/g Pulmonary/Chest: normal work of breathing on room air, lungs clear to auscultation bilaterally MSK: normal bulk and tone Neurological: alert & oriented x 3, EOMI although has difficulty with eye movements in horizontal plane (delayed EOM), rest of CN intact. 5/5 strength bilateral upper and lower extremities. Romberg sign positive while eyes are closed.  Skin: warm and dry Psych: normal mood and behavior  Assessment & Plan:     Patient discussed with Dr. Dareen Piano  Dizziness The patient presents to the [facility] for a 2 week follow up of his dizziness. In July, the patient was seen and noted that he was dizzy when standing up too quickly and he described feeling as if the room were spinning. He had associated nausea, vomiting, lightheadedness, and a headache, without any blurry vision, hearing loss, or speech changes. Of note, he does have a hx of a CVA in 2021. The patient was sent to the ED from the [facility] at that time to rule out CVA/TIA, as he was not able move his eyes towards the left and he was having dizziness. He left from the ED without being seen by a provider due to a long wait, however, his symptoms improved. The patient works outdoors and notes that the temperature was extremely hot last week, which may have contributed to his symptoms.  Today the patient states  that his dizziness is improved, although he does occasionally have some dizziness when he moves from sitting to standing rather quickly.  He has been making an effort to stay hydrated at work and has not had any dizzy spells at  work and also denies any syncopal episodes or falls.  He denies any blurry vision, hearing loss, tinnitus, speech changes, nausea, vomiting, or headache.  On exam patient's Romberg test is positive (same as it was upon last [facility] visit).  His cranial nerves are intact, although he does have delayed extraocular muscle movement in the horizontal plane.  He is able to move his eyes to the left and right, but the movement is not fluid/and is delayed.  His strength is 5 out of 5 in his bilateral upper and lower extremities.  He has normal finger-to-nose testing bilaterally.  Plan: - Will obtain MRI brain to rule out CVA from 2 weeks ago (unneccessary sent to the ED as his symptoms are improved and this occurred 2 weeks ago) - if patient had a CVA, will refer to neuro and will get him on goal directed therapy  - Advised patient to stay hydrated and increase fluid intake, especially while at work   [employer], D.O. [facility], PGY-2 Phone: [phone] Date 04/16/2022 Time 5:07 PM

## 2022-04-17 NOTE — Progress Notes (Signed)
Internal Medicine Clinic Attending ° °Case discussed with Dr. Atway  At the time of the visit.  We reviewed the resident’s history and exam and pertinent patient test results.  I agree with the assessment, diagnosis, and plan of care documented in the resident’s note.  °

## 2022-04-28 ENCOUNTER — Other Ambulatory Visit: Payer: Self-pay | Admitting: Internal Medicine

## 2022-04-28 DIAGNOSIS — F1110 Opioid abuse, uncomplicated: Secondary | ICD-10-CM

## 2022-04-28 NOTE — Telephone Encounter (Signed)
Last visit 04/16/2022.  Last [employer] 02/17/2022.

## 2022-05-14 ENCOUNTER — Telehealth: Payer: Self-pay | Admitting: Podiatry

## 2022-05-14 NOTE — Telephone Encounter (Signed)
Left message for pt to call with his updated insurance as the Friday insurance is no longer a valid insurance. If not he will be considered self pay and will need to pay 200.00 up front.

## 2022-05-20 ENCOUNTER — Ambulatory Visit: Payer: 59 | Admitting: Podiatry

## 2022-05-22 ENCOUNTER — Ambulatory Visit: Payer: 59 | Admitting: Podiatry

## 2022-05-22 ENCOUNTER — Ambulatory Visit: Payer: 59 | Admitting: Podiatrist

## 2022-05-26 ENCOUNTER — Ambulatory Visit (INDEPENDENT_AMBULATORY_CARE_PROVIDER_SITE_OTHER): Payer: 59

## 2022-05-26 ENCOUNTER — Other Ambulatory Visit: Payer: Self-pay

## 2022-05-26 ENCOUNTER — Encounter: Payer: Self-pay | Admitting: Podiatry

## 2022-05-26 ENCOUNTER — Other Ambulatory Visit: Payer: Self-pay | Admitting: Podiatry

## 2022-05-26 ENCOUNTER — Other Ambulatory Visit: Payer: Self-pay | Admitting: *Deleted

## 2022-05-26 ENCOUNTER — Ambulatory Visit: Payer: 59 | Admitting: Podiatry

## 2022-05-26 VITALS — BP 162/121 | HR 60 | Temp 97.7°F | Ht 67.0 in | Wt 208.8 lb

## 2022-05-26 DIAGNOSIS — S86312D Strain of muscle(s) and tendon(s) of peroneal muscle group at lower leg level, left leg, subsequent encounter: Secondary | ICD-10-CM

## 2022-05-26 DIAGNOSIS — F1110 Opioid abuse, uncomplicated: Secondary | ICD-10-CM | POA: Diagnosis not present

## 2022-05-26 DIAGNOSIS — M76822 Posterior tibial tendinitis, left leg: Secondary | ICD-10-CM | POA: Diagnosis not present

## 2022-05-26 DIAGNOSIS — R69 Illness, unspecified: Secondary | ICD-10-CM | POA: Diagnosis not present

## 2022-05-26 DIAGNOSIS — M25562 Pain in left knee: Secondary | ICD-10-CM

## 2022-05-26 DIAGNOSIS — I10 Essential (primary) hypertension: Secondary | ICD-10-CM | POA: Diagnosis not present

## 2022-05-26 DIAGNOSIS — S9032XA Contusion of left foot, initial encounter: Secondary | ICD-10-CM

## 2022-05-26 DIAGNOSIS — G8929 Other chronic pain: Secondary | ICD-10-CM | POA: Diagnosis not present

## 2022-05-26 DIAGNOSIS — F17210 Nicotine dependence, cigarettes, uncomplicated: Secondary | ICD-10-CM | POA: Diagnosis not present

## 2022-05-26 MED ORDER — TRAMADOL HCL 50 MG PO TABS: 50.0000 mg | ORAL_TABLET | Freq: Three times a day (TID) | ORAL | 0 refills | Status: AC | PRN

## 2022-05-26 MED ORDER — METHYLPREDNISOLONE 4 MG PO TBPK: ORAL_TABLET | ORAL | 0 refills | Status: DC

## 2022-05-26 MED ORDER — TRIAMCINOLONE ACETONIDE 40 MG/ML IJ SUSP
20.0000 mg | Freq: Once | INTRAMUSCULAR | Status: AC
  Administered 2022-05-26: 20 mg

## 2022-05-26 MED ORDER — BUPRENORPHINE HCL-NALOXONE HCL 8-2 MG SL FILM: 1.0000 | ORAL_FILM | Freq: Two times a day (BID) | SUBLINGUAL | 0 refills | Status: DC

## 2022-05-26 MED ORDER — SPIRONOLACTONE 50 MG PO TABS: 50.0000 mg | ORAL_TABLET | Freq: Every day | ORAL | 11 refills | Status: DC

## 2022-05-26 MED ORDER — TRAMADOL HCL 50 MG PO TABS: 50.0000 mg | ORAL_TABLET | Freq: Three times a day (TID) | ORAL | 0 refills | Status: DC | PRN

## 2022-05-26 NOTE — Assessment & Plan Note (Signed)
Patient presents today for follow-up of his OUD.  He is on Suboxone 8-2 mg film.  Denies relapse or cravings.  Denies constipation.  Wears dentures.  Continues to endorse Xanax for sleep which she gets from her relative.  He endorses many years of Xanax at night for sleep and states that it was originally prescribed 3 times a day for blood pressure control.  -Continue Suboxone -Will discuss options for help with sleep at next visit.  Would be helpful to discuss sleep hygiene and likely refer for sleep study as well.

## 2022-05-26 NOTE — Assessment & Plan Note (Signed)
Patient presents with left knee pain that has been going on for years but worsened more recently after patient stepped in a hole.  Voltaren offers relief.  Upon chart review it seems that there might have been a medial meniscus injury and this may have turned into a chronic issue for him as exam today demonstrates tenderness to palpation at medial joint line.  Pain with varus and valgus stress.  Patient is still able to ambulate.  Referral to sports medicine 2 years ago was not completed as he did not show for appointment.  -Referral to sports medicine in [place]

## 2022-05-26 NOTE — Progress Notes (Signed)
He presents today with an injury September 13 where he twisted his medial foot and ankle left.  He states he stepped in a hole last week and felt a pop he is tried arch supports he said it helped some he states that it swells a lot by the end of the day he has tried ice and rest.  Objective: Vital signs stable he is alert oriented x3 pulses are palpable.  He has pain on palpation of the posterior tibial tendon with mild swelling along the medial aspect of the foot from the medial malleolus to the navicular tuberosity.  He has tenderness on palpation of the posterior tibial tendon and on inversion against resistance.  There appears to be some fluctuance within the tendon sheath.  Radiographs taken today demonstrate an osseously mature foot no significant osseous abnormalities in this area.  Some soft tissue swelling the anterior ankle in the medial foot.  Assessment posterior tibial tendinitis.  Plan: Placed him back in his cam boot which she already has at home.  I injected the area subcutaneously today with 10 mg Kenalog 5 mg Marcaine making sure not to inject into the tendon itself.  Started him on methylprednisolone and we will give him 1 tramadol at nighttime.

## 2022-05-26 NOTE — Telephone Encounter (Signed)
Patient called in stating he has not received call to schedule brain MRI. States dizzy spells have stopped but feels he should still get this done. Referral coordinator has attempted to contact patient regarding lapse in insurance but he has not returned her calls. Patient transferred to Referral Coordinator.  Also, requesting refill on suboxone. He is scheduling first available appt for today with PCP.

## 2022-05-26 NOTE — Assessment & Plan Note (Addendum)
Patient's blood pressure elevated today at 167/112.  Currently taking clonidine 0.1 mg twice daily, HCTZ 25 mg daily, and spironolactone 25 mg daily.  Denies headaches or chest pains. Cr stable at last check. K+ low and should tolerate increased aldactone.  -Instructed to decrease clonidine to once daily and will likely discontinue at follow-up in 2 weeks -Increase spironolactone to 50 mg daily -Continue HCTZ 25 mg daily -Return in 2 weeks for blood pressure recheck

## 2022-05-26 NOTE — Patient Instructions (Addendum)
[name], it was a pleasure seeing you today!  Today we discussed: Blood pressure: Stop taking clonidine twice a day and only take once until we see you back in 2 weeks. Start taking spironolactone 50 mg daily. Continue taking HCTZ once a day. We will see you back in 2 weeks. Left knee pain: you should receive a call from sports medicine to schedule an appointment.  I have ordered the following labs today:  Lab Orders  No laboratory test(s) ordered today     Tests ordered today:  none  Referrals ordered today:   Referral Orders         Ambulatory referral to Sports Medicine       I have ordered the following medication/changed the following medications:   Stop the following medications: Medications Discontinued During This Encounter  Medication Reason   spironolactone (ALDACTONE) 25 MG tablet Dose change     Start the following medications: Meds ordered this encounter  Medications   spironolactone (ALDACTONE) 50 MG tablet    Sig: Take 1 tablet (50 mg total) by mouth daily.    Dispense:  30 tablet    Refill:  11     Follow-up:  2 weeks    Please make sure to arrive 15 minutes prior to your next appointment. If you arrive late, you may be asked to reschedule.   We look forward to seeing you next time. Please call our clinic at [phone] if you have any questions or concerns. The best time to call is Monday-Friday from 9am-4pm, but there is someone available 24/7. If after hours or the weekend, call the main hospital number and ask for the Internal Medicine Resident On-Call. If you need medication refills, please notify your pharmacy one week in advance and they will send [place] a request.  Thank you for letting us take part in your care. Wishing you the best!  Thank you, Linward Natal, MD

## 2022-05-26 NOTE — Progress Notes (Signed)
CC: OUD follow-up  HPI:  [name] is a 56 y.o. with past medical history as below who presents for follow-up of his OUD.  Past Medical History:  Diagnosis Date   Acute pancreatitis    Anxiety    Arthritis    Colonic adenoma    COPD (chronic obstructive pulmonary disease) (HCC)    Depression    Diverticulitis of colon with perforation 08/2010   GERD (gastroesophageal reflux disease)    History of hiatal hernia    Hypertension    Polysubstance abuse ([place])    Renal insufficiency    Sleep apnea    Intolerant of CPAP   Stroke ([place])    TIA (transient ischemic attack)    Review of Systems: See detailed assessment and plan for pertinent ROS.  Physical Exam:  Vitals:   05/26/22 1100 05/26/22 1132  BP: (!) 167/112 (!) 162/121  Pulse: 60 60  Temp: 97.7 F (36.5 C)   TempSrc: Oral   SpO2: 98%   Weight: 208 lb 12.8 oz (94.7 kg)   Height: '5\' 7"'$  (1.702 m)    Physical Exam Constitutional:      General: He is not in acute distress. HENT:     Head: Normocephalic and atraumatic.  Eyes:     Extraocular Movements: Extraocular movements intact.  Cardiovascular:     Rate and Rhythm: Normal rate and regular rhythm.  Pulmonary:     Effort: Pulmonary effort is normal.     Breath sounds: Normal breath sounds. No wheezing, rhonchi or rales.  Musculoskeletal:     Comments: Left knee without swelling or erythema. Full active and passive ROM. Pain with varus stress. Negative anterior/posterior drawer. Tenderness to palpation at medial joint line.  Skin:    General: Skin is warm and dry.  Neurological:     Mental Status: He is alert and oriented to person, place, and time.  Psychiatric:        Mood and Affect: Mood normal.      Assessment & Plan:   See Encounters Tab for problem based charting.  HTN (hypertension) Patient's blood pressure elevated today at 167/112.  Currently taking clonidine 0.1 mg twice daily, HCTZ 25 mg daily, and spironolactone 25 mg daily.   Denies headaches or chest pains.  -Instructed to decrease clonidine to once daily and will likely discontinue at follow-up in 2 weeks -Increase spironolactone to 50 mg daily -Continue HCTZ 25 mg daily -Return in 2 weeks for blood pressure recheck  Opioid use disorder, mild, abuse ([place]) Patient presents today for follow-up of his OUD.  He is on Suboxone 8-2 mg film.  Denies relapse or cravings.  Denies constipation.  Wears dentures.  Continues to endorse Xanax for sleep which she gets from her relative.  He endorses many years of Xanax at night for sleep and states that it was originally prescribed 3 times a day for blood pressure control.  -Continue Suboxone -Will discuss options for help with sleep at next visit.  Would be helpful to discuss sleep hygiene and likely refer for sleep study as well.  Left knee pain Patient presents with left knee pain that has been going on for years but worsened more recently after patient stepped in a hole.  Voltaren offers relief.  Upon chart review it seems that there might have been a medial meniscus injury and this may have turned into a chronic issue for him as exam today demonstrates tenderness to palpation at medial joint line.  Pain with  varus and valgus stress.  Patient is still able to ambulate.  Referral to sports medicine 2 years ago was not completed as he did not show for appointment.  -Referral to sports medicine in [place]    Patient seen with Dr. Daryll Drown

## 2022-05-27 ENCOUNTER — Telehealth: Payer: Self-pay

## 2022-05-27 ENCOUNTER — Ambulatory Visit: Payer: 59 | Admitting: Gastroenterology

## 2022-05-27 NOTE — Progress Notes (Deleted)
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   Need to exclude mechanical distal esophageal peptic stricture, extrinsic compression, or possible ADR to meds that could inrease LES pressure (opiates, anticholinesterases) prior to considering achalasia.    history of dysphagia and hemorrhoids. Patient has significant history of colectomy with end colostomy (later reversed) anal fissure, acute pancreatitis, chronic abdominal pain, diverticulitis with perforation, GERD, Hiatal hernia, HTN, sleep apnea, and polysubstance abuse. Surveillance colonoscopy due in 2024.   Due to persistent dysphagia despite EGD in 2021, he had BPE July 2022 with moderate dysmotility and possible stricture. He had trace transient penetration as well. Sluggish primary peristaltic wave. Contrast statis throughout esophagus.    Underwent early interval EGD Sept 2022: inlet patch with superimposed nodule s/p biopsy. Normal esophagus s/p dilation. Normal gastric mucosa. Normal duodenum. Reflux changes on path.

## 2022-05-28 ENCOUNTER — Encounter: Payer: Self-pay | Admitting: Internal Medicine

## 2022-05-28 ENCOUNTER — Ambulatory Visit (INDEPENDENT_AMBULATORY_CARE_PROVIDER_SITE_OTHER): Payer: 59 | Admitting: Internal Medicine

## 2022-05-28 VITALS — BP 139/96 | HR 71 | Temp 97.5°F | Ht 67.0 in | Wt 203.6 lb

## 2022-05-28 DIAGNOSIS — K222 Esophageal obstruction: Secondary | ICD-10-CM

## 2022-05-28 DIAGNOSIS — R1319 Other dysphagia: Secondary | ICD-10-CM

## 2022-05-28 DIAGNOSIS — K219 Gastro-esophageal reflux disease without esophagitis: Secondary | ICD-10-CM | POA: Diagnosis not present

## 2022-05-28 DIAGNOSIS — K649 Unspecified hemorrhoids: Secondary | ICD-10-CM | POA: Diagnosis not present

## 2022-05-28 MED ORDER — HYDROCORTISONE (PERIANAL) 2.5 % EX CREA
1.0000 | TOPICAL_CREAM | Freq: Two times a day (BID) | CUTANEOUS | 2 refills | Status: DC | PRN
Start: 2022-05-28 — End: 2023-08-12

## 2022-05-28 MED ORDER — PANTOPRAZOLE SODIUM 40 MG PO TBEC: 40.0000 mg | DELAYED_RELEASE_TABLET | Freq: Two times a day (BID) | ORAL | 3 refills | Status: DC

## 2022-05-28 NOTE — Patient Instructions (Signed)
I will reach out to Dr. Gala Romney in regards to possible repeat EGD with dilation.  We will let you know after I have spoken with him  Continue on pantoprazole twice daily.  I refilled this today.  Continue rectal cream as needed for hemorrhoids.  I also refilled this.  It was nice meeting you today.  Dr. Abbey Chatters

## 2022-05-28 NOTE — Telephone Encounter (Signed)
No further information is needed and has been routed to [place] to address

## 2022-05-29 ENCOUNTER — Ambulatory Visit: Payer: 59 | Admitting: Podiatry

## 2022-05-29 ENCOUNTER — Telehealth: Payer: Self-pay | Admitting: Internal Medicine

## 2022-05-29 NOTE — Progress Notes (Signed)
Referring Provider: Linward Natal, MD Primary Care Physician:  Linward Natal, MD Primary GI:  Dr. Abbey Chatters  Chief Complaint  Patient presents with   Gastroesophageal Reflux    Follow up on GERD, dysphagia, abdominal cramping. Still having some trouble swallowing and has some abdominal cramping about 2 -3 days a week.     HPI:   [name] is a 56 y.o. male who presents to clinic today for follow-up visit.  Patient has significant history of colectomy with end colostomy (later reversed) anal fissure, acute pancreatitis, chronic abdominal pain, diverticulitis with perforation, GERD, Hiatal hernia, HTN, sleep apnea, and polysubstance abuse. Surveillance colonoscopy due in 2024.   Main complaint for me today is ongoing esophageal dysphagia.  Feels as though solids get stuck in his substernal region.  No melena hematochezia.  No chest pain, some epigastric discomfort.  EGD 05/23/2021 without obvious esophageal stricture, empiric dilation performed.  Mild improvement in symptoms.  Due to ongoing dysphagia, underwent esophageal manometry for 523 which showed normal peristalsis, concern for EGJ outflow obstruction.  Patient had CT of his chest for lung cancer screening 01/21/2022 which was largely unremarkable.  Does have chronic GERD which is well controlled on pantoprazole 40 mg twice daily.  History of hemorrhoid disease which is well controlled on Anusol cream as needed.  Requesting refills.  Past Medical History:  Diagnosis Date   Acute pancreatitis    Anxiety    Arthritis    Colonic adenoma    COPD (chronic obstructive pulmonary disease) (HCC)    Depression    Diverticulitis of colon with perforation 08/2010   GERD (gastroesophageal reflux disease)    History of hiatal hernia    Hypertension    Polysubstance abuse ([place])    Renal insufficiency    Sleep apnea    Intolerant of CPAP   Stroke ([place])    TIA (transient ischemic attack)     Past Surgical History:   Procedure Laterality Date   APPENDECTOMY     BIOPSY  12/14/2017   Procedure: BIOPSY;  Surgeon: Daneil Dolin, MD;  [facility];  Service: Endoscopy;;  gastric    BIOPSY  10/13/2019   Procedure: BIOPSY;  Surgeon: Daneil Dolin, MD;  [facility];  Service: Endoscopy;;  esophagus   BIOPSY  05/20/2021   Procedure: BIOPSY;  Surgeon: Daneil Dolin, MD;  [facility];  Service: Endoscopy;;   clamp left in colon after abd surgery     COLON SURGERY  05/2011   [facility]. exp laparotomy with colostomy takedown, lysis of adhesions (3hours), loop ileostomy. complicated by abscess formation requiring percutaneous drainage   colonoscopy via stoma with endoscopy of [name]  04/2011   [facility]. two sessile polyps in trv colon. complete resection but only partial retrieval. congested mucosa in area 3cm proximal to stoma, erythematous mucosa in Hartmann pouch. repeat tcs 8/2017path-adenomatous polyp   COLONOSCOPY WITH PROPOFOL N/A 12/14/2017   Dr. Gala Romney: 2 4 to 6 mm polyps in the cecum, tubular adenomas, single rectal polyp tubular adenoma, nonbleeding internal hemorrhoids.  Appears to be a surgical clip at the appendiceal orifice.  Next colonoscopy in 5 years   ESOPHAGEAL MANOMETRY N/A 12/11/2021   Procedure: ESOPHAGEAL MANOMETRY (EM);  Surgeon: Mauri Pole, MD;  [facility];  Service: Endoscopy;  Laterality: N/A;   ESOPHAGOGASTRODUODENOSCOPY  03/05/2012   erosive reflux esophagitis, bulbar erosions, gastric erosions/petichae, bx showed gastritis, no H.Pylori. Procedure: ESOPHAGOGASTRODUODENOSCOPY (EGD);  Surgeon: [name]  Rourk, MD;  [facility];  Service: Endoscopy;  Laterality: N/A;  11:30   ESOPHAGOGASTRODUODENOSCOPY (EGD) WITH PROPOFOL N/A 12/14/2017   Dr. Gala Romney: Erosive reflux esophagitis with questionable mild stricturing/non-obstruction at this level status post dilation.  multiple gastric erosions in the stomach biopsy showed  reactive gastropathy but no H. pylori.   ESOPHAGOGASTRODUODENOSCOPY (EGD) WITH PROPOFOL N/A 10/13/2019   negative Barrett's, normal stomach   ESOPHAGOGASTRODUODENOSCOPY (EGD) WITH PROPOFOL N/A 05/20/2021   inlet patch with superimposed nodule s/p biopsy. Normal esophagus s/p dilation. Normal gastric mucosa. Normal duodenum. Reflux changes on path.   HERNIA REPAIR     KNEE SURGERY Right 2006   MALONEY DILATION N/A 12/14/2017   Procedure: Venia Minks DILATION;  Surgeon: Daneil Dolin, MD;  [facility];  Service: Endoscopy;  Laterality: N/A;   MALONEY DILATION  05/20/2021   Procedure: Venia Minks DILATION;  Surgeon: Daneil Dolin, MD;  [facility];  Service: Endoscopy;;   MULTIPLE EXTRACTIONS WITH ALVEOLOPLASTY  02/23/2012   Procedure: MULTIPLE EXTRACION WITH ALVEOLOPLASTY;  Surgeon: Gae Bon, DDS;  Location: [facility];  Service: Oral Surgery;  Laterality: Bilateral;  Removal of left mandibular torus, multiple extractions with alveoloplasty   POLYPECTOMY  12/14/2017   Procedure: POLYPECTOMY;  Surgeon: Daneil Dolin, MD;  [facility];  Service: Endoscopy;;   reversal of colostomy  10/2011   per patient, did not have report   right knee surgery     sigmoid colectomy with end colostomy, Hartmann's pouch  08/2010   [facility]. complicated diverticulitis   UMBILICAL HERNIA REPAIR      Current Outpatient Medications  Medication Sig Dispense Refill   albuterol (PROVENTIL HFA;VENTOLIN HFA) 108 (90 Base) MCG/ACT inhaler Inhale 2 puffs into the lungs every 6 (six) hours as needed for wheezing or shortness of breath.     Buprenorphine HCl-Naloxone HCl 8-2 MG FILM Place 1 Film under the tongue 2 (two) times daily. 60 each 0   cloNIDine (CATAPRES) 0.1 MG tablet Take 2 tablets (0.2 mg total) by mouth daily. 180 tablet 0   diclofenac Sodium (VOLTAREN) 1 % GEL APPLY 2 GRAMS  TOPICALLY 4 TIMES DAILY 200 g 0   hydrochlorothiazide (HYDRODIURIL) 25 MG tablet Take 1 tablet (25 mg  total) by mouth daily. 90 tablet 3   methylPREDNISolone (MEDROL DOSEPAK) 4 MG TBPK tablet 6 day dose pack - take as directed 21 tablet 0   sildenafil (VIAGRA) 100 MG tablet Take 1 tablet (100 mg total) by mouth as needed for erectile dysfunction. 20 tablet 1   spironolactone (ALDACTONE) 50 MG tablet Take 1 tablet (50 mg total) by mouth daily. 30 tablet 11   traMADol (ULTRAM) 50 MG tablet Take 1 tablet (50 mg total) by mouth every 8 (eight) hours as needed. 30 tablet 0   traMADol (ULTRAM) 50 MG tablet Take 1 tablet (50 mg total) by mouth every 8 (eight) hours as needed for up to 5 days. 15 tablet 0   hydrocortisone (ANUSOL-HC) 2.5 % rectal cream Place 1 Application rectally 2 (two) times daily as needed for hemorrhoids. 28 g 2   pantoprazole (PROTONIX) 40 MG tablet Take 1 tablet (40 mg total) by mouth 2 (two) times daily. 180 tablet 3   No current facility-administered medications for this visit.    Allergies as of 05/28/2022 - Review Complete 05/28/2022  Allergen Reaction Noted   Codeine Itching and Nausea And Vomiting    Latex Other (See Comments) 03/09/2012   Lisinopril  05/27/2017   Morphine Hives 09/14/2021   Adhesive [tape] Rash 03/08/2012    Family History  Problem Relation Age of Onset   Stomach cancer Father    Lung cancer Father    Throat cancer Father    Cancer Father        lung and throat cancer   Hypertension Father    Liver disease Cousin        cirrhosis, etoh   Throat cancer Paternal Uncle    Lung cancer Paternal Uncle    Colon cancer Cousin 23   Heart disease Mother    Hypertension Mother    Colon cancer Maternal Uncle    Throat cancer Paternal Uncle    Lung cancer Paternal Uncle    Throat cancer Paternal Uncle    Lung cancer Paternal Uncle     Social History   Socioeconomic History   Marital status: Single    Spouse name: Not on file   Number of children: 0   Years of education: Not on file   Highest education level: Not on file  Occupational  History   Not on file  Tobacco Use   Smoking status: Every Day    Packs/day: 1.00    Years: 37.00    Total pack years: 37.00    Types: Cigarettes    Passive exposure: Current   Smokeless tobacco: Current    Types: Chew   Tobacco comments:    Trying to quit, has nicotine patches at home which have helped him quit before  Vaping Use   Vaping Use: Never used  Substance and Sexual Activity   Alcohol use: Not Currently   Drug use: Yes    Frequency: 2.0 times per week    Types: Marijuana    Comment: 11/19/21 occ marijuana   Sexual activity: Not on file  Other Topics Concern   Not on file  Social History Narrative   Not on file   Social Determinants of Health   Financial Resource Strain: Not on file  Food Insecurity: Not on file  Transportation Needs: Not on file  Physical Activity: Not on file  Stress: Not on file  Social Connections: Not on file    Subjective: Review of Systems  Constitutional:  Negative for chills and fever.  HENT:  Negative for congestion and hearing loss.   Eyes:  Negative for blurred vision and double vision.  Respiratory:  Negative for cough and shortness of breath.   Cardiovascular:  Negative for chest pain and palpitations.  Gastrointestinal:  Positive for heartburn. Negative for abdominal pain, blood in stool, constipation, diarrhea, melena and vomiting.       Dysphagia  Genitourinary:  Negative for dysuria and urgency.  Musculoskeletal:  Negative for joint pain and myalgias.  Skin:  Negative for itching and rash.  Neurological:  Negative for dizziness and headaches.  Psychiatric/Behavioral:  Negative for depression. The patient is not nervous/anxious.      Objective: BP (!) 139/96 (BP Location: Left Arm, Patient Position: Sitting, Cuff Size: Large)   Pulse 71   Temp (!) 97.5 F (36.4 C) (Oral)   Ht '5\' 7"'$  (1.702 m)   Wt 203 lb 9.6 oz (92.4 kg)   BMI 31.89 kg/m  Physical Exam Constitutional:      Appearance: Normal appearance.  HENT:      Head: Normocephalic and atraumatic.  Eyes:     Extraocular Movements: Extraocular movements intact.     Conjunctiva/sclera: Conjunctivae normal.  Cardiovascular:     Rate  and Rhythm: Normal rate and regular rhythm.  Pulmonary:     Effort: Pulmonary effort is normal.     Breath sounds: Normal breath sounds.  Abdominal:     General: Bowel sounds are normal.     Palpations: Abdomen is soft.  Musculoskeletal:        General: Normal range of motion.     Cervical back: Normal range of motion and neck supple.  Skin:    General: Skin is warm.  Neurological:     General: No focal deficit present.     Mental Status: He is alert and oriented to person, place, and time.  Psychiatric:        Mood and Affect: Mood normal.        Behavior: Behavior normal.      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  Recommended repeat EGD with dilation.  I then further discussed with Dr. Gala Romney who is his primary gastroenterologist who is in agreement.  We will set up for EGD with dilation, ASA 3. The risks including infection, bleed, or perforation as well as benefits, limitations, alternatives and imponderables have been reviewed with the patient. Potential for esophageal dilation, biopsy, etc. have also been reviewed.  Questions have been answered. All parties agreeable.  Continue on pantoprazole twice daily.  GERD well-controlled.  Hemorrhoid disease well-controlled on Anusol.  Refill sent today.  05/29/2022 2:41 PM   Disclaimer: This note was dictated with voice recognition software. Similar sounding words can inadvertently be transcribed and may not be corrected upon review.

## 2022-05-29 NOTE — Telephone Encounter (Signed)
Noted will schedule once we receive future schedule

## 2022-05-29 NOTE — Telephone Encounter (Signed)
I discussed case further with Dr. Gala Romney he is in agreement for patient to undergo repeat EGD with dilation.  Can we set this up with Dr. Gala Romney, ASA 3?  Thank you

## 2022-05-30 ENCOUNTER — Telehealth: Payer: Self-pay | Admitting: *Deleted

## 2022-05-30 NOTE — Telephone Encounter (Signed)
Patient notified

## 2022-05-30 NOTE — Telephone Encounter (Signed)
Spoke with patient giving information per pharmacy and physician concerning refill of he pain medicine, verbalized understanding and said that was ok, foot is feeling better from injection and will f/u at next appointment.

## 2022-06-03 NOTE — Progress Notes (Signed)
Internal Medicine Clinic Attending  Case discussed with Dr. White  at the time of the visit.  We reviewed the resident's history and exam and pertinent patient test results.  I agree with the assessment, diagnosis, and plan of care documented in the resident's note.  

## 2022-06-10 ENCOUNTER — Encounter: Payer: Self-pay | Admitting: Radiology

## 2022-06-11 ENCOUNTER — Encounter: Payer: Self-pay | Admitting: *Deleted

## 2022-06-11 NOTE — Telephone Encounter (Signed)
Spoke with pt. Scheduled for 11/1 at 1pm with Dr. Gala Romney. Aware will mail instructions/pre-op appt.

## 2022-06-17 ENCOUNTER — Encounter: Payer: 59 | Admitting: Internal Medicine

## 2022-06-22 DIAGNOSIS — S46912A Strain of unspecified muscle, fascia and tendon at shoulder and upper arm level, left arm, initial encounter: Secondary | ICD-10-CM | POA: Diagnosis not present

## 2022-06-22 DIAGNOSIS — M25512 Pain in left shoulder: Secondary | ICD-10-CM | POA: Diagnosis not present

## 2022-06-22 DIAGNOSIS — S40012A Contusion of left shoulder, initial encounter: Secondary | ICD-10-CM | POA: Diagnosis not present

## 2022-06-24 ENCOUNTER — Ambulatory Visit: Payer: 59 | Admitting: Podiatry

## 2022-06-24 ENCOUNTER — Encounter: Payer: Self-pay | Admitting: Podiatry

## 2022-06-24 DIAGNOSIS — M76822 Posterior tibial tendinitis, left leg: Secondary | ICD-10-CM

## 2022-06-24 NOTE — Progress Notes (Signed)
[name] presents today for follow-up of posterior tibial tendinitis left.  States that is doing much better after the injection.  Objective: Vital signs stable he is alert oriented x3 posterior tibial tendinitis is much decreased on palpation not fluctuant within the tendon sheath any longer.  Assessment: Nearly 100% resolved posterior tibial tendinitis left.  Plan: Follow-up with me on an as-needed basis.

## 2022-06-25 ENCOUNTER — Encounter: Payer: 59 | Admitting: Internal Medicine

## 2022-06-26 ENCOUNTER — Ambulatory Visit (INDEPENDENT_AMBULATORY_CARE_PROVIDER_SITE_OTHER): Payer: 59 | Admitting: Student

## 2022-06-26 ENCOUNTER — Ambulatory Visit (HOSPITAL_COMMUNITY)
Admission: RE | Admit: 2022-06-26 | Discharge: 2022-06-26 | Disposition: A | Discharge status: Home / Self Care | Payer: 59 | Source: Ambulatory Visit | Attending: Student in an Organized Health Care Education/Training Program | Admitting: Student in an Organized Health Care Education/Training Program

## 2022-06-26 VITALS — BP 137/80 | HR 66 | Temp 97.7°F | Ht 67.0 in | Wt 203.5 lb

## 2022-06-26 DIAGNOSIS — R69 Illness, unspecified: Secondary | ICD-10-CM | POA: Diagnosis not present

## 2022-06-26 DIAGNOSIS — F1110 Opioid abuse, uncomplicated: Secondary | ICD-10-CM

## 2022-06-26 DIAGNOSIS — M25512 Pain in left shoulder: Secondary | ICD-10-CM | POA: Insufficient documentation

## 2022-06-26 DIAGNOSIS — I10 Essential (primary) hypertension: Secondary | ICD-10-CM

## 2022-06-26 DIAGNOSIS — F17210 Nicotine dependence, cigarettes, uncomplicated: Secondary | ICD-10-CM

## 2022-06-26 DIAGNOSIS — G8929 Other chronic pain: Secondary | ICD-10-CM

## 2022-06-26 DIAGNOSIS — M19012 Primary osteoarthritis, left shoulder: Secondary | ICD-10-CM | POA: Diagnosis not present

## 2022-06-26 MED ORDER — DULOXETINE HCL 30 MG PO CPEP: 30.0000 mg | ORAL_CAPSULE | Freq: Every day | ORAL | 2 refills | Status: DC

## 2022-06-26 NOTE — Assessment & Plan Note (Addendum)
Patient presents with 4 weeks of left shoulder pain and decreased range of motion secondary to pain.  He denies any inciting event but notes with a tilted fall of a ladder and hit him on the left shoulder during this 4 weeks.  On exam abduction is limited to 90 degrees by pain.  Pain is also elicited with passive movement, palpation of the glenohumeral joint, AC joint,  joint, and rotator cuff muscle bellies most predominantly supraspinatus.  Positive empty can test on the left.  Positive Apley scratch test on the left.  Positive crossarm test on the left.  There is no joint effusion or signs of acute infection in the shoulder. - Sent for shoulder x-ray - Start duloxetine 30 mg daily

## 2022-06-26 NOTE — Assessment & Plan Note (Addendum)
Patient is doing well on Suboxone 8-2 mg film twice daily.  Denies relapse or cravings.  Denies constipation.  He continues to use Xanax at night for sleep which she gets from a friend.  He does note increased overall pain from chronic osteoarthritis. - Continue Suboxone 8-2 mg films twice daily - Start duloxetine 30 mg daily

## 2022-06-26 NOTE — Progress Notes (Signed)
   CC: Follow-up for hypertension  HPI:  [name] is a 56 y.o. male with history as below who presents for follow-up.  Please see encounters tab for problem-based charting.  Past Medical History:  Diagnosis Date   Acute pancreatitis    Anxiety    Arthritis    Colonic adenoma    COPD (chronic obstructive pulmonary disease) (HCC)    Depression    Diverticulitis of colon with perforation 08/2010   GERD (gastroesophageal reflux disease)    History of hiatal hernia    Hypertension    Polysubstance abuse ([place])    Renal insufficiency    Sleep apnea    Intolerant of CPAP   Stroke ([place])    TIA (transient ischemic attack)    Review of Systems:   A comprehensive review of systems was negative except for: Musculoskeletal: positive for arthralgias and myalgias   Physical Exam:  Vitals:   06/26/22 1403  BP: 137/80  Pulse: 66  Temp: 97.7 F (36.5 C)  TempSrc: Oral  SpO2: 97%  Weight: 203 lb 8 oz (92.3 kg)  Height: '5\' 7"'$  (1.702 m)   Constitutional: Obese male sitting in his chair, in no acute distress. HENT: Normocephalic, atraumatic,  Eyes: Sclera non-icteric, PERRL, EOM intact Cardio:Regular rate and rhythm. No murmurs, rubs, or gallops. 2+ bilateral radial pulses. Pulm: Coarse breath sounds bilaterally. Normal work of breathing on room air. Abdomen: Soft, non-tender, non-distended, positive bowel sounds. MSK: Abduction of the left shoulder is limited to 90 degrees but pain.  Pain over the left shoulder with palpation of the glenohumeral joint, AC joint, [place] joint.  Positive testing left shoulder include empty can,, Apley scratch test, crossarm.  No evidence of joint effusion or infection of the left shoulder. Skin:Warm and dry. Neuro:Alert and oriented x3. No focal deficit noted. Psych:Pleasant mood and affect.   Assessment & Plan:   See Encounters Tab for problem based charting.  Patient seen with Dr. Evette Doffing

## 2022-06-26 NOTE — Assessment & Plan Note (Signed)
Patient's blood pressure is at goal today at 137/80.  He did decrease his clonidine to 0.1 mg daily from twice daily over 2 weeks ago.  He denies any side effects on the increased dose of spironolactone. - Discontinue clonidine - Continue spironolactone 50 mg daily and hydrochlorothiazide 25 mg daily

## 2022-06-26 NOTE — Patient Instructions (Addendum)
  Thank you, [name], for allowing [place] to provide your care today. Today we discussed . . .  > Left shoulder pain       -We will send you for an x-ray of your left shoulder today and start you on duloxetine 30 mg daily to help with this pain as well as your other chronic pains. > Opiate use disorder       -We will refill your Suboxone today and continue on twice daily films. > Hypertension       -We will discontinue clonidine today and he will continue to take spironolactone 50 mg daily and hydrochlorothiazide 25 mg daily.   I have ordered the following labs for you:  Lab Orders         ToxAssure Select,+Antidepr,UR        Tests ordered today:  Shoulder x-ray   Referrals ordered today:   Referral Orders  No referral(s) requested today      I have ordered the following medication/changed the following medications:   Stop the following medications: Medications Discontinued During This Encounter  Medication Reason   cloNIDine (CATAPRES) 0.1 MG tablet      Start the following medications: Meds ordered this encounter  Medications   DULoxetine (CYMBALTA) 30 MG capsule    Sig: Take 1 capsule (30 mg total) by mouth daily.    Dispense:  30 capsule    Refill:  2      Follow up: 2 months    Remember:     Should you have any questions or concerns please call the internal medicine clinic at [phone].     [name], [name]

## 2022-06-27 NOTE — Addendum Note (Signed)
Addended by: Lalla Brothers T on: 06/27/2022 08:51 AM   Modules accepted: Orders

## 2022-06-27 NOTE — Progress Notes (Signed)
Internal Medicine Clinic Attending  I saw and evaluated the patient.  I personally confirmed the key portions of the history and exam documented by Dr. Goodwin and I reviewed pertinent patient test results.  The assessment, diagnosis, and plan were formulated together and I agree with the documentation in the resident's note.  

## 2022-06-30 NOTE — Progress Notes (Signed)
Called and discussed the results of the left shoulder x-ray with the patient.  He is continuing to complain of stable pain and inability to work with his left arm.  He has been continuing conservative management and has taken duloxetine for the past 3 to 4 days.  He is interested in a steroid injection after one of his friends noted that it helped their shoulder a lot.  We will set him up for a shoulder injection in the clinic.  All questions were answered

## 2022-07-01 ENCOUNTER — Other Ambulatory Visit: Payer: Self-pay | Admitting: Internal Medicine

## 2022-07-01 DIAGNOSIS — F1110 Opioid abuse, uncomplicated: Secondary | ICD-10-CM

## 2022-07-01 NOTE — Telephone Encounter (Signed)
LOV 06/26/22. With Dr Nikki Dom.

## 2022-07-03 ENCOUNTER — Telehealth: Payer: Self-pay | Admitting: *Deleted

## 2022-07-03 ENCOUNTER — Encounter: Payer: Self-pay | Admitting: *Deleted

## 2022-07-03 NOTE — Telephone Encounter (Signed)
Called pt, LMOVM to call back. 

## 2022-07-03 NOTE — Patient Instructions (Signed)
[name]  07/03/2022     '@PREFPERIOPPHARMACY'$ @   Your procedure is scheduled on  07/09/2022.   Report to [facility] at  1100  A.M.   Call this number if you have problems the morning of surgery:  [phone]  If you experience any cold or flu symptoms such as cough, fever, chills, shortness of breath, etc. between now and your scheduled surgery, please notify us at the above number.   Remember:  Follow the diet instructions given to you by the office.      Use your inhaler before you come and bring your rescue inhaler with you.     Take these medicines the morning of surgery with A SIP OF WATER                                  naloxone, cymbalta, protonix.     Do not wear jewelry, make-up or nail polish.  Do not wear lotions, powders, or perfumes, or deodorant.  Do not shave 48 hours prior to surgery.  Men may shave face and neck.  Do not bring valuables to the hospital.  [facility] is not responsible for any belongings or valuables.  Contacts, dentures or bridgework may not be worn into surgery.  Leave your suitcase in the car.  After surgery it may be brought to your room.  For patients admitted to the hospital, discharge time will be determined by your treatment team.  Patients discharged the day of surgery will not be allowed to drive home and must have someone with them for 24 hours.    Special instructions:   DO NOT smoke tobacco or vape for 24 hours before your procedure.  Please read over the following fact sheets that you were given. Anesthesia Post-op Instructions and Care and Recovery After Surgery        Upper Endoscopy, Adult, Care After After the procedure, it is common to have a sore throat. It is also common to have: Mild stomach pain or discomfort. Bloating. Nausea. Follow these instructions at home: The instructions below may help you care for yourself at home. Your health care provider may give you more instructions. If you  have questions, ask your health care provider. If you were given a sedative during the procedure, it can affect you for several hours. Do not drive or operate machinery until your health care provider says that it is safe. If you will be going home right after the procedure, plan to have a responsible adult: Take you home from the hospital or clinic. You will not be allowed to drive. Care for you for the time you are told. Follow instructions from your health care provider about what you may eat and drink. Return to your normal activities as told by your health care provider. Ask your health care provider what activities are safe for you. Take over-the-counter and prescription medicines only as told by your health care provider. Contact a health care provider if you: Have a sore throat that lasts longer than one day. Have trouble swallowing. Have a fever. Get help right away if you: Vomit blood or your vomit looks like coffee grounds. Have bloody, black, or tarry stools. Have a very bad sore throat or you cannot swallow. Have difficulty breathing or very bad pain in your chest or abdomen. These symptoms may be an emergency. Get help right away. Call  911. Do not wait to see if the symptoms will go away. Do not drive yourself to the hospital. Summary After the procedure, it is common to have a sore throat, mild stomach discomfort, bloating, and nausea. If you were given a sedative during the procedure, it can affect you for several hours. Do not drive until your health care provider says that it is safe. Follow instructions from your health care provider about what you may eat and drink. Return to your normal activities as told by your health care provider. This information is not intended to replace advice given to you by your health care provider. Make sure you discuss any questions you have with your health care provider. Document Revised: 12/04/2021 Document Reviewed: 12/04/2021 Elsevier  Patient Education  [place]. Esophageal Dilatation Esophageal dilatation, also called esophageal dilation, is a procedure to widen or open a blocked or narrowed part of the esophagus. The esophagus is the part of the body that moves food and liquid from the mouth to the stomach. You may need this procedure if: You have a buildup of scar tissue in your esophagus that makes it difficult, painful, or impossible to swallow. This can be caused by gastroesophageal reflux disease (GERD). You have cancer of the esophagus. There is a problem with how food moves through your esophagus. In some cases, you may need this procedure repeated at a later time to dilate the esophagus gradually. Tell a health care provider about: Any allergies you have. All medicines you are taking, including vitamins, herbs, eye drops, creams, and over-the-counter medicines. Any problems you or family members have had with anesthetic medicines. Any blood disorders you have. Any surgeries you have had. Any medical conditions you have. Any antibiotic medicines you are required to take before dental procedures. Whether you are pregnant or may be pregnant. What are the risks? Generally, this is a safe procedure. However, problems may occur, including: Bleeding due to a tear in the lining of the esophagus. A hole, or perforation, in the esophagus. What happens before the procedure? Ask your health care provider about: Changing or stopping your regular medicines. This is especially important if you are taking diabetes medicines or blood thinners. Taking medicines such as aspirin and ibuprofen. These medicines can thin your blood. Do not take these medicines unless your health care provider tells you to take them. Taking over-the-counter medicines, vitamins, herbs, and supplements. Follow instructions from your health care provider about eating or drinking restrictions. Plan to have a responsible adult take you home from  the hospital or clinic. Plan to have a responsible adult care for you for the time you are told after you leave the hospital or clinic. This is important. What happens during the procedure? You may be given a medicine to help you relax (sedative). A numbing medicine may be sprayed into the back of your throat, or you may gargle the medicine. Your health care provider may perform the dilatation using various surgical instruments, such as: Simple dilators. This instrument is carefully placed in the esophagus to stretch it. Guided wire bougies. This involves using an endoscope to insert a wire into the esophagus. A dilator is passed over this wire to enlarge the esophagus. Then the wire is removed. Balloon dilators. An endoscope with a small balloon is inserted into the esophagus. The balloon is inflated to stretch the esophagus and open it up. The procedure may vary among health care providers and hospitals. What can I expect after the procedure? Your  blood pressure, heart rate, breathing rate, and blood oxygen level will be monitored until you leave the hospital or clinic. Your throat may feel slightly sore and numb. This will get better over time. You will not be allowed to eat or drink until your throat is no longer numb. When you are able to drink, urinate, and sit on the edge of the bed without nausea or dizziness, you may be able to return home. Follow these instructions at home: Take over-the-counter and prescription medicines only as told by your health care provider. If you were given a sedative during the procedure, it can affect you for several hours. Do not drive or operate machinery until your health care provider says that it is safe. Plan to have a responsible adult care for you for the time you are told. This is important. Follow instructions from your health care provider about any eating or drinking restrictions. Do not use any products that contain nicotine or tobacco, such as  cigarettes, e-cigarettes, and chewing tobacco. If you need help quitting, ask your health care provider. Keep all follow-up visits. This is important. Contact a health care provider if: You have a fever. You have pain that is not relieved by medicine. Get help right away if: You have chest pain. You have trouble breathing. You have trouble swallowing. You vomit blood. You have black, tarry, or bloody stools. These symptoms may represent a serious problem that is an emergency. Do not wait to see if the symptoms will go away. Get medical help right away. Call your local emergency services (911 in the [place] Do not drive yourself to the hospital. Summary Esophageal dilatation, also called esophageal dilation, is a procedure to widen or open a blocked or narrowed part of the esophagus. Plan to have a responsible adult take you home from the hospital or clinic. For this procedure, a numbing medicine may be sprayed into the back of your throat, or you may gargle the medicine. Do not drive or operate machinery until your health care provider says that it is safe. This information is not intended to replace advice given to you by your health care provider. Make sure you discuss any questions you have with your health care provider. Document Revised: 01/11/2020 Document Reviewed: 01/11/2020 Elsevier Patient Education  [place] After The following information offers guidance on how to care for yourself after your procedure. Your health care provider may also give you more specific instructions. If you have problems or questions, contact your health care provider. What can I expect after the procedure? After the procedure, it is common to have: Tiredness. Little or no memory about what happened during or after the procedure. Impaired judgment when it comes to making decisions. Nausea or vomiting. Some trouble with balance. Follow these instructions at  home: For the time period you were told by your health care provider:  Rest. Do not participate in activities where you could fall or become injured. Do not drive or use machinery. Do not drink alcohol. Do not take sleeping pills or medicines that cause drowsiness. Do not make important decisions or sign legal documents. Do not take care of children on your own. Medicines Take over-the-counter and prescription medicines only as told by your health care provider. If you were prescribed antibiotics, take them as told by your health care provider. Do not stop using the antibiotic even if you start to feel better. Eating and drinking Follow instructions from your health care  provider about what you may eat and drink. Drink enough fluid to keep your urine pale yellow. If you vomit: Drink clear fluids slowly and in small amounts as you are able. Clear fluids include water, ice chips, low-calorie sports drinks, and fruit juice that has water added to it (diluted fruit juice). Eat light and bland foods in small amounts as you are able. These foods include bananas, applesauce, rice, lean meats, toast, and crackers. General instructions  Have a responsible adult stay with you for the time you are told. It is important to have someone help care for you until you are awake and alert. If you have sleep apnea, surgery and some medicines can increase your risk for breathing problems. Follow instructions from your health care provider about wearing your sleep device: When you are sleeping. This includes during daytime naps. While taking prescription pain medicines, sleeping medicines, or medicines that make you drowsy. Do not use any products that contain nicotine or tobacco. These products include cigarettes, chewing tobacco, and vaping devices, such as e-cigarettes. If you need help quitting, ask your health care provider. Contact a health care provider if: You feel nauseous or vomit every time you eat  or drink. You feel light-headed. You are still sleepy or having trouble with balance after 24 hours. You get a rash. You have a fever. You have redness or swelling around the IV site. Get help right away if: You have trouble breathing. You have new confusion after you get home. These symptoms may be an emergency. Get help right away. Call 911. Do not wait to see if the symptoms will go away. Do not drive yourself to the hospital. This information is not intended to replace advice given to you by your health care provider. Make sure you discuss any questions you have with your health care provider. Document Revised: 01/20/2022 Document Reviewed: 01/20/2022 Elsevier Patient Education  [place].

## 2022-07-03 NOTE — Telephone Encounter (Signed)
Pt was rescheduled from 07/09/22 at 1pm with Dr. Gala Romney, to 08/04/22 at 1:15 pm with Dr. Abbey Chatters. New instructions and pre-op date will be mailed to pt.

## 2022-07-04 ENCOUNTER — Encounter (HOSPITAL_COMMUNITY)
Admission: RE | Admit: 2022-07-04 | Discharge: 2022-07-04 | Disposition: A | Discharge status: Home / Self Care | Payer: 59 | Source: Ambulatory Visit | Attending: Internal Medicine | Admitting: Internal Medicine

## 2022-07-04 ENCOUNTER — Encounter (HOSPITAL_COMMUNITY): Payer: Self-pay

## 2022-07-04 ENCOUNTER — Encounter: Payer: Self-pay | Admitting: Student

## 2022-07-04 DIAGNOSIS — Z79899 Other long term (current) drug therapy: Secondary | ICD-10-CM

## 2022-07-04 LAB — TOXASSURE SELECT,+ANTIDEPR,UR

## 2022-07-04 NOTE — Progress Notes (Signed)
Called and notified the patient of the results of his Toxassure being expected. No change in management. All questions answered.

## 2022-07-09 ENCOUNTER — Ambulatory Visit (HOSPITAL_COMMUNITY): Admit: 2022-07-09 | Payer: 59 | Admitting: Internal Medicine

## 2022-07-09 ENCOUNTER — Encounter (HOSPITAL_COMMUNITY): Payer: Self-pay

## 2022-07-09 SURGERY — ESOPHAGOGASTRODUODENOSCOPY (EGD) WITH PROPOFOL
Anesthesia: Monitor Anesthesia Care

## 2022-07-14 ENCOUNTER — Encounter: Payer: 59 | Admitting: Student

## 2022-07-15 ENCOUNTER — Encounter: Payer: Self-pay | Admitting: Student

## 2022-07-15 ENCOUNTER — Other Ambulatory Visit: Payer: Self-pay

## 2022-07-15 ENCOUNTER — Ambulatory Visit (INDEPENDENT_AMBULATORY_CARE_PROVIDER_SITE_OTHER): Payer: 59 | Admitting: Student

## 2022-07-15 VITALS — BP 136/101 | HR 71 | Temp 98.0°F | Resp 24 | Ht 67.0 in | Wt 204.5 lb

## 2022-07-15 DIAGNOSIS — M25512 Pain in left shoulder: Secondary | ICD-10-CM | POA: Diagnosis not present

## 2022-07-15 NOTE — Patient Instructions (Signed)
[name], it was a pleasure seeing you today!  Today we discussed: - I hope your shoulder starts to feel better after the injection. If you feel like this worked well for you, we can do another injection in roughly three months.   Follow-up:  3 months    Please make sure to arrive 15 minutes prior to your next appointment. If you arrive late, you may be asked to reschedule.   We look forward to seeing you next time. Please call our clinic at [phone] if you have any questions or concerns. The best time to call is Monday-Friday from 9am-4pm, but there is someone available 24/7. If after hours or the weekend, call the main hospital number and ask for the Internal Medicine Resident On-Call. If you need medication refills, please notify your pharmacy one week in advance and they will send [place] a request.  Thank you for letting us take part in your care. Wishing you the best!  Thank you, Sanjuan Dame, MD

## 2022-07-20 NOTE — Assessment & Plan Note (Addendum)
Patient returning to clinic today with significant shoulder pain. He was seen in our clinic on 10/19 after a fall. At that time, shoulder XR negative for fractures and he was started on duloxetine. Since that time he has also been taking Tylenol and voltaren gel, however the pain has continued. He is presenting today for a steroid shot in the shoulder for pain relief. On examination, patient is severely limited in passive and active range of motion due to pain with a positive Hawkins test. We will plan for steroid shot today. We discussed possibly giving another shot in a few months if the pain returns.  - Shoulder steroid injection today - Continue voltaren gel - Continue duloxetine '30mg'$  daily

## 2022-07-20 NOTE — Progress Notes (Signed)
CC: shoulder injection  HPI:  [name] with medical history as below presenting to [facility] for shoulder injection.  Please see problem-based list for further details, assessments, and plans.  Past Medical History:  Diagnosis Date   Acute pancreatitis    Anxiety    Arthritis    Colonic adenoma    COPD (chronic obstructive pulmonary disease) (HCC)    Depression    Diverticulitis of colon with perforation 08/2010   GERD (gastroesophageal reflux disease)    History of hiatal hernia    Hypertension    Polysubstance abuse ([place])    Renal insufficiency    Sleep apnea    Intolerant of CPAP   Stroke ([place])    TIA (transient ischemic attack)    Review of Systems:  As per HPI  Physical Exam:  Vitals:   07/15/22 1016  BP: (!) 136/101  Pulse: 71  Resp: (!) 24  Temp: 98 F (36.7 C)  TempSrc: Oral  SpO2: 98%  Weight: 204 lb 8 oz (92.8 kg)  Height: '5\' 7"'$  (1.702 m)   General: Resting comfortably in no acute distress Pulm: Normal work of breathing on room air. MSK: No bony tenderness on L shoulder. Point tenderness in subacromial space and glenohumeral space on L shoulder. Severely limited passive and active range of motion in all directions of L shoulder. Hawkins test positive on L.  Skin: Warm, dry. No rashes or lesions appreciated. Psych: Normal mood, affect, speech.  Assessment & Plan:   Acute pain of left shoulder Patient returning to clinic today with significant shoulder pain. He was seen in our clinic on 10/19 after a fall. At that time, shoulder XR negative for fractures and he was started on duloxetine. Since that time he has also been taking Tylenol and voltaren gel, however the pain has continued. He is presenting today for a steroid shot in the shoulder for pain relief. On examination, patient is severely limited in passive and active range of motion due to pain with a positive Hawkins test. We will plan for steroid shot today. We discussed  possibly giving another shot in a few months if the pain returns.  - Shoulder steroid injection today - Continue voltaren gel - Continue duloxetine '30mg'$  daily  PROCEDURE NOTE  PROCEDURE: left shoulder joint steroid injection.  PREOPERATIVE DIAGNOSIS: Bursitis of the left shoulder.  POSTOPERATIVE DIAGNOSIS: Bursitis of the left shoulder.  PROCEDURE: The patient was apprised of the risks and the benefits of the procedure and informed consent was obtained, as witnessed by Dr. Philipp Ovens. Time-out procedure was performed, with confirmation of the patient's name, date of birth, and correct identification of the left shoulder to be injected. The patient's shoulder was then marked at the appropriate site for injection placement. The shoulder was sterilely prepped with Betadine. A 10 mg (1 milliliter) solution of Kenalog was drawn up into a 3 mL syringe with a 2 mL of 1% lidocaine. The patient was injected with a 25 gauge needle at the posterior  aspect of his  left shoulder. There were no complications. The patient tolerated the procedure well. There was minimal bleeding. The patient was instructed to ice her shoulder upon leaving clinic and refrain from overuse over the next 3 days. The patient was instructed to go to the emergency room with any usual pain, swelling, or redness occurred in the injected area. The patient was given a followup appointment to evaluate response to the injection to his increased range  of motion and reduction of pain.  The procedure and patient presentation was supervised by and discussed with attending physician, Dr.  Philipp Ovens .  Sanjuan Dame, MD Internal Medicine PGY-3 Pager: [phone]

## 2022-07-25 NOTE — Patient Instructions (Signed)
[name]  07/25/2022     '@PREFPERIOPPHARMACY'$ @   Your procedure is scheduled on  08/04/2022.   Report to [facility] at  1100  A.M.   Call this number if you have problems the morning of surgery:  [phone]  If you experience any cold or flu symptoms such as cough, fever, chills, shortness of breath, etc. between now and your scheduled surgery, please notify us at the above number.   Remember:  Follow the diet instructions given to you by the office.     Take these medicines the morning of surgery with A SIP OF WATER                      naloxone, cymblata, pantoprazole.     Do not wear jewelry, make-up or nail polish.  Do not wear lotions, powders, or perfumes, or deodorant.  Do not shave 48 hours prior to surgery.  Men may shave face and neck.  Do not bring valuables to the hospital.  [facility] is not responsible for any belongings or valuables.  Contacts, dentures or bridgework may not be worn into surgery.  Leave your suitcase in the car.  After surgery it may be brought to your room.  For patients admitted to the hospital, discharge time will be determined by your treatment team.  Patients discharged the day of surgery will not be allowed to drive home and must have someone with them for 24 hours.    Special instructions:   DO NOT smoke tobacco or vape for 24 hours.  Please read over the following fact sheets that you were given. Anesthesia Post-op Instructions and Care and Recovery After Surgery      Upper Endoscopy, Adult, Care After After the procedure, it is common to have a sore throat. It is also common to have: Mild stomach pain or discomfort. Bloating. Nausea. Follow these instructions at home: The instructions below may help you care for yourself at home. Your health care provider may give you more instructions. If you have questions, ask your health care provider. If you were given a sedative during the procedure, it can  affect you for several hours. Do not drive or operate machinery until your health care provider says that it is safe. If you will be going home right after the procedure, plan to have a responsible adult: Take you home from the hospital or clinic. You will not be allowed to drive. Care for you for the time you are told. Follow instructions from your health care provider about what you may eat and drink. Return to your normal activities as told by your health care provider. Ask your health care provider what activities are safe for you. Take over-the-counter and prescription medicines only as told by your health care provider. Contact a health care provider if you: Have a sore throat that lasts longer than one day. Have trouble swallowing. Have a fever. Get help right away if you: Vomit blood or your vomit looks like coffee grounds. Have bloody, black, or tarry stools. Have a very bad sore throat or you cannot swallow. Have difficulty breathing or very bad pain in your chest or abdomen. These symptoms may be an emergency. Get help right away. Call 911. Do not wait to see if the symptoms will go away. Do not drive yourself to the hospital. Summary After the procedure, it is common to have a sore throat, mild stomach  discomfort, bloating, and nausea. If you were given a sedative during the procedure, it can affect you for several hours. Do not drive until your health care provider says that it is safe. Follow instructions from your health care provider about what you may eat and drink. Return to your normal activities as told by your health care provider. This information is not intended to replace advice given to you by your health care provider. Make sure you discuss any questions you have with your health care provider. Document Revised: 12/04/2021 Document Reviewed: 12/04/2021 Elsevier Patient Education  [place]. Esophageal Dilatation Esophageal dilatation, also called  esophageal dilation, is a procedure to widen or open a blocked or narrowed part of the esophagus. The esophagus is the part of the body that moves food and liquid from the mouth to the stomach. You may need this procedure if: You have a buildup of scar tissue in your esophagus that makes it difficult, painful, or impossible to swallow. This can be caused by gastroesophageal reflux disease (GERD). You have cancer of the esophagus. There is a problem with how food moves through your esophagus. In some cases, you may need this procedure repeated at a later time to dilate the esophagus gradually. Tell a health care provider about: Any allergies you have. All medicines you are taking, including vitamins, herbs, eye drops, creams, and over-the-counter medicines. Any problems you or family members have had with anesthetic medicines. Any blood disorders you have. Any surgeries you have had. Any medical conditions you have. Any antibiotic medicines you are required to take before dental procedures. Whether you are pregnant or may be pregnant. What are the risks? Generally, this is a safe procedure. However, problems may occur, including: Bleeding due to a tear in the lining of the esophagus. A hole, or perforation, in the esophagus. What happens before the procedure? Ask your health care provider about: Changing or stopping your regular medicines. This is especially important if you are taking diabetes medicines or blood thinners. Taking medicines such as aspirin and ibuprofen. These medicines can thin your blood. Do not take these medicines unless your health care provider tells you to take them. Taking over-the-counter medicines, vitamins, herbs, and supplements. Follow instructions from your health care provider about eating or drinking restrictions. Plan to have a responsible adult take you home from the hospital or clinic. Plan to have a responsible adult care for you for the time you are told  after you leave the hospital or clinic. This is important. What happens during the procedure? You may be given a medicine to help you relax (sedative). A numbing medicine may be sprayed into the back of your throat, or you may gargle the medicine. Your health care provider may perform the dilatation using various surgical instruments, such as: Simple dilators. This instrument is carefully placed in the esophagus to stretch it. Guided wire bougies. This involves using an endoscope to insert a wire into the esophagus. A dilator is passed over this wire to enlarge the esophagus. Then the wire is removed. Balloon dilators. An endoscope with a small balloon is inserted into the esophagus. The balloon is inflated to stretch the esophagus and open it up. The procedure may vary among health care providers and hospitals. What can I expect after the procedure? Your blood pressure, heart rate, breathing rate, and blood oxygen level will be monitored until you leave the hospital or clinic. Your throat may feel slightly sore and numb. This will get better over  time. You will not be allowed to eat or drink until your throat is no longer numb. When you are able to drink, urinate, and sit on the edge of the bed without nausea or dizziness, you may be able to return home. Follow these instructions at home: Take over-the-counter and prescription medicines only as told by your health care provider. If you were given a sedative during the procedure, it can affect you for several hours. Do not drive or operate machinery until your health care provider says that it is safe. Plan to have a responsible adult care for you for the time you are told. This is important. Follow instructions from your health care provider about any eating or drinking restrictions. Do not use any products that contain nicotine or tobacco, such as cigarettes, e-cigarettes, and chewing tobacco. If you need help quitting, ask your health care  provider. Keep all follow-up visits. This is important. Contact a health care provider if: You have a fever. You have pain that is not relieved by medicine. Get help right away if: You have chest pain. You have trouble breathing. You have trouble swallowing. You vomit blood. You have black, tarry, or bloody stools. These symptoms may represent a serious problem that is an emergency. Do not wait to see if the symptoms will go away. Get medical help right away. Call your local emergency services (911 in the [place] Do not drive yourself to the hospital. Summary Esophageal dilatation, also called esophageal dilation, is a procedure to widen or open a blocked or narrowed part of the esophagus. Plan to have a responsible adult take you home from the hospital or clinic. For this procedure, a numbing medicine may be sprayed into the back of your throat, or you may gargle the medicine. Do not drive or operate machinery until your health care provider says that it is safe. This information is not intended to replace advice given to you by your health care provider. Make sure you discuss any questions you have with your health care provider. Document Revised: 01/11/2020 Document Reviewed: 01/11/2020 Elsevier Patient Education  [place] After The following information offers guidance on how to care for yourself after your procedure. Your health care provider may also give you more specific instructions. If you have problems or questions, contact your health care provider. What can I expect after the procedure? After the procedure, it is common to have: Tiredness. Little or no memory about what happened during or after the procedure. Impaired judgment when it comes to making decisions. Nausea or vomiting. Some trouble with balance. Follow these instructions at home: For the time period you were told by your health care provider:  Rest. Do not participate  in activities where you could fall or become injured. Do not drive or use machinery. Do not drink alcohol. Do not take sleeping pills or medicines that cause drowsiness. Do not make important decisions or sign legal documents. Do not take care of children on your own. Medicines Take over-the-counter and prescription medicines only as told by your health care provider. If you were prescribed antibiotics, take them as told by your health care provider. Do not stop using the antibiotic even if you start to feel better. Eating and drinking Follow instructions from your health care provider about what you may eat and drink. Drink enough fluid to keep your urine pale yellow. If you vomit: Drink clear fluids slowly and in small amounts as you are able. Clear  fluids include water, ice chips, low-calorie sports drinks, and fruit juice that has water added to it (diluted fruit juice). Eat light and bland foods in small amounts as you are able. These foods include bananas, applesauce, rice, lean meats, toast, and crackers. General instructions  Have a responsible adult stay with you for the time you are told. It is important to have someone help care for you until you are awake and alert. If you have sleep apnea, surgery and some medicines can increase your risk for breathing problems. Follow instructions from your health care provider about wearing your sleep device: When you are sleeping. This includes during daytime naps. While taking prescription pain medicines, sleeping medicines, or medicines that make you drowsy. Do not use any products that contain nicotine or tobacco. These products include cigarettes, chewing tobacco, and vaping devices, such as e-cigarettes. If you need help quitting, ask your health care provider. Contact a health care provider if: You feel nauseous or vomit every time you eat or drink. You feel light-headed. You are still sleepy or having trouble with balance after 24  hours. You get a rash. You have a fever. You have redness or swelling around the IV site. Get help right away if: You have trouble breathing. You have new confusion after you get home. These symptoms may be an emergency. Get help right away. Call 911. Do not wait to see if the symptoms will go away. Do not drive yourself to the hospital. This information is not intended to replace advice given to you by your health care provider. Make sure you discuss any questions you have with your health care provider. Document Revised: 01/20/2022 Document Reviewed: 01/20/2022 Elsevier Patient Education  [place].

## 2022-07-29 ENCOUNTER — Encounter (HOSPITAL_COMMUNITY)
Admission: RE | Admit: 2022-07-29 | Discharge: 2022-07-29 | Disposition: A | Discharge status: Home / Self Care | Payer: 59 | Source: Ambulatory Visit | Attending: Internal Medicine | Admitting: Internal Medicine

## 2022-07-29 ENCOUNTER — Other Ambulatory Visit: Payer: Self-pay | Admitting: Internal Medicine

## 2022-07-29 ENCOUNTER — Encounter (HOSPITAL_COMMUNITY): Payer: Self-pay

## 2022-07-29 VITALS — BP 139/87 | HR 77 | Temp 98.0°F | Resp 18 | Ht 67.0 in | Wt 204.5 lb

## 2022-07-29 DIAGNOSIS — Z01812 Encounter for preprocedural laboratory examination: Secondary | ICD-10-CM | POA: Insufficient documentation

## 2022-07-29 DIAGNOSIS — F1110 Opioid abuse, uncomplicated: Secondary | ICD-10-CM

## 2022-07-29 DIAGNOSIS — Z79899 Other long term (current) drug therapy: Secondary | ICD-10-CM | POA: Insufficient documentation

## 2022-07-29 LAB — BASIC METABOLIC PANEL
Anion gap: 8 (ref 5–15)
BUN: 21 mg/dL — ABNORMAL HIGH (ref 6–20)
CO2: 28 mmol/L (ref 22–32)
Calcium: 9 mg/dL (ref 8.9–10.3)
Chloride: 102 mmol/L (ref 98–111)
Creatinine, Ser: 1.21 mg/dL (ref 0.61–1.24)
GFR, Estimated: >60 mL/min (ref >60)
Glucose, Bld: 104 mg/dL — ABNORMAL HIGH (ref 70–99)
Potassium: 4.1 mmol/L (ref 3.5–5.1)
Sodium: 138 mmol/L (ref 135–145)

## 2022-07-29 NOTE — Progress Notes (Signed)
Internal Medicine Clinic Attending  I saw and evaluated the patient.  I personally confirmed the key portions of the history and exam documented by Dr. Braswell and I reviewed pertinent patient test results.  The assessment, diagnosis, and plan were formulated together and I agree with the documentation in the resident's note.   I was present for the procedure.  

## 2022-07-29 NOTE — Addendum Note (Signed)
Addended by: Jodean Lima on: 07/29/2022 01:04 PM   Modules accepted: Level of Service

## 2022-07-29 NOTE — Telephone Encounter (Signed)
LOV- 07/15/22

## 2022-08-04 ENCOUNTER — Other Ambulatory Visit: Payer: Self-pay

## 2022-08-04 ENCOUNTER — Encounter (HOSPITAL_COMMUNITY): Payer: Self-pay

## 2022-08-04 ENCOUNTER — Ambulatory Visit (HOSPITAL_COMMUNITY): Payer: 59 | Admitting: Anesthesiology

## 2022-08-04 ENCOUNTER — Encounter (HOSPITAL_COMMUNITY)
Admission: RE | Disposition: A | Discharge status: Home / Self Care | Payer: Self-pay | Source: Home / Self Care | Attending: Internal Medicine

## 2022-08-04 ENCOUNTER — Ambulatory Visit (HOSPITAL_BASED_OUTPATIENT_CLINIC_OR_DEPARTMENT_OTHER): Payer: 59 | Admitting: Anesthesiology

## 2022-08-04 ENCOUNTER — Ambulatory Visit (HOSPITAL_COMMUNITY)
Admission: RE | Admit: 2022-08-04 | Discharge: 2022-08-04 | Disposition: A | Discharge status: Home / Self Care | Payer: 59 | Attending: Internal Medicine | Admitting: Internal Medicine

## 2022-08-04 DIAGNOSIS — Z79899 Other long term (current) drug therapy: Secondary | ICD-10-CM | POA: Insufficient documentation

## 2022-08-04 DIAGNOSIS — N289 Disorder of kidney and ureter, unspecified: Secondary | ICD-10-CM | POA: Diagnosis not present

## 2022-08-04 DIAGNOSIS — K219 Gastro-esophageal reflux disease without esophagitis: Secondary | ICD-10-CM | POA: Diagnosis not present

## 2022-08-04 DIAGNOSIS — J449 Chronic obstructive pulmonary disease, unspecified: Secondary | ICD-10-CM | POA: Diagnosis not present

## 2022-08-04 DIAGNOSIS — K449 Diaphragmatic hernia without obstruction or gangrene: Secondary | ICD-10-CM | POA: Diagnosis not present

## 2022-08-04 DIAGNOSIS — R1310 Dysphagia, unspecified: Secondary | ICD-10-CM | POA: Diagnosis not present

## 2022-08-04 DIAGNOSIS — R1319 Other dysphagia: Secondary | ICD-10-CM

## 2022-08-04 DIAGNOSIS — G709 Myoneural disorder, unspecified: Secondary | ICD-10-CM | POA: Insufficient documentation

## 2022-08-04 DIAGNOSIS — K222 Esophageal obstruction: Secondary | ICD-10-CM

## 2022-08-04 DIAGNOSIS — F17210 Nicotine dependence, cigarettes, uncomplicated: Secondary | ICD-10-CM | POA: Diagnosis not present

## 2022-08-04 DIAGNOSIS — R69 Illness, unspecified: Secondary | ICD-10-CM | POA: Diagnosis not present

## 2022-08-04 DIAGNOSIS — F32A Depression, unspecified: Secondary | ICD-10-CM | POA: Diagnosis not present

## 2022-08-04 DIAGNOSIS — G4730 Sleep apnea, unspecified: Secondary | ICD-10-CM | POA: Insufficient documentation

## 2022-08-04 DIAGNOSIS — F419 Anxiety disorder, unspecified: Secondary | ICD-10-CM | POA: Insufficient documentation

## 2022-08-04 DIAGNOSIS — I10 Essential (primary) hypertension: Secondary | ICD-10-CM

## 2022-08-04 DIAGNOSIS — F1110 Opioid abuse, uncomplicated: Secondary | ICD-10-CM

## 2022-08-04 HISTORY — PX: ESOPHAGOGASTRODUODENOSCOPY (EGD) WITH PROPOFOL: SHX5813

## 2022-08-04 HISTORY — PX: BALLOON DILATION: SHX5330

## 2022-08-04 SURGERY — ESOPHAGOGASTRODUODENOSCOPY (EGD) WITH PROPOFOL
Anesthesia: General

## 2022-08-04 MED ORDER — LIDOCAINE HCL (CARDIAC) PF 100 MG/5ML IV SOSY
PREFILLED_SYRINGE | INTRAVENOUS | Status: DC | PRN
  Administered 2022-08-04: 50 mg via INTRAVENOUS

## 2022-08-04 MED ORDER — PROPOFOL 10 MG/ML IV BOLUS
INTRAVENOUS | Status: DC | PRN
  Administered 2022-08-04: 30 mg via INTRAVENOUS
  Administered 2022-08-04: 100 mg via INTRAVENOUS
  Administered 2022-08-04: 20 mg via INTRAVENOUS
  Administered 2022-08-04: 50 mg via INTRAVENOUS

## 2022-08-04 MED ORDER — LACTATED RINGERS IV SOLN: INTRAVENOUS | Status: DC

## 2022-08-04 NOTE — Op Note (Signed)
[facility] Patient Name: [name] Procedure Date: 08/04/2022 12:46 PM MRN: [id number] Date of Birth: 1966/03/09 Attending MD: Elon Alas. Abbey Chatters , [place], [id number] CSN: [id number] Age: 56 Admit Type: Outpatient Procedure:                Upper GI endoscopy Indications:              Dysphagia, Abnormal esophageal manometry Providers:                Elon Alas. Abbey Chatters, DO, Lambert Mody, Raphael Gibney, Technician Referring MD:              Medicines:                See the Anesthesia note for documentation of the                            administered medications Complications:            No immediate complications. Estimated Blood Loss:     Estimated blood loss was minimal. Procedure:                Pre-Anesthesia Assessment:                           - The anesthesia plan was to use monitored                            anesthesia care (MAC).                           After obtaining informed consent, the endoscope was                            passed under direct vision. Throughout the                            procedure, the patient's blood pressure, pulse, and                            oxygen saturations were monitored continuously. The                            [id number]-H190 ([id number]) scope was introduced through the                            mouth, and advanced to the second part of duodenum.                            The upper GI endoscopy was accomplished without                            difficulty. The patient tolerated the procedure                            well. Scope In: 12:55:49  PM Scope Out: 1:03:09 PM Total Procedure Duration: 0 hours 7 minutes 20 seconds  Findings:      A 3 cm hiatal hernia was present.      A mild Schatzki ring was found in the lower third of the esophagus. A       TTS dilator was passed through the scope. Dilation with an 18-19-20 mm       balloon dilator was performed to 20 mm. The dilation site was  examined       and showed mild mucosal disruption and moderate improvement in luminal       narrowing.      The entire examined stomach was normal.      The duodenal bulb, first portion of the duodenum and second portion of       the duodenum were normal. Impression:               - 3 cm hiatal hernia.                           - Mild Schatzki ring. Dilated.                           - Normal stomach.                           - Normal duodenal bulb, first portion of the                            duodenum and second portion of the duodenum.                           - No specimens collected. Moderate Sedation:      Per Anesthesia Care Recommendation:           - Patient has a contact number available for                            emergencies. The signs and symptoms of potential                            delayed complications were discussed with the                            patient. Return to normal activities tomorrow.                            Written discharge instructions were provided to the                            patient.                           - Resume previous diet.                           - Continue present medications.                           -  Repeat upper endoscopy PRN for retreatment.                           - Return to GI clinic in 3 months. Procedure Code(s):        --- Professional ---                           [phone], Esophagogastroduodenoscopy, flexible,                            transoral; with transendoscopic balloon dilation of                            esophagus (less than 30 mm diameter) Diagnosis Code(s):        --- Professional ---                           K44.9, Diaphragmatic hernia without obstruction or                            gangrene                           K22.2, Esophageal obstruction                           R13.10, Dysphagia, unspecified CPT copyright 2022 American Medical Association. All rights reserved. The codes  documented in this report are preliminary and upon coder review may  be revised to meet current compliance requirements. Elon Alas. Abbey Chatters, DO [name], DO 08/04/2022 1:10:34 PM This report has been signed electronically. Number of Addenda: 0

## 2022-08-04 NOTE — Discharge Instructions (Signed)
EGD Discharge instructions Please read the instructions outlined below and refer to this sheet in the next few weeks. These discharge instructions provide you with general information on caring for yourself after you leave the hospital. Your doctor may also give you specific instructions. While your treatment has been planned according to the most current medical practices available, unavoidable complications occasionally occur. If you have any problems or questions after discharge, please call your doctor. ACTIVITY You may resume your regular activity but move at a slower pace for the next 24 hours.  Take frequent rest periods for the next 24 hours.  Walking will help expel (get rid of) the air and reduce the bloated feeling in your abdomen.  No driving for 24 hours (because of the anesthesia (medicine) used during the test).  You may shower.  Do not sign any important legal documents or operate any machinery for 24 hours (because of the anesthesia used during the test).  NUTRITION Drink plenty of fluids.  You may resume your normal diet.  Begin with a light meal and progress to your normal diet.  Avoid alcoholic beverages for 24 hours or as instructed by your caregiver.  MEDICATIONS You may resume your normal medications unless your caregiver tells you otherwise.  WHAT YOU CAN EXPECT TODAY You may experience abdominal discomfort such as a feeling of fullness or "gas" pains.  FOLLOW-UP Your doctor will discuss the results of your test with you.  SEEK IMMEDIATE MEDICAL ATTENTION IF ANY OF THE FOLLOWING OCCUR: Excessive nausea (feeling sick to your stomach) and/or vomiting.  Severe abdominal pain and distention (swelling).  Trouble swallowing.  Temperature over 101 F (37.8 C).  Rectal bleeding or vomiting of blood.    Your upper endoscopy revealed a small hiatal hernia.  You had a tightening of your esophagus called a Schatzki's ring which I stretched today.  Hopefully this helps with  feeling of food getting stuck in your substernal region.  Otherwise stomach and small bowel appeared normal.  Continue on pantoprazole daily.  Follow-up with GI in 3 to 4 months.  I hope you have a great rest of your week!  [name]. Abbey Chatters, D.O. Gastroenterology and Hepatology [facility]

## 2022-08-04 NOTE — Anesthesia Postprocedure Evaluation (Signed)
Anesthesia Post Note  Patient: [name]  Procedure(s) Performed: ESOPHAGOGASTRODUODENOSCOPY (EGD) WITH PROPOFOL BALLOON DILATION  Patient location during evaluation: Phase II Anesthesia Type: General Level of consciousness: awake and alert and oriented Pain management: pain level controlled Vital Signs Assessment: post-procedure vital signs reviewed and stable Respiratory status: spontaneous breathing, nonlabored ventilation and respiratory function stable Cardiovascular status: blood pressure returned to baseline and stable Postop Assessment: no apparent nausea or vomiting Anesthetic complications: no  No notable events documented.   Last Vitals:  Vitals:   08/04/22 1213 08/04/22 1307  BP: (!) 157/102 (!) 136/90  Pulse: 73 68  Resp: 16 15  Temp: 37.1 C 36.7 C  SpO2: 99% 93%    Last Pain:  Vitals:   08/04/22 1307  TempSrc: Axillary  PainSc: 0-No pain                 Giuseppe Duchemin C Ardys [place]

## 2022-08-04 NOTE — Anesthesia Preprocedure Evaluation (Addendum)
Anesthesia Evaluation  Patient identified by MRN, date of birth, ID band Patient awake    Reviewed: Allergy & Precautions, H&P , NPO status , Patient's Chart, lab work & pertinent test results  Airway Mallampati: II  TM Distance: >3 FB Neck ROM: Full    Dental  (+) Edentulous Upper, Edentulous Lower   Pulmonary sleep apnea , COPD,  COPD inhaler, Current Smoker   Pulmonary exam normal breath sounds clear to auscultation       Cardiovascular Exercise Tolerance: Good hypertension, Pt. on medications Normal cardiovascular exam Rhythm:Regular Rate:Normal  1. Left ventricular ejection fraction, by estimation, is 65 to 70%. The  left ventricle has normal function. The left ventricle has no regional  wall motion abnormalities. There is moderate left ventricular hypertrophy.  Left ventricular diastolic  parameters were normal.   2. Right ventricular systolic function is normal. The right ventricular  size is normal. Tricuspid regurgitation signal is inadequate for assessing  PA pressure.   3. The mitral valve is grossly normal. Trivial mitral valve  regurgitation.   4. The aortic valve is tricuspid. Aortic valve regurgitation is not  visualized.   5. The inferior vena cava is normal in size with greater than 50%  respiratory variability, suggesting right atrial pressure of 3 mmHg     Neuro/Psych  PSYCHIATRIC DISORDERS Anxiety Depression    TIA Neuromuscular disease CVA    GI/Hepatic hiatal hernia,GERD  Medicated and Controlled,,(+)     substance abuse  cocaine use and marijuana use  Endo/Other  negative endocrine ROS    Renal/GU Renal InsufficiencyRenal disease  negative genitourinary   Musculoskeletal  (+) Arthritis , Osteoarthritis,  narcotic dependent  Abdominal   Peds negative pediatric ROS (+)  Hematology negative hematology ROS (+)   Anesthesia Other Findings   Reproductive/Obstetrics negative OB ROS                              Anesthesia Physical Anesthesia Plan  ASA: 3  Anesthesia Plan: General   Post-op Pain Management: Minimal or no pain anticipated   Induction: Intravenous  PONV Risk Score and Plan: Propofol infusion  Airway Management Planned: Nasal Cannula and Natural Airway  Additional Equipment:   Intra-op Plan:   Post-operative Plan:   Informed Consent: I have reviewed the patients History and Physical, chart, labs and discussed the procedure including the risks, benefits and alternatives for the proposed anesthesia with the patient or authorized representative who has indicated his/her understanding and acceptance.       Plan Discussed with: CRNA, Anesthesiologist and Surgeon  Anesthesia Plan Comments:         Anesthesia Quick Evaluation

## 2022-08-04 NOTE — Transfer of Care (Signed)
Immediate Anesthesia Transfer of Care Note  Patient: [name]  Procedure(s) Performed: ESOPHAGOGASTRODUODENOSCOPY (EGD) WITH PROPOFOL BALLOON DILATION  Patient Location: Short Stay  Anesthesia Type:General  Level of Consciousness: drowsy  Airway & Oxygen Therapy: Patient Spontanous Breathing  Post-op Assessment: Report given to RN and Post -op Vital signs reviewed and stable  Post vital signs: Reviewed and stable  Last Vitals:  Vitals Value Taken Time  BP 136/90 08/04/22 1307  Temp 36.7 C 08/04/22 1307  Pulse 68 08/04/22 1307  Resp 15 08/04/22 1307  SpO2 93 % 08/04/22 1307    Last Pain:  Vitals:   08/04/22 1307  TempSrc: Axillary  PainSc: 0-No pain         Complications: No notable events documented.

## 2022-08-04 NOTE — H&P (Signed)
Primary Care Physician:  Linward Natal, MD Primary Gastroenterologist:  Dr. Abbey Chatters  Pre-Procedure History & Physical: HPI:  [name] is a 56 y.o. male is  here for an EGD with possible dilation due to chronic dysphagia, abnormal esophageal manometry.  Past Medical History:  Diagnosis Date   Acute pancreatitis    Anxiety    Arthritis    Colonic adenoma    COPD (chronic obstructive pulmonary disease) (HCC)    Depression    Diverticulitis of colon with perforation 08/2010   GERD (gastroesophageal reflux disease)    History of hiatal hernia    Hypertension    Polysubstance abuse ([place])    Renal insufficiency    Sleep apnea    Intolerant of CPAP   Stroke ([place])    TIA (transient ischemic attack)     Past Surgical History:  Procedure Laterality Date   APPENDECTOMY     BIOPSY  12/14/2017   Procedure: BIOPSY;  Surgeon: Daneil Dolin, MD;  [facility];  Service: Endoscopy;;  gastric    BIOPSY  10/13/2019   Procedure: BIOPSY;  Surgeon: Daneil Dolin, MD;  [facility];  Service: Endoscopy;;  esophagus   BIOPSY  05/20/2021   Procedure: BIOPSY;  Surgeon: Daneil Dolin, MD;  [facility];  Service: Endoscopy;;   clamp left in colon after abd surgery     COLON SURGERY  05/2011   [facility]. exp laparotomy with colostomy takedown, lysis of adhesions (3hours), loop ileostomy. complicated by abscess formation requiring percutaneous drainage   colonoscopy via stoma with endoscopy of [name]  04/2011   [facility]. two sessile polyps in trv colon. complete resection but only partial retrieval. congested mucosa in area 3cm proximal to stoma, erythematous mucosa in Hartmann pouch. repeat tcs 8/2017path-adenomatous polyp   COLONOSCOPY WITH PROPOFOL N/A 12/14/2017   Dr. Gala Romney: 2 4 to 6 mm polyps in the cecum, tubular adenomas, single rectal polyp tubular adenoma, nonbleeding internal hemorrhoids.  Appears to be a surgical clip at the appendiceal  orifice.  Next colonoscopy in 5 years   ESOPHAGEAL MANOMETRY N/A 12/11/2021   Procedure: ESOPHAGEAL MANOMETRY (EM);  Surgeon: Mauri Pole, MD;  Location: [facility];  Service: Endoscopy;  Laterality: N/A;   ESOPHAGOGASTRODUODENOSCOPY  03/05/2012   erosive reflux esophagitis, bulbar erosions, gastric erosions/petichae, bx showed gastritis, no H.Pylori. Procedure: ESOPHAGOGASTRODUODENOSCOPY (EGD);  Surgeon: Daneil Dolin, MD;  [facility];  Service: Endoscopy;  Laterality: N/A;  11:30   ESOPHAGOGASTRODUODENOSCOPY (EGD) WITH PROPOFOL N/A 12/14/2017   Dr. Gala Romney: Erosive reflux esophagitis with questionable mild stricturing/non-obstruction at this level status post dilation.  multiple gastric erosions in the stomach biopsy showed reactive gastropathy but no H. pylori.   ESOPHAGOGASTRODUODENOSCOPY (EGD) WITH PROPOFOL N/A 10/13/2019   negative Barrett's, normal stomach   ESOPHAGOGASTRODUODENOSCOPY (EGD) WITH PROPOFOL N/A 05/20/2021   inlet patch with superimposed nodule s/p biopsy. Normal esophagus s/p dilation. Normal gastric mucosa. Normal duodenum. Reflux changes on path.   HERNIA REPAIR     KNEE SURGERY Right 2006   MALONEY DILATION N/A 12/14/2017   Procedure: Venia Minks DILATION;  Surgeon: Daneil Dolin, MD;  [facility];  Service: Endoscopy;  Laterality: N/A;   MALONEY DILATION  05/20/2021   Procedure: Venia Minks DILATION;  Surgeon: Daneil Dolin, MD;  [facility];  Service: Endoscopy;;   MULTIPLE EXTRACTIONS WITH ALVEOLOPLASTY  02/23/2012   Procedure: MULTIPLE EXTRACION WITH ALVEOLOPLASTY;  Surgeon: Gae Bon, DDS;  Location:  [facility];  Service: Oral Surgery;  Laterality: Bilateral;  Removal of left mandibular torus, multiple extractions with alveoloplasty   POLYPECTOMY  12/14/2017   Procedure: POLYPECTOMY;  Surgeon: Daneil Dolin, MD;  [facility];  Service: Endoscopy;;   reversal of colostomy  10/2011   per patient, did not have  report   right knee surgery     sigmoid colectomy with end colostomy, Hartmann's pouch  08/2010   [facility]. complicated diverticulitis   UMBILICAL HERNIA REPAIR      Prior to Admission medications   Medication Sig Start Date End Date Taking? Authorizing Provider  albuterol (PROVENTIL HFA;VENTOLIN HFA) 108 (90 Base) MCG/ACT inhaler Inhale 2 puffs into the lungs every 6 (six) hours as needed for wheezing or shortness of breath.   Yes [provider]  Buprenorphine HCl-Naloxone HCl 8-2 MG FILM PLACE 1 STRIP UNDER THE TONGUE TWICE DAILY 07/01/22  Yes Velna Ochs, MD  diclofenac Sodium (VOLTAREN) 1 % GEL APPLY 2 GRAMS  TOPICALLY 4 TIMES DAILY Patient taking differently: Apply 2 g topically 4 (four) times daily as needed (shoulder pain.). 01/14/22  Yes Rehman, Areeg N, DO  DULoxetine (CYMBALTA) 30 MG capsule Take 1 capsule (30 mg total) by mouth daily. 06/26/22 06/26/23 Yes Johny Blamer, DO  hydrochlorothiazide (HYDRODIURIL) 25 MG tablet Take 1 tablet (25 mg total) by mouth daily. 05/27/21  Yes Angelica Pou, MD  hydrocortisone (ANUSOL-HC) 2.5 % rectal cream Place 1 Application rectally 2 (two) times daily as needed for hemorrhoids. 05/28/22  Yes Damichael Hofman K, DO  pantoprazole (PROTONIX) 40 MG tablet Take 1 tablet (40 mg total) by mouth 2 (two) times daily. Patient taking differently: Take 40 mg by mouth daily. 05/28/22 05/28/23 Yes Harmonee Tozer, Elon Alas, DO  spironolactone (ALDACTONE) 50 MG tablet Take 1 tablet (50 mg total) by mouth daily. 05/26/22 05/26/23 Yes Linward Natal, MD  sildenafil (VIAGRA) 100 MG tablet Take 1 tablet (100 mg total) by mouth as needed for erectile dysfunction. 04/09/21 06/30/24  Rehman, Areeg N, DO    Allergies as of 07/03/2022 - Review Complete 06/30/2022  Allergen Reaction Noted   Codeine Itching and Nausea And Vomiting    Latex Other (See Comments) 03/09/2012   Morphine Hives 09/14/2021   Zestril [lisinopril]  05/27/2017   Adhesive [tape] Rash  03/08/2012    Family History  Problem Relation Age of Onset   Stomach cancer Father    Lung cancer Father    Throat cancer Father    Cancer Father        lung and throat cancer   Hypertension Father    Liver disease Cousin        cirrhosis, etoh   Throat cancer Paternal Uncle    Lung cancer Paternal Uncle    Colon cancer Cousin 52   Heart disease Mother    Hypertension Mother    Colon cancer Maternal Uncle    Throat cancer Paternal Uncle    Lung cancer Paternal Uncle    Throat cancer Paternal Uncle    Lung cancer Paternal Uncle     Social History   Socioeconomic History   Marital status: Single    Spouse name: Not on file   Number of children: 0   Years of education: Not on file   Highest education level: Not on file  Occupational History   Not on file  Tobacco Use   Smoking status: Every Day    Packs/day: 1.00    Years: 37.00  Total pack years: 37.00    Types: Cigarettes    Passive exposure: Current   Smokeless tobacco: Former   Tobacco comments:    Trying to quit, has nicotine patches at home which have helped him quit before  Vaping Use   Vaping Use: Never used  Substance and Sexual Activity   Alcohol use: Not Currently   Drug use: Yes    Frequency: 2.0 times per week    Types: Marijuana    Comment: 07/27/22   Sexual activity: Not on file  Other Topics Concern   Not on file  Social History Narrative   Not on file   Social Determinants of Health   Financial Resource Strain: Not on file  Food Insecurity: Not on file  Transportation Needs: Not on file  Physical Activity: Not on file  Stress: Not on file  Social Connections: Not on file  Intimate Partner Violence: Not on file    Review of Systems: General: Negative for fever, chills, fatigue, weakness. Eyes: Negative for vision changes.  ENT: Negative for hoarseness, difficulty swallowing , nasal congestion. CV: Negative for chest pain, angina, palpitations, dyspnea on exertion, peripheral  edema.  Respiratory: Negative for dyspnea at rest, dyspnea on exertion, cough, sputum, wheezing.  GI: See history of present illness. GU:  Negative for dysuria, hematuria, urinary incontinence, urinary frequency, nocturnal urination.  MS: Negative for joint pain, low back pain.  Derm: Negative for rash or itching.  Neuro: Negative for weakness, abnormal sensation, seizure, frequent headaches, memory loss, confusion.  Psych: Negative for anxiety, depression Endo: Negative for unusual weight change.  Heme: Negative for bruising or bleeding. Allergy: Negative for rash or hives.  Physical Exam: Vital signs in last 24 hours: Temp:  [98.8 F (37.1 C)] 98.8 F (37.1 C) (11/27 1213) Pulse Rate:  [73] 73 (11/27 1213) Resp:  [16] 16 (11/27 1213) BP: (157)/(102) 157/102 (11/27 1213) SpO2:  [99 %] 99 % (11/27 1213)   General:   Alert,  Well-developed, well-nourished, pleasant and cooperative in NAD Head:  Normocephalic and atraumatic. Eyes:  Sclera clear, no icterus.   Conjunctiva pink. Ears:  Normal auditory acuity. Nose:  No deformity, discharge,  or lesions. Mouth:  No deformity or lesions, dentition normal. Neck:  Supple; no masses or thyromegaly. Lungs:  Clear throughout to auscultation.   No wheezes, crackles, or rhonchi. No acute distress. Heart:  Regular rate and rhythm; no murmurs, clicks, rubs,  or gallops. Abdomen:  Soft, nontender and nondistended. No masses, hepatosplenomegaly or hernias noted. Normal bowel sounds, without guarding, and without rebound.   Msk:  Symmetrical without gross deformities. Normal posture. Extremities:  Without clubbing or edema. Neurologic:  Alert and  oriented x4;  grossly normal neurologically. Skin:  Intact without significant lesions or rashes. Cervical Nodes:  No significant cervical adenopathy. Psych:  Alert and cooperative. Normal mood and affect.   Impression/Plan: [name] is here for an EGD with possible dilation due to chronic  dysphagia, abnormal esophageal manometry.  Risks, benefits, limitations, imponderables and alternatives regarding EGD have been reviewed with the patient. Questions have been answered. All parties agreeable.

## 2022-08-08 ENCOUNTER — Encounter (HOSPITAL_COMMUNITY): Payer: Self-pay | Admitting: Internal Medicine

## 2022-09-09 ENCOUNTER — Other Ambulatory Visit: Payer: Self-pay

## 2022-09-09 ENCOUNTER — Other Ambulatory Visit: Payer: Self-pay | Admitting: Student

## 2022-09-09 DIAGNOSIS — F1110 Opioid abuse, uncomplicated: Secondary | ICD-10-CM

## 2022-09-09 MED ORDER — BUPRENORPHINE HCL-NALOXONE HCL 8-2 MG SL FILM: ORAL_FILM | SUBLINGUAL | 0 refills | Status: DC

## 2022-09-09 NOTE — Telephone Encounter (Signed)
Pt states he has been without his medicine for 2 days and is having withdrawals.    Buprenorphine HCl-Naloxone HCl 8-2 MG FILM    [id number] - [place], [place] - [place]

## 2022-09-09 NOTE — Telephone Encounter (Signed)
Medication refilled/sighed today in another encounter.  This encounter closed

## 2022-09-23 ENCOUNTER — Other Ambulatory Visit: Payer: Self-pay | Admitting: Student

## 2022-09-23 DIAGNOSIS — G8929 Other chronic pain: Secondary | ICD-10-CM

## 2022-09-23 DIAGNOSIS — M25512 Pain in left shoulder: Secondary | ICD-10-CM

## 2022-10-02 ENCOUNTER — Other Ambulatory Visit: Payer: Self-pay

## 2022-10-02 DIAGNOSIS — F1110 Opioid abuse, uncomplicated: Secondary | ICD-10-CM

## 2022-10-16 MED ORDER — BUPRENORPHINE HCL-NALOXONE HCL 8-2 MG SL FILM: ORAL_FILM | SUBLINGUAL | 0 refills | Status: DC

## 2022-10-16 NOTE — Telephone Encounter (Signed)
Can we please call and schedule a follow up appointment asap for his OUD. Thanks

## 2022-10-30 ENCOUNTER — Ambulatory Visit: Payer: 59 | Admitting: Gastroenterology

## 2022-11-06 ENCOUNTER — Encounter: Payer: Self-pay | Admitting: Radiology

## 2022-11-18 ENCOUNTER — Encounter: Payer: Self-pay | Admitting: *Deleted

## 2022-11-24 ENCOUNTER — Ambulatory Visit: Payer: Commercial Managed Care - HMO | Admitting: Student

## 2022-11-24 VITALS — BP 111/84 | HR 81 | Wt 212.0 lb

## 2022-11-24 DIAGNOSIS — M79641 Pain in right hand: Secondary | ICD-10-CM

## 2022-11-24 DIAGNOSIS — G5603 Carpal tunnel syndrome, bilateral upper limbs: Secondary | ICD-10-CM | POA: Insufficient documentation

## 2022-11-24 DIAGNOSIS — M79642 Pain in left hand: Secondary | ICD-10-CM

## 2022-11-24 DIAGNOSIS — F1110 Opioid abuse, uncomplicated: Secondary | ICD-10-CM

## 2022-11-24 MED ORDER — BUPRENORPHINE HCL-NALOXONE HCL 8-2 MG SL FILM: ORAL_FILM | SUBLINGUAL | 2 refills | Status: DC

## 2022-11-24 NOTE — Patient Instructions (Signed)
  Thank you, [name], for allowing [place] to provide your care today. Today we discussed . . .  > Hand Pain       - you have carpal tunnel syndrome and probably some osteoarthritis as well       - we cannot provide you with a wrist splint today, but you should purchase one at your local pharmacy       - wear this brace at night while you sleep and schedule a visit if your pain persists  > Suboxone Therapy       - continue to take your Suboxone twice daily and try to stay consistent without missing any doses       - we are ordering a urine drug screen for today   I have ordered the following labs for you:  Lab Orders         ToxAssure Select,+Antidepr,UR       Tests ordered today:  none   Referrals ordered today:   Referral Orders  No referral(s) requested today      I have ordered the following medication/changed the following medications:   Stop the following medications: Medications Discontinued During This Encounter  Medication Reason   Buprenorphine HCl-Naloxone HCl 8-2 MG FILM Reorder     Start the following medications: Meds ordered this encounter  Medications   Buprenorphine HCl-Naloxone HCl 8-2 MG FILM    Sig: PLACE 1 STRIP UNDER THE TONGUE TWICE DAILY    Dispense:  60 each    Refill:  2      Follow up: 3 months    Remember:  Please try a firm wrist brace for carpal tunnel syndrome, which should help your symptoms. If your pain persists, then schedule a visit to see [place] for reevaluation. We can try medications in the future if necessary. Return to clinic in three months!   Should you have any questions or concerns please call the internal medicine clinic at [phone].     Jason Nickel, MD [place]

## 2022-11-24 NOTE — Progress Notes (Signed)
CC: Suboxone Visit  HPI:   [name] is a 57 y.o. male with a past medical history of hypertension, COPD, stroke, and depression who presents for Suboxone refill and hand pain.   Past Medical History:  Diagnosis Date   Acute pancreatitis    Anxiety    Arthritis    Colonic adenoma    COPD (chronic obstructive pulmonary disease) (HCC)    Depression    Diverticulitis of colon with perforation 08/2010   GERD (gastroesophageal reflux disease)    History of hiatal hernia    Hypertension    Polysubstance abuse ([place])    Renal insufficiency    Sleep apnea    Intolerant of CPAP   Stroke ([place])    TIA (transient ischemic attack)      Review of Systems:    Reports hand pain, tingling, numbness, episode of abdominal pain and nausea Denies fever, recent illnesses    Physical Exam:  Vitals:   11/24/22 1530  BP: 111/84  Pulse: 81  SpO2: 100%  Weight: 212 lb (96.2 kg)    General:   awake and alert, sitting comfortably in chair, cooperative, not in acute distress Skin:   warm and dry, intact without any obvious lesions or scars, no rashes Head:   normocephalic and atraumatic, oral mucosa moist with good dentition, no lymphadenopathy Eyes:   extraocular movements intact, conjunctivae pink, pupils round and reactive to light, no periorbital swelling or scleral icterus Ears:   pinnae normal, no discharge or external lesions  Nose:   symmetrical and without mucosal inflammation, no external lesions or discharge Lungs:   normal respiratory effort, breathing unlabored, symmetrical chest rise, no crackles or wheezing Cardiac:   regular rate and rhythm, normal S1 and S2, capillary refill 2-3 seconds, dorsalis pedis pulses intact bilaterally, no pitting edema Abdomen:   soft and non-distended, normoactive bowel sounds present in all four quadrants, no guarding or palpable masses Musculoskeletal:   slightly enlarged PIP and DIP joints bilaterally, decreased sensation in C7-8  distribution on L hand, interossei strength full bilaterally, decreased in wrist extension and thumb abduction bilaterally, Tinel and Phalen test positive bilaterally, significant tenderness to palpation over all distal BUE joints Neurologic:   oriented to person-place-time Psychiatric:   euthymic mood with congruent affect, intelligible speech    Assessment & Plan:   Opioid use disorder, mild, abuse ([place]) Patient has been using Suboxone for management of chronic pain since 08-2020. Most recent urine drugs screen performed 06-2022 positive for expected alprazolam and buprenorphine plus unexpected tetrahydrocannabinol, tramadol, and duloxetine. Last prescription for buprenorphine-naloxone 8-2mg  was on 2-8 and he was given thirty-day supply. Takes two films takes each day, sometimes skips a dose to prevent insomnia at night. This occurs roughly 1-2x per week on average. One week ago, he felt sick and skipped about 4-5 consecutive doses. At that time, he experienced withdrawal symptoms including abdominal pain and diaphoresis. He has about 2-3 films left today and is requesting a refill. Review of PDMP demonstrated zero inappropriate dispenses.  - Continue buprenorphine-naloxone 8-2mg , provided 30-day supply with two refills - Urine drug screen today, marijuana and alprazolam expected - Return to [facility] in about three months    Carpal tunnel syndrome on both sides Patient reports bilateral L>R hand pain over the last several years. Describes the pain as off-and-on throughout this time period. Associated with numbness and tingling. Pain is achy, burning, and sharp like needles. Worse within the joints and in the morning after awakening. Only  occasionally will experience radiation from his neck into arms. Also has cramping in L thumb since CVA in 2021. Alleviated by Aspercreme, duloxetine and gabapentin also helped but these caused bothersome mental fogginess so patient self-discontinued. 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 Exam notable for decreased sensation in C7-8 distribution on L hand. Interossei strength full bilaterally, decreased in wrist extension and thumb abduction. Tinel and Phalen test were both positive bilaterally. Significant tenderness to palpation over all distal BUE joints including DIPs, PIPs, MCPs, and wrist. Etiology likely multifactorial including peripheral neuropathy, cervical radiculopathy, hand osteoarthritis, and carpal tunnel syndrome. Latter of which seems the most bothersome to him at this time.  - Encourage wearing firm carpal tunnel wrist splint at night - Bilateral hand radiograph to evaluate for osteoarthritis - Consider pharmacologic therapy if symptoms persist      See Encounters Tab for problem based charting.  Patient discussed with Dr. Daryll Drown

## 2022-11-24 NOTE — Assessment & Plan Note (Addendum)
Patient has been using Suboxone for management of chronic pain since 08-2020. Most recent urine drugs screen performed 06-2022 positive for expected alprazolam and buprenorphine plus unexpected tetrahydrocannabinol, tramadol, and duloxetine. Last prescription for buprenorphine-naloxone 8-2mg  was on 2-8 and he was given thirty-day supply. Takes two films takes each day, sometimes skips a dose to prevent insomnia at night. This occurs roughly 1-2x per week on average. One week ago, he felt sick and skipped about 4-5 consecutive doses. At that time, he experienced withdrawal symptoms including abdominal pain and diaphoresis. He has about 2-3 films left today and is requesting a refill. Review of PDMP demonstrated zero inappropriate dispenses.  - Continue buprenorphine-naloxone 8-2mg , provided 30-day supply with two refills - Urine drug screen today, marijuana and alprazolam expected - Return to [facility] in about three months

## 2022-11-24 NOTE — Assessment & Plan Note (Addendum)
Patient reports bilateral L>R hand pain over the last several years. Describes the pain as off-and-on throughout this time period. Associated with numbness and tingling. Pain is achy, burning, and sharp like needles. Worse within the joints and in the morning after awakening. Only occasionally will experience radiation from his neck into arms. Also has cramping in L thumb since CVA in 2021. Alleviated by Aspercreme, duloxetine and gabapentin also helped but these caused bothersome mental fogginess so patient self-discontinued. 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 Exam notable for decreased sensation in C7-8 distribution on L hand. Interossei strength full bilaterally, decreased in wrist extension and thumb abduction. Tinel and Phalen test were both positive bilaterally. Significant tenderness to palpation over all distal BUE joints including DIPs, PIPs, MCPs, and wrist. Etiology likely multifactorial including peripheral neuropathy, cervical radiculopathy, hand osteoarthritis, and carpal tunnel syndrome. Latter of which seems the most bothersome to him at this time.  - Encourage wearing firm carpal tunnel wrist splint at night - Bilateral hand radiograph to evaluate for osteoarthritis - Consider pharmacologic therapy if symptoms persist

## 2022-11-25 ENCOUNTER — Encounter: Payer: 59 | Admitting: Student

## 2022-11-26 NOTE — Progress Notes (Signed)
Internal Medicine Clinic Attending  Case discussed with Dr. Harper  at the time of the visit.  We reviewed the resident's history and exam and pertinent patient test results.  I agree with the assessment, diagnosis, and plan of care documented in the resident's note.  

## 2022-11-27 LAB — TOXASSURE SELECT,+ANTIDEPR,UR

## 2022-11-27 NOTE — Progress Notes (Signed)
Referring Provider: Linward Natal, MD Primary Care Physician:  Linward Natal, MD Primary GI Physician: Dr.  Gala Romney  Chief Complaint  Patient presents with   Colonoscopy    Colonoscopy screening. Has been having blood in stool and has hemorrhoids.  Hernia scar is bothering him.     HPI:   [name] is a 57 y.o. male with history of GERD, hiatal hernia, dysphagia, hemorrhoids, colectomy with end colostomy later reversed, adenomatous colon polyps, anal fissure, acute pancreatitis, chronic abdominal pain, diverticulitis with perforation, HTN, polysubstance abuse, presenting today to discuss scheduling surveillance colonoscopy and with complaint of rectal bleeding.   Last colonoscopy April 2019 with two 4-6 mm polyps in the cecum, tubular adenomas, single rectal polyp, tubular adenoma, nonbleeding internal hemorrhoids.  Appears to be a surgical clip at the appendiceal orifice.  Recommended repeat colonoscopy in 5 years.  Last seen in our office 05/28/2022 for dysphagia.  Prior EGD in September 2022 without obvious esophageal stricture s/p empiric dilation with mild improvement in symptoms.  He later underwent esophageal manometry in April 2023 due to ongoing dysphagia that showed normal peristalsis, concern for EGJ outflow obstruction.  Recommended repeat EGD with dilation  EGD completed 08/04/2022 revealing 3 cm hiatal hernia, mild Schatzki's ring dilated, normal stomach and examined duodenum.  Today: Rectal bleeding:  Occasional. For the last year. In stool and tissue. Has hemorrhoids. Intermittent rectal pain when has bleeding. Uses Hydrocortisone PRN which helps. Suboxone causes some constipation. Bms once a day but hard stools. Doesn't take anything for constipation.   GERD:  Well controlled on Pantoprazole BID. Some swallowing trouble, but not as bad No regurgitation.   Sometimes has a black stool. Once a week or so. For 1 year or so. No NSAIDs. RUQ abdominal pain x5 months.  Worse with reaching or twisting. No association with meals.   No nausea or vomiting. No pain except at his scar. Feels the mesh is poking out. Chronic but worsening.   Refer to general surgery.   Past Medical History:  Diagnosis Date   Acute pancreatitis    Anxiety    Arthritis    Colonic adenoma    COPD (chronic obstructive pulmonary disease) (HCC)    Depression    Diverticulitis of colon with perforation 08/2010   GERD (gastroesophageal reflux disease)    History of hiatal hernia    Hypertension    Polysubstance abuse ([place])    Renal insufficiency    Sleep apnea    Intolerant of CPAP   Stroke ([place])    TIA (transient ischemic attack)     Past Surgical History:  Procedure Laterality Date   APPENDECTOMY     BALLOON DILATION N/A 08/04/2022   Procedure: BALLOON DILATION;  Surgeon: Eloise Harman, DO;  [facility];  Service: Endoscopy;  Laterality: N/A;   BIOPSY  12/14/2017   Procedure: BIOPSY;  Surgeon: Daneil Dolin, MD;  [facility];  Service: Endoscopy;;  gastric    BIOPSY  10/13/2019   Procedure: BIOPSY;  Surgeon: Daneil Dolin, MD;  [facility];  Service: Endoscopy;;  esophagus   BIOPSY  05/20/2021   Procedure: BIOPSY;  Surgeon: Daneil Dolin, MD;  [facility];  Service: Endoscopy;;   clamp left in colon after abd surgery     COLON SURGERY  05/2011   [facility]. exp laparotomy with colostomy takedown, lysis of adhesions (3hours), loop ileostomy. complicated by abscess formation requiring percutaneous drainage  colonoscopy via stoma with endoscopy of [name]  04/2011   [facility]. two sessile polyps in trv colon. complete resection but only partial retrieval. congested mucosa in area 3cm proximal to stoma, erythematous mucosa in Hartmann pouch. repeat tcs 8/2017path-adenomatous polyp   COLONOSCOPY WITH PROPOFOL N/A 12/14/2017   Dr. Gala Romney: 2 4 to 6 mm polyps in the cecum, tubular adenomas, single rectal polyp tubular  adenoma, nonbleeding internal hemorrhoids.  Appears to be a surgical clip at the appendiceal orifice.  Next colonoscopy in 5 years   ESOPHAGEAL MANOMETRY N/A 12/11/2021   Procedure: ESOPHAGEAL MANOMETRY (EM);  Surgeon: Mauri Pole, MD;  [facility]: [facility];  Service: Endoscopy;  Laterality: N/A;   ESOPHAGOGASTRODUODENOSCOPY  03/05/2012   erosive reflux esophagitis, bulbar erosions, gastric erosions/petichae, bx showed gastritis, no H.Pylori. Procedure: ESOPHAGOGASTRODUODENOSCOPY (EGD);  Surgeon: Daneil Dolin, MD;  [facility];  Service: Endoscopy;  Laterality: N/A;  11:30   ESOPHAGOGASTRODUODENOSCOPY (EGD) WITH PROPOFOL N/A 12/14/2017   Dr. Gala Romney: Erosive reflux esophagitis with questionable mild stricturing/non-obstruction at this level status post dilation.  multiple gastric erosions in the stomach biopsy showed reactive gastropathy but no H. pylori.   ESOPHAGOGASTRODUODENOSCOPY (EGD) WITH PROPOFOL N/A 10/13/2019   negative Barrett's, normal stomach   ESOPHAGOGASTRODUODENOSCOPY (EGD) WITH PROPOFOL N/A 05/20/2021   inlet patch with superimposed nodule s/p biopsy. Normal esophagus s/p dilation. Normal gastric mucosa. Normal duodenum. Reflux changes on path.   ESOPHAGOGASTRODUODENOSCOPY (EGD) WITH PROPOFOL N/A 08/04/2022   Procedure: ESOPHAGOGASTRODUODENOSCOPY (EGD) WITH PROPOFOL;  Surgeon: Eloise Harman, DO;  [facility];  Service: Endoscopy;  Laterality: N/A;  1:15 pm   HERNIA REPAIR     KNEE SURGERY Right 2006   MALONEY DILATION N/A 12/14/2017   Procedure: Venia Minks DILATION;  Surgeon: Daneil Dolin, MD;  [facility];  Service: Endoscopy;  Laterality: N/A;   MALONEY DILATION  05/20/2021   Procedure: Venia Minks DILATION;  Surgeon: Daneil Dolin, MD;  [facility];  Service: Endoscopy;;   MULTIPLE EXTRACTIONS WITH ALVEOLOPLASTY  02/23/2012   Procedure: MULTIPLE EXTRACION WITH ALVEOLOPLASTY;  Surgeon: Gae Bon, DDS;  Location: [place];  Service: Oral Surgery;  Laterality: Bilateral;  Removal of left mandibular torus, multiple extractions with alveoloplasty   POLYPECTOMY  12/14/2017   Procedure: POLYPECTOMY;  Surgeon: Daneil Dolin, MD;  [facility];  Service: Endoscopy;;   reversal of colostomy  10/2011   per patient, did not have report   right knee surgery     sigmoid colectomy with end colostomy, Hartmann's pouch  08/2010   [facility]. complicated diverticulitis   UMBILICAL HERNIA REPAIR      Current Outpatient Medications  Medication Sig Dispense Refill   albuterol (PROVENTIL HFA;VENTOLIN HFA) 108 (90 Base) MCG/ACT inhaler Inhale 2 puffs into the lungs every 6 (six) hours as needed for wheezing or shortness of breath.     Buprenorphine HCl-Naloxone HCl 8-2 MG FILM PLACE 1 STRIP UNDER THE TONGUE TWICE DAILY 60 each 2   diclofenac Sodium (VOLTAREN) 1 % GEL APPLY 2 GRAMS  TOPICALLY 4 TIMES DAILY (Patient taking differently: Apply 2 g topically 4 (four) times daily as needed (shoulder pain.).) 200 g 0   hydrochlorothiazide (HYDRODIURIL) 25 MG tablet Take 1 tablet (25 mg total) by mouth daily. 90 tablet 3   hydrocortisone (ANUSOL-HC) 2.5 % rectal cream Place 1 Application rectally 2 (two) times daily as needed for hemorrhoids. 28 g 2   pantoprazole (PROTONIX) 40 MG tablet Take 1  tablet (40 mg total) by mouth 2 (two) times daily. (Patient taking differently: Take 40 mg by mouth daily.) 180 tablet 3   sildenafil (VIAGRA) 100 MG tablet Take 1 tablet (100 mg total) by mouth as needed for erectile dysfunction. 20 tablet 1   spironolactone (ALDACTONE) 50 MG tablet Take 1 tablet (50 mg total) by mouth daily. 30 tablet 11   No current facility-administered medications for this visit.    Allergies as of 11/28/2022 - Review Complete 11/28/2022  Allergen Reaction Noted   Codeine Itching and Nausea And Vomiting    Latex Other (See Comments) 03/09/2012   Morphine Hives 09/14/2021   Zestril [lisinopril]  05/27/2017    Adhesive [tape] Rash 03/08/2012    Family History  Problem Relation Age of Onset   Stomach cancer Father    Lung cancer Father    Throat cancer Father    Cancer Father        lung and throat cancer   Hypertension Father    Liver disease Cousin        cirrhosis, etoh   Throat cancer Paternal Uncle    Lung cancer Paternal Uncle    Colon cancer Cousin 33   Heart disease Mother    Hypertension Mother    Colon cancer Maternal Uncle    Throat cancer Paternal Uncle    Lung cancer Paternal Uncle    Throat cancer Paternal Uncle    Lung cancer Paternal Uncle     Social History   Socioeconomic History   Marital status: Single    Spouse name: Not on file   Number of children: 0   Years of education: Not on file   Highest education level: Not on file  Occupational History   Not on file  Tobacco Use   Smoking status: Every Day    Packs/day: 1.00    Years: 37.00    Additional pack years: 0.00    Total pack years: 37.00    Types: Cigarettes    Passive exposure: Current   Smokeless tobacco: Former   Tobacco comments:    Trying to quit, has nicotine patches at home which have helped him quit before  Vaping Use   Vaping Use: Never used  Substance and Sexual Activity   Alcohol use: Not Currently   Drug use: Yes    Frequency: 2.0 times per week    Types: Marijuana    Comment: 07/27/22   Sexual activity: Not on file  Other Topics Concern   Not on file  Social History Narrative   Not on file   Social Determinants of Health   Financial Resource Strain: High Risk (11/24/2022)   Overall Financial Resource Strain (CARDIA)    Difficulty of Paying Living Expenses: Hard  Food Insecurity: Food Insecurity Present (11/24/2022)   Hunger Vital Sign    Worried About Charity fundraiser in the Last Year: Often true    Ran Out of Food in the Last Year: Often true  Transportation Needs: Not on file  Physical Activity: Not on file  Stress: Not on file  Social Connections: Not on file     Review of Systems: Gen: Denies fever, chills, cold or flulike symptoms, presyncope, syncope. CV: Denies chest pain, palpitations. Resp: Denies dyspnea, cough.  GI: See HPI Heme: See HPI  Physical Exam: BP 138/83 (BP Location: Right Arm, Patient Position: Sitting, Cuff Size: Normal)   Pulse 79   Temp (!) 97.5 F (36.4 C) (Temporal)   Ht 5\' 7"  (  1.702 m)   Wt 207 lb (93.9 kg)   SpO2 95%   BMI 32.42 kg/m  General:   Alert and oriented. No distress noted. Pleasant and cooperative.  Head:  Normocephalic and atraumatic. Eyes:  Conjuctiva clear without scleral icterus. Heart:  S1, S2 present without murmurs appreciated. Lungs:  Clear to auscultation bilaterally. No wheezes, rales, or rhonchi. No distress.  Abdomen:  +BS, soft, non-distended. Mild to moderate TTP in RUQ. Tenderness around the umbilicus with suspected palpable mesh. No rebound or guarding. Vertical midline scar. Scar in RLQ from prior ostomy. No HSM or masses noted. Msk:  Symmetrical without gross deformities. Normal posture. Extremities:  Without edema. Neurologic:  Alert and  oriented x4 Psych:  Normal mood and affect.    Assessment:   57 y.o. male with history of GERD, hiatal hernia, dysphagia, hemorrhoids, colectomy with end colostomy later reversed, adenomatous colon polyps, anal fissure, acute pancreatitis, chronic abdominal pain, diverticulitis with perforation, HTN, polysubstance abuse, presenting today to discuss scheduling surveillance colonoscopy but reporting several GI concerns including rectal bleeding, back stool, abdominal pain, dysphagia.   Rectal bleeding:  Intermittent toilet tissue hematochezia or bright red blood in toilet water for the last year.  Has not history of hemorrhoids and bleeding occurs when he has associated rectal burning/pain related to his hemorrhoids.  Improves with Anusol rectal cream.  No unintentional weight loss.  He does have constipation which I suspect is contributing to his  intermittent hemorrhoid flare/bleeding.  However, unable to rule out colon polyps or malignancy.  He is currently due for surveillance colonoscopy.  Will update labs, add Benefiber to help with bowel regularity as he is quite sensitive to over-the-counter medications, continue using Anusol rectal cream, and arrange colonoscopy in the near future.  Constipation:  Chronic.  Likely secondary to Suboxone.  Quite sensitive to over-the-counter medications reporting 1 stool softener will cause him to have bowel movements all day long.  Will have him add Benefiber daily to see if this helps with bowel regularity.  History of colon polyps: Due for surveillance colonoscopy.  Last colonoscopy in April 2019 with 3 tubular adenomas removed, nonbleeding internal hemorrhoids.  Recommended surveillance in 5 years.  Black stools:  Intermittent black stools for the last several months.  Unclear if this is true melena.  Denies iron or Pepto-Bismol.  Chronically on PPI twice daily.  EGD in November 2023 with 3 cm hiatal hernia, mild Schatzki's ring dilated, normal stomach and examined duodenum.  He has also noticed some intermittent right upper quadrant abdominal pain for the last several months though this sounds more musculoskeletal in etiology; however, unable to rule out gastritis, duodenitis, PUD.  Will plan to update labs and arrange EGD to reevaluate his upper GI tract.  GERD:  Chronic.  Well-controlled on PPI twice daily.  Dysphagia:  Solid food and occasional liquid dysphagia.  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  Follow-up EGD November 2023 with 3 cm hiatal hernia, mild Schatzki's ring dilated.  He had some improvement in his symptoms, but continues to have daily dysphagia.  Unclear if this is related to persistent Schatzki's ring, hiatal hernia, or esophageal dysmotility.  We are proceeding with an EGD due to black stools as discussed above.  Will  reevaluate his esophagus at that time.  If no obvious abnormality to explain persistent dysphagia, would consider barium pill esophagram.  RUQ abdominal pain:  Intermittent RUQ abdominal pain for the last few months.  Seems most  consistent with MSK etiology as symptoms occur with certain movements.  Denies association with meals, bowel movements, or associated nausea/vomiting.  Doubt biliary etiology, but gallbladder is in situ.  He has also reported some intermittent black stools over the last few months, thus unable to rule out gastritis, duodenitis, PUD though this seems less likely.  He is chronically on PPI twice daily and denies NSAIDs.  We are arranging RUQ ultrasound, labs, and proceeding with an EGD in the near future due to reported black stools.  Periumbilical pain:  Worsening periumbilical abdominal pain at the site of prior umbilical hernia repair with mesh.  No obvious recurrent hernia on exam but he does have some what I suspect are edges of mesh poking up under the skin that is tender to palpation.  Patient would like to see general surgery to discuss if there are any options for him.    Plan:  CBC, CMP RUQ [place] Proceed with colonoscopy + EGD +/- dilation with propofol by Dr. Gala Romney in near future. The risks, benefits, and alternatives have been discussed with the patient in detail. The patient states understanding and desires to proceed.  ASA 3 Linzess 145 mcg daily x 4 days before starting colon prep.  Continue pantoprazole 40 mg twice daily. Avoid NSAIDs. Swallowing precautions discussed.  Separate instructions provided on AVS. Start Benefiber 2 teaspoons daily x 2 weeks, then increase to twice daily. Continue Anusol rectal cream for hemorrhoids as needed. Limit toilet time to 2-3 minutes. Avoid straining. Refer to general surgery for periumbilical pain/history of umbilical hernia repair with mesh. With after procedures.   Aliene Altes, PA-C [facility]  Gastroenterology 11/28/2022

## 2022-11-27 NOTE — H&P (View-Only) (Signed)
  Referring Provider: White, Jonathan, MD Primary Care Physician:  White, Jonathan, MD Primary GI Physician: Dr.  Rourk  Chief Complaint  Patient presents with   Colonoscopy    Colonoscopy screening. Has been having blood in stool and has hemorrhoids.  Hernia scar is bothering him.     HPI:   Zadyn F Lichty is a 56 y.o. male with history of GERD, hiatal hernia, dysphagia, hemorrhoids, colectomy with end colostomy later reversed, adenomatous colon polyps, anal fissure, acute pancreatitis, chronic abdominal pain, diverticulitis with perforation, HTN, polysubstance abuse, presenting today to discuss scheduling surveillance colonoscopy and with complaint of rectal bleeding.   Last colonoscopy April 2019 with two 4-6 mm polyps in the cecum, tubular adenomas, single rectal polyp, tubular adenoma, nonbleeding internal hemorrhoids.  Appears to be a surgical clip at the appendiceal orifice.  Recommended repeat colonoscopy in 5 years.  Last seen in our office 05/28/2022 for dysphagia.  Prior EGD in September 2022 without obvious esophageal stricture s/p empiric dilation with mild improvement in symptoms.  He later underwent esophageal manometry in April 2023 due to ongoing dysphagia that showed normal peristalsis, concern for EGJ outflow obstruction.  Recommended repeat EGD with dilation  EGD completed 08/04/2022 revealing 3 cm hiatal hernia, mild Schatzki's ring dilated, normal stomach and examined duodenum.  Today: Rectal bleeding:  Occasional. For the last year. In stool and tissue. Has hemorrhoids. Intermittent rectal pain when has bleeding. Uses Hydrocortisone PRN which helps. Suboxone causes some constipation. Bms once a day but hard stools. Doesn't take anything for constipation.   GERD:  Well controlled on Pantoprazole BID. Some swallowing trouble, but not as bad No regurgitation.   Sometimes has a black stool. Once a week or so. For 1 year or so. No NSAIDs. RUQ abdominal pain x5 months.  Worse with reaching or twisting. No association with meals.   No nausea or vomiting. No pain except at his scar. Feels the mesh is poking out. Chronic but worsening.   Refer to general surgery.   Past Medical History:  Diagnosis Date   Acute pancreatitis    Anxiety    Arthritis    Colonic adenoma    COPD (chronic obstructive pulmonary disease) (HCC)    Depression    Diverticulitis of colon with perforation 08/2010   GERD (gastroesophageal reflux disease)    History of hiatal hernia    Hypertension    Polysubstance abuse (HCC)    Renal insufficiency    Sleep apnea    Intolerant of CPAP   Stroke (HCC)    TIA (transient ischemic attack)     Past Surgical History:  Procedure Laterality Date   APPENDECTOMY     BALLOON DILATION N/A 08/04/2022   Procedure: BALLOON DILATION;  Surgeon: Carver, Charles K, DO;  [facility];  Service: Endoscopy;  Laterality: N/A;   BIOPSY  12/14/2017   Procedure: BIOPSY;  Surgeon: Rourk, Arlind M, MD;  [facility];  Service: Endoscopy;;  gastric    BIOPSY  10/13/2019   Procedure: BIOPSY;  Surgeon: Rourk, Avinash M, MD;  [facility];  Service: Endoscopy;;  esophagus   BIOPSY  05/20/2021   Procedure: BIOPSY;  Surgeon: Rourk, Hilario M, MD;  [facility];  Service: Endoscopy;;   clamp left in colon after abd surgery     COLON SURGERY  05/2011   [facility]. exp laparotomy with colostomy takedown, lysis of adhesions (3hours), loop ileostomy. complicated by abscess formation requiring percutaneous drainage     colonoscopy via stoma with endoscopy of Hartmann Pouch  04/2011   [facility]. two sessile polyps in trv colon. complete resection but only partial retrieval. congested mucosa in area 3cm proximal to stoma, erythematous mucosa in Hartmann pouch. repeat tcs 8/2017path-adenomatous polyp   COLONOSCOPY WITH PROPOFOL N/A 12/14/2017   Dr. Rourk: 2 4 to 6 mm polyps in the cecum, tubular adenomas, single rectal polyp tubular  adenoma, nonbleeding internal hemorrhoids.  Appears to be a surgical clip at the appendiceal orifice.  Next colonoscopy in 5 years   ESOPHAGEAL MANOMETRY N/A 12/11/2021   Procedure: ESOPHAGEAL MANOMETRY (EM);  Surgeon: Nandigam, Kavitha V, MD;  Location: [facility];  Service: Endoscopy;  Laterality: N/A;   ESOPHAGOGASTRODUODENOSCOPY  03/05/2012   erosive reflux esophagitis, bulbar erosions, gastric erosions/petichae, bx showed gastritis, no H.Pylori. Procedure: ESOPHAGOGASTRODUODENOSCOPY (EGD);  Surgeon: Jadyn M Rourk, MD;  [facility];  Service: Endoscopy;  Laterality: N/A;  11:30   ESOPHAGOGASTRODUODENOSCOPY (EGD) WITH PROPOFOL N/A 12/14/2017   Dr. Rourk: Erosive reflux esophagitis with questionable mild stricturing/non-obstruction at this level status post dilation.  multiple gastric erosions in the stomach biopsy showed reactive gastropathy but no H. pylori.   ESOPHAGOGASTRODUODENOSCOPY (EGD) WITH PROPOFOL N/A 10/13/2019   negative Barrett's, normal stomach   ESOPHAGOGASTRODUODENOSCOPY (EGD) WITH PROPOFOL N/A 05/20/2021   inlet patch with superimposed nodule s/p biopsy. Normal esophagus s/p dilation. Normal gastric mucosa. Normal duodenum. Reflux changes on path.   ESOPHAGOGASTRODUODENOSCOPY (EGD) WITH PROPOFOL N/A 08/04/2022   Procedure: ESOPHAGOGASTRODUODENOSCOPY (EGD) WITH PROPOFOL;  Surgeon: Carver, Charles K, DO;  [facility];  Service: Endoscopy;  Laterality: N/A;  1:15 pm   HERNIA REPAIR     KNEE SURGERY Right 2006   MALONEY DILATION N/A 12/14/2017   Procedure: MALONEY DILATION;  Surgeon: Rourk, Diontay M, MD;  [facility];  Service: Endoscopy;  Laterality: N/A;   MALONEY DILATION  05/20/2021   Procedure: MALONEY DILATION;  Surgeon: Rourk, Tyquavious M, MD;  [facility];  Service: Endoscopy;;   MULTIPLE EXTRACTIONS WITH ALVEOLOPLASTY  02/23/2012   Procedure: MULTIPLE EXTRACION WITH ALVEOLOPLASTY;  Surgeon: Scott M Jensen, DDS;  Location: [facility];  Service: Oral Surgery;  Laterality: Bilateral;  Removal of left mandibular torus, multiple extractions with alveoloplasty   POLYPECTOMY  12/14/2017   Procedure: POLYPECTOMY;  Surgeon: Rourk, Sims M, MD;  [facility];  Service: Endoscopy;;   reversal of colostomy  10/2011   per patient, did not have report   right knee surgery     sigmoid colectomy with end colostomy, Hartmann's pouch  08/2010   [facility]. complicated diverticulitis   UMBILICAL HERNIA REPAIR      Current Outpatient Medications  Medication Sig Dispense Refill   albuterol (PROVENTIL HFA;VENTOLIN HFA) 108 (90 Base) MCG/ACT inhaler Inhale 2 puffs into the lungs every 6 (six) hours as needed for wheezing or shortness of breath.     Buprenorphine HCl-Naloxone HCl 8-2 MG FILM PLACE 1 STRIP UNDER THE TONGUE TWICE DAILY 60 each 2   diclofenac Sodium (VOLTAREN) 1 % GEL APPLY 2 GRAMS  TOPICALLY 4 TIMES DAILY (Patient taking differently: Apply 2 g topically 4 (four) times daily as needed (shoulder pain.).) 200 g 0   hydrochlorothiazide (HYDRODIURIL) 25 MG tablet Take 1 tablet (25 mg total) by mouth daily. 90 tablet 3   hydrocortisone (ANUSOL-HC) 2.5 % rectal cream Place 1 Application rectally 2 (two) times daily as needed for hemorrhoids. 28 g 2   pantoprazole (PROTONIX) 40 MG tablet Take 1   tablet (40 mg total) by mouth 2 (two) times daily. (Patient taking differently: Take 40 mg by mouth daily.) 180 tablet 3   sildenafil (VIAGRA) 100 MG tablet Take 1 tablet (100 mg total) by mouth as needed for erectile dysfunction. 20 tablet 1   spironolactone (ALDACTONE) 50 MG tablet Take 1 tablet (50 mg total) by mouth daily. 30 tablet 11   No current facility-administered medications for this visit.    Allergies as of 11/28/2022 - Review Complete 11/28/2022  Allergen Reaction Noted   Codeine Itching and Nausea And Vomiting    Latex Other (See Comments) 03/09/2012   Morphine Hives 09/14/2021   Zestril [lisinopril]  05/27/2017    Adhesive [tape] Rash 03/08/2012    Family History  Problem Relation Age of Onset   Stomach cancer Father    Lung cancer Father    Throat cancer Father    Cancer Father        lung and throat cancer   Hypertension Father    Liver disease Cousin        cirrhosis, etoh   Throat cancer Paternal Uncle    Lung cancer Paternal Uncle    Colon cancer Cousin 52   Heart disease Mother    Hypertension Mother    Colon cancer Maternal Uncle    Throat cancer Paternal Uncle    Lung cancer Paternal Uncle    Throat cancer Paternal Uncle    Lung cancer Paternal Uncle     Social History   Socioeconomic History   Marital status: Single    Spouse name: Not on file   Number of children: 0   Years of education: Not on file   Highest education level: Not on file  Occupational History   Not on file  Tobacco Use   Smoking status: Every Day    Packs/day: 1.00    Years: 37.00    Additional pack years: 0.00    Total pack years: 37.00    Types: Cigarettes    Passive exposure: Current   Smokeless tobacco: Former   Tobacco comments:    Trying to quit, has nicotine patches at home which have helped him quit before  Vaping Use   Vaping Use: Never used  Substance and Sexual Activity   Alcohol use: Not Currently   Drug use: Yes    Frequency: 2.0 times per week    Types: Marijuana    Comment: 07/27/22   Sexual activity: Not on file  Other Topics Concern   Not on file  Social History Narrative   Not on file   Social Determinants of Health   Financial Resource Strain: High Risk (11/24/2022)   Overall Financial Resource Strain (CARDIA)    Difficulty of Paying Living Expenses: Hard  Food Insecurity: Food Insecurity Present (11/24/2022)   Hunger Vital Sign    Worried About Running Out of Food in the Last Year: Often true    Ran Out of Food in the Last Year: Often true  Transportation Needs: Not on file  Physical Activity: Not on file  Stress: Not on file  Social Connections: Not on file     Review of Systems: Gen: Denies fever, chills, cold or flulike symptoms, presyncope, syncope. CV: Denies chest pain, palpitations. Resp: Denies dyspnea, cough.  GI: See HPI Heme: See HPI  Physical Exam: BP 138/83 (BP Location: Right Arm, Patient Position: Sitting, Cuff Size: Normal)   Pulse 79   Temp (!) 97.5 F (36.4 C) (Temporal)   Ht 5' 7" (  1.702 m)   Wt 207 lb (93.9 kg)   SpO2 95%   BMI 32.42 kg/m  General:   Alert and oriented. No distress noted. Pleasant and cooperative.  Head:  Normocephalic and atraumatic. Eyes:  Conjuctiva clear without scleral icterus. Heart:  S1, S2 present without murmurs appreciated. Lungs:  Clear to auscultation bilaterally. No wheezes, rales, or rhonchi. No distress.  Abdomen:  +BS, soft, non-distended. Mild to moderate TTP in RUQ. Tenderness around the umbilicus with suspected palpable mesh. No rebound or guarding. Vertical midline scar. Scar in RLQ from prior ostomy. No HSM or masses noted. Msk:  Symmetrical without gross deformities. Normal posture. Extremities:  Without edema. Neurologic:  Alert and  oriented x4 Psych:  Normal mood and affect.    Assessment:   56 y.o. male with history of GERD, hiatal hernia, dysphagia, hemorrhoids, colectomy with end colostomy later reversed, adenomatous colon polyps, anal fissure, acute pancreatitis, chronic abdominal pain, diverticulitis with perforation, HTN, polysubstance abuse, presenting today to discuss scheduling surveillance colonoscopy but reporting several GI concerns including rectal bleeding, back stool, abdominal pain, dysphagia.   Rectal bleeding:  Intermittent toilet tissue hematochezia or bright red blood in toilet water for the last year.  Has not history of hemorrhoids and bleeding occurs when he has associated rectal burning/pain related to his hemorrhoids.  Improves with Anusol rectal cream.  No unintentional weight loss.  He does have constipation which I suspect is contributing to his  intermittent hemorrhoid flare/bleeding.  However, unable to rule out colon polyps or malignancy.  He is currently due for surveillance colonoscopy.  Will update labs, add Benefiber to help with bowel regularity as he is quite sensitive to over-the-counter medications, continue using Anusol rectal cream, and arrange colonoscopy in the near future.  Constipation:  Chronic.  Likely secondary to Suboxone.  Quite sensitive to over-the-counter medications reporting 1 stool softener will cause him to have bowel movements all day long.  Will have him add Benefiber daily to see if this helps with bowel regularity.  History of colon polyps: Due for surveillance colonoscopy.  Last colonoscopy in April 2019 with 3 tubular adenomas removed, nonbleeding internal hemorrhoids.  Recommended surveillance in 5 years.  Black stools:  Intermittent black stools for the last several months.  Unclear if this is true melena.  Denies iron or Pepto-Bismol.  Chronically on PPI twice daily.  EGD in November 2023 with 3 cm hiatal hernia, mild Schatzki's ring dilated, normal stomach and examined duodenum.  He has also noticed some intermittent right upper quadrant abdominal pain for the last several months though this sounds more musculoskeletal in etiology; however, unable to rule out gastritis, duodenitis, PUD.  Will plan to update labs and arrange EGD to reevaluate his upper GI tract.  GERD:  Chronic.  Well-controlled on PPI twice daily.  Dysphagia:  Solid food and occasional liquid dysphagia.  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  Follow-up EGD November 2023 with 3 cm hiatal hernia, mild Schatzki's ring dilated.  He had some improvement in his symptoms, but continues to have daily dysphagia.  Unclear if this is related to persistent Schatzki's ring, hiatal hernia, or esophageal dysmotility.  We are proceeding with an EGD due to black stools as discussed above.  Will  reevaluate his esophagus at that time.  If no obvious abnormality to explain persistent dysphagia, would consider barium pill esophagram.  RUQ abdominal pain:  Intermittent RUQ abdominal pain for the last few months.  Seems most   consistent with MSK etiology as symptoms occur with certain movements.  Denies association with meals, bowel movements, or associated nausea/vomiting.  Doubt biliary etiology, but gallbladder is in situ.  He has also reported some intermittent black stools over the last few months, thus unable to rule out gastritis, duodenitis, PUD though this seems less likely.  He is chronically on PPI twice daily and denies NSAIDs.  We are arranging RUQ ultrasound, labs, and proceeding with an EGD in the near future due to reported black stools.  Periumbilical pain:  Worsening periumbilical abdominal pain at the site of prior umbilical hernia repair with mesh.  No obvious recurrent hernia on exam but he does have some what I suspect are edges of mesh poking up under the skin that is tender to palpation.  Patient would like to see general surgery to discuss if there are any options for him.    Plan:  CBC, CMP RUQ US Proceed with colonoscopy + EGD +/- dilation with propofol by Dr. Rourk in near future. The risks, benefits, and alternatives have been discussed with the patient in detail. The patient states understanding and desires to proceed.  ASA 3 Linzess 145 mcg daily x 4 days before starting colon prep.  Continue pantoprazole 40 mg twice daily. Avoid NSAIDs. Swallowing precautions discussed.  Separate instructions provided on AVS. Start Benefiber 2 teaspoons daily x 2 weeks, then increase to twice daily. Continue Anusol rectal cream for hemorrhoids as needed. Limit toilet time to 2-3 minutes. Avoid straining. Refer to general surgery for periumbilical pain/history of umbilical hernia repair with mesh. With after procedures.   Noraa Pickeral, PA-C [facility] 11/28/2022  

## 2022-11-28 ENCOUNTER — Other Ambulatory Visit: Payer: Self-pay | Admitting: *Deleted

## 2022-11-28 ENCOUNTER — Ambulatory Visit (INDEPENDENT_AMBULATORY_CARE_PROVIDER_SITE_OTHER): Payer: Commercial Managed Care - HMO | Admitting: Gastroenterology

## 2022-11-28 ENCOUNTER — Encounter: Payer: Self-pay | Admitting: Gastroenterology

## 2022-11-28 VITALS — BP 138/83 | HR 79 | Temp 97.5°F | Ht 67.0 in | Wt 207.0 lb

## 2022-11-28 DIAGNOSIS — Z86010 Personal history of colonic polyps: Secondary | ICD-10-CM

## 2022-11-28 DIAGNOSIS — K5900 Constipation, unspecified: Secondary | ICD-10-CM

## 2022-11-28 DIAGNOSIS — K921 Melena: Secondary | ICD-10-CM

## 2022-11-28 DIAGNOSIS — K625 Hemorrhage of anus and rectum: Secondary | ICD-10-CM | POA: Insufficient documentation

## 2022-11-28 DIAGNOSIS — R1033 Periumbilical pain: Secondary | ICD-10-CM

## 2022-11-28 DIAGNOSIS — K219 Gastro-esophageal reflux disease without esophagitis: Secondary | ICD-10-CM | POA: Diagnosis not present

## 2022-11-28 DIAGNOSIS — R1011 Right upper quadrant pain: Secondary | ICD-10-CM | POA: Diagnosis not present

## 2022-11-28 DIAGNOSIS — R1310 Dysphagia, unspecified: Secondary | ICD-10-CM

## 2022-11-28 DIAGNOSIS — K649 Unspecified hemorrhoids: Secondary | ICD-10-CM

## 2022-11-28 NOTE — Patient Instructions (Addendum)
Please have blood work completed at [facility].  We will arrange you to have an ultrasound of your liver and gallbladder to evaluate your right-sided abdominal pain.   We will arrange for you to have an upper endoscopy with possible stretching of your esophagus and colonoscopy in the near future with Dr. Gala Romney. We are providing you with samples of Linzess 145 mcg which you will take daily for 4 days before starting your colon prep to ensure that your bowels are moving well.  Continue pantoprazole 40 mg twice daily 30 minutes before breakfast and dinner.  Avoid all NSAID products including ibuprofen, Aleve, Advil, BC powders, Goody powders, and anything that says "NSAID" on the package.  Swallowing precautions:  Eat slowly, take small bites, chew thoroughly, drink plenty of liquids throughout meals.  Avoid trough textures All meats should be chopped finely.  If something gets hung in your esophagus and will not come up or go down, proceed to the emergency room.    For constipation: Start Benefiber 2 teaspoons daily x 2 weeks, then increase to twice daily. If you continue to have trouble with constipation, please let me know.  For hemorrhoids: Continue to use hydrocortisone cream as needed for rectal bleeding or pain related to hemorrhoids. Limit toilet time to 2-3 minutes. Avoid straining.  It was very nice to meet you today!  We will plan to see back in the office after your procedures.  Do not hesitate to call sooner if you have questions or concerns.  Aliene Altes, PA-C [facility]

## 2022-11-29 LAB — CBC WITH DIFFERENTIAL/PLATELET
Absolute Monocytes: 462 cells/uL (ref 200–950)
Basophils Absolute: 31 cells/uL (ref 0–200)
Basophils Relative: 0.4 %
Eosinophils Absolute: 46 cells/uL (ref 15–500)
Eosinophils Relative: 0.6 %
HCT: 49.1 % (ref 38.5–50.0)
Hemoglobin: 16.6 g/dL (ref 13.2–17.1)
Lymphs Abs: 1825 cells/uL (ref 850–3900)
MCH: 30.9 pg (ref 27.0–33.0)
MCHC: 33.8 g/dL (ref 32.0–36.0)
MCV: 91.3 fL (ref 80.0–100.0)
MPV: 9.7 fL (ref 7.5–12.5)
Monocytes Relative: 6 %
Neutro Abs: 5336 cells/uL (ref 1500–7800)
Neutrophils Relative %: 69.3 %
Platelets: 215 10*3/uL (ref 140–400)
RBC: 5.38 10*6/uL (ref 4.20–5.80)
RDW: 12.1 % (ref 11.0–15.0)
Total Lymphocyte: 23.7 %
WBC: 7.7 10*3/uL (ref 3.8–10.8)

## 2022-11-29 LAB — COMPLETE METABOLIC PANEL WITH GFR
AG Ratio: 1.1 (calc) (ref 1.0–2.5)
ALT: 14 U/L (ref 9–46)
AST: 16 U/L (ref 10–35)
Albumin: 4.1 g/dL (ref 3.6–5.1)
Alkaline phosphatase (APISO): 73 U/L (ref 35–144)
BUN: 18 mg/dL (ref 7–25)
CO2: 28 mmol/L (ref 20–32)
Calcium: 9.8 mg/dL (ref 8.6–10.3)
Chloride: 100 mmol/L (ref 98–110)
Creat: 1.04 mg/dL (ref 0.70–1.30)
Globulin: 3.7 g/dL (calc) (ref 1.9–3.7)
Glucose, Bld: 104 mg/dL — ABNORMAL HIGH (ref 65–99)
Potassium: 4.3 mmol/L (ref 3.5–5.3)
Sodium: 137 mmol/L (ref 135–146)
Total Bilirubin: 0.5 mg/dL (ref 0.2–1.2)
Total Protein: 7.8 g/dL (ref 6.1–8.1)
eGFR: 84 mL/min/{1.73_m2} (ref >=60)

## 2022-12-01 ENCOUNTER — Other Ambulatory Visit: Payer: Self-pay | Admitting: *Deleted

## 2022-12-01 ENCOUNTER — Encounter: Payer: Self-pay | Admitting: *Deleted

## 2022-12-01 MED ORDER — PEG 3350-KCL-NA BICARB-NACL 420 G PO SOLR: 4000.0000 mL | Freq: Once | ORAL | 0 refills | Status: AC

## 2022-12-10 ENCOUNTER — Other Ambulatory Visit: Payer: Self-pay

## 2022-12-10 DIAGNOSIS — I10 Essential (primary) hypertension: Secondary | ICD-10-CM

## 2022-12-10 MED ORDER — HYDROCHLOROTHIAZIDE 25 MG PO TABS: 25.0000 mg | ORAL_TABLET | Freq: Every day | ORAL | 3 refills | Status: DC

## 2022-12-11 ENCOUNTER — Telehealth: Payer: Self-pay | Admitting: *Deleted

## 2022-12-11 NOTE — Telephone Encounter (Signed)
[employer] PA: Authorization Number: [id number] Case Number: [id number] Review Date: 12/11/2022 8:00:18 AM Expiration Date: 06/09/2023 Status: Your case has been Approved. The prior authorization you submitted, Case [id number], has been received. Additional case status notifications will be sent if you opted in for email notifications. Thank you.

## 2022-12-12 ENCOUNTER — Ambulatory Visit (HOSPITAL_COMMUNITY)
Admission: RE | Admit: 2022-12-12 | Discharge: 2022-12-12 | Disposition: A | Discharge status: Home / Self Care | Payer: Commercial Managed Care - HMO | Source: Ambulatory Visit | Attending: Gastroenterology | Admitting: Gastroenterology

## 2022-12-12 DIAGNOSIS — R1011 Right upper quadrant pain: Secondary | ICD-10-CM | POA: Insufficient documentation

## 2022-12-12 DIAGNOSIS — R109 Unspecified abdominal pain: Secondary | ICD-10-CM | POA: Diagnosis not present

## 2022-12-16 ENCOUNTER — Encounter (HOSPITAL_COMMUNITY): Payer: Self-pay

## 2022-12-16 ENCOUNTER — Encounter (HOSPITAL_COMMUNITY)
Admission: RE | Admit: 2022-12-16 | Discharge: 2022-12-16 | Disposition: A | Discharge status: Home / Self Care | Payer: Commercial Managed Care - HMO | Source: Ambulatory Visit | Attending: Internal Medicine | Admitting: Internal Medicine

## 2022-12-16 ENCOUNTER — Other Ambulatory Visit: Payer: Self-pay

## 2022-12-18 ENCOUNTER — Encounter (HOSPITAL_COMMUNITY): Payer: Self-pay | Admitting: Internal Medicine

## 2022-12-18 ENCOUNTER — Encounter (HOSPITAL_COMMUNITY)
Admission: RE | Disposition: A | Discharge status: Home / Self Care | Payer: Self-pay | Source: Home / Self Care | Attending: Internal Medicine

## 2022-12-18 ENCOUNTER — Ambulatory Visit (HOSPITAL_COMMUNITY): Payer: Commercial Managed Care - HMO | Admitting: Anesthesiology

## 2022-12-18 ENCOUNTER — Ambulatory Visit (HOSPITAL_COMMUNITY)
Admission: RE | Admit: 2022-12-18 | Discharge: 2022-12-18 | Disposition: A | Discharge status: Home / Self Care | Payer: Commercial Managed Care - HMO | Attending: Internal Medicine | Admitting: Internal Medicine

## 2022-12-18 ENCOUNTER — Ambulatory Visit (HOSPITAL_BASED_OUTPATIENT_CLINIC_OR_DEPARTMENT_OTHER): Payer: Commercial Managed Care - HMO | Admitting: Anesthesiology

## 2022-12-18 DIAGNOSIS — Z86010 Personal history of colonic polyps: Secondary | ICD-10-CM | POA: Insufficient documentation

## 2022-12-18 DIAGNOSIS — Z8673 Personal history of transient ischemic attack (TIA), and cerebral infarction without residual deficits: Secondary | ICD-10-CM | POA: Diagnosis not present

## 2022-12-18 DIAGNOSIS — F17210 Nicotine dependence, cigarettes, uncomplicated: Secondary | ICD-10-CM | POA: Diagnosis not present

## 2022-12-18 DIAGNOSIS — K635 Polyp of colon: Secondary | ICD-10-CM

## 2022-12-18 DIAGNOSIS — Z79899 Other long term (current) drug therapy: Secondary | ICD-10-CM | POA: Insufficient documentation

## 2022-12-18 DIAGNOSIS — Z9049 Acquired absence of other specified parts of digestive tract: Secondary | ICD-10-CM | POA: Insufficient documentation

## 2022-12-18 DIAGNOSIS — Z09 Encounter for follow-up examination after completed treatment for conditions other than malignant neoplasm: Secondary | ICD-10-CM | POA: Insufficient documentation

## 2022-12-18 DIAGNOSIS — K449 Diaphragmatic hernia without obstruction or gangrene: Secondary | ICD-10-CM | POA: Insufficient documentation

## 2022-12-18 DIAGNOSIS — Z8719 Personal history of other diseases of the digestive system: Secondary | ICD-10-CM | POA: Insufficient documentation

## 2022-12-18 DIAGNOSIS — K219 Gastro-esophageal reflux disease without esophagitis: Secondary | ICD-10-CM | POA: Diagnosis not present

## 2022-12-18 DIAGNOSIS — K648 Other hemorrhoids: Secondary | ICD-10-CM | POA: Insufficient documentation

## 2022-12-18 DIAGNOSIS — D120 Benign neoplasm of cecum: Secondary | ICD-10-CM | POA: Diagnosis not present

## 2022-12-18 DIAGNOSIS — K5730 Diverticulosis of large intestine without perforation or abscess without bleeding: Secondary | ICD-10-CM | POA: Diagnosis not present

## 2022-12-18 DIAGNOSIS — J449 Chronic obstructive pulmonary disease, unspecified: Secondary | ICD-10-CM | POA: Insufficient documentation

## 2022-12-18 DIAGNOSIS — Z9889 Other specified postprocedural states: Secondary | ICD-10-CM | POA: Insufficient documentation

## 2022-12-18 DIAGNOSIS — G4730 Sleep apnea, unspecified: Secondary | ICD-10-CM | POA: Diagnosis not present

## 2022-12-18 DIAGNOSIS — G8929 Other chronic pain: Secondary | ICD-10-CM | POA: Diagnosis not present

## 2022-12-18 DIAGNOSIS — Z1211 Encounter for screening for malignant neoplasm of colon: Secondary | ICD-10-CM | POA: Insufficient documentation

## 2022-12-18 DIAGNOSIS — I10 Essential (primary) hypertension: Secondary | ICD-10-CM

## 2022-12-18 DIAGNOSIS — K2950 Unspecified chronic gastritis without bleeding: Secondary | ICD-10-CM | POA: Diagnosis not present

## 2022-12-18 DIAGNOSIS — K602 Anal fissure, unspecified: Secondary | ICD-10-CM | POA: Diagnosis not present

## 2022-12-18 DIAGNOSIS — K222 Esophageal obstruction: Secondary | ICD-10-CM | POA: Diagnosis not present

## 2022-12-18 DIAGNOSIS — K254 Chronic or unspecified gastric ulcer with hemorrhage: Secondary | ICD-10-CM | POA: Diagnosis not present

## 2022-12-18 DIAGNOSIS — K921 Melena: Secondary | ICD-10-CM

## 2022-12-18 DIAGNOSIS — R1310 Dysphagia, unspecified: Secondary | ICD-10-CM

## 2022-12-18 DIAGNOSIS — K5780 Diverticulitis of intestine, part unspecified, with perforation and abscess without bleeding: Secondary | ICD-10-CM | POA: Insufficient documentation

## 2022-12-18 DIAGNOSIS — F1910 Other psychoactive substance abuse, uncomplicated: Secondary | ICD-10-CM | POA: Diagnosis not present

## 2022-12-18 HISTORY — PX: MALONEY DILATION: SHX5535

## 2022-12-18 HISTORY — PX: POLYPECTOMY: SHX5525

## 2022-12-18 HISTORY — PX: ESOPHAGOGASTRODUODENOSCOPY (EGD) WITH PROPOFOL: SHX5813

## 2022-12-18 HISTORY — PX: COLONOSCOPY WITH PROPOFOL: SHX5780

## 2022-12-18 HISTORY — PX: BIOPSY: SHX5522

## 2022-12-18 SURGERY — COLONOSCOPY WITH PROPOFOL
Anesthesia: General

## 2022-12-18 MED ORDER — LIDOCAINE HCL (CARDIAC) PF 100 MG/5ML IV SOSY
PREFILLED_SYRINGE | INTRAVENOUS | Status: DC | PRN
  Administered 2022-12-18: 50 mg via INTRATRACHEAL

## 2022-12-18 MED ORDER — LACTATED RINGERS IV SOLN: INTRAVENOUS | Status: DC | PRN

## 2022-12-18 MED ORDER — PROPOFOL 500 MG/50ML IV EMUL
INTRAVENOUS | Status: DC | PRN
  Administered 2022-12-18: 150 ug/kg/min via INTRAVENOUS

## 2022-12-18 MED ORDER — PROPOFOL 10 MG/ML IV BOLUS
INTRAVENOUS | Status: DC | PRN
  Administered 2022-12-18: 100 mg via INTRAVENOUS
  Administered 2022-12-18: 20 mg via INTRAVENOUS

## 2022-12-18 MED ORDER — GLUCAGON HCL RDNA (DIAGNOSTIC) 1 MG IJ SOLR
INTRAMUSCULAR | Status: DC | PRN
  Administered 2022-12-18: 1 mg via INTRAVENOUS

## 2022-12-18 NOTE — Transfer of Care (Signed)
Immediate Anesthesia Transfer of Care Note  Patient: [name]  Procedure(s) Performed: COLONOSCOPY WITH PROPOFOL ESOPHAGOGASTRODUODENOSCOPY (EGD) WITH PROPOFOL MALONEY DILATION BIOPSY POLYPECTOMY  Patient Location: Short Stay  Anesthesia Type:General  Level of Consciousness: awake, alert , oriented, and patient cooperative  Airway & Oxygen Therapy: Patient Spontanous Breathing  Post-op Assessment: Report given to RN, Post -op Vital signs reviewed and stable, and Patient moving all extremities  Post vital signs: Reviewed and stable  Last Vitals:  Vitals Value Taken Time  BP    Temp    Pulse    Resp    SpO2      Last Pain:  Vitals:   12/18/22 0853  TempSrc:   PainSc: 0-No pain         Complications: No notable events documented.

## 2022-12-18 NOTE — Discharge Instructions (Signed)
EGD Discharge instructions Please read the instructions outlined below and refer to this sheet in the next few weeks. These discharge instructions provide you with general information on caring for yourself after you leave the hospital. Your doctor may also give you specific instructions. While your treatment has been planned according to the most current medical practices available, unavoidable complications occasionally occur. If you have any problems or questions after discharge, please call your doctor. ACTIVITY You may resume your regular activity but move at a slower pace for the next 24 hours.  Take frequent rest periods for the next 24 hours.  Walking will help expel (get rid of) the air and reduce the bloated feeling in your abdomen.  No driving for 24 hours (because of the anesthesia (medicine) used during the test).  You may shower.  Do not sign any important legal documents or operate any machinery for 24 hours (because of the anesthesia used during the test).  NUTRITION Drink plenty of fluids.  You may resume your normal diet.  Begin with a light meal and progress to your normal diet.  Avoid alcoholic beverages for 24 hours or as instructed by your caregiver.  MEDICATIONS You may resume your normal medications unless your caregiver tells you otherwise.  WHAT YOU CAN EXPECT TODAY You may experience abdominal discomfort such as a feeling of fullness or "gas" pains.  FOLLOW-UP Your doctor will discuss the results of your test with you.  SEEK IMMEDIATE MEDICAL ATTENTION IF ANY OF THE FOLLOWING OCCUR: Excessive nausea (feeling sick to your stomach) and/or vomiting.  Severe abdominal pain and distention (swelling).  Trouble swallowing.  Temperature over 101 F (37.8 C).  Rectal bleeding or vomiting of blood.    Colonoscopy Discharge Instructions  Read the instructions outlined below and refer to this sheet in the next few weeks. These discharge instructions provide you with  general information on caring for yourself after you leave the hospital. Your doctor may also give you specific instructions. While your treatment has been planned according to the most current medical practices available, unavoidable complications occasionally occur. If you have any problems or questions after discharge, call Dr. Gala Romney at [phone]. ACTIVITY You may resume your regular activity, but move at a slower pace for the next 24 hours.  Take frequent rest periods for the next 24 hours.  Walking will help get rid of the air and reduce the bloated feeling in your belly (abdomen).  No driving for 24 hours (because of the medicine (anesthesia) used during the test).   Do not sign any important legal documents or operate any machinery for 24 hours (because of the anesthesia used during the test).  NUTRITION Drink plenty of fluids.  You may resume your normal diet as instructed by your doctor.  Begin with a light meal and progress to your normal diet. Heavy or fried foods are harder to digest and may make you feel sick to your stomach (nauseated).  Avoid alcoholic beverages for 24 hours or as instructed.  MEDICATIONS You may resume your normal medications unless your doctor tells you otherwise.  WHAT YOU CAN EXPECT TODAY Some feelings of bloating in the abdomen.  Passage of more gas than usual.  Spotting of blood in your stool or on the toilet paper.  IF YOU HAD POLYPS REMOVED DURING THE COLONOSCOPY: No aspirin products for 7 days or as instructed.  No alcohol for 7 days or as instructed.  Eat a soft diet for the next 24 hours.  FINDING  OUT THE RESULTS OF YOUR TEST Not all test results are available during your visit. If your test results are not back during the visit, make an appointment with your caregiver to find out the results. Do not assume everything is normal if you have not heard from your caregiver or the medical facility. It is important for you to follow up on all of your test  results.  SEEK IMMEDIATE MEDICAL ATTENTION IF: You have more than a spotting of blood in your stool.  Your belly is swollen (abdominal distention).  You are nauseated or vomiting.  You have a temperature over 101.  You have abdominal pain or discomfort that is severe or gets worse throughout the day.     Your esophagus was dilated today  Your stomach was inflamed and it was biopsied  3 colon polyps and diverticulosis found in your colon.  All polyps removed.   Further recommendations to follow pending review of pathology report   at patient request, I called Janora Norlander at [phone] rolled to voicemail.  I left a message.

## 2022-12-18 NOTE — Op Note (Signed)
[facility] Patient Name: [name] Procedure Date: 12/18/2022 8:33 AM MRN: [id number] Date of Birth: [birth year] Attending MD: Gennette Pac , MD, [id number] CSN: [id number] Age: 57 Admit Type: Outpatient Procedure:                Upper GI endoscopy Indications:              Dysphagia Providers:                Gennette Pac, MD, Jannett Celestine, RN, Dyann Ruddle Referring MD:              Medicines:                Propofol per Anesthesia Complications:            No immediate complications. Estimated Blood Loss:     Estimated blood loss was minimal. Procedure:                Pre-Anesthesia Assessment:                           - Prior to the procedure, a History and Physical                            was performed, and patient medications and                            allergies were reviewed. The patient's tolerance of                            previous anesthesia was also reviewed. The risks                            and benefits of the procedure and the sedation                            options and risks were discussed with the patient.                            All questions were answered, and informed consent                            was obtained. Prior Anticoagulants: The patient has                            taken no anticoagulant or antiplatelet agents. ASA                            Grade Assessment: III - A patient with severe                            systemic disease. After reviewing the risks and  benefits, the patient was deemed in satisfactory                            condition to undergo the procedure.                           After obtaining informed consent, the endoscope was                            passed under direct vision. Throughout the                            procedure, the patient's blood pressure, pulse, and                            oxygen saturations were monitored  continuously. The                            [id number]-H190 ([id number]) scope was introduced through the                            mouth, and advanced to the second part of duodenum.                            The upper GI endoscopy was accomplished without                            difficulty. The patient tolerated the procedure                            well. Scope In: 8:59:12 AM Scope Out: 9:07:50 AM Total Procedure Duration: 0 hours 8 minutes 38 seconds  Findings:      A mild Schatzki ring was found at the gastroesophageal junction. No       esophagitis. No Barrett's epithelium. No tumor.      A small hiatal hernia was present. Scattered gastric erosions. No ulcer       or infiltrating process. Patent pylorus.      The duodenal bulb and second portion of the duodenum were normal. The       scope was withdrawn. Dilation was performed with a Maloney dilator with       mild resistance at 56 Fr. The dilation site was examined following       endoscope reinsertion and showed mild mucosal disruption. Estimated       blood loss: none. Finally, the abnormal gastric mucosa was biopsied for       histologic study. Impression:               - Mild Schatzki ring. Dilated.                           - Small hiatal hernia. Gastric erosions. Gastric                            biopsy                           -  Normal duodenal bulb and second portion of the                            duodenum. Moderate Sedation:      Moderate (conscious) sedation was personally administered by an       anesthesia professional. The following parameters were monitored: oxygen       saturation, heart rate, blood pressure, respiratory rate, EKG, adequacy       of pulmonary ventilation, and response to care. Recommendation:           - Patient has a contact number available for                            emergencies. The signs and symptoms of potential                            delayed complications were discussed with the                             patient. Return to normal activities tomorrow.                            Written discharge instructions were provided to the                            patient.                           - Resume previous diet.                           - Continue present medications.                           - Await pathology results.                           - Return to my office (date not yet determined).                            See colonoscopy report. Procedure Code(s):        --- Professional ---                           [phone], Esophagogastroduodenoscopy, flexible,                            transoral; diagnostic, including collection of                            specimen(s) by brushing or washing, when performed                            (separate procedure)                           43450, Dilation of esophagus, by unguided sound or  bougie, single or multiple passes Diagnosis Code(s):        --- Professional ---                           K22.2, Esophageal obstruction                           K44.9, Diaphragmatic hernia without obstruction or                            gangrene                           R13.10, Dysphagia, unspecified CPT copyright 2022 [employer]. All rights reserved. The codes documented in this report are preliminary and upon coder review may  be revised to meet current compliance requirements. Gerrit Friendsobert M. Beckham Buxbaum, MD Gennette Pacobert Michael Yasmine Kilbourne, MD 12/18/2022 9:41:44 AM This report has been signed electronically. Number of Addenda: 0

## 2022-12-18 NOTE — Interval H&P Note (Signed)
History and Physical Interval Note:  12/18/2022 8:50 AM  [name]  has presented today for surgery, with the diagnosis of history of polyps,dysphagia.  The various methods of treatment have been discussed with the patient and family. After consideration of risks, benefits and other options for treatment, the patient has consented to  Procedure(s) with comments: COLONOSCOPY WITH PROPOFOL (N/A) - 8:45 am ESOPHAGOGASTRODUODENOSCOPY (EGD) WITH PROPOFOL (N/A) MALONEY DILATION (N/A) as a surgical intervention.  The patient's history has been reviewed, patient examined, no change in status, stable for surgery.  I have reviewed the patient's chart and labs.  Questions were answered to the patient's satisfaction.     [name]  patient seen in short stay.  No change.  Ultrasound fatty liver.  EGD with ED as feasible/appropriate diagnostic colonoscopy per plan. The risks, benefits, limitations, imponderables and alternatives regarding both EGD and colonoscopy have been reviewed with the patient. Questions have been answered. All parties agreeable.

## 2022-12-18 NOTE — Anesthesia Preprocedure Evaluation (Signed)
Anesthesia Evaluation  Patient identified by MRN, date of birth, ID band Patient awake    Reviewed: Allergy & Precautions, H&P , NPO status , Patient's Chart, lab work & pertinent test results, reviewed documented beta blocker date and time   Airway Mallampati: II  TM Distance: >3 FB Neck ROM: full    Dental no notable dental hx.    Pulmonary neg pulmonary ROS, sleep apnea , COPD, Current Smoker and Patient abstained from smoking.   Pulmonary exam normal breath sounds clear to auscultation       Cardiovascular Exercise Tolerance: Good hypertension, negative cardio ROS  Rhythm:regular Rate:Normal     Neuro/Psych  PSYCHIATRIC DISORDERS Anxiety Depression    TIA Neuromuscular disease CVA negative neurological ROS  negative psych ROS   GI/Hepatic negative GI ROS, Neg liver ROS, hiatal hernia,GERD  ,,  Endo/Other  negative endocrine ROS    Renal/GU Renal diseasenegative Renal ROS  negative genitourinary   Musculoskeletal   Abdominal   Peds  Hematology negative hematology ROS (+)   Anesthesia Other Findings   Reproductive/Obstetrics negative OB ROS                             Anesthesia Physical Anesthesia Plan  ASA: 3  Anesthesia Plan: General   Post-op Pain Management:    Induction:   PONV Risk Score and Plan: Propofol infusion  Airway Management Planned:   Additional Equipment:   Intra-op Plan:   Post-operative Plan:   Informed Consent: I have reviewed the patients History and Physical, chart, labs and discussed the procedure including the risks, benefits and alternatives for the proposed anesthesia with the patient or authorized representative who has indicated his/her understanding and acceptance.     Dental Advisory Given  Plan Discussed with: CRNA  Anesthesia Plan Comments:        Anesthesia Quick Evaluation

## 2022-12-18 NOTE — Op Note (Signed)
[facility] Patient Name: [name] Procedure Date: 12/18/2022 8:30 AM MRN: [id number] Date of Birth: 1966-04-29 Attending MD: Gennette Pac , MD, [id number] CSN: [id number] Age: 57 Admit Type: Outpatient Procedure:                Colonoscopy Indications:              Hematochezia Providers:                Gennette Pac, MD, Jannett Celestine, RN, Dyann Ruddle Referring MD:              Medicines:                Propofol per Anesthesia Complications:            No immediate complications. Estimated Blood Loss:     Estimated blood loss was minimal. Procedure:                Pre-Anesthesia Assessment:                           - Prior to the procedure, a History and Physical                            was performed, and patient medications and                            allergies were reviewed. The patient's tolerance of                            previous anesthesia was also reviewed. The risks                            and benefits of the procedure and the sedation                            options and risks were discussed with the patient.                            All questions were answered, and informed consent                            was obtained. Prior Anticoagulants: The patient has                            taken no anticoagulant or antiplatelet agents. ASA                            Grade Assessment: III - A patient with severe                            systemic disease. After reviewing the risks and  benefits, the patient was deemed in satisfactory                            condition to undergo the procedure.                           After obtaining informed consent, the colonoscope                            was passed under direct vision. Throughout the                            procedure, the patient's blood pressure, pulse, and                            oxygen saturations were monitored  continuously. The                            [id number]) scope was introduced through the                            anus and advanced to the the cecum, identified by                            appendiceal orifice and ileocecal valve. The                            colonoscopy was performed without difficulty. The                            patient tolerated the procedure well. The quality                            of the bowel preparation was adequate. The                            ileocecal valve, appendiceal orifice, and rectum                            were photographed. Scope In: 9:16:57 AM Scope Out: 9:33:53 AM Scope Withdrawal Time: 0 hours 14 minutes 51 seconds  Total Procedure Duration: 0 hours 16 minutes 56 seconds  Findings:      The perianal and digital rectal examinations were normal.      Three semi-pedunculated polyps were found in the ileocecal valve. The       polyps were 5 to 7 mm in size. These polyps were removed with a cold       snare. Resection and retrieval were complete. Estimated blood loss was       minimal.      Scattered medium-mouthed diverticula were found in the entire colon.       Surgical anastomosis identified and appeared normal. Impression:               - Three 5 to 7 mm polyps at the ileocecal valve,  removed with a cold snare. Resected and retrieved.                           - Diverticulosis in the entire examined colon.                            Minimal internal hemorrhoids. Moderate Sedation:      Moderate (conscious) sedation was personally administered by an       anesthesia professional. The following parameters were monitored: oxygen       saturation, heart rate, blood pressure, respiratory rate, EKG, adequacy       of pulmonary ventilation, and response to care. Recommendation:           - Patient has a contact number available for                            emergencies. The signs and symptoms of  potential                            delayed complications were discussed with the                            patient. Return to normal activities tomorrow.                            Written discharge instructions were provided to the                            patient.                           - Resume previous diet.                           - Continue present medications.                           - Repeat colonoscopy date to be determined after                            pending pathology results are reviewed for                            surveillance.                           - Return to GI office (date not yet determined).                            See EGD report. Procedure Code(s):        --- Professional ---                           [phone], Colonoscopy, flexible; with removal of  tumor(s), polyp(s), or other lesion(s) by snare                            technique Diagnosis Code(s):        --- Professional ---                           D12.0, Benign neoplasm of cecum                           K92.1, Melena (includes Hematochezia)                           K57.30, Diverticulosis of large intestine without                            perforation or abscess without bleeding CPT copyright 2022 American Medical Association. All rights reserved. The codes documented in this report are preliminary and upon coder review may  be revised to meet current compliance requirements. Gerrit Friends. Timiyah Romito, MD Gennette Pac, MD 12/18/2022 9:45:24 AM This report has been signed electronically. Number of Addenda: 0

## 2022-12-19 LAB — SURGICAL PATHOLOGY

## 2022-12-19 NOTE — Anesthesia Postprocedure Evaluation (Signed)
Anesthesia Post Note  Patient: ALGOT DENNINGTON  Procedure(s) Performed: COLONOSCOPY WITH PROPOFOL ESOPHAGOGASTRODUODENOSCOPY (EGD) WITH PROPOFOL MALONEY DILATION BIOPSY POLYPECTOMY  Patient location during evaluation: Phase II Anesthesia Type: General Level of consciousness: awake Pain management: pain level controlled Vital Signs Assessment: post-procedure vital signs reviewed and stable Respiratory status: spontaneous breathing and respiratory function stable Cardiovascular status: blood pressure returned to baseline and stable Postop Assessment: no headache and no apparent nausea or vomiting Anesthetic complications: no Comments: Late entry   No notable events documented.   Last Vitals:  Vitals:   12/18/22 0802 12/18/22 0938  BP: (!) 140/104 123/86  Pulse: 66 69  Resp: 16 16  Temp:  (!) 36.4 C  SpO2:      Last Pain:  Vitals:   12/18/22 0938  TempSrc: Axillary  PainSc: 0-No pain                 Windell Norfolk

## 2022-12-23 ENCOUNTER — Ambulatory Visit (INDEPENDENT_AMBULATORY_CARE_PROVIDER_SITE_OTHER): Payer: Commercial Managed Care - HMO | Admitting: Surgery

## 2022-12-23 ENCOUNTER — Encounter: Payer: Self-pay | Admitting: Surgery

## 2022-12-23 VITALS — BP 143/99 | HR 80 | Temp 97.9°F | Resp 18 | Ht 67.0 in | Wt 208.0 lb

## 2022-12-23 DIAGNOSIS — R1033 Periumbilical pain: Secondary | ICD-10-CM | POA: Diagnosis not present

## 2022-12-23 NOTE — Progress Notes (Signed)
[facility] History and Physical  Reason for Referral: Periumbilical pain Referring Physician: Ermalinda Memos, PA  Chief Complaint   New Patient (Initial Visit)     [name] is a 57 y.o. male.  HPI: Patient presents for evaluation of periumbilical pain.  States that over the last couple of years he has had increasing pain right at his bellybutton.  There is a small area poking under his skin which causes his pain.  He had an umbilical hernia repair with mesh in 2008 with Dr. Gabriel Cirri.  Since that time he has had multiple abdominal surgeries.  He states that he had diverticulitis while he was in prison.  He was scheduled to undergo a colonoscopy but subsequently developed a perforation from his diverticulitis.  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  They attempted to reverse this ostomy in 2011, but he required more bowel resection, and was given an ileostomy.  He had his ileostomy reversed in 2013.  He also has an history of an appendectomy.  He baseline has chronic abdominal pain issues and receives colonoscopies every 3 years.  He currently denies any abdominal pain, nausea, and vomiting.  His past medical history significant for hypertension.  He denies use of blood thinning medications.  He smokes a pack and half cigarettes per day and occasionally will smoke marijuana.  He denies use of alcohol.  Past Medical History:  Diagnosis Date   Acute pancreatitis    Anxiety    Arthritis    Colonic adenoma    COPD (chronic obstructive pulmonary disease)    Depression    Diverticulitis of colon with perforation 08/2010   GERD (gastroesophageal reflux disease)    History of hiatal hernia    Hypertension    Polysubstance abuse    Renal insufficiency    Sleep apnea    Intolerant of CPAP   Stroke    TIA (transient ischemic attack)     Past Surgical History:  Procedure Laterality Date   APPENDECTOMY     BALLOON DILATION N/A 08/04/2022    Procedure: BALLOON DILATION;  Surgeon: Lanelle Bal, DO;  [facility];  Service: Endoscopy;  Laterality: N/A;   BIOPSY  12/14/2017   Procedure: BIOPSY;  Surgeon: Corbin Ade, MD;  [facility];  Service: Endoscopy;;  gastric    BIOPSY  10/13/2019   Procedure: BIOPSY;  Surgeon: Corbin Ade, MD;  [facility];  Service: Endoscopy;;  esophagus   BIOPSY  05/20/2021   Procedure: BIOPSY;  Surgeon: Corbin Ade, MD;  [facility];  Service: Endoscopy;;   clamp left in colon after abd surgery     COLON SURGERY  05/2011   [facility]. exp laparotomy with colostomy takedown, lysis of adhesions (3hours), loop ileostomy. complicated by abscess formation requiring percutaneous drainage   colonoscopy via stoma with endoscopy of Sammuel Hines  04/2011   [facility]. two sessile polyps in trv colon. complete resection but only partial retrieval. congested mucosa in area 3cm proximal to stoma, erythematous mucosa in Hartmann pouch. repeat tcs 8/2017path-adenomatous polyp   COLONOSCOPY WITH PROPOFOL N/A 12/14/2017   Dr. Jena Gauss: 2 4 to 6 mm polyps in the cecum, tubular adenomas, single rectal polyp tubular adenoma, nonbleeding internal hemorrhoids.  Appears to be a surgical clip at the appendiceal orifice.  Next colonoscopy in 5 years   ESOPHAGEAL MANOMETRY N/A 12/11/2021   Procedure: ESOPHAGEAL MANOMETRY (EM);  Surgeon: Napoleon Form, MD;  [facility];  Service: Endoscopy;  Laterality: N/A;   ESOPHAGOGASTRODUODENOSCOPY  03/05/2012   erosive reflux esophagitis, bulbar erosions, gastric erosions/petichae, bx showed gastritis, no H.Pylori. Procedure: ESOPHAGOGASTRODUODENOSCOPY (EGD);  Surgeon: Corbin Ade, MD;  [facility];  Service: Endoscopy;  Laterality: N/A;  11:30   ESOPHAGOGASTRODUODENOSCOPY (EGD) WITH PROPOFOL N/A 12/14/2017   Dr. Jena Gauss: Erosive reflux esophagitis with questionable mild stricturing/non-obstruction at this level  status post dilation.  multiple gastric erosions in the stomach biopsy showed reactive gastropathy but no H. pylori.   ESOPHAGOGASTRODUODENOSCOPY (EGD) WITH PROPOFOL N/A 10/13/2019   negative Barrett's, normal stomach   ESOPHAGOGASTRODUODENOSCOPY (EGD) WITH PROPOFOL N/A 05/20/2021   inlet patch with superimposed nodule s/p biopsy. Normal esophagus s/p dilation. Normal gastric mucosa. Normal duodenum. Reflux changes on path.   ESOPHAGOGASTRODUODENOSCOPY (EGD) WITH PROPOFOL N/A 08/04/2022   Procedure: ESOPHAGOGASTRODUODENOSCOPY (EGD) WITH PROPOFOL;  Surgeon: Lanelle Bal, DO;  [facility];  Service: Endoscopy;  Laterality: N/A;  1:15 pm   FOOT SURGERY Left    strethching of ligaments   HERNIA REPAIR     umbilical   KNEE SURGERY Right 2006   MALONEY DILATION N/A 12/14/2017   Procedure: Elease Hashimoto DILATION;  Surgeon: Corbin Ade, MD;  [facility];  Service: Endoscopy;  Laterality: N/A;   MALONEY DILATION  05/20/2021   Procedure: Elease Hashimoto DILATION;  Surgeon: Corbin Ade, MD;  [facility];  Service: Endoscopy;;   MULTIPLE EXTRACTIONS WITH ALVEOLOPLASTY  02/23/2012   Procedure: MULTIPLE EXTRACION WITH ALVEOLOPLASTY;  Surgeon: Georgia Lopes, DDS;  [facility];  Service: Oral Surgery;  Laterality: Bilateral;  Removal of left mandibular torus, multiple extractions with alveoloplasty   POLYPECTOMY  12/14/2017   Procedure: POLYPECTOMY;  Surgeon: Corbin Ade, MD;  [facility];  Service: Endoscopy;;   reversal of colostomy  10/2011   per patient, did not have report   right knee surgery     sigmoid colectomy with end colostomy, Hartmann's pouch  08/2010   [facility]. complicated diverticulitis   UMBILICAL HERNIA REPAIR      Family History  Problem Relation Age of Onset   Stomach cancer Father    Lung cancer Father    Throat cancer Father    Cancer Father        lung and throat cancer   Hypertension Father    Liver disease Cousin         cirrhosis, etoh   Throat cancer Paternal Uncle    Lung cancer Paternal Uncle    Colon cancer Cousin 24   Heart disease Mother    Hypertension Mother    Colon cancer Maternal Uncle    Throat cancer Paternal Uncle    Lung cancer Paternal Uncle    Throat cancer Paternal Uncle    Lung cancer Paternal Uncle     Social History   Tobacco Use   Smoking status: Every Day    Packs/day: 1.00    Years: 37.00    Additional pack years: 0.00    Total pack years: 37.00    Types: Cigarettes    Passive exposure: Current   Smokeless tobacco: Former   Tobacco comments:    Trying to quit, has nicotine patches at home which have helped him quit before  Vaping Use   Vaping Use: Never used  Substance Use Topics   Alcohol use: Not Currently   Drug use: Yes    Types: Marijuana    Comment: occasional  Medications: I have reviewed the patient's current medications. Allergies as of 12/23/2022       Reactions   Codeine Itching, Nausea And Vomiting   Latex Other (See Comments)   Turns skin red.    Morphine Hives   Zestril [lisinopril]    Caused Kidney failure   Adhesive [tape] Rash        Medication List        Accurate as of December 23, 2022  9:28 AM. If you have any questions, ask your nurse or doctor.          albuterol 108 (90 Base) MCG/ACT inhaler Commonly known as: VENTOLIN HFA Inhale 2 puffs into the lungs every 6 (six) hours as needed for wheezing or shortness of breath.   Buprenorphine HCl-Naloxone HCl 8-2 MG Film PLACE 1 STRIP UNDER THE TONGUE TWICE DAILY   diclofenac Sodium 1 % Gel Commonly known as: VOLTAREN APPLY 2 GRAMS  TOPICALLY 4 TIMES DAILY What changed: See the new instructions.   hydrochlorothiazide 25 MG tablet Commonly known as: HYDRODIURIL Take 1 tablet (25 mg total) by mouth daily.   hydrocortisone 2.5 % rectal cream Commonly known as: ANUSOL-HC Place 1 Application rectally 2 (two) times daily as needed for hemorrhoids.   pantoprazole 40 MG  tablet Commonly known as: PROTONIX Take 1 tablet (40 mg total) by mouth 2 (two) times daily.   sildenafil 100 MG tablet Commonly known as: VIAGRA Take 1 tablet (100 mg total) by mouth as needed for erectile dysfunction.   spironolactone 50 MG tablet Commonly known as: Aldactone Take 1 tablet (50 mg total) by mouth daily.         ROS:  Constitutional: negative for chills, fatigue, and fevers Eyes: negative for visual disturbance and pain Ears, nose, mouth, throat, and face: negative for ear drainage, sore throat, and sinus problems Respiratory: positive for shortness of breath, negative for cough Cardiovascular: negative for chest pain and palpitations Gastrointestinal: positive for abdominal pain and reflux symptoms, negative for nausea and vomiting Genitourinary:positive for dysuria and frequency Integument/breast: negative for dryness and rash Hematologic/lymphatic: negative for bleeding and lymphadenopathy Musculoskeletal:positive for back pain and neck pain Neurological: positive for dizziness Endocrine: negative for temperature intolerance  Blood pressure (!) 143/99, pulse 80, temperature 97.9 F (36.6 C), temperature source Oral, resp. rate 18, height 5\' 7"  (1.702 m), weight 208 lb (94.3 kg), SpO2 92 %. Physical Exam Vitals reviewed.  Constitutional:      Appearance: Normal appearance.  HENT:     Head: Normocephalic and atraumatic.  Eyes:     Extraocular Movements: Extraocular movements intact.     Pupils: Pupils are equal, round, and reactive to light.  Cardiovascular:     Rate and Rhythm: Normal rate and regular rhythm.  Pulmonary:     Effort: Pulmonary effort is normal.     Breath sounds: Normal breath sounds.  Abdominal:     Comments: Abdomen soft, nondistended, no percussion tenderness, nontender to palpation; no rigidity, guarding, rebound tenderness; multiple abdominal cicatrix- midline, right lower quadrant, and left lower quadrant; small area poking  under umbilicus with no overlying skin changes, mildly tender to palpation  Musculoskeletal:        General: Normal range of motion.     Cervical back: Normal range of motion.  Skin:    General: Skin is warm and dry.  Neurological:     General: No focal deficit present.     Mental Status: He is alert and oriented to person, place, and  time.  Psychiatric:        Mood and Affect: Mood normal.        Behavior: Behavior normal.    Results: No results found for this or any previous visit (from the past 48 hour(s)).  No results found.   Assessment & Plan:  [name] is a 57 y.o. male who presents with periumbilical pain and concern for underlying mesh poking his skin.  -I explained to the patient that while I am able to feel this area of concern, with his multiple abdominal surgeries, he is high risk for complication.   -I explained that he likely had this mesh cut during one of his exploratory laparotomies, and the edge is poking up.  If I were to go in there to try and excise this area, he is at risk for bowel injury.  For this reason, I recommend against operating on the area at this time as I do not believe the benefits outweigh the risk -We discussed that if he develops an open wound at the area, he should follow-up, as the area would need to be excised at that time -Follow up as needed  All questions were answered to the satisfaction of the patient  Theophilus Kinds, DO [facility] [address] [place], [place] [address] [phone] (office)

## 2022-12-23 NOTE — Patient Instructions (Signed)
-  Call if this area starts to come completely through your skin -If it is not coming through the skin, the risk of injury to other structures outweighs the benefit of [place] removing the area

## 2022-12-25 ENCOUNTER — Encounter (HOSPITAL_COMMUNITY): Payer: Self-pay | Admitting: Internal Medicine

## 2022-12-31 ENCOUNTER — Encounter: Payer: Self-pay | Admitting: Internal Medicine

## 2023-01-05 ENCOUNTER — Ambulatory Visit (INDEPENDENT_AMBULATORY_CARE_PROVIDER_SITE_OTHER): Payer: Commercial Managed Care - HMO

## 2023-01-05 ENCOUNTER — Ambulatory Visit: Payer: Medicaid Other | Admitting: Podiatry

## 2023-01-05 DIAGNOSIS — M79672 Pain in left foot: Secondary | ICD-10-CM

## 2023-01-05 DIAGNOSIS — M7752 Other enthesopathy of left foot: Secondary | ICD-10-CM

## 2023-01-05 DIAGNOSIS — S93402A Sprain of unspecified ligament of left ankle, initial encounter: Secondary | ICD-10-CM

## 2023-01-05 MED ORDER — MELOXICAM 15 MG PO TABS: 15.0000 mg | ORAL_TABLET | Freq: Every day | ORAL | 1 refills | Status: DC

## 2023-01-05 MED ORDER — TRIAMCINOLONE ACETONIDE 40 MG/ML IJ SUSP: 20.0000 mg | Freq: Once | INTRAMUSCULAR | Status: DC

## 2023-01-05 NOTE — Progress Notes (Signed)
Chief Complaint  Patient presents with   Foot Injury    Patient came in today for left foot injury which happened over a week ago,  lateral side of the foot and ankle, patient states that he twisted his foot while working, rate of pain 5 out of 10, patient is having some swelling,     HPI: 57 y.o. male presenting today for new injury that occurred to the patient's left ankle about 1 week ago.  Patient does have a history of peroneal tendon repair and endoscopic plantar fasciotomy approximately 1 year ago.  Patient states that he has always had some chronic low-grade pain and tenderness but a week ago he twisted his left foot and ankle and has had increased pain and tenderness.  Past Medical History:  Diagnosis Date   Acute pancreatitis    Anxiety    Arthritis    Colonic adenoma    COPD (chronic obstructive pulmonary disease) (HCC)    Depression    Diverticulitis of colon with perforation 08/2010   GERD (gastroesophageal reflux disease)    History of hiatal hernia    Hypertension    Polysubstance abuse (HCC)    Renal insufficiency    Sleep apnea    Intolerant of CPAP   Stroke (HCC)    TIA (transient ischemic attack)     Past Surgical History:  Procedure Laterality Date   APPENDECTOMY     BALLOON DILATION N/A 08/04/2022   Procedure: BALLOON DILATION;  Surgeon: Lanelle Bal, DO;  [facility];  Service: Endoscopy;  Laterality: N/A;   BIOPSY  12/14/2017   Procedure: BIOPSY;  Surgeon: Corbin Ade, MD;  [facility];  Service: Endoscopy;;  gastric    BIOPSY  10/13/2019   Procedure: BIOPSY;  Surgeon: Corbin Ade, MD;  [facility];  Service: Endoscopy;;  esophagus   BIOPSY  05/20/2021   Procedure: BIOPSY;  Surgeon: Corbin Ade, MD;  [facility];  Service: Endoscopy;;   BIOPSY  12/18/2022   Procedure: BIOPSY;  Surgeon: Corbin Ade, MD;  [facility];  Service: Endoscopy;;   clamp left in colon after abd  surgery     COLON SURGERY  05/2011   [facility]. exp laparotomy with colostomy takedown, lysis of adhesions (3hours), loop ileostomy. complicated by abscess formation requiring percutaneous drainage   colonoscopy via stoma with endoscopy of Sammuel Hines  04/2011   [facility]. two sessile polyps in trv colon. complete resection but only partial retrieval. congested mucosa in area 3cm proximal to stoma, erythematous mucosa in Hartmann pouch. repeat tcs 8/2017path-adenomatous polyp   COLONOSCOPY WITH PROPOFOL N/A 12/14/2017   Dr. Jena Gauss: 2 4 to 6 mm polyps in the cecum, tubular adenomas, single rectal polyp tubular adenoma, nonbleeding internal hemorrhoids.  Appears to be a surgical clip at the appendiceal orifice.  Next colonoscopy in 5 years   COLONOSCOPY WITH PROPOFOL N/A 12/18/2022   Procedure: COLONOSCOPY WITH PROPOFOL;  Surgeon: Corbin Ade, MD;  [facility];  Service: Endoscopy;  Laterality: N/A;  8:45 am   ESOPHAGEAL MANOMETRY N/A 12/11/2021   Procedure: ESOPHAGEAL MANOMETRY (EM);  Surgeon: Napoleon Form, MD;  [facility];  Service: Endoscopy;  Laterality: N/A;   ESOPHAGOGASTRODUODENOSCOPY  03/05/2012   erosive reflux esophagitis, bulbar erosions, gastric erosions/petichae, bx showed gastritis, no H.Pylori. Procedure: ESOPHAGOGASTRODUODENOSCOPY (EGD);  Surgeon: Corbin Ade, MD;  [facility];  Service: Endoscopy;  Laterality: N/A;  11:30  ESOPHAGOGASTRODUODENOSCOPY (EGD) WITH PROPOFOL N/A 12/14/2017   Dr. Jena Gauss: Erosive reflux esophagitis with questionable mild stricturing/non-obstruction at this level status post dilation.  multiple gastric erosions in the stomach biopsy showed reactive gastropathy but no H. pylori.   ESOPHAGOGASTRODUODENOSCOPY (EGD) WITH PROPOFOL N/A 10/13/2019   negative Barrett's, normal stomach   ESOPHAGOGASTRODUODENOSCOPY (EGD) WITH PROPOFOL N/A 05/20/2021   inlet patch with superimposed nodule s/p biopsy. Normal esophagus s/p  dilation. Normal gastric mucosa. Normal duodenum. Reflux changes on path.   ESOPHAGOGASTRODUODENOSCOPY (EGD) WITH PROPOFOL N/A 08/04/2022   Procedure: ESOPHAGOGASTRODUODENOSCOPY (EGD) WITH PROPOFOL;  Surgeon: Lanelle Bal, DO;  [facility];  Service: Endoscopy;  Laterality: N/A;  1:15 pm   ESOPHAGOGASTRODUODENOSCOPY (EGD) WITH PROPOFOL N/A 12/18/2022   Procedure: ESOPHAGOGASTRODUODENOSCOPY (EGD) WITH PROPOFOL;  Surgeon: Corbin Ade, MD;  [facility];  Service: Endoscopy;  Laterality: N/A;   FOOT SURGERY Left    strethching of ligaments   HERNIA REPAIR     umbilical   KNEE SURGERY Right 2006   MALONEY DILATION N/A 12/14/2017   Procedure: Elease Hashimoto DILATION;  Surgeon: Corbin Ade, MD;  [facility];  Service: Endoscopy;  Laterality: N/A;   MALONEY DILATION  05/20/2021   Procedure: Elease Hashimoto DILATION;  Surgeon: Corbin Ade, MD;  [facility];  Service: Endoscopy;;   MALONEY DILATION N/A 12/18/2022   Procedure: Alvy Beal;  Surgeon: Corbin Ade, MD;  [facility];  Service: Endoscopy;  Laterality: N/A;   MULTIPLE EXTRACTIONS WITH ALVEOLOPLASTY  02/23/2012   Procedure: MULTIPLE EXTRACION WITH ALVEOLOPLASTY;  Surgeon: Georgia Lopes, DDS;  [facility];  Service: Oral Surgery;  Laterality: Bilateral;  Removal of left mandibular torus, multiple extractions with alveoloplasty   POLYPECTOMY  12/14/2017   Procedure: POLYPECTOMY;  Surgeon: Corbin Ade, MD;  [facility];  Service: Endoscopy;;   POLYPECTOMY  12/18/2022   Procedure: POLYPECTOMY;  Surgeon: Corbin Ade, MD;  [facility];  Service: Endoscopy;;   reversal of colostomy  10/2011   per patient, did not have report   right knee surgery     sigmoid colectomy with end colostomy, Hartmann's pouch  08/2010   [facility]. complicated diverticulitis   UMBILICAL HERNIA REPAIR      Allergies  Allergen Reactions   Codeine Itching and Nausea  And Vomiting   Latex Other (See Comments)    Turns skin red.    Morphine Hives   Zestril [Lisinopril]     Caused Kidney failure   Adhesive [Tape] Rash     Physical Exam: General: The patient is alert and oriented x3 in no acute distress.  Dermatology: Skin is warm, dry and supple bilateral lower extremities.   Vascular: Palpable pedal pulses bilaterally. Capillary refill within normal limits.  No appreciable edema.  No erythema.  Neurological: Grossly intact via light touch  Musculoskeletal Exam: No pedal deformities noted.  Pain on palpation noted most severe along the lateral aspect of the left ankle joint.  Muscle strength 5/5 all compartments  Radiographic Exam LT foot and ankle 01/05/2023:  Normal osseous mineralization. Joint spaces preserved.  No fractures or osseous irregularities noted.  Assessment/Plan of Care: 1.  Ankle sprain/capsulitis left 2.  History of peroneal tendon repair w/ EPF LT.  Surgeon: Dr. Al Corpus.  DOS: 11/15/2021  -Patient evaluated.  X-rays reviewed -Injection of 0.5 cc Celestone Soluspan injected into the lateral aspect of the left ankle -Immobilization cam boot dispensed.  WBAT -Prescription for  meloxicam 15 mg daily -Return to clinic 4 weeks      Felecia Shelling, DPM [facility]  Dr. Felecia Shelling, DPM    [address] [place], [place] [address]                Office [phone]  Fax [phone]

## 2023-01-06 ENCOUNTER — Other Ambulatory Visit: Payer: Self-pay | Admitting: Podiatry

## 2023-01-06 DIAGNOSIS — M79672 Pain in left foot: Secondary | ICD-10-CM

## 2023-01-06 DIAGNOSIS — S93402A Sprain of unspecified ligament of left ankle, initial encounter: Secondary | ICD-10-CM

## 2023-01-06 DIAGNOSIS — M7752 Other enthesopathy of left foot: Secondary | ICD-10-CM

## 2023-02-04 ENCOUNTER — Ambulatory Visit (INDEPENDENT_AMBULATORY_CARE_PROVIDER_SITE_OTHER): Payer: Commercial Managed Care - HMO | Admitting: Podiatry

## 2023-02-04 DIAGNOSIS — M722 Plantar fascial fibromatosis: Secondary | ICD-10-CM | POA: Diagnosis not present

## 2023-02-04 DIAGNOSIS — S93402D Sprain of unspecified ligament of left ankle, subsequent encounter: Secondary | ICD-10-CM

## 2023-02-04 MED ORDER — BETAMETHASONE SOD PHOS & ACET 6 (3-3) MG/ML IJ SUSP
3.0000 mg | Freq: Once | INTRAMUSCULAR | Status: AC
Start: 2023-02-04 — End: 2023-02-04
  Administered 2023-02-04: 3 mg via INTRA_ARTICULAR

## 2023-02-04 NOTE — Progress Notes (Signed)
Chief Complaint  Patient presents with   Foot Pain    Patient came in today for left foot sprain, follow-up, still having swelling, rate of pain 6 out of 10, ankle and arch and heel,    HPI: 57 y.o. male presenting today for follow-up evaluation of an injury that occurred to the patient's left ankle Patient does have a history of peroneal tendon repair and endoscopic plantar fasciotomy approximately 1 year ago.  Patient states that his ankle is feeling much better but he is now experiencing pain and tenderness associated to the bilateral heels.  He has a history of plantar fasciitis.  Past Medical History:  Diagnosis Date   Acute pancreatitis    Anxiety    Arthritis    Colonic adenoma    COPD (chronic obstructive pulmonary disease) (HCC)    Depression    Diverticulitis of colon with perforation 08/2010   GERD (gastroesophageal reflux disease)    History of hiatal hernia    Hypertension    Polysubstance abuse (HCC)    Renal insufficiency    Sleep apnea    Intolerant of CPAP   Stroke (HCC)    TIA (transient ischemic attack)     Past Surgical History:  Procedure Laterality Date   APPENDECTOMY     BALLOON DILATION N/A 08/04/2022   Procedure: BALLOON DILATION;  Surgeon: Lanelle Bal, DO;  [facility];  Service: Endoscopy;  Laterality: N/A;   BIOPSY  12/14/2017   Procedure: BIOPSY;  Surgeon: Corbin Ade, MD;  [facility];  Service: Endoscopy;;  gastric    BIOPSY  10/13/2019   Procedure: BIOPSY;  Surgeon: Corbin Ade, MD;  [facility];  Service: Endoscopy;;  esophagus   BIOPSY  05/20/2021   Procedure: BIOPSY;  Surgeon: Corbin Ade, MD;  [facility];  Service: Endoscopy;;   BIOPSY  12/18/2022   Procedure: BIOPSY;  Surgeon: Corbin Ade, MD;  [facility];  Service: Endoscopy;;   clamp left in colon after abd surgery     COLON SURGERY  05/2011   [facility]. exp laparotomy with colostomy takedown, lysis  of adhesions (3hours), loop ileostomy. complicated by abscess formation requiring percutaneous drainage   colonoscopy via stoma with endoscopy of Sammuel Hines  04/2011   [facility]. two sessile polyps in trv colon. complete resection but only partial retrieval. congested mucosa in area 3cm proximal to stoma, erythematous mucosa in Hartmann pouch. repeat tcs 8/2017path-adenomatous polyp   COLONOSCOPY WITH PROPOFOL N/A 12/14/2017   Dr. Jena Gauss: 2 4 to 6 mm polyps in the cecum, tubular adenomas, single rectal polyp tubular adenoma, nonbleeding internal hemorrhoids.  Appears to be a surgical clip at the appendiceal orifice.  Next colonoscopy in 5 years   COLONOSCOPY WITH PROPOFOL N/A 12/18/2022   Procedure: COLONOSCOPY WITH PROPOFOL;  Surgeon: Corbin Ade, MD;  [facility];  Service: Endoscopy;  Laterality: N/A;  8:45 am   ESOPHAGEAL MANOMETRY N/A 12/11/2021   Procedure: ESOPHAGEAL MANOMETRY (EM);  Surgeon: Napoleon Form, MD;  [facility];  Service: Endoscopy;  Laterality: N/A;   ESOPHAGOGASTRODUODENOSCOPY  03/05/2012   erosive reflux esophagitis, bulbar erosions, gastric erosions/petichae, bx showed gastritis, no H.Pylori. Procedure: ESOPHAGOGASTRODUODENOSCOPY (EGD);  Surgeon: Corbin Ade, MD;  [facility];  Service: Endoscopy;  Laterality: N/A;  11:30   ESOPHAGOGASTRODUODENOSCOPY (EGD) WITH PROPOFOL N/A 12/14/2017   Dr. Jena Gauss: Erosive reflux esophagitis with questionable mild stricturing/non-obstruction at this level status  post dilation.  multiple gastric erosions in the stomach biopsy showed reactive gastropathy but no H. pylori.   ESOPHAGOGASTRODUODENOSCOPY (EGD) WITH PROPOFOL N/A 10/13/2019   negative Barrett's, normal stomach   ESOPHAGOGASTRODUODENOSCOPY (EGD) WITH PROPOFOL N/A 05/20/2021   inlet patch with superimposed nodule s/p biopsy. Normal esophagus s/p dilation. Normal gastric mucosa. Normal duodenum. Reflux changes on path.    ESOPHAGOGASTRODUODENOSCOPY (EGD) WITH PROPOFOL N/A 08/04/2022   Procedure: ESOPHAGOGASTRODUODENOSCOPY (EGD) WITH PROPOFOL;  Surgeon: Lanelle Bal, DO;  [facility];  Service: Endoscopy;  Laterality: N/A;  1:15 pm   ESOPHAGOGASTRODUODENOSCOPY (EGD) WITH PROPOFOL N/A 12/18/2022   Procedure: ESOPHAGOGASTRODUODENOSCOPY (EGD) WITH PROPOFOL;  Surgeon: Corbin Ade, MD;  [facility];  Service: Endoscopy;  Laterality: N/A;   FOOT SURGERY Left    strethching of ligaments   HERNIA REPAIR     umbilical   KNEE SURGERY Right 2006   MALONEY DILATION N/A 12/14/2017   Procedure: Elease Hashimoto DILATION;  Surgeon: Corbin Ade, MD;  [facility];  Service: Endoscopy;  Laterality: N/A;   MALONEY DILATION  05/20/2021   Procedure: Elease Hashimoto DILATION;  Surgeon: Corbin Ade, MD;  [facility];  Service: Endoscopy;;   MALONEY DILATION N/A 12/18/2022   Procedure: Alvy Beal;  Surgeon: Corbin Ade, MD;  [facility];  Service: Endoscopy;  Laterality: N/A;   MULTIPLE EXTRACTIONS WITH ALVEOLOPLASTY  02/23/2012   Procedure: MULTIPLE EXTRACION WITH ALVEOLOPLASTY;  Surgeon: Georgia Lopes, DDS;  Location: [facility];  Service: Oral Surgery;  Laterality: Bilateral;  Removal of left mandibular torus, multiple extractions with alveoloplasty   POLYPECTOMY  12/14/2017   Procedure: POLYPECTOMY;  Surgeon: Corbin Ade, MD;  [facility];  Service: Endoscopy;;   POLYPECTOMY  12/18/2022   Procedure: POLYPECTOMY;  Surgeon: Corbin Ade, MD;  [facility];  Service: Endoscopy;;   reversal of colostomy  10/2011   per patient, did not have report   right knee surgery     sigmoid colectomy with end colostomy, Hartmann's pouch  08/2010   [facility]. complicated diverticulitis   UMBILICAL HERNIA REPAIR      Allergies  Allergen Reactions   Codeine Itching and Nausea And Vomiting   Latex Other (See Comments)    Turns skin red.    Morphine  Hives   Zestril [Lisinopril]     Caused Kidney failure   Adhesive [Tape] Rash     Physical Exam: General: The patient is alert and oriented x3 in no acute distress.  Dermatology: Skin is warm, dry and supple bilateral lower extremities.   Vascular: Palpable pedal pulses bilaterally. Capillary refill within normal limits.  No appreciable edema.  No erythema.  Neurological: Grossly intact via light touch  Musculoskeletal Exam: No pedal deformities noted.  The pain over the lateral aspect of the left ankle appears to be improved significantly.  Today he is having pain and tenderness to both plantar fascia and heels bilateral.  Radiographic Exam LT foot and ankle 01/05/2023:  Normal osseous mineralization. Joint spaces preserved.  No fractures or osseous irregularities noted.  Assessment/Plan of Care: 1.  Ankle sprain/capsulitis left 2.  History of peroneal tendon repair w/ EPF LT.  Surgeon: Dr. Al Corpus.  DOS: 11/15/2021 3.  Plantar fasciitis bilateral  -Patient evaluated.  X-rays reviewed -Injection of 0.5 cc Celestone Soluspan injected into the bilateral plantar fascia - Continue WBAT cam boot.  Patient may slowly transition out of the cam boot into good supportive  tennis shoes and sneakers - Continue meloxicam 15 mg daily -Compression ankle sleeve dispensed.  Wear daily  -Return to clinic 4 weeks      Felecia Shelling, DPM [facility]  Dr. Felecia Shelling, DPM    [address] [place], [place] [address]                Office [phone]  Fax [phone]

## 2023-02-18 DIAGNOSIS — R079 Chest pain, unspecified: Secondary | ICD-10-CM | POA: Diagnosis not present

## 2023-02-18 DIAGNOSIS — E860 Dehydration: Secondary | ICD-10-CM | POA: Diagnosis not present

## 2023-02-18 DIAGNOSIS — R519 Headache, unspecified: Secondary | ICD-10-CM | POA: Diagnosis not present

## 2023-02-18 DIAGNOSIS — E861 Hypovolemia: Secondary | ICD-10-CM | POA: Diagnosis not present

## 2023-02-18 DIAGNOSIS — R911 Solitary pulmonary nodule: Secondary | ICD-10-CM | POA: Diagnosis not present

## 2023-02-23 ENCOUNTER — Ambulatory Visit: Payer: Commercial Managed Care - HMO

## 2023-02-23 VITALS — BP 132/81 | HR 76 | Ht 67.0 in | Wt 212.6 lb

## 2023-02-23 DIAGNOSIS — J449 Chronic obstructive pulmonary disease, unspecified: Secondary | ICD-10-CM

## 2023-02-23 DIAGNOSIS — F1111 Opioid abuse, in remission: Secondary | ICD-10-CM

## 2023-02-23 DIAGNOSIS — I2089 Other forms of angina pectoris: Secondary | ICD-10-CM

## 2023-02-23 DIAGNOSIS — F1110 Opioid abuse, uncomplicated: Secondary | ICD-10-CM

## 2023-02-23 DIAGNOSIS — G4730 Sleep apnea, unspecified: Secondary | ICD-10-CM

## 2023-02-23 DIAGNOSIS — G4733 Obstructive sleep apnea (adult) (pediatric): Secondary | ICD-10-CM | POA: Insufficient documentation

## 2023-02-23 DIAGNOSIS — R918 Other nonspecific abnormal finding of lung field: Secondary | ICD-10-CM

## 2023-02-23 MED ORDER — BUPRENORPHINE HCL-NALOXONE HCL 8-2 MG SL FILM
ORAL_FILM | SUBLINGUAL | 2 refills | Status: DC
Start: 2023-02-23 — End: 2023-06-01

## 2023-02-23 MED ORDER — ALBUTEROL SULFATE HFA 108 (90 BASE) MCG/ACT IN AERS
2.0000 | INHALATION_SPRAY | Freq: Four times a day (QID) | RESPIRATORY_TRACT | 2 refills | Status: DC | PRN
Start: 2023-02-23 — End: 2023-08-14

## 2023-02-23 NOTE — Assessment & Plan Note (Addendum)
Patient with incidental lung nodules found on CT chest at recent ED visit for chest pain.  Findings were suggestive of inflammatory process though malignancy cannot be ruled out.  He has a significant smoking history.  Low-dose screening chest CT last year was negative.  Denies any chest pain or shortness of breath today.  Lung sounds normal on auscultation.  Satting well on room air. -Follow-up with noncontrast chest CT in 3 months

## 2023-02-23 NOTE — Assessment & Plan Note (Signed)
Patient presents after recent ED visit for worsening chest pain at rest.  His head also hurt and his left arm was throbbing.  He denies any chest pain today.  His ECG was nonischemic and his tropes were flat at ED visit and provider had low suspicion for ACS so patient was discharged outpatient follow-up.   -Referral for stress echo

## 2023-02-23 NOTE — Assessment & Plan Note (Signed)
Patient presents with history of sleep apnea.  STOP-BANG score 6 today.  Endorses loud snoring, apneic episodes during the night, fatigue during the day.  He previously had a CPAP machine but does not have one currently. -Referral to sleep center

## 2023-02-23 NOTE — Progress Notes (Signed)
CC: OUD follow-up  HPI:  [name] is a 57 y.o. male with past medical history as below who presents for suboxone follow up. Please see detailed assessment and plan for HPI.  Past Medical History:  Diagnosis Date   Acute pancreatitis    Anxiety    Arthritis    Colonic adenoma    COPD (chronic obstructive pulmonary disease) (HCC)    Depression    Diverticulitis of colon with perforation 08/2010   GERD (gastroesophageal reflux disease)    History of hiatal hernia    Hypertension    Polysubstance abuse (HCC)    Renal insufficiency    Sleep apnea    Intolerant of CPAP   Stroke (HCC)    TIA (transient ischemic attack)    Review of Systems:  Please see detailed assessment and plan for pertinent ROS.  Physical Exam:  Vitals:   02/23/23 1001  BP: 132/81  Pulse: 76  SpO2: 95%  Weight: 212 lb 9.6 oz (96.4 kg)  Height: 5\' 7"  (1.702 m)   Physical Exam Constitutional:      General: He is not in acute distress. HENT:     Head: Normocephalic and atraumatic.  Eyes:     Extraocular Movements: Extraocular movements intact.  Cardiovascular:     Rate and Rhythm: Normal rate and regular rhythm.     Heart sounds: No murmur heard. Pulmonary:     Effort: Pulmonary effort is normal.     Breath sounds: No wheezing or rales.  Musculoskeletal:     Cervical back: Neck supple.     Right lower leg: No edema.     Left lower leg: No edema.  Skin:    General: Skin is warm and dry.  Neurological:     Mental Status: He is alert and oriented to person, place, and time.  Psychiatric:        Mood and Affect: Mood normal.        Behavior: Behavior normal.      Assessment & Plan:   See Encounters Tab for problem based charting.  Opioid use disorder, mild, in sustained remission, on maintenance therapy, abuse [facility]) Patient presents for follow-up of opioid use disorder managed with Suboxone.  UDS 3 months ago was positive for alprazolam and buprenorphine.  Also positive for  THC and duloxetine.  Patient states he gets Xanax from his aunt and takes this for anxiety and also for blood pressure control.  I discussed that we do not recommend this.  He tried duloxetine for a week but states made him feel weird and did not wish to continue.  Denies cravings or relapse today. -Continue buprenorphine-naloxone 8-2 mg -Return in 3 months  Angina at rest Patient presents after recent ED visit for worsening chest pain at rest.  His head also hurt and his left arm was throbbing.  He denies any chest pain today.  His ECG was nonischemic and his tropes were flat at ED visit and provider had low suspicion for ACS so patient was discharged outpatient follow-up.   -Referral for stress echo  Sleep apnea Patient presents with history of sleep apnea.  STOP-BANG score 6 today.  Endorses loud snoring, apneic episodes during the night, fatigue during the day.  He previously had a CPAP machine but does not have one currently. -Referral to sleep center  Lung nodules Patient with incidental lung nodules found on CT chest at recent ED visit for chest pain.  Findings were suggestive of inflammatory process though  malignancy cannot be ruled out.  He has a significant smoking history.  Low-dose screening chest CT last year was negative.  Denies any chest pain or shortness of breath today.  Lung sounds normal on auscultation.  Satting well on room air. -Follow-up with noncontrast chest CT in 3 months  Patient discussed with Dr. Antony Contras

## 2023-02-23 NOTE — Patient Instructions (Addendum)
[name], it was a pleasure seeing you today!  Today we discussed: Lung spots - follow up scan in 3 months Stress testing - Referral sent for cardiologist in [place]. Sleep Center - Referral sent for sleep specialist. Hand pain - try spica splint that covers all of your wrist and thumb on each hand at night. Buprenorphine - continue taking. See you back in 3 months. Try to avoid taking Xanax.  I have ordered the following labs today:  Lab Orders  No laboratory test(s) ordered today     Tests ordered today:  none  Referrals ordered today:   Referral Orders         Ambulatory referral to Cardiology         Ambulatory referral to Pulmonology       I have ordered the following medication/changed the following medications:   Stop the following medications: Medications Discontinued During This Encounter  Medication Reason   albuterol (PROVENTIL HFA;VENTOLIN HFA) 108 (90 Base) MCG/ACT inhaler Reorder   Buprenorphine HCl-Naloxone HCl 8-2 MG FILM Reorder     Start the following medications: Meds ordered this encounter  Medications   albuterol (VENTOLIN HFA) 108 (90 Base) MCG/ACT inhaler    Sig: Inhale 2 puffs into the lungs every 6 (six) hours as needed for wheezing or shortness of breath.    Dispense:  18 g    Refill:  2   Buprenorphine HCl-Naloxone HCl 8-2 MG FILM    Sig: PLACE 1 STRIP UNDER THE TONGUE TWICE DAILY    Dispense:  60 each    Refill:  2     Follow-up:  3 months    Please make sure to arrive 15 minutes prior to your next appointment. If you arrive late, you may be asked to reschedule.   We look forward to seeing you next time. Please call our clinic at [phone] if you have any questions or concerns. The best time to call is Monday-Friday from 9am-4pm, but there is someone available 24/7. If after hours or the weekend, call the main hospital number and ask for the Internal Medicine Resident On-Call. If you need medication refills, please notify your  pharmacy one week in advance and they will send [place] a request.  Thank you for letting us take part in your care. Wishing you the best!  Thank you, Adron Bene, MD

## 2023-02-23 NOTE — Assessment & Plan Note (Signed)
Patient presents for follow-up of opioid use disorder managed with Suboxone.  UDS 3 months ago was positive for alprazolam and buprenorphine.  Also positive for THC and duloxetine.  Patient states he gets Xanax from his aunt and takes this for anxiety and also for blood pressure control.  I discussed that we do not recommend this.  He tried duloxetine for a week but states made him feel weird and did not wish to continue.  Denies cravings or relapse today. -Continue buprenorphine-naloxone 8-2 mg -Return in 3 months

## 2023-02-26 NOTE — Progress Notes (Signed)
Internal Medicine Clinic Attending  Case discussed with Dr. White  At the time of the visit.  We reviewed the resident's history and exam and pertinent patient test results.  I agree with the assessment, diagnosis, and plan of care documented in the resident's note.  

## 2023-03-18 ENCOUNTER — Ambulatory Visit: Payer: Commercial Managed Care - HMO | Admitting: Podiatry

## 2023-03-24 DIAGNOSIS — M25562 Pain in left knee: Secondary | ICD-10-CM | POA: Diagnosis not present

## 2023-03-27 ENCOUNTER — Telehealth: Payer: Self-pay | Admitting: Nurse Practitioner

## 2023-03-27 ENCOUNTER — Ambulatory Visit: Payer: Commercial Managed Care - HMO | Attending: Nurse Practitioner | Admitting: Nurse Practitioner

## 2023-03-27 ENCOUNTER — Encounter: Payer: Self-pay | Admitting: Nurse Practitioner

## 2023-03-27 VITALS — BP 148/98 | HR 72 | Ht 67.0 in | Wt 208.2 lb

## 2023-03-27 DIAGNOSIS — R7989 Other specified abnormal findings of blood chemistry: Secondary | ICD-10-CM

## 2023-03-27 DIAGNOSIS — R079 Chest pain, unspecified: Secondary | ICD-10-CM | POA: Diagnosis not present

## 2023-03-27 DIAGNOSIS — E782 Mixed hyperlipidemia: Secondary | ICD-10-CM

## 2023-03-27 DIAGNOSIS — N179 Acute kidney failure, unspecified: Secondary | ICD-10-CM

## 2023-03-27 DIAGNOSIS — I25119 Atherosclerotic heart disease of native coronary artery with unspecified angina pectoris: Secondary | ICD-10-CM

## 2023-03-27 DIAGNOSIS — E785 Hyperlipidemia, unspecified: Secondary | ICD-10-CM

## 2023-03-27 DIAGNOSIS — I700 Atherosclerosis of aorta: Secondary | ICD-10-CM

## 2023-03-27 DIAGNOSIS — I10 Essential (primary) hypertension: Secondary | ICD-10-CM

## 2023-03-27 DIAGNOSIS — F1910 Other psychoactive substance abuse, uncomplicated: Secondary | ICD-10-CM

## 2023-03-27 DIAGNOSIS — R918 Other nonspecific abnormal finding of lung field: Secondary | ICD-10-CM

## 2023-03-27 DIAGNOSIS — Z8673 Personal history of transient ischemic attack (TIA), and cerebral infarction without residual deficits: Secondary | ICD-10-CM

## 2023-03-27 NOTE — Patient Instructions (Addendum)
Medication Instructions:  Your physician has recommended you make the following change in your medication:  Stop Sildenafil Citrate 100 mg Continue all other medications as prescribed  Labwork: Lipids CMET Non-fasting all to be done at [facility]  Testing/Procedures: Your physician has requested that you have a lexiscan myoview. For further information please visit [email or web address] Please follow instruction sheet, as given.  Your physician has requested that you have an echocardiogram. Echocardiography is a painless test that uses sound waves to create images of your heart. It provides your doctor with information about the size and shape of your heart and how well your heart's chambers and valves are working. This procedure takes approximately one hour. There are no restrictions for this procedure. Please do NOT wear cologne, perfume, aftershave, or lotions (deodorant is allowed). Please arrive 15 minutes prior to your appointment time.   Follow-Up: Your physician recommends that you schedule a follow-up appointment in: 6 weeks  Any Other Special Instructions Will Be Listed Below (If Applicable).   Blood Pressure Record Sheet To take your blood pressure, you will need a blood pressure machine. You may be prescribed one, or you can buy a blood pressure machine (blood pressure monitor) at your clinic, drug store, or online. When choosing one, look for these features: An automatic monitor that has an arm cuff. A cuff that wraps snugly, but not too tightly, around your upper arm. You should be able to fit only one finger between your arm and the cuff. A device that stores blood pressure reading results. Do not choose a monitor that measures your blood pressure from your wrist or finger. Follow your health care provider's instructions for how to take your blood pressure. To use this form: Get one reading in the morning (a.m.) before you take any medicines. Get one reading in the evening  (p.m.) before supper. Take at least two readings with each blood pressure check. This makes sure the results are correct. Wait 1-2 minutes between measurements. Write down the results in the spaces on this form. Repeat this once a week, or as told by your health care provider. Make a follow-up appointment with your health care provider to discuss the results. Blood pressure log Date: _______________________ a.m. _____________________(1st reading) _____________________(2nd reading) p.m. _____________________(1st reading) _____________________(2nd reading) Date: _______________________ a.m. _____________________(1st reading) _____________________(2nd reading) p.m. _____________________(1st reading) _____________________(2nd reading) Date: _______________________ a.m. _____________________(1st reading) _____________________(2nd reading) p.m. _____________________(1st reading) _____________________(2nd reading) Date: _______________________ a.m. _____________________(1st reading) _____________________(2nd reading) p.m. _____________________(1st reading) _____________________(2nd reading) Date: _______________________ a.m. _____________________(1st reading) _____________________(2nd reading) p.m. _____________________(1st reading) _____________________(2nd reading) This information is not intended to replace advice given to you by your health care provider. Make sure you discuss any questions you have with your health care provider. Document Revised: 05/09/2021 Document Reviewed: 05/09/2021 Elsevier Patient Education  2024 [employer].   If you need a refill on your cardiac medications before your next appointment, please call your pharmacy.

## 2023-03-27 NOTE — Progress Notes (Unsigned)
Cardiology Office Note:  .   Date:  03/27/2023 ID:  [name], DOB 03/22/1966, MRN [id number] PCP: Jason Bolster, MD  [facility] Providers Cardiologist:  Nona Dell, MD {  History of Present Illness: .   [name] is a 57 y.o. male with a PMH of coronary and aortic atherosclerosis, chest pain, mixed HLD, polysubstance abuse, HTN, HLD, COPD, sleep apnea (intolerant to CPAP), hx of CVA in 2022 (self-reported), pulmonary nodules, and depression, who presents today for chest pain evaluation.   Last seen by Dr. Diona Oneal on November 21, 2020. Was referred for evaluation of chest discomfort. Lexiscan Myoview was obtained and was normal.   More recently, he had an ED visit on February 18, 2023 for intermitent epigastric chest discomfort at [facility]. Described more CP at rest, described as burning sensation at center of his chest. CTA of chest revealed no acute PE with evidence of new small solid nodules medially in left lower lobe, hepatic steatosis. CXR showed patchy opacities in LLL. UDS positive for Marijuana and benzos. Trops negative.   Today he presents for follow-up. States CP started one month ago. Describes it as "sharp/indigestion," right-sided, occurs more with exertion, lasts for a few minutes, and sits down and gets better, noticed with working outside. Rates 7-8/10 when it occurs. Denies any shortness of breath, palpitations, syncope, presyncope, dizziness, orthopnea, PND, swelling or significant weight changes, acute bleeding, or claudication. States he is not on any oxycodone or cocaine.   SH: Occasionally uses marijuana and smokes 1 PPD, trying to quit. Denies any alcohol use.    Studies Reviewed: Marland Kitchen    Lexiscan 11/2020:  No diagnostic ST segment changes to indicate ischemia. No significant myocardial perfusion defects on stress imaging to indicate scar or ischemia. There is diaphragmatic attenuation noted, mainly on the rest images. This is a low risk  study. Nuclear stress EF: 63%.  Echo 04/2020:  1. Left ventricular ejection fraction, by estimation, is 65 to 70%. The  left ventricle has normal function. The left ventricle has no regional  wall motion abnormalities. There is moderate left ventricular hypertrophy.  Left ventricular diastolic  parameters were normal.   2. Right ventricular systolic function is normal. The right ventricular  size is normal. Tricuspid regurgitation signal is inadequate for assessing  PA pressure.   3. The mitral valve is grossly normal. Trivial mitral valve  regurgitation.   4. The aortic valve is tricuspid. Aortic valve regurgitation is not  visualized.   5. The inferior vena cava is normal in size with greater than 50%  respiratory variability, suggesting right atrial pressure of 3 mmHg.  Physical Exam:   VS:  BP (!) 148/98 (BP Location: Right Arm)   Pulse 72   Ht 5\' 7"  (1.702 m)   Wt 208 lb 3.2 oz (94.4 kg)   SpO2 98%   BMI 32.61 kg/m    Wt Readings from Last 3 Encounters:  03/27/23 208 lb 3.2 oz (94.4 kg)  02/23/23 212 lb 9.6 oz (96.4 kg)  12/23/22 208 lb (94.3 kg)    Repeat BP: 142/95  GEN: Obese, 57 y.o. male in no acute distress NECK: No JVD; No carotid bruits CARDIAC: S1/S2, RRR, no murmurs, rubs, gallops RESPIRATORY:  Clear to auscultation without rales, wheezing or rhonchi  ABDOMEN: Soft, non-tender, non-distended EXTREMITIES:  No edema; No deformity   ASSESSMENT AND PLAN: .    Chest pain of uncertain etiology, coronary atherosclerosis and aortic atherosclerosis Etiology unclear, does have some mixed  atypical and typical features. Low risk NST in 2023. Will update Echo at this time to evaluate EF and wall motion abnormalities at this time and will update Lexiscan at this time as chest pain is different from last year. Discussed risks vs benefits and he verbalized understanding and is agreeable to proceed. Will obtain CMET and FLP at this time. Plan to discuss starting low dose  Aspirin at next OV unless contraindicated. Heart healthy diet and regular cardiovascular exercise encouraged. ED precautions discussed.   Informed Consent   Shared Decision Making/Informed Consent The risks [chest pain, shortness of breath, cardiac arrhythmias, dizziness, blood pressure fluctuations, myocardial infarction, stroke/transient ischemic attack, nausea, vomiting, allergic reaction, radiation exposure, metallic taste sensation and life-threatening complications (estimated to be 1 in 10,000)], benefits (risk stratification, diagnosing coronary artery disease, treatment guidance) and alternatives of a nuclear stress test were discussed in detail with [name] and he agrees to proceed.  2. HTN BP elevated today. SBP goal < 130. Continue hydrochlorothiazide and Aldactone. Given BP log and salty six diet sheet. Discussed to monitor BP at home at least 2 hours after medications and sitting for 5-10 minutes. Plan to initiate amlodipine at next OV if no improvement by next OV.   3. Mixed HLD Not on any lipid lowering medications. Will obtain labs as mentioned above. Heart healthy diet and regular cardiovascular exercise encouraged.   4. Hx of CVA Denies any symptoms. No medication changes at this time. Plan to start low dose Aspirin at next OV if no contraindications. Heart healthy diet and regular cardiovascular exercise encouraged. Continue to follow with PCP. Getting labs as mentioned above.   5. Pulmonary nodules CT of chest at [facility] 02/2023 showed new small solid nodules medially in left lower lobe, likely to be inflammatory. Was recommended to repeat non-contrast chest CT at 3-6 months. Plan to arrange at next OV.   6. Polysubstance abuse, elevated serum creatinine Substance abuse cessation encouraged and discussed. He verbalized understanding. Last sCr was 1.40 with eGFR at 59, likely dehydrated, repeating labwork as mentioned above. Avoid nephrotoxic agents. Continue to follow with  PCP.  Dispo: Follow-up with me or APP in 6 weeks or sooner if anything changes.    Signed, Sharlene Dory, NP

## 2023-03-27 NOTE — Telephone Encounter (Signed)
Checking percert on the following patient for testing scheduled at [facility].   LEXISCAN 04-03-2023

## 2023-04-03 ENCOUNTER — Encounter (HOSPITAL_COMMUNITY): Payer: Commercial Managed Care - HMO

## 2023-04-08 ENCOUNTER — Ambulatory Visit: Payer: Commercial Managed Care - HMO | Attending: Nurse Practitioner

## 2023-04-08 DIAGNOSIS — R079 Chest pain, unspecified: Secondary | ICD-10-CM

## 2023-04-10 ENCOUNTER — Encounter (HOSPITAL_COMMUNITY)
Admission: RE | Admit: 2023-04-10 | Discharge: 2023-04-10 | Disposition: A | Discharge status: Home / Self Care | Payer: Commercial Managed Care - HMO | Source: Ambulatory Visit | Attending: Nurse Practitioner | Admitting: Nurse Practitioner

## 2023-04-10 ENCOUNTER — Ambulatory Visit (HOSPITAL_COMMUNITY)
Admission: RE | Admit: 2023-04-10 | Discharge: 2023-04-10 | Disposition: A | Discharge status: Home / Self Care | Payer: Commercial Managed Care - HMO | Source: Ambulatory Visit | Attending: Cardiology | Admitting: Cardiology

## 2023-04-10 DIAGNOSIS — R079 Chest pain, unspecified: Secondary | ICD-10-CM | POA: Diagnosis not present

## 2023-04-10 LAB — NM MYOCAR MULTI W/SPECT W/WALL MOTION / EF
LV dias vol: 70 mL (ref 62–150)
LV sys vol: 21 mL
Nuc Stress EF: 70 %
Peak HR: 86 {beats}/min
RATE: 0.4
Rest HR: 60 {beats}/min
Rest Nuclear Isotope Dose: 10.5 mCi
SDS: 4
SRS: 0
SSS: 4
ST Depression (mm): 0 mm
Stress Nuclear Isotope Dose: 30.3 mCi
TID: 1.03

## 2023-04-10 MED ORDER — SODIUM CHLORIDE FLUSH 0.9 % IV SOLN
INTRAVENOUS | Status: AC
  Administered 2023-04-10: 10 mL via INTRAVENOUS
  Filled 2023-04-10: qty 10

## 2023-04-10 MED ORDER — TECHNETIUM TC 99M TETROFOSMIN IV KIT
10.5000 | PACK | Freq: Once | INTRAVENOUS | Status: AC | PRN
  Administered 2023-04-10: 10.5 via INTRAVENOUS

## 2023-04-10 MED ORDER — TECHNETIUM TC 99M TETROFOSMIN IV KIT
30.3000 | PACK | Freq: Once | INTRAVENOUS | Status: AC | PRN
  Administered 2023-04-10: 30.3 via INTRAVENOUS

## 2023-04-10 MED ORDER — REGADENOSON 0.4 MG/5ML IV SOLN
INTRAVENOUS | Status: AC
  Administered 2023-04-10: 0.4 mg via INTRAVENOUS
  Filled 2023-04-10: qty 5

## 2023-04-16 ENCOUNTER — Institutional Professional Consult (permissible substitution): Payer: Commercial Managed Care - HMO | Admitting: Adult Health

## 2023-04-25 LAB — COMPREHENSIVE METABOLIC PANEL
AST: 12 IU/L (ref 0–40)
Total Protein: 7.4 g/dL (ref 6.0–8.5)

## 2023-04-28 ENCOUNTER — Other Ambulatory Visit: Payer: Self-pay | Admitting: Nurse Practitioner

## 2023-04-28 DIAGNOSIS — Z79899 Other long term (current) drug therapy: Secondary | ICD-10-CM

## 2023-04-28 MED ORDER — ROSUVASTATIN CALCIUM 20 MG PO TABS: 20.0000 mg | ORAL_TABLET | Freq: Every day | ORAL | 1 refills | Status: DC

## 2023-05-01 ENCOUNTER — Ambulatory Visit
Admission: EM | Admit: 2023-05-01 | Discharge: 2023-05-01 | Disposition: A | Discharge status: Home / Self Care | Payer: Commercial Managed Care - HMO | Attending: Family Medicine | Admitting: Family Medicine

## 2023-05-01 ENCOUNTER — Encounter: Payer: Self-pay | Admitting: Emergency Medicine

## 2023-05-01 DIAGNOSIS — M545 Low back pain, unspecified: Secondary | ICD-10-CM | POA: Diagnosis not present

## 2023-05-01 DIAGNOSIS — W19XXXA Unspecified fall, initial encounter: Secondary | ICD-10-CM | POA: Diagnosis not present

## 2023-05-01 DIAGNOSIS — S80819A Abrasion, unspecified lower leg, initial encounter: Secondary | ICD-10-CM

## 2023-05-01 DIAGNOSIS — M79604 Pain in right leg: Secondary | ICD-10-CM

## 2023-05-01 DIAGNOSIS — M79605 Pain in left leg: Secondary | ICD-10-CM

## 2023-05-01 MED ORDER — KETOROLAC TROMETHAMINE 30 MG/ML IJ SOLN
30.0000 mg | Freq: Once | INTRAMUSCULAR | Status: AC
  Administered 2023-05-01: 30 mg via INTRAMUSCULAR

## 2023-05-01 NOTE — ED Triage Notes (Signed)
Larey Seat off ladder today about 4 to 5 feet and both legs and feet got tangled up in ladder.  Abrasion areas to both lower legs and area below left knee.

## 2023-05-01 NOTE — ED Triage Notes (Signed)
Also c/o lower back pain in right side.

## 2023-05-01 NOTE — Discharge Instructions (Signed)
Keep the skin wound clean with gentle soap and water, apply Neosporin and dressings to the area to keep them from getting infected.  Ice, elevation to the swollen painful areas to the legs from the fall.  We have given you a shot of anti-inflammatory pain medication today, you may also take Tylenol as needed for pain.  Follow-up for any worsening symptoms.

## 2023-05-01 NOTE — ED Provider Notes (Signed)
[facility]    CSN: [id number] Arrival date & time: 05/01/23  1040      History   Chief Complaint No chief complaint on file.   HPI [name] is a 57 y.o. male.   Patient presenting today for evaluation of a fall that occurred about 30 minutes prior to arrival.  States he fell off of about 4 feet of ladder when his feet got tangled up in the ladder.  Did not hit his head and denies syncope, dizziness, vomiting, visual change, mental status change.  Mainly hit his lower legs and having immediate bruising and swelling, abrasions to these areas and having pain in his low back diffusely.  Denies loss of range of motion, numbness, tingling, weakness, bowel or bladder incontinence, saddle anesthesias.  So far not trying anything for symptoms.  Not on any anticoagulation at this time.    Past Medical History:  Diagnosis Date   Acute pancreatitis    Anxiety    Arthritis    Colonic adenoma    COPD (chronic obstructive pulmonary disease) (HCC)    Depression    Diverticulitis of colon with perforation 08/2010   GERD (gastroesophageal reflux disease)    History of hiatal hernia    Hypertension    Polysubstance abuse (HCC)    Renal insufficiency    Sleep apnea    Intolerant of CPAP   Stroke [facility])    TIA (transient ischemic attack)     Patient Active Problem List   Diagnosis Date Noted   Angina at rest 02/23/2023   Sleep apnea 02/23/2023   Lung nodules 02/23/2023   Constipation 11/28/2022   Carpal tunnel syndrome on both sides 11/24/2022   Heel pain, bilateral 07/29/2021   Healthcare maintenance 07/29/2021   Gout 05/24/2021   Epidermoid cyst of skin 02/18/2021   Erectile disorder due to medical condition in male 01/10/2021   Left knee pain 12/11/2020   Vitamin D deficiency 10/12/2020   Opioid use disorder, mild, in sustained remission, on maintenance therapy, abuse (HCC) 08/16/2020   Dizziness 05/23/2020   Cervical radiculopathy    Tobacco abuse     Hyperlipidemia    Hemorrhoids 12/06/2019   Chronic obstructive pulmonary disease (HCC) 12/14/2017   Dysphagia 09/24/2017   History of colonic polyps 09/24/2017   GERD (gastroesophageal reflux disease) 03/15/2012   Depression 10/05/2009   HTN (hypertension) 10/05/2009    Past Surgical History:  Procedure Laterality Date   APPENDECTOMY     BALLOON DILATION N/A 08/04/2022   Procedure: BALLOON DILATION;  Surgeon: Lanelle Bal, DO;  [facility];  Service: Endoscopy;  Laterality: N/A;   BIOPSY  12/14/2017   Procedure: BIOPSY;  Surgeon: Corbin Ade, MD;  [facility];  Service: Endoscopy;;  gastric    BIOPSY  10/13/2019   Procedure: BIOPSY;  Surgeon: Corbin Ade, MD;  [facility];  Service: Endoscopy;;  esophagus   BIOPSY  05/20/2021   Procedure: BIOPSY;  Surgeon: Corbin Ade, MD;  [facility];  Service: Endoscopy;;   BIOPSY  12/18/2022   Procedure: BIOPSY;  Surgeon: Corbin Ade, MD;  [facility];  Service: Endoscopy;;   clamp left in colon after abd surgery     COLON SURGERY  05/2011   [facility]. exp laparotomy with colostomy takedown, lysis of adhesions (3hours), loop ileostomy. complicated by abscess formation requiring percutaneous drainage   colonoscopy via stoma with endoscopy of Sammuel Hines  04/2011  [facility]. two sessile polyps in trv colon. complete resection but only partial retrieval. congested mucosa in area 3cm proximal to stoma, erythematous mucosa in Hartmann pouch. repeat tcs 8/2017path-adenomatous polyp   COLONOSCOPY WITH PROPOFOL N/A 12/14/2017   Dr. Jena Gauss: 2 4 to 6 mm polyps in the cecum, tubular adenomas, single rectal polyp tubular adenoma, nonbleeding internal hemorrhoids.  Appears to be a surgical clip at the appendiceal orifice.  Next colonoscopy in 5 years   COLONOSCOPY WITH PROPOFOL N/A 12/18/2022   Procedure: COLONOSCOPY WITH PROPOFOL;  Surgeon: Corbin Ade, MD;  [facility];  Service: Endoscopy;  Laterality: N/A;  8:45 am   ESOPHAGEAL MANOMETRY N/A 12/11/2021   Procedure: ESOPHAGEAL MANOMETRY (EM);  Surgeon: Napoleon Form, MD;  Location: [facility];  Service: Endoscopy;  Laterality: N/A;   ESOPHAGOGASTRODUODENOSCOPY  03/05/2012   erosive reflux esophagitis, bulbar erosions, gastric erosions/petichae, bx showed gastritis, no H.Pylori. Procedure: ESOPHAGOGASTRODUODENOSCOPY (EGD);  Surgeon: Corbin Ade, MD;  [facility];  Service: Endoscopy;  Laterality: N/A;  11:30   ESOPHAGOGASTRODUODENOSCOPY (EGD) WITH PROPOFOL N/A 12/14/2017   Dr. Jena Gauss: Erosive reflux esophagitis with questionable mild stricturing/non-obstruction at this level status post dilation.  multiple gastric erosions in the stomach biopsy showed reactive gastropathy but no H. pylori.   ESOPHAGOGASTRODUODENOSCOPY (EGD) WITH PROPOFOL N/A 10/13/2019   negative Barrett's, normal stomach   ESOPHAGOGASTRODUODENOSCOPY (EGD) WITH PROPOFOL N/A 05/20/2021   inlet patch with superimposed nodule s/p biopsy. Normal esophagus s/p dilation. Normal gastric mucosa. Normal duodenum. Reflux changes on path.   ESOPHAGOGASTRODUODENOSCOPY (EGD) WITH PROPOFOL N/A 08/04/2022   Procedure: ESOPHAGOGASTRODUODENOSCOPY (EGD) WITH PROPOFOL;  Surgeon: Lanelle Bal, DO;  [facility];  Service: Endoscopy;  Laterality: N/A;  1:15 pm   ESOPHAGOGASTRODUODENOSCOPY (EGD) WITH PROPOFOL N/A 12/18/2022   Procedure: ESOPHAGOGASTRODUODENOSCOPY (EGD) WITH PROPOFOL;  Surgeon: Corbin Ade, MD;  [facility];  Service: Endoscopy;  Laterality: N/A;   FOOT SURGERY Left    strethching of ligaments   HERNIA REPAIR     umbilical   KNEE SURGERY Right 2006   MALONEY DILATION N/A 12/14/2017   Procedure: Elease Hashimoto DILATION;  Surgeon: Corbin Ade, MD;  [facility];  Service: Endoscopy;  Laterality: N/A;   MALONEY DILATION  05/20/2021   Procedure: Elease Hashimoto DILATION;  Surgeon: Corbin Ade, MD;  [facility];  Service: Endoscopy;;   MALONEY DILATION N/A 12/18/2022   Procedure: Alvy Beal;  Surgeon: Corbin Ade, MD;  [facility];  Service: Endoscopy;  Laterality: N/A;   MULTIPLE EXTRACTIONS WITH ALVEOLOPLASTY  02/23/2012   Procedure: MULTIPLE EXTRACION WITH ALVEOLOPLASTY;  Surgeon: Georgia Lopes, DDS;  Location: [facility];  Service: Oral Surgery;  Laterality: Bilateral;  Removal of left mandibular torus, multiple extractions with alveoloplasty   POLYPECTOMY  12/14/2017   Procedure: POLYPECTOMY;  Surgeon: Corbin Ade, MD;  [facility];  Service: Endoscopy;;   POLYPECTOMY  12/18/2022   Procedure: POLYPECTOMY;  Surgeon: Corbin Ade, MD;  [facility];  Service: Endoscopy;;   reversal of colostomy  10/2011   per patient, did not have report   right knee surgery     sigmoid colectomy with end colostomy, Hartmann's pouch  08/2010   [facility]. complicated diverticulitis   UMBILICAL HERNIA REPAIR         Home Medications    Prior to Admission medications   Medication Sig Start Date End Date Taking? Authorizing Provider  albuterol (  VENTOLIN HFA) 108 (90 Base) MCG/ACT inhaler Inhale 2 puffs into the lungs every 6 (six) hours as needed for wheezing or shortness of breath. 02/23/23   Adron Bene, MD  Buprenorphine HCl-Naloxone HCl 8-2 MG FILM PLACE 1 STRIP UNDER THE TONGUE TWICE DAILY 02/23/23   Reymundo Poll, MD  diclofenac Sodium (VOLTAREN) 1 % GEL APPLY 2 GRAMS  TOPICALLY 4 TIMES DAILY Patient taking differently: Apply 2 g topically 4 (four) times daily as needed (shoulder pain.). 01/14/22   Rehman, Areeg N, DO  hydrochlorothiazide (HYDRODIURIL) 25 MG tablet Take 1 tablet (25 mg total) by mouth daily. 12/10/22   Adron Bene, MD  hydrocortisone (ANUSOL-HC) 2.5 % rectal cream Place 1 Application rectally 2 (two) times daily as needed for hemorrhoids. 05/28/22   Lanelle Bal, DO  meloxicam (MOBIC) 15 MG tablet  Take 1 tablet (15 mg total) by mouth daily. 01/05/23   Felecia Shelling, DPM  pantoprazole (PROTONIX) 40 MG tablet Take 1 tablet (40 mg total) by mouth 2 (two) times daily. 05/28/22 05/28/23  Lanelle Bal, DO  rosuvastatin (CRESTOR) 20 MG tablet Take 1 tablet (20 mg total) by mouth daily. 04/28/23   Sharlene Dory, NP  spironolactone (ALDACTONE) 50 MG tablet Take 1 tablet (50 mg total) by mouth daily. 05/26/22 05/26/23  Adron Bene, MD    Family History Family History  Problem Relation Age of Onset   Stomach cancer Father    Lung cancer Father    Throat cancer Father    Cancer Father        lung and throat cancer   Hypertension Father    Liver disease Cousin        cirrhosis, etoh   Throat cancer Paternal Uncle    Lung cancer Paternal Uncle    Colon cancer Cousin 22   Heart disease Mother    Hypertension Mother    Colon cancer Maternal Uncle    Throat cancer Paternal Uncle    Lung cancer Paternal Uncle    Throat cancer Paternal Uncle    Lung cancer Paternal Uncle     Social History Social History   Tobacco Use   Smoking status: Every Day    Current packs/day: 1.00    Average packs/day: 1 pack/day for 37.0 years (37.0 ttl pk-yrs)    Types: Cigarettes    Passive exposure: Current   Smokeless tobacco: Former   Tobacco comments:    Trying to quit, has nicotine patches at home which have helped him quit before  Vaping Use   Vaping status: Never Used  Substance Use Topics   Alcohol use: Not Currently   Drug use: Yes    Types: Marijuana    Comment: occasional     Allergies   Codeine, Latex, Morphine, Zestril [lisinopril], and Adhesive [tape]   Review of Systems Review of Systems Per HPI  Physical Exam Triage Vital Signs ED Triage Vitals  Encounter Vitals Group     BP 05/01/23 1046 130/89     Systolic BP Percentile --      Diastolic BP Percentile --      Pulse Rate 05/01/23 1046 91     Resp 05/01/23 1046 18     Temp 05/01/23 1046 97.6 F (36.4 C)      Temp Source 05/01/23 1046 Oral     SpO2 05/01/23 1046 92 %     Weight --      Height --      Head Circumference --  Peak Flow --      Pain Score 05/01/23 1047 10     Pain Loc --      Pain Education --      Exclude from Growth Chart --    No data found.  Updated Vital Signs BP 130/89 (BP Location: Right Arm)   Pulse 91   Temp 97.6 F (36.4 C) (Oral)   Resp 18   SpO2 92%   Visual Acuity Right Eye Distance:   Left Eye Distance:   Bilateral Distance:    Right Eye Near:   Left Eye Near:    Bilateral Near:     Physical Exam Vitals and nursing note reviewed.  Constitutional:      Appearance: Normal appearance.  HENT:     Head: Atraumatic.  Eyes:     Extraocular Movements: Extraocular movements intact.     Conjunctiva/sclera: Conjunctivae normal.     Pupils: Pupils are equal, round, and reactive to light.  Cardiovascular:     Rate and Rhythm: Normal rate and regular rhythm.  Pulmonary:     Effort: Pulmonary effort is normal.     Breath sounds: Normal breath sounds.  Musculoskeletal:        General: Swelling, tenderness and signs of injury present. No deformity. Normal range of motion.     Cervical back: Normal range of motion and neck supple.     Comments: Range of motion intact diffusely, normal gait, negative straight leg raise bilateral lower extremities, no midline spinal tenderness to palpation diffusely.  Bilateral lumbar lateral musculature tender to palpation.  Areas of hematomas to bilateral lower legs tender to palpation  Skin:    General: Skin is warm.     Comments: Superficial abrasions to the lower legs bilaterally and areas of impact.  Some hematomas to the lower legs bilaterally at sites of impact.  Bleeding well-controlled  Neurological:     General: No focal deficit present.     Mental Status: He is oriented to person, place, and time.     Cranial Nerves: No cranial nerve deficit.     Motor: No weakness.     Gait: Gait normal.     Comments: All 4  extremities neurovascularly intact  Psychiatric:        Mood and Affect: Mood normal.        Thought Content: Thought content normal.        Judgment: Judgment normal.      UC Treatments / Results  Labs (all labs ordered are listed, but only abnormal results are displayed) Labs Reviewed - No data to display  EKG   Radiology No results found.  Procedures Procedures (including critical care time)  Medications Ordered in UC Medications  ketorolac (TORADOL) 30 MG/ML injection 30 mg (30 mg Intramuscular Given 05/01/23 1114)    Initial Impression / Assessment and Plan / UC Course  I have reviewed the triage vital signs and the nursing notes.  Pertinent labs & imaging results that were available during my care of the patient were reviewed by me and considered in my medical decision making (see chart for details).     Wound care provided for superficial abrasions to lower legs, discussed RICE protocol, over-the-counter pain relievers as needed.  IM Toradol given for pain and inflammation as he is unable to take anti-inflammatories orally due to GI issues.  He states he is up-to-date on his tetanus shot within the last 5 years and does not wish to renew this today.  Discussed good home wound care, strict return precautions.  No red flag findings today on exam or history.  Final Clinical Impressions(s) / UC Diagnoses   Final diagnoses:  Abrasion of lower extremity, unspecified laterality, initial encounter  Bilateral leg pain  Lumbar pain  Fall, initial encounter     Discharge Instructions      Keep the skin wound clean with gentle soap and water, apply Neosporin and dressings to the area to keep them from getting infected.  Ice, elevation to the swollen painful areas to the legs from the fall.  We have given you a shot of anti-inflammatory pain medication today, you may also take Tylenol as needed for pain.  Follow-up for any worsening symptoms.    ED Prescriptions    None    PDMP not reviewed this encounter.   Particia Nearing, [place] 05/01/23 1154

## 2023-05-14 ENCOUNTER — Encounter: Payer: Self-pay | Admitting: Nurse Practitioner

## 2023-05-14 ENCOUNTER — Ambulatory Visit (INDEPENDENT_AMBULATORY_CARE_PROVIDER_SITE_OTHER): Payer: Commercial Managed Care - HMO | Admitting: Nurse Practitioner

## 2023-05-14 VITALS — BP 120/70 | HR 77 | Ht 67.0 in | Wt 208.2 lb

## 2023-05-14 DIAGNOSIS — E669 Obesity, unspecified: Secondary | ICD-10-CM | POA: Diagnosis not present

## 2023-05-14 DIAGNOSIS — G4719 Other hypersomnia: Secondary | ICD-10-CM | POA: Diagnosis not present

## 2023-05-14 DIAGNOSIS — R0683 Snoring: Secondary | ICD-10-CM

## 2023-05-14 DIAGNOSIS — E66811 Obesity, class 1: Secondary | ICD-10-CM

## 2023-05-14 DIAGNOSIS — G4733 Obstructive sleep apnea (adult) (pediatric): Secondary | ICD-10-CM

## 2023-05-14 NOTE — Progress Notes (Signed)
@Patient  ID: [name], male    DOB: Dec 03, 1965, 57 y.o.   MRN: [id number]  Chief Complaint  Patient presents with   Consult    Pt is here for Sleep Consult. Snore, Sleepless Nights, Fatigue. EPW- 11    Referring provider: Miguel Aschoff, MD  HPI: 57 year old male, active smoker referred for sleep consult. Past medical history significant for HTN, angina, COPD, lung nodules, GERD, depression, HLD, ED, gout, opiod use disorder, ETOH abuse.   TEST/EVENTS:   05/14/2023: Today - sleep consult Patient presents today for sleep consult, referred by Dr. Mayford Knife. He has had trouble with his sleep for many years. He wakes up frequently throughout the night. Can't seem to stay asleep. He has tried melatonin but it hasn't done much for him. He feels tired all the time. He snores loudly. Has woken himself up before. Sometimes he sleeps sitting up. He has gotten drowsy when he's driving longer distances but he pulls over and rests. Never fallen asleep while driving. No morning headaches, sleep parasomnias/paralysis.  He goes to bed between 9 to 10 pm. Falls asleep within 15 to 30 min. Wakes 5+ times a night. Usually wakes up around 530-6 am. No current sleep aids. He is trying to get disability. Weight is stable. He has had a previous sleep study years ago in [place] but doesn't remember the exact results. Never started CPAP. No O2 use.  He has a history of HTN, controlled on antihypertensives. He has had a stroke in the past. No DM. He has been diagnosed with COPD but not on maintenance inhaler. Rarely uses his albuterol. No recent exacerbations or hospitalizations.  He is an active smoker - 1.5 ppd currently. 37 pack year history. He quit drinking in 2014. He smokes marijuana. Drinks 2 cups of coffee in the morning. Lives by himself. Not currently working.   Epworth 11   Allergies  Allergen Reactions   Codeine Itching and Nausea And Vomiting   Latex Other (See Comments)    Turns skin  red.    Morphine Hives   Zestril [Lisinopril]     Caused Kidney failure   Adhesive [Tape] Rash    Immunization History  Administered Date(s) Administered   Influenza, Seasonal, Injecte, Preservative Fre 06/26/2011   Influenza,inj,Quad PF,6+ Mos 05/23/2020, 07/29/2021   Moderna Sars-Covid-2 Vaccination 12/22/2019, 01/18/2020   Pneumococcal Polysaccharide-23 09/03/2011   Tdap 05/07/1997, 11/07/2004, 05/27/2005, 09/13/2020    Past Medical History:  Diagnosis Date   Acute pancreatitis    Anxiety    Arthritis    Colonic adenoma    COPD (chronic obstructive pulmonary disease) (HCC)    Depression    Diverticulitis of colon with perforation 08/2010   GERD (gastroesophageal reflux disease)    History of hiatal hernia    Hypertension    Polysubstance abuse (HCC)    Renal insufficiency    Sleep apnea    Intolerant of CPAP   Stroke (HCC)    TIA (transient ischemic attack)     Tobacco History: Social History   Tobacco Use  Smoking Status Every Day   Current packs/day: 1.00   Average packs/day: 1 pack/day for 37.0 years (37.0 ttl pk-yrs)   Types: Cigarettes   Passive exposure: Current  Smokeless Tobacco Former  Tobacco Comments   Trying to quit, has nicotine patches at home which have helped him quit before   Ready to quit: Not Answered Counseling given: Not Answered Tobacco comments: Trying to quit, has nicotine patches at  home which have helped him quit before   Outpatient Medications Prior to Visit  Medication Sig Dispense Refill   albuterol (VENTOLIN HFA) 108 (90 Base) MCG/ACT inhaler Inhale 2 puffs into the lungs every 6 (six) hours as needed for wheezing or shortness of breath. 18 g 2   Buprenorphine HCl-Naloxone HCl 8-2 MG FILM PLACE 1 STRIP UNDER THE TONGUE TWICE DAILY 60 each 2   diclofenac Sodium (VOLTAREN) 1 % GEL APPLY 2 GRAMS  TOPICALLY 4 TIMES DAILY (Patient taking differently: Apply 2 g topically 4 (four) times daily as needed (shoulder pain.).) 200 g 0    hydrochlorothiazide (HYDRODIURIL) 25 MG tablet Take 1 tablet (25 mg total) by mouth daily. 90 tablet 3   hydrocortisone (ANUSOL-HC) 2.5 % rectal cream Place 1 Application rectally 2 (two) times daily as needed for hemorrhoids. 28 g 2   pantoprazole (PROTONIX) 40 MG tablet Take 1 tablet (40 mg total) by mouth 2 (two) times daily. 180 tablet 3   spironolactone (ALDACTONE) 50 MG tablet Take 1 tablet (50 mg total) by mouth daily. 30 tablet 11   meloxicam (MOBIC) 15 MG tablet Take 1 tablet (15 mg total) by mouth daily. 60 tablet 1   rosuvastatin (CRESTOR) 20 MG tablet Take 1 tablet (20 mg total) by mouth daily. 90 tablet 1   Facility-Administered Medications Prior to Visit  Medication Dose Route Frequency Provider Last Rate Last Admin   triamcinolone acetonide (KENALOG-40) injection 20 mg  20 mg Other Once Felecia Shelling, DPM         Review of Systems:   Constitutional: No weight loss or gain, night sweats, fevers, chills, or lassitude. +daytime fatigue  HEENT: No headaches, difficulty swallowing, tooth/dental problems, or sore throat. No sneezing, itching, ear ache, nasal congestion, or post nasal drip CV:  No chest pain, orthopnea, PND, swelling in lower extremities, anasarca, dizziness, palpitations, syncope Resp: +snoring, shortness of breath with exertion. No excess mucus or change in color of mucus. No productive or non-productive. No hemoptysis. No wheezing.  No chest wall deformity GI:  +heartburn, indigestion. No abdominal pain, nausea, vomiting, diarrhea, change in bowel habits, loss of appetite, bloody stools.  GU: No dysuria, change in color of urine, urgency or frequency.  Skin: No rash, lesions, ulcerations MSK:  No joint pain or swelling.   Neuro: No dizziness or lightheadedness.  Psych: No depression. +stable anxiety. Mood stable. +sleep disturbance    Physical Exam:  BP 120/70 (BP Location: Right Arm, Cuff Size: Large)   Pulse 77   Ht 5\' 7"  (1.702 m)   Wt 208 lb 3.2 oz  (94.4 kg)   SpO2 95%   BMI 32.61 kg/m   GEN: Pleasant, interactive; obese; in no acute distress. HEENT:  Normocephalic and atraumatic. PERRLA. Sclera white. Nasal turbinates pink, moist and patent bilaterally. No rhinorrhea present. Oropharynx pink and moist, without exudate or edema. No lesions, ulcerations, or postnasal drip. Mallampati II/III NECK:  Supple w/ fair ROM. No JVD present. Normal carotid impulses w/o bruits. Thyroid symmetrical with no goiter or nodules palpated. No lymphadenopathy.   CV: RRR, no m/r/g, no peripheral edema. Pulses intact, +2 bilaterally. No cyanosis, pallor or clubbing. PULMONARY:  Unlabored, regular breathing. Clear bilaterally A&P w/o wheezes/rales/rhonchi. No accessory muscle use.  GI: BS present and normoactive. Soft, non-tender to palpation. No organomegaly or masses detected.  MSK: No erythema, warmth or tenderness. Cap refil <2 sec all extrem. No deformities or joint swelling noted.  Neuro: A/Ox3. No focal deficits noted.  Skin: Warm, no lesions or rashe Psych: Normal affect and behavior. Judgement and thought content appropriate.     Lab Results:  CBC    Component Value Date/Time   WBC 7.7 11/28/2022 0947   RBC 5.38 11/28/2022 0947   HGB 16.6 11/28/2022 0947   HGB 16.1 04/09/2021 1605   HCT 49.1 11/28/2022 0947   HCT 47.2 04/09/2021 1605   PLT 215 11/28/2022 0947   PLT 252 04/09/2021 1605   MCV 91.3 11/28/2022 0947   MCV 88 04/09/2021 1605   MCH 30.9 11/28/2022 0947   MCHC 33.8 11/28/2022 0947   RDW 12.1 11/28/2022 0947   RDW 12.6 04/09/2021 1605   LYMPHSABS 1,825 11/28/2022 0947   MONOABS 0.5 06/26/2021 1726   EOSABS 46 11/28/2022 0947   BASOSABS 31 11/28/2022 0947    BMET    Component Value Date/Time   NA 140 04/24/2023 0830   K 4.2 04/24/2023 0830   CL 99 04/24/2023 0830   CO2 26 04/24/2023 0830   GLUCOSE 96 04/24/2023 0830   GLUCOSE 104 (H) 11/28/2022 0947   BUN 19 04/24/2023 0830   CREATININE 1.34 (H) 04/24/2023 0830    CREATININE 1.04 11/28/2022 0947   CALCIUM 9.2 04/24/2023 0830   GFRNONAA >60 07/29/2022 1436   GFRAA 89 05/23/2020 1456    BNP No results found for: "BNP"   Imaging:  No results found.  Administration History     None           No data to display          No results found for: "NITRICOXIDE"      Assessment & Plan:   Excessive daytime sleepiness He has snoring, excessive daytime sleepiness, restless sleep. BMI 32. History of HTN, stroke. Epworth 11. Given this,  I am concerned he could have sleep disordered breathing with obstructive sleep apnea. He will need sleep study for further evaluation.    - discussed how weight can impact sleep and risk for sleep disordered breathing - discussed options to assist with weight loss: combination of diet modification, cardiovascular and strength training exercises   - had an extensive discussion regarding the adverse health consequences related to untreated sleep disordered breathing - specifically discussed the risks for hypertension, coronary artery disease, cardiac dysrhythmias, cerebrovascular disease, and diabetes - lifestyle modification discussed   - discussed how sleep disruption can increase risk of accidents, particularly when driving - safe driving practices were discussed  Patient Instructions  Given your symptoms, I am concerned that you may have sleep disordered breathing with sleep apnea. You will need a sleep study for further evaluation. Someone will contact you to schedule this.   We discussed how untreated sleep apnea puts an individual at risk for cardiac arrhthymias, pulm HTN, DM, stroke and increases their risk for daytime accidents. We also briefly reviewed treatment options including weight loss, side sleeping position, oral appliance, CPAP therapy or referral to ENT for possible surgical options  Use caution when driving and pull over if you become sleepy.  Follow up in 6-8 weeks with Florentina Addison  Melaya Hoselton,NP to go over sleep study results, or sooner, if needed     Loud snoring See above  Obesity (BMI 30.0-34.9) BMI 32. Healthy weight loss encouraged   I spent 35 minutes of dedicated to the care of this patient on the date of this encounter to include pre-visit review of records, face-to-face time with the patient discussing conditions above, post visit ordering of testing, clinical documentation with the  electronic health record, making appropriate referrals as documented, and communicating necessary findings to members of the patients care team.  Noemi Chapel, NP 05/15/2023  Pt aware and understands NP's role.

## 2023-05-14 NOTE — Patient Instructions (Signed)
Given your symptoms, I am concerned that you may have sleep disordered breathing with sleep apnea. You will need a sleep study for further evaluation. Someone will contact you to schedule this.   We discussed how untreated sleep apnea puts an individual at risk for cardiac arrhthymias, pulm HTN, DM, stroke and increases their risk for daytime accidents. We also briefly reviewed treatment options including weight loss, side sleeping position, oral appliance, CPAP therapy or referral to ENT for possible surgical options  Use caution when driving and pull over if you become sleepy.  Follow up in 6-8 weeks with Katie Cobb,NP to go over sleep study results, or sooner, if needed

## 2023-05-15 ENCOUNTER — Ambulatory Visit: Payer: Commercial Managed Care - HMO | Attending: Nurse Practitioner | Admitting: Nurse Practitioner

## 2023-05-15 ENCOUNTER — Encounter: Payer: Self-pay | Admitting: Nurse Practitioner

## 2023-05-15 VITALS — BP 158/106 | HR 71 | Ht 67.0 in | Wt 207.0 lb

## 2023-05-15 DIAGNOSIS — I25119 Atherosclerotic heart disease of native coronary artery with unspecified angina pectoris: Secondary | ICD-10-CM | POA: Diagnosis not present

## 2023-05-15 DIAGNOSIS — R918 Other nonspecific abnormal finding of lung field: Secondary | ICD-10-CM

## 2023-05-15 DIAGNOSIS — R079 Chest pain, unspecified: Secondary | ICD-10-CM

## 2023-05-15 DIAGNOSIS — R7989 Other specified abnormal findings of blood chemistry: Secondary | ICD-10-CM

## 2023-05-15 DIAGNOSIS — I6529 Occlusion and stenosis of unspecified carotid artery: Secondary | ICD-10-CM

## 2023-05-15 DIAGNOSIS — F1910 Other psychoactive substance abuse, uncomplicated: Secondary | ICD-10-CM

## 2023-05-15 DIAGNOSIS — I10 Essential (primary) hypertension: Secondary | ICD-10-CM

## 2023-05-15 DIAGNOSIS — E669 Obesity, unspecified: Secondary | ICD-10-CM | POA: Insufficient documentation

## 2023-05-15 DIAGNOSIS — I700 Atherosclerosis of aorta: Secondary | ICD-10-CM | POA: Diagnosis not present

## 2023-05-15 DIAGNOSIS — Z8673 Personal history of transient ischemic attack (TIA), and cerebral infarction without residual deficits: Secondary | ICD-10-CM

## 2023-05-15 DIAGNOSIS — E782 Mixed hyperlipidemia: Secondary | ICD-10-CM

## 2023-05-15 DIAGNOSIS — G4719 Other hypersomnia: Secondary | ICD-10-CM | POA: Insufficient documentation

## 2023-05-15 DIAGNOSIS — R0683 Snoring: Secondary | ICD-10-CM | POA: Insufficient documentation

## 2023-05-15 MED ORDER — ISOSORBIDE MONONITRATE ER 30 MG PO TB24
30.0000 mg | ORAL_TABLET | Freq: Every day | ORAL | 3 refills | Status: DC
Start: 2023-05-15 — End: 2023-10-14

## 2023-05-15 MED ORDER — ROSUVASTATIN CALCIUM 20 MG PO TABS: 10.0000 mg | ORAL_TABLET | Freq: Every day | ORAL | 1 refills | Status: DC

## 2023-05-15 MED ORDER — ASPIRIN 81 MG PO TBEC: 81.0000 mg | DELAYED_RELEASE_TABLET | Freq: Every day | ORAL | Status: DC

## 2023-05-15 NOTE — Assessment & Plan Note (Signed)
See above

## 2023-05-15 NOTE — Assessment & Plan Note (Signed)
He has snoring, excessive daytime sleepiness, restless sleep. BMI 32. History of HTN, stroke. Epworth 11. Given this,  I am concerned he could have sleep disordered breathing with obstructive sleep apnea. He will need sleep study for further evaluation.    - discussed how weight can impact sleep and risk for sleep disordered breathing - discussed options to assist with weight loss: combination of diet modification, cardiovascular and strength training exercises   - had an extensive discussion regarding the adverse health consequences related to untreated sleep disordered breathing - specifically discussed the risks for hypertension, coronary artery disease, cardiac dysrhythmias, cerebrovascular disease, and diabetes - lifestyle modification discussed   - discussed how sleep disruption can increase risk of accidents, particularly when driving - safe driving practices were discussed  Patient Instructions  Given your symptoms, I am concerned that you may have sleep disordered breathing with sleep apnea. You will need a sleep study for further evaluation. Someone will contact you to schedule this.   We discussed how untreated sleep apnea puts an individual at risk for cardiac arrhthymias, pulm HTN, DM, stroke and increases their risk for daytime accidents. We also briefly reviewed treatment options including weight loss, side sleeping position, oral appliance, CPAP therapy or referral to ENT for possible surgical options  Use caution when driving and pull over if you become sleepy.  Follow up in 6-8 weeks with Jason Destyn Schuyler,NP to go over sleep study results, or sooner, if needed

## 2023-05-15 NOTE — Assessment & Plan Note (Signed)
BMI 32. Healthy weight loss encouraged.

## 2023-05-15 NOTE — Patient Instructions (Addendum)
Medication Instructions:  Your physician has recommended you make the following change in your medication:  Stop taking Meloxicam  Reduce Crestor to 10 Mg daily  Start IMDUR 30 Mg daily  Start Asiprin 81 Mg (Over the Counter) Start Tylenol Extra strength 1,000 Mg Twice Daily as needed (Over the Counter)  Continue all other medications as prescribed   Labwork: Please have previous lab work done today or next week   Testing/Procedures: Your physician has requested that you have a carotid duplex. This test is an ultrasound of the carotid arteries in your neck. It looks at blood flow through these arteries that supply the brain with blood. Allow one hour for this exam. There are no restrictions or special instructions.   Follow-Up: Your physician recommends that you schedule a follow-up appointment in: 6-8 weeks with Philis Nettle   Any Other Special Instructions Will Be Listed Below (If Applicable).  If you need a refill on your cardiac medications before your next appointment, please call your pharmacy.

## 2023-05-15 NOTE — Progress Notes (Unsigned)
Cardiology Office Note:  .   Date:  05/15/2023 ID:  [name], DOB 28-Feb-1966, MRN [id number] PCP: Laretta Bolster, MD  [facility] Providers Cardiologist:  Nona Dell, MD {  History of Present Illness: .   [name] is a 57 y.o. 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   Last seen by Dr. Diona Browner on November 21, 2020. Was referred for evaluation of chest discomfort. Lexiscan Myoview was obtained and was normal.   ED visit on February 18, 2023 for intermitent epigastric chest discomfort at [facility]. Described more CP at rest, described as burning sensation at center of his chest. CTA of chest revealed no acute PE with evidence of new small solid nodules medially in left lower lobe, hepatic steatosis. CXR showed patchy opacities in LLL. UDS positive for Marijuana and benzos. Trops negative.   Saw patient for follow-up on March 27, 2023.  He noted CP started one month ago. Described it as "sharp/indigestion," right-sided, occurred more with exertion, lasted for a few minutes, and sat down and improved, noticed with working outside. Rates 7-8/10 when it occurs. Denied any shortness of breath, palpitations, syncope, presyncope, dizziness, orthopnea, PND, swelling or significant weight changes, acute bleeding, or claudication. Stated he is not on any oxycodone or cocaine. Echo benign. NST revealed mild decreased activity along inferior wall, findings consistent with small area of mild ischemia.  Study was determined to be low risk.  Today presents for follow-up.  Since he continues to note chest pain similar to last office visit about 2-3 times, not as often as he last experienced.  SBP averages 120s at home, says his  diastolic tends to remain elevated around 100s.  Does admit to some anxiety. Denies any shortness of breath, palpitations, syncope, presyncope, dizziness, orthopnea, PND, swelling or significant weight changes, acute bleeding, or claudication.  Does admit to some sore joints after being started on Crestor. Tells me he also has a history of carotid artery stenosis, says he believes he is due for a carotid doppler.  SH: Occasionally uses marijuana and smokes 1 PPD, trying to quit. Denies any alcohol use.   Studies Reviewed: Marland Kitchen    EKG: EKG Interpretation Date/Time:  Friday May 15 2023 09:33:28 EDT Ventricular Rate:  73 PR Interval:  186 QRS Duration:  90 QT Interval:  388 QTC Calculation: 427 R Axis:   33  Text Interpretation: Normal sinus rhythm Cannot rule out Anterior infarct , age undetermined When compared with ECG of 25-Mar-2022 13:04, No significant change was found Confirmed by Sharlene Dory [id number]) on 05/15/2023 10:04:47 AM   Lexiscan 04/2023:   Eugenie Birks stress without EKG changes   Myoview scan stress images show mild decreased activity in the inferior wall (mid/distal). In the recovery images there was improvement in this region consistent with a small area of mild ischemia. When compared to scan from 2019, subtle thinning present in previous study  but in both the  rest and the stress images.   Note, motion during study may interfere with accuracy.   LVEF calculated at 70% with normal wall motion   Overall low risk study  Echo 03/2023: 1. Left ventricular ejection fraction, by estimation, is 60 to 65%. The  left ventricle has normal function. The left ventricle  has no regional  wall motion abnormalities. There is mild left ventricular hypertrophy.  Left ventricular diastolic parameters  are consistent with Grade I diastolic dysfunction (impaired relaxation).   2. Right ventricular systolic function is normal. The right ventricular  size is normal. Tricuspid regurgitation  signal is inadequate for assessing  PA pressure.   3. The mitral valve is normal in structure. No evidence of mitral valve  regurgitation. No evidence of mitral stenosis.   4. The aortic valve is tricuspid. Aortic valve regurgitation is not  visualized. No aortic stenosis is present.   5. The inferior vena cava is normal in size with greater than 50%  respiratory variability, suggesting right atrial pressure of 3 mmHg.   Comparison(s): No significant change from prior study.  Lexiscan 11/2020:  No diagnostic ST segment changes to indicate ischemia. No significant myocardial perfusion defects on stress imaging to indicate scar or ischemia. There is diaphragmatic attenuation noted, mainly on the rest images. This is a low risk study. Nuclear stress EF: 63%.   Physical Exam:   VS:  BP (!) 142/101 (BP Location: Right Arm, Patient Position: Sitting, Cuff Size: Normal)   Pulse 71   Ht 5\' 7"  (1.702 m)   Wt 207 lb (93.9 kg)   SpO2 98%   BMI 32.42 kg/m    Wt Readings from Last 3 Encounters:  05/15/23 207 lb (93.9 kg)  05/14/23 208 lb 3.2 oz (94.4 kg)  03/27/23 208 lb 3.2 oz (94.4 kg)    Repeat BP: 142/101  GEN: Obese, 57 y.o. male in no acute distress NECK: No JVD; No carotid bruits CARDIAC: S1/S2, RRR, no murmurs, rubs, gallops RESPIRATORY:  Clear to auscultation without rales, wheezing or rhonchi  ABDOMEN: Soft, non-tender, non-distended EXTREMITIES:  No edema; No deformity   ASSESSMENT AND PLAN: .    Chest pain of uncertain etiology, coronary atherosclerosis and aortic atherosclerosis Etiology unclear, does have some mixed atypical and typical features. Improved and not as often since last office visit. Low risk NST in 2023, recent NST low risk.  Recent updated echo benign.  Patient tells me he has been on aspirin in the past, says he previously stopped this as it made him feel sick.  Denied any GI bleeding.  He says he is willing to retry this.  Will start aspirin 81 mg daily.   Discussed with him to notify our office if he notices any GI bleeding or any upset stomach.  He verbalized understanding.  Will reduce Crestor to 10 mg daily to see if this helps improve his sore joints. He will let us know if this does not improve his symptoms. Will initiate Imdur 30 mg daily. No other medication changes at this time. Heart healthy diet and regular cardiovascular exercise encouraged. ED precautions discussed. Also recommended to stop Meloxicam and start Tylenol PRN for pain.  2. HTN BP elevated today. SBP goal < 130. Continue hydrochlorothiazide and Aldactone. Will initiate Imdur 30 mg daily. Given BP log and salty six diet sheet. Discussed to monitor BP at home at least 2 hours after medications and sitting for 5-10 minutes.   3. Mixed HLD Previously started him on Crestor 20 mg daily to help reduce his ASCVD, Admits to sore joints. Will reduce Crestor to 10 mg daily.  Previously arranged LFT/FLP at previous office visit. He will let us know if this reduction in Crestor dosage does not help his symptoms.  Heart healthy diet and regular cardiovascular exercise encouraged.   4. Hx of  CVA, carotid artery stenosis Denies any symptoms. Starting Aspirin 81 mg daily and reducing Crestor to 10 mg daily as mentioned above. Continue to follow with PCP.  Heart healthy diet and regular cardiovascular exercise encouraged. Says he has hx of carotid artery stenosis, will arrange carotid dopplers as he says he is due for this to be done again.   5. Pulmonary nodules CT of chest at [facility] 02/2023 showed new small solid nodules medially in left lower lobe, likely to be inflammatory. Was recommended to repeat non-contrast chest CT at 3-6 months. Plan to arrange at next OV.   6. Polysubstance abuse, elevated serum creatinine Substance abuse cessation encouraged and discussed. He verbalized understanding. Last sCr improved to 1.34 with eGFR at 62, likely dehydrated. Avoid nephrotoxic agents. Encouraged him  to remain well hydrated. Continue to follow with PCP.  Dispo: Follow-up with me or APP in 6-8 weeks or sooner if anything changes.   Signed, Sharlene Dory, NP

## 2023-05-18 ENCOUNTER — Encounter: Payer: Self-pay | Admitting: Nurse Practitioner

## 2023-05-20 ENCOUNTER — Ambulatory Visit: Payer: Commercial Managed Care - HMO | Attending: Nurse Practitioner

## 2023-05-20 DIAGNOSIS — I6523 Occlusion and stenosis of bilateral carotid arteries: Secondary | ICD-10-CM | POA: Diagnosis not present

## 2023-05-20 DIAGNOSIS — I25119 Atherosclerotic heart disease of native coronary artery with unspecified angina pectoris: Secondary | ICD-10-CM | POA: Diagnosis not present

## 2023-06-01 ENCOUNTER — Other Ambulatory Visit: Payer: Self-pay

## 2023-06-01 DIAGNOSIS — F1110 Opioid abuse, uncomplicated: Secondary | ICD-10-CM

## 2023-06-01 MED ORDER — BUPRENORPHINE HCL-NALOXONE HCL 8-2 MG SL FILM: ORAL_FILM | SUBLINGUAL | 2 refills | Status: DC

## 2023-06-03 ENCOUNTER — Other Ambulatory Visit: Payer: Self-pay

## 2023-06-03 DIAGNOSIS — F1110 Opioid abuse, uncomplicated: Secondary | ICD-10-CM

## 2023-06-03 MED ORDER — BUPRENORPHINE HCL-NALOXONE HCL 8-2 MG SL FILM
ORAL_FILM | SUBLINGUAL | 2 refills | Status: DC
Start: 2023-06-03 — End: 2023-10-14

## 2023-06-09 ENCOUNTER — Ambulatory Visit (HOSPITAL_COMMUNITY)
Admission: RE | Admit: 2023-06-09 | Discharge: 2023-06-09 | Disposition: A | Discharge status: Home / Self Care | Payer: Managed Care, Other (non HMO) | Source: Ambulatory Visit | Attending: Internal Medicine | Admitting: Internal Medicine

## 2023-06-09 ENCOUNTER — Encounter: Payer: Self-pay | Admitting: Student

## 2023-06-09 ENCOUNTER — Ambulatory Visit (INDEPENDENT_AMBULATORY_CARE_PROVIDER_SITE_OTHER): Payer: Managed Care, Other (non HMO) | Admitting: Student

## 2023-06-09 ENCOUNTER — Ambulatory Visit: Payer: Managed Care, Other (non HMO)

## 2023-06-09 VITALS — BP 143/92 | HR 72 | Wt 212.7 lb

## 2023-06-09 DIAGNOSIS — F17210 Nicotine dependence, cigarettes, uncomplicated: Secondary | ICD-10-CM | POA: Diagnosis not present

## 2023-06-09 DIAGNOSIS — F1110 Opioid abuse, uncomplicated: Secondary | ICD-10-CM

## 2023-06-09 DIAGNOSIS — J449 Chronic obstructive pulmonary disease, unspecified: Secondary | ICD-10-CM

## 2023-06-09 DIAGNOSIS — M79642 Pain in left hand: Secondary | ICD-10-CM | POA: Insufficient documentation

## 2023-06-09 DIAGNOSIS — I10 Essential (primary) hypertension: Secondary | ICD-10-CM

## 2023-06-09 DIAGNOSIS — F1191 Opioid use, unspecified, in remission: Secondary | ICD-10-CM

## 2023-06-09 DIAGNOSIS — M79641 Pain in right hand: Secondary | ICD-10-CM | POA: Insufficient documentation

## 2023-06-09 DIAGNOSIS — F1111 Opioid abuse, in remission: Secondary | ICD-10-CM

## 2023-06-09 DIAGNOSIS — G4733 Obstructive sleep apnea (adult) (pediatric): Secondary | ICD-10-CM | POA: Diagnosis not present

## 2023-06-09 DIAGNOSIS — R0683 Snoring: Secondary | ICD-10-CM

## 2023-06-09 DIAGNOSIS — G4719 Other hypersomnia: Secondary | ICD-10-CM

## 2023-06-09 MED ORDER — SPIRONOLACTONE 50 MG PO TABS: 50.0000 mg | ORAL_TABLET | Freq: Every day | ORAL | 11 refills | Status: DC

## 2023-06-09 NOTE — Assessment & Plan Note (Signed)
He has a history of COPD, on albuterol inhaler once a day.  He is a current smoker, smokes about 1 pack of cigarettes a day, with a 30-year history.   -PFT's. -Consider transitioning him to tiotropium at the next office visit.

## 2023-06-09 NOTE — Assessment & Plan Note (Signed)
UDS 3 months ago positive for buprenorphine, alprazolam and THC.  Per patient, he gets the alprazolam from his aunt.  Upon further chart review, it appears he was previously prescribed alprazolam for anxiety but was discontinued in 2022.  Denies cravings or relapse.  -Tox screen - Continue buprenorphine-naloxone 8-2 mg

## 2023-06-09 NOTE — Patient Instructions (Signed)
Thank you, [name] for allowing [place] to provide your care today.   Today we discussed :  Bilateral hand pain: Most likely from osteoarthritis.  Will get x-ray of bilateral hand.  Will call you with the results of the test.  2.  History of COPD: We will need PFTs since you have a history of COPD.    3.  Urine tox screen since you are on Suboxone  4.  I have provided refills for your medicines    I have ordered the following labs for you:  Lab Orders         BMP8+Anion Gap         ToxAssure Select,+Antidepr,UR          Follow up: 3-4 months    Should you have any questions or concerns please call the internal medicine clinic at [phone].    Laretta Bolster, MD  [facility]

## 2023-06-09 NOTE — Assessment & Plan Note (Signed)
Pressure slightly elevated 143/92.  Denies any headaches, or chest pains.  -Continue spironolactone 50 mg daily and hydrochlorothiazide 25 mg daily  -BMP at next office visit.

## 2023-06-09 NOTE — Assessment & Plan Note (Addendum)
Reporting about 6 months to 1 year history of bilateral pain and swelling in his wrist, and all 5 fingers.  He occasionally has some numbness and tingling in all 5 fingers.  On exam today, Tinel and Phalen test negative.  I suspect patient's symptoms more concerning for osteoarthritis.  The fact that he has numbness and tingling also concerning for underlying nerve compression.  He will be referred for nerve compression studies.    -Nerve conduction studies. -X-ray of bilateral wrist.

## 2023-06-09 NOTE — Progress Notes (Addendum)
Subjective:  CC: Bilateral hand pain and swelling.  HPI:  [name] is a 57 y.o. male with a past medical history stated below and presents today for bilateral hand pain and swelling. Please see problem based assessment and plan for additional details.  Past Medical History:  Diagnosis Date   Acute pancreatitis    Anxiety    Arthritis    Colonic adenoma    COPD (chronic obstructive pulmonary disease) (HCC)    Depression    Diverticulitis of colon with perforation 08/2010   GERD (gastroesophageal reflux disease)    History of hiatal hernia    Hypertension    Polysubstance abuse (HCC)    Renal insufficiency    Sleep apnea    Intolerant of CPAP   Stroke (HCC)    TIA (transient ischemic attack)     Current Outpatient Medications on File Prior to Visit  Medication Sig Dispense Refill   acetaminophen (TYLENOL) 500 MG tablet Take 1,000 mg by mouth in the morning and at bedtime.     albuterol (VENTOLIN HFA) 108 (90 Base) MCG/ACT inhaler Inhale 2 puffs into the lungs every 6 (six) hours as needed for wheezing or shortness of breath. 18 g 2   ALPRAZolam (XANAX) 1 MG tablet Take 1 mg by mouth daily.     aspirin EC 81 MG tablet Take 1 tablet (81 mg total) by mouth daily. Swallow whole.     Buprenorphine HCl-Naloxone HCl 8-2 MG FILM PLACE 1 STRIP UNDER THE TONGUE TWICE DAILY 60 each 2   diclofenac Sodium (VOLTAREN) 1 % GEL APPLY 2 GRAMS  TOPICALLY 4 TIMES DAILY (Patient taking differently: Apply 2 g topically 4 (four) times daily as needed (shoulder pain.).) 200 g 0   hydrochlorothiazide (HYDRODIURIL) 25 MG tablet Take 1 tablet (25 mg total) by mouth daily. 90 tablet 3   hydrocortisone (ANUSOL-HC) 2.5 % rectal cream Place 1 Application rectally 2 (two) times daily as needed for hemorrhoids. 28 g 2   isosorbide mononitrate (IMDUR) 30 MG 24 hr tablet Take 1 tablet (30 mg total) by mouth daily. 90 tablet 3   pantoprazole (PROTONIX) 40 MG tablet Take 1 tablet (40 mg total) by  mouth 2 (two) times daily. 180 tablet 3   rosuvastatin (CRESTOR) 20 MG tablet Take 0.5 tablets (10 mg total) by mouth daily. 45 tablet 1   Current Facility-Administered Medications on File Prior to Visit  Medication Dose Route Frequency Provider Last Rate Last Admin   triamcinolone acetonide (KENALOG-40) injection 20 mg  20 mg Other Once Felecia Shelling, DPM        Family History  Problem Relation Age of Onset   Stomach cancer Father    Lung cancer Father    Throat cancer Father    Cancer Father        lung and throat cancer   Hypertension Father    Liver disease Cousin        cirrhosis, etoh   Throat cancer Paternal Uncle    Lung cancer Paternal Uncle    Colon cancer Cousin 52   Heart disease Mother    Hypertension Mother    Colon cancer Maternal Uncle    Throat cancer Paternal Uncle    Lung cancer Paternal Uncle    Throat cancer Paternal Uncle    Lung cancer Paternal Uncle     Social History   Socioeconomic History   Marital status: Single    Spouse name: Not on file   Number  of children: 0   Years of education: Not on file   Highest education level: Not on file  Occupational History   Not on file  Tobacco Use   Smoking status: Every Day    Current packs/day: 1.00    Average packs/day: 1 pack/day for 37.0 years (37.0 ttl pk-yrs)    Types: Cigarettes    Passive exposure: Current   Smokeless tobacco: Former   Tobacco comments:    Trying to quit, has nicotine patches at home which have helped him quit before  Vaping Use   Vaping status: Never Used  Substance and Sexual Activity   Alcohol use: Not Currently   Drug use: Yes    Types: Marijuana    Comment: occasional   Sexual activity: Not on file  Other Topics Concern   Not on file  Social History Narrative   Not on file   Social Determinants of Health   Financial Resource Strain: High Risk (11/24/2022)   Overall Financial Resource Strain (CARDIA)    Difficulty of Paying Living Expenses: Hard  Food  Insecurity: Food Insecurity Present (11/24/2022)   Hunger Vital Sign    Worried About Programme researcher, broadcasting/film/video in the Last Year: Often true    Ran Out of Food in the Last Year: Often true  Transportation Needs: Not on file  Physical Activity: Not on file  Stress: Not on file  Social Connections: Not on file  Intimate Partner Violence: Not on file    Review of Systems: ROS negative except for what is noted on the assessment and plan.  Objective:   Vitals:   06/09/23 0849 06/09/23 0942  BP: (!) 130/94 (!) 143/92  Pulse: 72 72  SpO2: 95%   Weight: 212 lb 11.2 oz (96.5 kg)     Physical Exam: Constitutional: Well appearing, not in acute distress. Cardiovascular: regular rate and rhythm, no m/r/g Pulmonary/Chest: normal work of breathing on room air, lungs clear to auscultation bilaterally Abdominal: soft, non-tender, non-distended MSK: Diffuse swelling of bilateral fingers. Mild pain with palpation of PIP and DIP joints.   No erthyema or bony chnages on the PIP and DIP joints. Tinel and  Phalen test negative.  Assessment & Plan:  Chronic obstructive pulmonary disease (HCC) He has a history of COPD, on albuterol inhaler once a day.  He is a current smoker, smokes about 1 pack of cigarettes a day, with a 30-year history.   -PFT's. -Consider transitioning him to tiotropium at the next office visit.  Opioid use disorder, mild, in sustained remission, on maintenance therapy, abuse (HCC) UDS 3 months ago positive for buprenorphine, alprazolam and THC.  Per patient, he gets the alprazolam from his aunt.  Upon further chart review, it appears he was previously prescribed alprazolam for anxiety but was discontinued in 2022.  Denies cravings or relapse.  -Tox screen - Continue buprenorphine-naloxone 8-2 mg   Bilateral hand pain Reporting about 6 months to 1 year history of bilateral pain in his wrist, with numbness and tingling, that radiates to all the 5 fingers.  On exam today, tapping on  the wrist he endorses pain and mild numbness in all 5 fingers. Potential differential diagnosis includes osteoarthritis vs. carpal tunnel syndrome.  Symptoms are less consistent with carpal tunnel syndrome, due to overall distribution of pain in both fingers.    -Nerve conduction studies. -X-ray of bilateral wrist.  HTN (hypertension) Pressure slightly elevated 143/92.  Denies any headaches, or chest pains.  -Continue spironolactone 50 mg daily and  hydrochlorothiazide 25 mg daily  -BMP at next office visit.   Patient seen with Dr. Belenda Cruise, MD [facility] Internal Medicine  PGY-1 Pager: [phone]  Date 06/09/2023  Time 2:42 PM

## 2023-06-10 LAB — BMP8+ANION GAP
Anion Gap: 15 mmol/L (ref 10.0–18.0)
BUN/Creatinine Ratio: 13 (ref 9–20)
BUN: 15 mg/dL (ref 6–24)
CO2: 26 mmol/L (ref 20–29)
Calcium: 9.2 mg/dL (ref 8.7–10.2)
Chloride: 100 mmol/L (ref 96–106)
Creatinine, Ser: 1.15 mg/dL (ref 0.76–1.27)
Glucose: 94 mg/dL (ref 70–99)
Potassium: 4.2 mmol/L (ref 3.5–5.2)
Sodium: 141 mmol/L (ref 134–144)
eGFR: 74 mL/min/{1.73_m2} (ref >59)

## 2023-06-12 LAB — TOXASSURE SELECT,+ANTIDEPR,UR

## 2023-06-12 NOTE — Progress Notes (Signed)
Internal Medicine Clinic Attending  I was physically present during the key portions of the resident provided service and participated in the medical decision making of patient's management care. I reviewed pertinent patient test results.  The assessment, diagnosis, and plan were formulated together and I agree with the documentation in the resident's note.  Narendra, Nischal, MD  

## 2023-06-15 DIAGNOSIS — G4733 Obstructive sleep apnea (adult) (pediatric): Secondary | ICD-10-CM | POA: Diagnosis not present

## 2023-06-18 DIAGNOSIS — H5213 Myopia, bilateral: Secondary | ICD-10-CM | POA: Diagnosis not present

## 2023-06-25 ENCOUNTER — Other Ambulatory Visit: Payer: Self-pay | Admitting: Internal Medicine

## 2023-06-25 DIAGNOSIS — K219 Gastro-esophageal reflux disease without esophagitis: Secondary | ICD-10-CM

## 2023-07-02 ENCOUNTER — Other Ambulatory Visit: Payer: Self-pay | Admitting: Student

## 2023-07-03 ENCOUNTER — Encounter: Payer: Self-pay | Admitting: Nurse Practitioner

## 2023-07-03 ENCOUNTER — Telehealth: Payer: Self-pay | Admitting: Nurse Practitioner

## 2023-07-03 ENCOUNTER — Ambulatory Visit: Payer: Managed Care, Other (non HMO) | Attending: Nurse Practitioner | Admitting: Nurse Practitioner

## 2023-07-03 VITALS — BP 128/80 | HR 73 | Ht 67.0 in | Wt 209.4 lb

## 2023-07-03 DIAGNOSIS — I10 Essential (primary) hypertension: Secondary | ICD-10-CM

## 2023-07-03 DIAGNOSIS — F1910 Other psychoactive substance abuse, uncomplicated: Secondary | ICD-10-CM

## 2023-07-03 DIAGNOSIS — E782 Mixed hyperlipidemia: Secondary | ICD-10-CM

## 2023-07-03 DIAGNOSIS — I2510 Atherosclerotic heart disease of native coronary artery without angina pectoris: Secondary | ICD-10-CM

## 2023-07-03 DIAGNOSIS — Z8673 Personal history of transient ischemic attack (TIA), and cerebral infarction without residual deficits: Secondary | ICD-10-CM

## 2023-07-03 DIAGNOSIS — I6529 Occlusion and stenosis of unspecified carotid artery: Secondary | ICD-10-CM

## 2023-07-03 DIAGNOSIS — I700 Atherosclerosis of aorta: Secondary | ICD-10-CM | POA: Diagnosis not present

## 2023-07-03 DIAGNOSIS — E785 Hyperlipidemia, unspecified: Secondary | ICD-10-CM

## 2023-07-03 DIAGNOSIS — R918 Other nonspecific abnormal finding of lung field: Secondary | ICD-10-CM

## 2023-07-03 MED ORDER — EZETIMIBE 10 MG PO TABS
10.0000 mg | ORAL_TABLET | Freq: Every day | ORAL | 3 refills | Status: DC
Start: 2023-07-03 — End: 2023-10-14

## 2023-07-03 MED ORDER — SPIRONOLACTONE 50 MG PO TABS: 50.0000 mg | ORAL_TABLET | Freq: Every day | ORAL | Status: DC

## 2023-07-03 NOTE — Progress Notes (Unsigned)
Cardiology Office Note:  .   Date:  05/15/2023 ID:  [name], DOB 08/31/1966, MRN [id number] PCP: Laretta Bolster, MD  [facility] Providers Cardiologist:  Nona Dell, MD {  History of Present Illness: .   [name] is a 57 y.o. 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   Last seen by Dr. Diona Browner on November 21, 2020. Was referred for evaluation of chest discomfort. Lexiscan Myoview was obtained and was normal.   ED visit on February 18, 2023 for intermitent epigastric chest discomfort at [facility]. Described more CP at rest, described as burning sensation at center of his chest. CTA of chest revealed no acute PE with evidence of new small solid nodules medially in left lower lobe, hepatic steatosis. CXR showed patchy opacities in LLL. UDS positive for Marijuana and benzos. Trops negative.   Saw patient for follow-up on March 27, 2023.  He noted CP started one month ago. Described it as "sharp/indigestion," right-sided, occurred more with exertion, lasted for a few minutes, and sat down and improved, noticed with working outside. Rates 7-8/10 when it occurs. Denied any shortness of breath, palpitations, syncope, presyncope, dizziness, orthopnea, PND, swelling or significant weight changes, acute bleeding, or claudication. Stated he is not on any oxycodone or cocaine. Echo benign. NST revealed mild decreased activity along inferior wall, findings consistent with small area of mild ischemia.  Study was determined to be low risk.  Today presents for follow-up.  Since he continues to note chest pain similar to last office visit about 2-3 times, not as often as he last experienced.  SBP averages 120s at home, says his  diastolic tends to remain elevated around 100s.  Does admit to some anxiety. Denies any shortness of breath, palpitations, syncope, presyncope, dizziness, orthopnea, PND, swelling or significant weight changes, acute bleeding, or claudication.  Does admit to some sore joints after being started on Crestor. Tells me he also has a history of carotid artery stenosis, says he believes he is due for a carotid doppler.  SH: Occasionally uses marijuana and smokes 1 PPD, trying to quit. Denies any alcohol use.   Studies Reviewed: Marland Kitchen    EKG:     Lexiscan 04/2023:   Lexiscan stress without EKG changes   Myoview scan stress images show mild decreased activity in the inferior wall (mid/distal). In the recovery images there was improvement in this region consistent with a small area of mild ischemia. When compared to scan from 2019, subtle thinning present in previous study  but in both the  rest and the stress images.   Note, motion during study may interfere with accuracy.   LVEF calculated at 70% with normal wall motion   Overall low risk study  Echo 03/2023: 1. Left ventricular ejection fraction, by estimation, is 60 to 65%. The  left ventricle has normal function. The left ventricle has no regional  wall motion abnormalities. There is mild left ventricular hypertrophy.  Left ventricular diastolic parameters  are consistent with Grade I diastolic dysfunction (impaired relaxation).   2. Right ventricular systolic function is normal. The right ventricular  size is normal. Tricuspid regurgitation signal is inadequate for assessing  PA pressure.   3. The mitral valve is normal in structure. No evidence of  mitral valve  regurgitation. No evidence of mitral stenosis.   4. The aortic valve is tricuspid. Aortic valve regurgitation is not  visualized. No aortic stenosis is present.   5. The inferior vena cava is normal in size with greater than 50%  respiratory variability, suggesting right atrial pressure  of 3 mmHg.   Comparison(s): No significant change from prior study.  Lexiscan 11/2020:  No diagnostic ST segment changes to indicate ischemia. No significant myocardial perfusion defects on stress imaging to indicate scar or ischemia. There is diaphragmatic attenuation noted, mainly on the rest images. This is a low risk study. Nuclear stress EF: 63%.   Physical Exam:   VS:  There were no vitals taken for this visit.   Wt Readings from Last 3 Encounters:  06/09/23 212 lb 11.2 oz (96.5 kg)  05/15/23 207 lb (93.9 kg)  05/14/23 208 lb 3.2 oz (94.4 kg)    Repeat BP: 142/101  GEN: Obese, 57 y.o. male in no acute distress NECK: No JVD; No carotid bruits CARDIAC: S1/S2, RRR, no murmurs, rubs, gallops RESPIRATORY:  Clear to auscultation without rales, wheezing or rhonchi  ABDOMEN: Soft, non-tender, non-distended EXTREMITIES:  No edema; No deformity   ASSESSMENT AND PLAN: .    Chest pain of uncertain etiology, coronary atherosclerosis and aortic atherosclerosis Etiology unclear, does have some mixed atypical and typical features. Improved and not as often since last office visit. Low risk NST in 2023, recent NST low risk.  Recent updated echo benign.  Patient tells me he has been on aspirin in the past, says he previously stopped this as it made him feel sick.  Denied any GI bleeding.  He says he is willing to retry this.  Will start aspirin 81 mg daily.  Discussed with him to notify our office if he notices any GI bleeding or any upset stomach.  He verbalized understanding.  Will reduce Crestor to 10 mg daily to see if this helps improve his sore joints. He will let us know if this does not improve his symptoms. Will initiate Imdur 30 mg daily. No other medication changes at this time. Heart healthy diet and regular cardiovascular exercise encouraged. ED precautions discussed. Also recommended to stop Meloxicam and start Tylenol PRN for pain.  2. HTN BP elevated today. SBP goal < 130.  Continue hydrochlorothiazide and Aldactone. Will initiate Imdur 30 mg daily. Given BP log and salty six diet sheet. Discussed to monitor BP at home at least 2 hours after medications and sitting for 5-10 minutes.   3. Mixed HLD Previously started him on Crestor 20 mg daily to help reduce his ASCVD, Admits to sore joints. Will reduce Crestor to 10 mg daily.  Previously arranged LFT/FLP at previous office visit. He will let us know if this reduction in Crestor dosage does not help his symptoms.  Heart healthy diet and regular cardiovascular exercise encouraged.   4. Hx of CVA, carotid artery stenosis Denies any symptoms. Starting Aspirin 81 mg daily and reducing Crestor to 10 mg daily as mentioned above. Continue to follow with PCP.  Heart healthy diet and regular cardiovascular exercise encouraged. Says he has hx of carotid artery stenosis, will arrange carotid dopplers as he says he is due for this to be done again.   5. Pulmonary nodules CT of chest at [facility] 02/2023 showed new small solid nodules medially in left lower lobe, likely to be inflammatory. Was recommended to repeat non-contrast chest CT at 3-6 months. Plan to arrange at next  OV.   6. Polysubstance abuse, elevated serum creatinine Substance abuse cessation encouraged and discussed. He verbalized understanding. Last sCr improved to 1.34 with eGFR at 62, likely dehydrated. Avoid nephrotoxic agents. Encouraged him to remain well hydrated. Continue to follow with PCP.  Dispo: Follow-up with me or APP in 6-8 weeks or sooner if anything changes.   Signed, Sharlene Dory, NP

## 2023-07-03 NOTE — Telephone Encounter (Signed)
Checking percert on the following patient for testing scheduled at [facility].    CT CHEST w/o Contrast   07-18-2023

## 2023-07-03 NOTE — Patient Instructions (Addendum)
Medication Instructions:  Your physician has recommended you make the following change in your medication:   Start Zetia 10 mg Daily   *If you need a refill on your cardiac medications before your next appointment, please call your pharmacy*   Lab Work: Your physician recommends that you return for lab work in: 2 Months ( FASTING) at [facility]   If you have labs (blood work) drawn today and your tests are completely normal, you will receive your results only by: MyChart Message (if you have MyChart) OR A paper copy in the mail If you have any lab test that is abnormal or we need to change your treatment, we will call you to review the results.   Testing/Procedures: Non-Cardiac CT scanning, (CAT scanning), is a noninvasive, special x-ray that produces cross-sectional images of the body using x-rays and a computer. CT scans help physicians diagnose and treat medical conditions. For some CT exams, a contrast material is used to enhance visibility in the area of the body being studied. CT scans provide greater clarity and reveal more details than regular x-ray exams.    Follow-Up: At [facility], you and your health needs are our priority.  As part of our continuing mission to provide you with exceptional heart care, we have created designated Provider Care Teams.  These Care Teams include your primary Cardiologist (physician) and Advanced Practice Providers (APPs -  Physician Assistants and Nurse Practitioners) who all work together to provide you with the care you need, when you need it.  We recommend signing up for the patient portal called "MyChart".  Sign up information is provided on this After Visit Summary.  MyChart is used to connect with patients for Virtual Visits (Telemedicine).  Patients are able to view lab/test results, encounter notes, upcoming appointments, etc.  Non-urgent messages can be sent to your provider as well.   To learn more about what you can do with  MyChart, go to [email or web address].    Your next appointment:   6 month(s)  Provider:   You may see Nona Dell, MD or the following Advanced Practice Provider on your designated Care Team:   Sharlene Dory, NP    Other Instructions Thank you for choosing [facility]!

## 2023-07-13 ENCOUNTER — Encounter: Payer: Self-pay | Admitting: Nurse Practitioner

## 2023-07-13 ENCOUNTER — Ambulatory Visit (INDEPENDENT_AMBULATORY_CARE_PROVIDER_SITE_OTHER): Payer: Medicaid Other | Admitting: Nurse Practitioner

## 2023-07-13 VITALS — BP 110/80 | HR 86 | Temp 98.1°F | Ht 67.0 in | Wt 209.2 lb

## 2023-07-13 DIAGNOSIS — G4734 Idiopathic sleep related nonobstructive alveolar hypoventilation: Secondary | ICD-10-CM | POA: Insufficient documentation

## 2023-07-13 DIAGNOSIS — G4733 Obstructive sleep apnea (adult) (pediatric): Secondary | ICD-10-CM | POA: Diagnosis not present

## 2023-07-13 DIAGNOSIS — E66811 Obesity, class 1: Secondary | ICD-10-CM | POA: Diagnosis not present

## 2023-07-13 NOTE — Patient Instructions (Addendum)
Start CPAP 5-15 cmH2O, DreamWear full face mask, and heated humidity, every night, minimum of 4-6 hours a night.  Change equipment as directed. Wash your tubing with warm soap and water daily, hang to dry. Wash humidifier portion weekly. Use bottled, distilled water and change daily Be aware of reduced alertness and do not drive or operate heavy machinery if experiencing this or drowsiness.  Exercise encouraged, as tolerated. Healthy weight management discussed.  Avoid or decrease alcohol consumption and medications that make you more sleepy, if possible. Notify if persistent daytime sleepiness occurs even with consistent use of PAP therapy.   We discussed how untreated sleep apnea puts an individual at risk for cardiac arrhthymias, pulm HTN, DM, stroke and increases their risk for daytime accidents. We also briefly reviewed treatment options including weight loss, side sleeping position, oral appliance, CPAP therapy or referral to ENT for possible surgical options  Follow up in 10-12 weeks with Jason Frieda Arnall,NP to see how CPAP is going, or sooner, if needed

## 2023-07-13 NOTE — Progress Notes (Signed)
@Patient  ID: [name], male    DOB: [birth year], 57 y.o.   MRN: [id number]  Chief Complaint  Patient presents with   Follow-up    Breathing doing well.    Referring provider: Laretta Bolster, MD  HPI: 57 year old male, active smoker referred for sleep consult. Past medical history significant for HTN, angina, COPD, lung nodules, GERD, depression, HLD, ED, gout, opiod use disorder, ETOH abuse.   TEST/EVENTS:  06/09/2023 HST: AHI 26.2/h, SpO2 low 69%, average 87%  05/14/2023: OV with Jason Rials NP for sleep consult, referred by Dr. Mayford Oneal. He has had trouble with his sleep for many years. He wakes up frequently throughout the night. Can't seem to stay asleep. He has tried melatonin but it hasn't done much for him. He feels tired all the time. He snores loudly. Has woken himself up before. Sometimes he sleeps sitting up. He has gotten drowsy when he's driving longer distances but he pulls over and rests. Never fallen asleep while driving. No morning headaches, sleep parasomnias/paralysis.  He goes to bed between 9 to 10 pm. Falls asleep within 15 to 30 min. Wakes 5+ times a night. Usually wakes up around 530-6 am. No current sleep aids. He is trying to get disability. Weight is stable. He has had a previous sleep study years ago in [place] but doesn't remember the exact results. Never started CPAP. No O2 use.  He has a history of HTN, controlled on antihypertensives. He has had a stroke in the past. No DM. He has been diagnosed with COPD but not on maintenance inhaler. Rarely uses his albuterol. No recent exacerbations or hospitalizations.  He is an active smoker - 1.5 ppd currently. 37 pack year history. He quit drinking in 2014. He smokes marijuana. Drinks 2 cups of coffee in the morning. Lives by himself. Not currently working.  [place] 11  07/13/2023: Today - follow up Discussed the use of AI scribe software for clinical note transcription with the patient, who gave verbal consent to  proceed.  History of Present Illness   The patient, previously diagnosed with moderate sleep apnea, has been experiencing persistent sleep disturbances. Despite previous attempts at treatment with a CPAP machine years ago, the patient reported dissatisfaction due to the machine causing dry mouth and discomfort, leading to discontinuation of use. The patient's sleep quality remains poor, with frequent awakenings throughout the night, resulting in daytime fatigue . The patient has expressed understanding of the potential long-term health risks associated with untreated sleep apneas. The patient has a previous CPAP machine but has expressed interest in a newer model with a water chamber to alleviate the dry mouth experienced with the older machine. The patient has also shown willingness to explore different mask options to improve comfort during use. The patient has no other new or worsening symptoms related to their sleep apnea. Denies any drowsy driving, sleep parasomnias.       Allergies  Allergen Reactions   Aspirin Other (See Comments)    "Makes me feel bad."   Codeine Itching and Nausea And Vomiting   Latex Other (See Comments)    Turns skin red.    Morphine Hives   Zestril [Lisinopril]     Caused Kidney failure   Adhesive [Tape] Rash    Immunization History  Administered Date(s) Administered   Influenza, Seasonal, Injecte, Preservative Fre 06/26/2011   Influenza,inj,Quad PF,6+ Mos 05/23/2020, 07/29/2021   Moderna Sars-Covid-2 Vaccination 12/22/2019, 01/18/2020   Pneumococcal Polysaccharide-23 09/03/2011   Tdap 05/07/1997, 11/07/2004,  05/27/2005, 09/13/2020    Past Medical History:  Diagnosis Date   Acute pancreatitis    Anxiety    Arthritis    Colonic adenoma    COPD (chronic obstructive pulmonary disease) (HCC)    Depression    Diverticulitis of colon with perforation 08/2010   GERD (gastroesophageal reflux disease)    History of hiatal hernia    Hypertension     Polysubstance abuse (HCC)    Renal insufficiency    Sleep apnea    Intolerant of CPAP   Stroke (HCC)    TIA (transient ischemic attack)     Tobacco History: Social History   Tobacco Use  Smoking Status Every Day   Current packs/day: 1.00   Average packs/day: 1 pack/day for 37.0 years (37.0 ttl pk-yrs)   Types: Cigarettes   Passive exposure: Current  Smokeless Tobacco Former  Tobacco Comments   Trying to quit, has nicotine patches at home which have helped him quit before.  Smokes a little less than a pack a day.  Updated 07/13/2023.   Ready to quit: Not Answered Counseling given: Not Answered Tobacco comments: Trying to quit, has nicotine patches at home which have helped him quit before.  Smokes a little less than a pack a day.  Updated 07/13/2023.   Outpatient Medications Prior to Visit  Medication Sig Dispense Refill   acetaminophen (TYLENOL) 500 MG tablet Take 1,000 mg by mouth in the morning and at bedtime.     albuterol (VENTOLIN HFA) 108 (90 Base) MCG/ACT inhaler Inhale 2 puffs into the lungs every 6 (six) hours as needed for wheezing or shortness of breath. 18 g 2   ALPRAZolam (XANAX) 1 MG tablet Take 1 mg by mouth daily.     Buprenorphine HCl-Naloxone HCl 8-2 MG FILM PLACE 1 STRIP UNDER THE TONGUE TWICE DAILY 60 each 2   cycloSPORINE (RESTASIS) 0.05 % ophthalmic emulsion Place 1 drop into both eyes 2 (two) times daily.     diclofenac Sodium (VOLTAREN) 1 % GEL APPLY 2 GRAMS  TOPICALLY 4 TIMES DAILY (Patient taking differently: Apply 2 g topically 4 (four) times daily as needed (shoulder pain.).) 200 g 0   ezetimibe (ZETIA) 10 MG tablet Take 1 tablet (10 mg total) by mouth daily. 90 tablet 3   hydrochlorothiazide (HYDRODIURIL) 25 MG tablet Take 1 tablet (25 mg total) by mouth daily. 90 tablet 3   hydrocortisone (ANUSOL-HC) 2.5 % rectal cream Place 1 Application rectally 2 (two) times daily as needed for hemorrhoids. 28 g 2   isosorbide mononitrate (IMDUR) 30 MG 24 hr tablet  Take 1 tablet (30 mg total) by mouth daily. 90 tablet 3   pantoprazole (PROTONIX) 40 MG tablet TAKE 1 TABLET BY MOUTH TWICE A DAY 60 tablet 5   spironolactone (ALDACTONE) 50 MG tablet Take 1 tablet (50 mg total) by mouth daily.     Facility-Administered Medications Prior to Visit  Medication Dose Route Frequency Provider Last Rate Last Admin   triamcinolone acetonide (KENALOG-40) injection 20 mg  20 mg Other Once Felecia Shelling, DPM         Review of Systems:   Constitutional: No weight loss or gain, night sweats, fevers, chills, or lassitude. +daytime fatigue  HEENT: No headaches, difficulty swallowing, tooth/dental problems, or sore throat. No sneezing, itching, ear ache, nasal congestion, or post nasal drip CV:  No chest pain, orthopnea, PND, swelling in lower extremities, anasarca, dizziness, palpitations, syncope Resp: +snoring, shortness of breath with exertion. No excess mucus or  change in color of mucus. No productive or non-productive. No hemoptysis. No wheezing.  No chest wall deformity GI:  +heartburn, indigestion. No abdominal pain, nausea, vomiting, diarrhea, change in bowel habits, loss of appetite, bloody stools.  GU: No dysuria, change in color of urine, urgency or frequency.  Skin: No rash, lesions, ulcerations MSK:  No joint pain or swelling.   Neuro: No dizziness or lightheadedness.  Psych: No depression. +stable anxiety. Mood stable. +sleep disturbance    Physical Exam:  BP 110/80 (BP Location: Right Arm, Patient Position: Sitting, Cuff Size: Large)   Pulse 86   Temp 98.1 F (36.7 C) (Oral)   Ht 5\' 7"  (1.702 m)   Wt 209 lb 3.2 oz (94.9 kg)   SpO2 97%   BMI 32.77 kg/m   GEN: Pleasant, interactive; obese; in no acute distress. HEENT:  Normocephalic and atraumatic. PERRLA. Sclera white. Nasal turbinates pink, moist and patent bilaterally. No rhinorrhea present. Oropharynx pink and moist, without exudate or edema. No lesions, ulcerations, or postnasal drip.  Mallampati II/III NECK:  Supple w/ fair ROM. No JVD present. Normal carotid impulses w/o bruits. Thyroid symmetrical with no goiter or nodules palpated. No lymphadenopathy.   CV: RRR, no m/r/g, no peripheral edema. Pulses intact, +2 bilaterally. No cyanosis, pallor or clubbing. PULMONARY:  Unlabored, regular breathing. Clear bilaterally A&P w/o wheezes/rales/rhonchi. No accessory muscle use.  GI: BS present and normoactive. Soft, non-tender to palpation. No organomegaly or masses detected.  MSK: No erythema, warmth or tenderness. Cap refil <2 sec all extrem. No deformities or joint swelling noted.  Neuro: A/Ox3. No focal deficits noted.   Skin: Warm, no lesions or rashe Psych: Normal affect and behavior. Judgement and thought content appropriate.     Lab Results:  CBC    Component Value Date/Time   WBC 7.7 11/28/2022 0947   RBC 5.38 11/28/2022 0947   HGB 16.6 11/28/2022 0947   HGB 16.1 04/09/2021 1605   HCT 49.1 11/28/2022 0947   HCT 47.2 04/09/2021 1605   PLT 215 11/28/2022 0947   PLT 252 04/09/2021 1605   MCV 91.3 11/28/2022 0947   MCV 88 04/09/2021 1605   MCH 30.9 11/28/2022 0947   MCHC 33.8 11/28/2022 0947   RDW 12.1 11/28/2022 0947   RDW 12.6 04/09/2021 1605   LYMPHSABS 1,825 11/28/2022 0947   MONOABS 0.5 06/26/2021 1726   EOSABS 46 11/28/2022 0947   BASOSABS 31 11/28/2022 0947    BMET    Component Value Date/Time   NA 141 06/09/2023 0958   K 4.2 06/09/2023 0958   CL 100 06/09/2023 0958   CO2 26 06/09/2023 0958   GLUCOSE 94 06/09/2023 0958   GLUCOSE 104 (H) 11/28/2022 0947   BUN 15 06/09/2023 0958   CREATININE 1.15 06/09/2023 0958   CREATININE 1.04 11/28/2022 0947   CALCIUM 9.2 06/09/2023 0958   GFRNONAA >60 07/29/2022 1436   GFRAA 89 05/23/2020 1456    BNP No results found for: "BNP"   Imaging:  No results found.  Administration History     None           No data to display          No results found for:  "NITRICOXIDE"      Assessment & Plan:      Moderate Sleep Apnea Diagnosed with moderate sleep apnea with AHI 26/h, leading to frequent awakenings, daytime fatigue, and poor sleep quality. We discussed how untreated sleep apnea puts an individual at risk for cardiac arrhthymias,  pulm HTN, DM, stroke and increases their risk for daytime accidents. We also briefly reviewed treatment options including weight loss, side sleeping position, oral appliance, CPAP therapy or referral to ENT for possible surgical options. Shared decision to retry CPAP therapy. Discussed newer CPAP machines with water chambers and adjustable humidity settings. Will have him use a DreamWear fullface mouth to combat mouth breathing contributing to dryness. Explained the importance of equipment maintenance and component replacement. Patient declined the Inspire device due to preference for non-surgical interventions. - Initiate CPAP therapy with auto-regulating pressure setting 5-15 cmH2O - Recommend Dreamwear full face mask to address dry mouth - Educate on CPAP equipment maintenance: daily cleaning of mask and tubing with warm soapy water, daily replacement of water with bottled distilled water, and weekly cleaning of water chamber - Provide instructions on CPAP component replacement - Coordinate with medical supply company to provide CPAP machine through Medicaid - Schedule follow-up in 10-12 weeks to assess CPAP therapy efficacy - Advise to contact office if no response from medical supply company within 2-3 weeks or if issues with CPAP machine arise.  Obesity - Reviewed correlation between obesity and sleep apnea - Healthy weight loss encouraged   Nocturnal hypoxia - Significant oxygen desaturations during HST with moderate OSA - Recommend ONO on CPAP once therapy initiated. If hypoxia persists, will need formal titration study to evaluate for need for supplemental O2 with PAP therapy     Advised if symptoms do not  improve or worsen, to please contact office for sooner follow up or seek emergency care.   I spent 35 minutes of dedicated to the care of this patient on the date of this encounter to include pre-visit review of records, face-to-face time with the patient discussing conditions above, post visit ordering of testing, clinical documentation with the electronic health record, making appropriate referrals as documented, and communicating necessary findings to members of the patients care team.  Noemi Chapel, NP 07/13/2023  Pt aware and understands NP's role.

## 2023-07-18 ENCOUNTER — Ambulatory Visit (HOSPITAL_COMMUNITY): Payer: Medicaid Other

## 2023-07-20 ENCOUNTER — Telehealth: Payer: Self-pay | Admitting: Nurse Practitioner

## 2023-07-20 NOTE — Telephone Encounter (Signed)
Checking percert on the following patient for testing scheduled at [facility].   CT Chest Wo Contrast  07/21/2023  (RESCHEDULED)

## 2023-07-21 ENCOUNTER — Ambulatory Visit (HOSPITAL_COMMUNITY)
Admission: RE | Admit: 2023-07-21 | Discharge: 2023-07-21 | Disposition: A | Discharge status: Home / Self Care | Payer: Medicaid Other | Source: Ambulatory Visit | Attending: Nurse Practitioner | Admitting: Nurse Practitioner

## 2023-07-21 DIAGNOSIS — E785 Hyperlipidemia, unspecified: Secondary | ICD-10-CM | POA: Diagnosis not present

## 2023-07-21 DIAGNOSIS — R918 Other nonspecific abnormal finding of lung field: Secondary | ICD-10-CM | POA: Diagnosis present

## 2023-07-22 LAB — HEPATIC FUNCTION PANEL
ALT: 22 [IU]/L (ref 0–44)
AST: 23 [IU]/L (ref 0–40)
Albumin: 4.2 g/dL (ref 3.8–4.9)
Alkaline Phosphatase: 78 [IU]/L (ref 44–121)
Bilirubin Total: 0.4 mg/dL (ref 0.0–1.2)
Bilirubin, Direct: 0.13 mg/dL (ref 0.00–0.40)
Total Protein: 7.3 g/dL (ref 6.0–8.5)

## 2023-07-22 LAB — LIPID PANEL
Chol/HDL Ratio: 4.4 ratio (ref 0.0–5.0)
Cholesterol, Total: 175 mg/dL (ref 100–199)
HDL: 40 mg/dL (ref >39)
LDL Chol Calc (NIH): 113 mg/dL — ABNORMAL HIGH (ref 0–99)
Triglycerides: 121 mg/dL (ref 0–149)
VLDL Cholesterol Cal: 22 mg/dL (ref 5–40)

## 2023-07-25 ENCOUNTER — Ambulatory Visit (HOSPITAL_COMMUNITY): Payer: Medicaid Other

## 2023-08-04 DIAGNOSIS — G4733 Obstructive sleep apnea (adult) (pediatric): Secondary | ICD-10-CM | POA: Diagnosis not present

## 2023-08-09 DIAGNOSIS — G4733 Obstructive sleep apnea (adult) (pediatric): Secondary | ICD-10-CM | POA: Diagnosis not present

## 2023-08-10 NOTE — Addendum Note (Signed)
Addended by: Sharen Hones on: 08/10/2023 10:09 AM   Modules accepted: Orders

## 2023-08-12 ENCOUNTER — Telehealth: Payer: Self-pay | Admitting: *Deleted

## 2023-08-12 ENCOUNTER — Other Ambulatory Visit: Payer: Self-pay | Admitting: Gastroenterology

## 2023-08-12 DIAGNOSIS — K649 Unspecified hemorrhoids: Secondary | ICD-10-CM

## 2023-08-12 MED ORDER — HYDROCORTISONE (PERIANAL) 2.5 % EX CREA: 1.0000 | TOPICAL_CREAM | Freq: Two times a day (BID) | CUTANEOUS | 2 refills | Status: AC | PRN

## 2023-08-12 NOTE — Telephone Encounter (Signed)
Rx sent 

## 2023-08-12 NOTE — Telephone Encounter (Signed)
Pt called needs hemorrhoid cream sent to [facility] in [place]. Pt last OV 11/28/2022

## 2023-08-14 ENCOUNTER — Telehealth: Payer: Self-pay | Admitting: Student

## 2023-08-14 ENCOUNTER — Ambulatory Visit (INDEPENDENT_AMBULATORY_CARE_PROVIDER_SITE_OTHER): Payer: Medicaid Other | Admitting: Student

## 2023-08-14 ENCOUNTER — Encounter: Payer: Self-pay | Admitting: Student

## 2023-08-14 DIAGNOSIS — J019 Acute sinusitis, unspecified: Secondary | ICD-10-CM | POA: Diagnosis not present

## 2023-08-14 DIAGNOSIS — J449 Chronic obstructive pulmonary disease, unspecified: Secondary | ICD-10-CM | POA: Diagnosis not present

## 2023-08-14 MED ORDER — FLUTICASONE PROPIONATE 50 MCG/ACT NA SUSP: 2.0000 | Freq: Every day | NASAL | 0 refills | Status: DC

## 2023-08-14 MED ORDER — ALBUTEROL SULFATE HFA 108 (90 BASE) MCG/ACT IN AERS: 2.0000 | INHALATION_SPRAY | Freq: Four times a day (QID) | RESPIRATORY_TRACT | 2 refills | Status: DC | PRN

## 2023-08-14 NOTE — Telephone Encounter (Signed)
Pt reporting he needs something for his sx, bad headache, runny nose, cough, and is throat is now a little sore from the coughing.    Pt states he has been taking Coricidin  Max Strength for the past 3 days and it does not seem to be working at all.   Please call the patient back.

## 2023-08-14 NOTE — Telephone Encounter (Signed)
Return pt's call - c/o running nose, NP cough, sore throat,h/a; he has taken Coricidin x 3 days but has not helped. Denied fever. Has not taken a covid test. Telehealth appt schedule for today @ 1530 PM with Dr Daiva Eves.

## 2023-08-14 NOTE — Assessment & Plan Note (Signed)
Patient requested telehealth appointment for a 3 day course of "swimmy-head", rhinorrhea with clear mucus, sinus pressure and pain that has not improved despite taking Coridicidin HPB. He is a current smokes and is suspected tohave a COPD diagnosis. Though he has been without his albuterol recue inhaler, he denies dyspnea, cough, or sputum production. No chest pain, fevers, myalgias.  On the phone, patient sound congested, can speak in full sentences, and responds to questions appropriately.  Likely a viral URI/rhino sinusitis. Unlikely he is currently in a COPD exacerbation state. Discussed treatment with nasal irrigation followed by Bryna Colander will be sent to his preferred pharmacy, and continuation of Coricidin therapy for the next week.  I will also send a refill for his albuterol inhaler. Discussed if there is worsening symptoms, changes in the volume and quality of sputum, he will likely need antibiotics. He will likely also require evaluation for potential COPD exacerbation at that time. Patient expressed understanding.

## 2023-08-14 NOTE — Progress Notes (Signed)
  [facility] Internal Medicine Residency Telephone Encounter Continuity Care Appointment  HPI:  This telephone encounter was created for [name] on 08/14/2023 for the following purpose/cc nasal congestion and headaches.   Past Medical History:  Past Medical History:  Diagnosis Date   Acute pancreatitis    Anxiety    Arthritis    Colonic adenoma    COPD (chronic obstructive pulmonary disease) (HCC)    Depression    Diverticulitis of colon with perforation 08/2010   GERD (gastroesophageal reflux disease)    History of hiatal hernia    Hypertension    Polysubstance abuse (HCC)    Renal insufficiency    Sleep apnea    Intolerant of CPAP   Stroke (HCC)    TIA (transient ischemic attack)      ROS:     Assessment / Plan / Recommendations:  Acute rhinosinusitis Patient requested telehealth appointment for a 3 day course of "swimmy-head", rhinorrhea with clear mucus, sinus pressure and pain that has not improved despite taking Coridicidin HPB. He is a current smokes and is suspected tohave a COPD diagnosis. Though he has been without his albuterol recue inhaler, he denies dyspnea, cough, or sputum production. No chest pain, fevers, myalgias.  On the phone, patient sound congested, can speak in full sentences, and responds to questions appropriately.  Likely a viral URI/rhino sinusitis. Unlikely he is currently in a COPD exacerbation state. Discussed treatment with nasal irrigation followed by Bryna Colander will be sent to his preferred pharmacy, and continuation of Coricidin therapy for the next week.  I will also send a refill for his albuterol inhaler. Discussed if there is worsening symptoms, changes in the volume and quality of sputum, he will likely need antibiotics. He will likely also require evaluation for potential COPD exacerbation at that time. Patient expressed understanding.   As always, pt is advised that if symptoms worsen or new symptoms arise, they  should go to an urgent care facility or to to ER for further evaluation.   Consent and Medical Decision Making:  Patient discussed with Dr. Oswaldo Done This is a telephone encounter between [name] and [name] ,MD on 08/14/2023 for viral illness. The visit was conducted with the patient located at home and [name] ,MD at [facility]. The patient's identity was confirmed using their DOB and current address. The patient has consented to being evaluated through a telephone encounter and understands the associated risks (an examination cannot be done and the patient may need to come in for an appointment) / benefits (allows the patient to remain at home, decreasing exposure to coronavirus). I personally spent 20 minutes on medical discussion.

## 2023-08-17 NOTE — Progress Notes (Signed)
 Internal Medicine Clinic Attending  Case discussed with the resident physician at the time of the visit.  We reviewed the patient's history, exam, and pertinent patient test results.  I agree with the assessment, diagnosis, and plan of care documented in the resident's note.

## 2023-08-23 DIAGNOSIS — J029 Acute pharyngitis, unspecified: Secondary | ICD-10-CM | POA: Diagnosis not present

## 2023-08-23 DIAGNOSIS — H6693 Otitis media, unspecified, bilateral: Secondary | ICD-10-CM | POA: Diagnosis not present

## 2023-08-23 DIAGNOSIS — R5381 Other malaise: Secondary | ICD-10-CM | POA: Diagnosis not present

## 2023-09-03 DIAGNOSIS — G4733 Obstructive sleep apnea (adult) (pediatric): Secondary | ICD-10-CM | POA: Diagnosis not present

## 2023-09-04 ENCOUNTER — Other Ambulatory Visit: Payer: Self-pay | Admitting: Student

## 2023-09-04 DIAGNOSIS — J019 Acute sinusitis, unspecified: Secondary | ICD-10-CM

## 2023-09-09 ENCOUNTER — Other Ambulatory Visit: Payer: Self-pay | Admitting: Internal Medicine

## 2023-09-09 DIAGNOSIS — F1110 Opioid abuse, uncomplicated: Secondary | ICD-10-CM

## 2023-09-09 DIAGNOSIS — G4733 Obstructive sleep apnea (adult) (pediatric): Secondary | ICD-10-CM | POA: Diagnosis not present

## 2023-09-15 NOTE — Telephone Encounter (Signed)
 Too early. I verified with pt, he just picked up a new prescription about 2-3 days ago.

## 2023-10-01 ENCOUNTER — Other Ambulatory Visit: Payer: Self-pay | Admitting: Internal Medicine

## 2023-10-01 DIAGNOSIS — J019 Acute sinusitis, unspecified: Secondary | ICD-10-CM

## 2023-10-01 NOTE — Telephone Encounter (Signed)
Pharmacy requesting a 90 day supply  Medication sent to pharmacy.

## 2023-10-04 DIAGNOSIS — G4733 Obstructive sleep apnea (adult) (pediatric): Secondary | ICD-10-CM | POA: Diagnosis not present

## 2023-10-05 ENCOUNTER — Ambulatory Visit (INDEPENDENT_AMBULATORY_CARE_PROVIDER_SITE_OTHER): Payer: Medicaid Other | Admitting: Nurse Practitioner

## 2023-10-05 ENCOUNTER — Encounter: Payer: Self-pay | Admitting: Nurse Practitioner

## 2023-10-05 VITALS — BP 130/88 | HR 80 | Temp 97.6°F | Ht 67.0 in | Wt 216.6 lb

## 2023-10-05 DIAGNOSIS — G4734 Idiopathic sleep related nonobstructive alveolar hypoventilation: Secondary | ICD-10-CM

## 2023-10-05 DIAGNOSIS — G4733 Obstructive sleep apnea (adult) (pediatric): Secondary | ICD-10-CM | POA: Diagnosis not present

## 2023-10-05 NOTE — Patient Instructions (Signed)
Increase CPAP use to every night, minimum of 4-6 hours a night.  Change equipment as directed. Wash your tubing with warm soap and water daily, hang to dry. Wash humidifier portion weekly. Use bottled, distilled water and change daily Be aware of reduced alertness and do not drive or operate heavy machinery if experiencing this or drowsiness.  Exercise encouraged, as tolerated. Healthy weight management discussed.  Avoid or decrease alcohol consumption and medications that make you more sleepy, if possible. Notify if persistent daytime sleepiness occurs even with consistent use of PAP therapy.  We discussed how untreated sleep apnea puts an individual at risk for cardiac arrhthymias, pulm HTN, DM, stroke and increases their risk for daytime accidents. We also briefly reviewed treatment options including weight loss, side sleeping position, oral appliance, CPAP therapy or referral to ENT for possible surgical options  Adjust CPAP settings to 8-12 cmH2O  Look at adjusting your humidity settings  Mask fitting  Follow up in 6 weeks with Dr. Wynona Neat or Philis Nettle, or sooner, if needed

## 2023-10-05 NOTE — Progress Notes (Unsigned)
@Patient  ID: [name], male    DOB: Nov 15, 1965, 58 y.o.   MRN: [id number]  Chief Complaint  Patient presents with   Follow-up    Referring provider: Laretta Bolster, MD  HPI: 58 year old male, active smoker followed for sleep apnea. Past medical history significant for HTN, angina, COPD, lung nodules, GERD, depression, HLD, ED, gout, opiod use disorder, ETOH abuse.   TEST/EVENTS:  06/09/2023 HST: AHI 26.2/h, SpO2 low 69%, average 87%  05/14/2023: OV with Reade Trefz NP for sleep consult, referred by Dr. Mayford Knife. He has had trouble with his sleep for many years. He wakes up frequently throughout the night. Can't seem to stay asleep. He has tried melatonin but it hasn't done much for him. He feels tired all the time. He snores loudly. Has woken himself up before. Sometimes he sleeps sitting up. He has gotten drowsy when he's driving longer distances but he pulls over and rests. Never fallen asleep while driving. No morning headaches, sleep parasomnias/paralysis.  He goes to bed between 9 to 10 pm. Falls asleep within 15 to 30 min. Wakes 5+ times a night. Usually wakes up around 530-6 am. No current sleep aids. He is trying to get disability. Weight is stable. He has had a previous sleep study years ago in [place] but doesn't remember the exact results. Never started CPAP. No O2 use.  He has a history of HTN, controlled on antihypertensives. He has had a stroke in the past. No DM. He has been diagnosed with COPD but not on maintenance inhaler. Rarely uses his albuterol. No recent exacerbations or hospitalizations.  He is an active smoker - 1.5 ppd currently. 37 pack year history. He quit drinking in 2014. He smokes marijuana. Drinks 2 cups of coffee in the morning. Lives by himself. Not currently working.  [place] 11  07/13/2023: Ov with Mahlet Jergens NP. The patient, previously diagnosed with moderate sleep apnea, has been experiencing persistent sleep disturbances. Despite previous attempts at treatment  with a CPAP machine years ago, the patient reported dissatisfaction due to the machine causing dry mouth and discomfort, leading to discontinuation of use. The patient's sleep quality remains poor, with frequent awakenings throughout the night, resulting in daytime fatigue . The patient has expressed understanding of the potential long-term health risks associated with untreated sleep apneas. The patient has a previous CPAP machine but has expressed interest in a newer model with a water chamber to alleviate the dry mouth experienced with the older machine. The patient has also shown willingness to explore different mask options to improve comfort during use. The patient has no other new or worsening symptoms related to their sleep apnea. Denies any drowsy driving, sleep parasomnias.   10/05/2023: Today - follow up Patient presents today for follow up. He was started on CPAP after our last visit for moderate OSA. He's been having trouble getting used to his CPAP. Feels like sometimes it blows to hard. He also wakes up with a really dry mouth. Has not adjusted his humidity settings. He still feels tired during the day. He did feel better after using the CPAP for an entire night. He denies drowsy driving, sleep parasomnias/paralysis.   07/19/2023-08/17/2023: CPAP 5-15 cmH2O 4/30 days; >4 hr 7%, average use 4 hr 39 min Pressure 95th 12.9 Leaks 95th 24.6 AHI 4.6   Allergies  Allergen Reactions   Aspirin Other (See Comments)    "Makes me feel bad."   Codeine Itching and Nausea And Vomiting   Latex Other (See  Comments)    Turns skin red.    Morphine Hives   Zestril [Lisinopril]     Caused Kidney failure   Adhesive [Tape] Rash    Immunization History  Administered Date(s) Administered   Influenza, Seasonal, Injecte, Preservative Fre 06/26/2011   Influenza,inj,Quad PF,6+ Mos 05/23/2020, 07/29/2021   Moderna Sars-Covid-2 Vaccination 12/22/2019, 01/18/2020   Pneumococcal Polysaccharide-23  09/03/2011   Tdap 05/07/1997, 11/07/2004, 05/27/2005, 09/13/2020    Past Medical History:  Diagnosis Date   Acute pancreatitis    Anxiety    Arthritis    Colonic adenoma    COPD (chronic obstructive pulmonary disease) (HCC)    Depression    Diverticulitis of colon with perforation 08/2010   GERD (gastroesophageal reflux disease)    History of hiatal hernia    Hypertension    Polysubstance abuse (HCC)    Renal insufficiency    Sleep apnea    Intolerant of CPAP   Stroke (HCC)    TIA (transient ischemic attack)     Tobacco History: Social History   Tobacco Use  Smoking Status Every Day   Current packs/day: 1.00   Average packs/day: 1 pack/day for 37.0 years (37.0 ttl pk-yrs)   Types: Cigarettes   Passive exposure: Current  Smokeless Tobacco Former  Tobacco Comments   Trying to quit, has nicotine patches at home which have helped him quit before.  Smokes a little less than a pack a day.  Updated 07/13/2023.   Ready to quit: Yes Counseling given: Not Answered Tobacco comments: Trying to quit, has nicotine patches at home which have helped him quit before.  Smokes a little less than a pack a day.  Updated 07/13/2023.   Outpatient Medications Prior to Visit  Medication Sig Dispense Refill   acetaminophen (TYLENOL) 500 MG tablet Take 1,000 mg by mouth in the morning and at bedtime.     albuterol (VENTOLIN HFA) 108 (90 Base) MCG/ACT inhaler Inhale 2 puffs into the lungs every 6 (six) hours as needed for wheezing or shortness of breath. 18 g 2   ALPRAZolam (XANAX) 1 MG tablet Take 1 mg by mouth daily.     Buprenorphine HCl-Naloxone HCl 8-2 MG FILM PLACE 1 STRIP UNDER THE TONGUE TWICE DAILY 60 each 2   cycloSPORINE (RESTASIS) 0.05 % ophthalmic emulsion Place 1 drop into both eyes 2 (two) times daily.     diclofenac Sodium (VOLTAREN) 1 % GEL APPLY 2 GRAMS  TOPICALLY 4 TIMES DAILY (Patient taking differently: Apply 2 g topically 4 (four) times daily as needed (shoulder pain.).) 200  g 0   fluticasone (FLONASE) 50 MCG/ACT nasal spray SPRAY 2 SPRAYS INTO EACH NOSTRIL EVERY DAY 48 mL 1   hydrochlorothiazide (HYDRODIURIL) 25 MG tablet Take 1 tablet (25 mg total) by mouth daily. 90 tablet 3   hydrocortisone (ANUSOL-HC) 2.5 % rectal cream Place 1 Application rectally 2 (two) times daily as needed for hemorrhoids. 28 g 2   pantoprazole (PROTONIX) 40 MG tablet TAKE 1 TABLET BY MOUTH TWICE A DAY 60 tablet 5   spironolactone (ALDACTONE) 50 MG tablet Take 1 tablet (50 mg total) by mouth daily.     ezetimibe (ZETIA) 10 MG tablet Take 1 tablet (10 mg total) by mouth daily. 90 tablet 3   isosorbide mononitrate (IMDUR) 30 MG 24 hr tablet Take 1 tablet (30 mg total) by mouth daily. 90 tablet 3   Facility-Administered Medications Prior to Visit  Medication Dose Route Frequency Provider Last Rate Last Admin   triamcinolone acetonide (KENALOG-40)  injection 20 mg  20 mg Other Once Felecia Shelling, DPM         Review of Systems:   Constitutional: No weight loss or gain, night sweats, fevers, chills, or lassitude. +daytime fatigue  HEENT: No headaches, difficulty swallowing, tooth/dental problems, or sore throat. No sneezing, itching, ear ache, nasal congestion, or post nasal drip CV:  No chest pain, orthopnea, PND, swelling in lower extremities, anasarca, dizziness, palpitations, syncope Resp: +snoring, shortness of breath with exertion. No excess mucus or change in color of mucus. No productive or non-productive. No hemoptysis. No wheezing.  No chest wall deformity GI:  +heartburn, indigestion. No abdominal pain, nausea, vomiting, diarrhea, change in bowel habits, loss of appetite, bloody stools.  GU: No dysuria, change in color of urine, urgency or frequency.  Skin: No rash, lesions, ulcerations MSK:  No joint pain or swelling.   Neuro: No dizziness or lightheadedness.  Psych: No depression. +stable anxiety. Mood stable. +sleep disturbance    Physical Exam:  BP 130/88 (BP Location:  Left Arm, Patient Position: Sitting, Cuff Size: Large)   Pulse 80   Temp 97.6 F (36.4 C) (Oral)   Ht 5\' 7"  (1.702 m)   Wt 216 lb 9.6 oz (98.2 kg)   SpO2 95%   BMI 33.92 kg/m   GEN: Pleasant, interactive; obese; in no acute distress. HEENT:  Normocephalic and atraumatic. PERRLA. Sclera white. Nasal turbinates pink, moist and patent bilaterally. No rhinorrhea present. Oropharynx pink and moist, without exudate or edema. No lesions, ulcerations, or postnasal drip. Mallampati II/III NECK:  Supple w/ fair ROM. No JVD present. Normal carotid impulses w/o bruits. Thyroid symmetrical with no goiter or nodules palpated. No lymphadenopathy.   CV: RRR, no m/r/g, no peripheral edema. Pulses intact, +2 bilaterally. No cyanosis, pallor or clubbing. PULMONARY:  Unlabored, regular breathing. Clear bilaterally A&P w/o wheezes/rales/rhonchi. No accessory muscle use.  GI: BS present and normoactive. Soft, non-tender to palpation. No organomegaly or masses detected.  MSK: No erythema, warmth or tenderness. Cap refil <2 sec all extrem. No deformities or joint swelling noted.  Neuro: A/Ox3. No focal deficits noted.   Skin: Warm, no lesions or rashe Psych: Normal affect and behavior. Judgement and thought content appropriate.     Lab Results:  CBC    Component Value Date/Time   WBC 7.7 11/28/2022 0947   RBC 5.38 11/28/2022 0947   HGB 16.6 11/28/2022 0947   HGB 16.1 04/09/2021 1605   HCT 49.1 11/28/2022 0947   HCT 47.2 04/09/2021 1605   PLT 215 11/28/2022 0947   PLT 252 04/09/2021 1605   MCV 91.3 11/28/2022 0947   MCV 88 04/09/2021 1605   MCH 30.9 11/28/2022 0947   MCHC 33.8 11/28/2022 0947   RDW 12.1 11/28/2022 0947   RDW 12.6 04/09/2021 1605   LYMPHSABS 1,825 11/28/2022 0947   MONOABS 0.5 06/26/2021 1726   EOSABS 46 11/28/2022 0947   BASOSABS 31 11/28/2022 0947    BMET    Component Value Date/Time   NA 141 06/09/2023 0958   K 4.2 06/09/2023 0958   CL 100 06/09/2023 0958   CO2 26  06/09/2023 0958   GLUCOSE 94 06/09/2023 0958   GLUCOSE 104 (H) 11/28/2022 0947   BUN 15 06/09/2023 0958   CREATININE 1.15 06/09/2023 0958   CREATININE 1.04 11/28/2022 0947   CALCIUM 9.2 06/09/2023 0958   GFRNONAA >60 07/29/2022 1436   GFRAA 89 05/23/2020 1456    BNP No results found for: "BNP"   Imaging:  No results found.  Administration History     None           No data to display          No results found for: "NITRICOXIDE"      Assessment & Plan:      Moderate obstructive sleep apnea Moderate OSA with significant oxygen desaturations. Suboptimal compliance with CPAP. He is having difficulties with humidity and pressure settings. Will adjust him to auto 8-12 cmH2O. Advised him to adjust his humidity level up on his machine at home. I will also set him up for a mask fitting at [facility], which he will attend if difficulties persist despite adjustments. Aware of risks of untreated OSA. He has received benefit when utilizing device. Safe driving practices reviewed.   Patient Instructions  Increase CPAP use to every night, minimum of 4-6 hours a night.  Change equipment as directed. Wash your tubing with warm soap and water daily, hang to dry. Wash humidifier portion weekly. Use bottled, distilled water and change daily Be aware of reduced alertness and do not drive or operate heavy machinery if experiencing this or drowsiness.  Exercise encouraged, as tolerated. Healthy weight management discussed.  Avoid or decrease alcohol consumption and medications that make you more sleepy, if possible. Notify if persistent daytime sleepiness occurs even with consistent use of PAP therapy.  We discussed how untreated sleep apnea puts an individual at risk for cardiac arrhthymias, pulm HTN, DM, stroke and increases their risk for daytime accidents. We also briefly reviewed treatment options including weight loss, side sleeping position, oral appliance, CPAP therapy or referral to  ENT for possible surgical options  Adjust CPAP settings to 8-12 cmH2O  Look at adjusting your humidity settings  Mask fitting  Follow up in 6 weeks with Dr. Wynona Neat or Florentina Addison Tracie Dore,NP, or sooner, if needed    Nocturnal hypoxia Will need to obtain ONO on CPAP/room air once utilizing CPAP on a consistent basis. Reassess use at follow up to determine timing. May need CPAP titration if difficulties persist or if he continues to have significant oxygen desaturations despite CPAP use. Goal >88-90%  Advised if symptoms do not improve or worsen, to please contact office for sooner follow up or seek emergency care.   I spent 35 minutes of dedicated to the care of this patient on the date of this encounter to include pre-visit review of records, face-to-face time with the patient discussing conditions above, post visit ordering of testing, clinical documentation with the electronic health record, making appropriate referrals as documented, and communicating necessary findings to members of the patients care team.  Noemi Chapel, NP 10/06/2023  Pt aware and understands NP's role.

## 2023-10-06 ENCOUNTER — Encounter: Payer: Self-pay | Admitting: Nurse Practitioner

## 2023-10-06 NOTE — Assessment & Plan Note (Signed)
Moderate OSA with significant oxygen desaturations. Suboptimal compliance with CPAP. He is having difficulties with humidity and pressure settings. Will adjust him to auto 8-12 cmH2O. Advised him to adjust his humidity level up on his machine at home. I will also set him up for a mask fitting at [facility], which he will attend if difficulties persist despite adjustments. Aware of risks of untreated OSA. He has received benefit when utilizing device. Safe driving practices reviewed.   Patient Instructions  Increase CPAP use to every night, minimum of 4-6 hours a night.  Change equipment as directed. Wash your tubing with warm soap and water daily, hang to dry. Wash humidifier portion weekly. Use bottled, distilled water and change daily Be aware of reduced alertness and do not drive or operate heavy machinery if experiencing this or drowsiness.  Exercise encouraged, as tolerated. Healthy weight management discussed.  Avoid or decrease alcohol consumption and medications that make you more sleepy, if possible. Notify if persistent daytime sleepiness occurs even with consistent use of PAP therapy.  We discussed how untreated sleep apnea puts an individual at risk for cardiac arrhthymias, pulm HTN, DM, stroke and increases their risk for daytime accidents. We also briefly reviewed treatment options including weight loss, side sleeping position, oral appliance, CPAP therapy or referral to ENT for possible surgical options  Adjust CPAP settings to 8-12 cmH2O  Look at adjusting your humidity settings  Mask fitting  Follow up in 6 weeks with Dr. Wynona Neat or Philis Nettle, or sooner, if needed

## 2023-10-06 NOTE — Assessment & Plan Note (Signed)
Will need to obtain ONO on CPAP/room air once utilizing CPAP on a consistent basis. Reassess use at follow up to determine timing. May need CPAP titration if difficulties persist or if he continues to have significant oxygen desaturations despite CPAP use. Goal >88-90%

## 2023-10-14 ENCOUNTER — Other Ambulatory Visit: Payer: Self-pay

## 2023-10-14 ENCOUNTER — Ambulatory Visit: Payer: Medicaid Other | Admitting: Student

## 2023-10-14 ENCOUNTER — Emergency Department (HOSPITAL_COMMUNITY): Payer: Medicaid Other

## 2023-10-14 ENCOUNTER — Encounter (HOSPITAL_COMMUNITY): Payer: Self-pay | Admitting: Emergency Medicine

## 2023-10-14 ENCOUNTER — Encounter: Payer: Self-pay | Admitting: Student

## 2023-10-14 ENCOUNTER — Emergency Department (HOSPITAL_COMMUNITY)
Admission: EM | Admit: 2023-10-14 | Discharge: 2023-10-14 | Disposition: A | Discharge status: Home / Self Care | Payer: Medicaid Other | Attending: Emergency Medicine | Admitting: Emergency Medicine

## 2023-10-14 VITALS — BP 124/90 | HR 114 | Temp 98.0°F | Ht 67.0 in | Wt 209.8 lb

## 2023-10-14 DIAGNOSIS — J111 Influenza due to unidentified influenza virus with other respiratory manifestations: Secondary | ICD-10-CM | POA: Diagnosis not present

## 2023-10-14 DIAGNOSIS — F13939 Sedative, hypnotic or anxiolytic use, unspecified with withdrawal, unspecified: Secondary | ICD-10-CM | POA: Insufficient documentation

## 2023-10-14 DIAGNOSIS — F13930 Sedative, hypnotic or anxiolytic use, unspecified with withdrawal, uncomplicated: Secondary | ICD-10-CM

## 2023-10-14 DIAGNOSIS — F1111 Opioid abuse, in remission: Secondary | ICD-10-CM | POA: Diagnosis not present

## 2023-10-14 DIAGNOSIS — I10 Essential (primary) hypertension: Secondary | ICD-10-CM

## 2023-10-14 DIAGNOSIS — F419 Anxiety disorder, unspecified: Secondary | ICD-10-CM | POA: Diagnosis not present

## 2023-10-14 DIAGNOSIS — Z20822 Contact with and (suspected) exposure to covid-19: Secondary | ICD-10-CM | POA: Diagnosis not present

## 2023-10-14 DIAGNOSIS — F1110 Opioid abuse, uncomplicated: Secondary | ICD-10-CM

## 2023-10-14 DIAGNOSIS — R051 Acute cough: Secondary | ICD-10-CM | POA: Diagnosis not present

## 2023-10-14 LAB — COMPREHENSIVE METABOLIC PANEL
ALT: 20 U/L (ref 0–44)
AST: 23 U/L (ref 15–41)
Albumin: 4.3 g/dL (ref 3.5–5.0)
Alkaline Phosphatase: 59 U/L (ref 38–126)
Anion gap: 12 (ref 5–15)
BUN: 22 mg/dL — ABNORMAL HIGH (ref 6–20)
CO2: 26 mmol/L (ref 22–32)
Calcium: 9.8 mg/dL (ref 8.9–10.3)
Chloride: 96 mmol/L — ABNORMAL LOW (ref 98–111)
Creatinine, Ser: 1.37 mg/dL — ABNORMAL HIGH (ref 0.61–1.24)
GFR, Estimated: 60 mL/min — ABNORMAL LOW (ref >60)
Glucose, Bld: 101 mg/dL — ABNORMAL HIGH (ref 70–99)
Potassium: 4.3 mmol/L (ref 3.5–5.1)
Sodium: 134 mmol/L — ABNORMAL LOW (ref 135–145)
Total Bilirubin: 0.9 mg/dL (ref 0.0–1.2)
Total Protein: 8.5 g/dL — ABNORMAL HIGH (ref 6.5–8.1)

## 2023-10-14 LAB — CBC
HCT: 50.7 % (ref 39.0–52.0)
Hemoglobin: 17.3 g/dL — ABNORMAL HIGH (ref 13.0–17.0)
MCH: 30.8 pg (ref 26.0–34.0)
MCHC: 34.1 g/dL (ref 30.0–36.0)
MCV: 90.2 fL (ref 80.0–100.0)
Platelets: 220 10*3/uL (ref 150–400)
RBC: 5.62 MIL/uL (ref 4.22–5.81)
RDW: 13.2 % (ref 11.5–15.5)
WBC: 10.1 10*3/uL (ref 4.0–10.5)
nRBC: 0 % (ref 0.0–0.2)

## 2023-10-14 LAB — RESP PANEL BY RT-PCR (RSV, FLU A&B, COVID)  RVPGX2
Influenza A by PCR: POSITIVE — AB
Influenza B by PCR: NEGATIVE
Resp Syncytial Virus by PCR: NEGATIVE
SARS Coronavirus 2 by RT PCR: NEGATIVE

## 2023-10-14 MED ORDER — HYDROXYZINE HCL 10 MG PO TABS: 10.0000 mg | ORAL_TABLET | Freq: Three times a day (TID) | ORAL | 0 refills | Status: DC | PRN

## 2023-10-14 MED ORDER — DIAZEPAM 5 MG PO TABS: ORAL_TABLET | ORAL | 0 refills | Status: DC

## 2023-10-14 MED ORDER — SPIRONOLACTONE 50 MG PO TABS: 50.0000 mg | ORAL_TABLET | Freq: Every day | ORAL | 11 refills | Status: DC

## 2023-10-14 MED ORDER — KETOROLAC TROMETHAMINE 15 MG/ML IJ SOLN
15.0000 mg | Freq: Once | INTRAMUSCULAR | Status: AC
  Administered 2023-10-14: 15 mg via INTRAMUSCULAR
  Filled 2023-10-14: qty 1

## 2023-10-14 MED ORDER — ONDANSETRON 4 MG PO TBDP
4.0000 mg | ORAL_TABLET | Freq: Once | ORAL | Status: AC
  Administered 2023-10-14: 4 mg via ORAL
  Filled 2023-10-14: qty 1

## 2023-10-14 MED ORDER — FLUOXETINE HCL 10 MG PO CAPS: 10.0000 mg | ORAL_CAPSULE | Freq: Every day | ORAL | 2 refills | Status: DC

## 2023-10-14 MED ORDER — ISOSORBIDE MONONITRATE ER 30 MG PO TB24: 30.0000 mg | ORAL_TABLET | Freq: Every day | ORAL | 3 refills | Status: DC

## 2023-10-14 MED ORDER — CHLORDIAZEPOXIDE HCL 25 MG PO CAPS
50.0000 mg | ORAL_CAPSULE | Freq: Once | ORAL | Status: AC
  Administered 2023-10-14: 50 mg via ORAL
  Filled 2023-10-14: qty 2

## 2023-10-14 MED ORDER — ONDANSETRON HCL 4 MG PO TABS: 4.0000 mg | ORAL_TABLET | Freq: Four times a day (QID) | ORAL | 0 refills | Status: DC | PRN

## 2023-10-14 MED ORDER — BUPRENORPHINE HCL-NALOXONE HCL 8-2 MG SL FILM: ORAL_FILM | SUBLINGUAL | 0 refills | Status: DC

## 2023-10-14 MED ORDER — CHLORDIAZEPOXIDE HCL 25 MG PO CAPS: ORAL_CAPSULE | ORAL | 0 refills | Status: DC

## 2023-10-14 NOTE — Assessment & Plan Note (Signed)
 Patient has a history of opioid use disorder.  He has been on Suboxone  for some time now.  His current dose includes Suboxone  8-2 mg twice daily.  He denies any relapses, cravings, or withdrawal symptoms.  He states he otherwise is doing well with his Suboxone .  The concern right now is he is obtaining Xanax illegally, and should not be taking this with Suboxone .  Did talk to the patient about this and stated that he should not be taking Xanax with Suboxone .  Patient understands this.  Plan: -Refill Suboxone  8-2 mg twice daily for only 30 days -Follow-up closely in 1 month -Tox assure pending

## 2023-10-14 NOTE — Patient Instructions (Addendum)
 [name], Thank you for allowing me to take part in your care today.  Here are your instructions.  1.  I am sending you to the emergency department for benzo withdrawal.  2.  I have sent you in fluoxetine , this will help prevent anxiety attacks.  Please take fluoxetine  daily.  I have also sent in hydroxyzine , this will help rescue you from panic attacks.  If you feel a panic attack coming on, please take hydroxyzine .  You can take it up to 3 times a day.  3.  I sent you in Valium  to help taper you off of Xanax.  Please take Valium  as instructed below. Take Valium  20 mg daily for the next 7 days followed by 15 mg for the following 7 days, followed by 10 mg for the following 7 days, followed by 5 mg for the following 7 days.  After the 5 mg dose you can stop taking it will not need anymore.  4.  Do not mix this with Xanax.  5.  I refilled your Suboxone .  Come back in 1 month.  6.  I have swabbed you for COVID, flu, and RSV.  I will call you with results.  7.  Please stay away from Xanax.  Thank you, Dr. Tobie  If you have any other questions please contact the internal medicine clinic at [phone] If it is after hours, please call the [facility] at [phone] and then ask the person who picks up for the resident on call.

## 2023-10-14 NOTE — Assessment & Plan Note (Addendum)
 Patient endorses longstanding history of anxiety.  He states he has tried Cymbalta , BuSpar , and Zoloft  in the past.  He states nothing has helped.  He stated that in the past he was on Xanax, and was discontinued on this, but decided to go through other avenues to obtain Xanax.  He has been without Xanax for 5 days now.  He reports he is always anxious.  His GAD-7 today is 18.  On exam, he does look uncomfortable.  He looks anxious.  Given elevated GAD-7, I think he would be a proper candidate for trying something such as fluoxetine .  Patient agrees with this plan.  Plan: -Start fluoxetine  10 mg daily -Start hydroxyzine  10 mg 3 times daily as needed -Follow-up in 1 month -Patient encouraged to stay away from Xanax -If fluoxetine  does not work, likely need to go to psychiatry as he has failed multiple SSRIs and SNRIs

## 2023-10-14 NOTE — Progress Notes (Signed)
 Pt accompanied to the ER for medication withdrawal via w/c.

## 2023-10-14 NOTE — ED Provider Notes (Signed)
 [place] EMERGENCY DEPARTMENT AT [facility] Provider Note   CSN: [id number] Arrival date & time: 10/14/23  1445     History Chief Complaint  Patient presents with   Withdrawal    HPI [name] is a 58 y.o. male presenting for chief complaint of withdrawal.  States that 58 year old male who has been using benzodiazepines for years that he gets from a family member. Denies chills nausea vomiting syncope shortness of breath at this time.  However he started having some fever cough congestion 6 days ago.  Patient's recorded medical, surgical, social, medication list and allergies were reviewed in the Snapshot window as part of the initial history.   Review of Systems   Review of Systems  Constitutional:  Positive for fever. Negative for chills.  HENT:  Negative for ear pain and sore throat.   Eyes:  Negative for pain and visual disturbance.  Respiratory:  Positive for cough. Negative for shortness of breath.   Cardiovascular:  Negative for chest pain and palpitations.  Gastrointestinal:  Negative for abdominal pain and vomiting.  Genitourinary:  Negative for dysuria and hematuria.  Musculoskeletal:  Negative for arthralgias and back pain.  Skin:  Negative for color change and rash.  Neurological:  Negative for seizures and syncope.  All other systems reviewed and are negative.   Physical Exam Updated Vital Signs BP (!) 152/79 (BP Location: Right Arm)   Pulse (!) 111   Temp 99.6 F (37.6 C) (Oral)   Resp 15   Ht 5' 7 (1.702 m)   Wt 95.3 kg   SpO2 95%   BMI 32.89 kg/m  Physical Exam Vitals and nursing note reviewed.  Constitutional:      General: He is not in acute distress.    Appearance: He is well-developed.  HENT:     Head: Normocephalic and atraumatic.  Eyes:     Conjunctiva/sclera: Conjunctivae normal.  Cardiovascular:     Rate and Rhythm: Normal rate and regular rhythm.  Pulmonary:     Effort: Pulmonary effort is normal. No respiratory  distress.  Abdominal:     General: Abdomen is flat. There is no distension.  Musculoskeletal:        General: No swelling or deformity.  Skin:    General: Skin is warm and dry.     Capillary Refill: Capillary refill takes less than 2 seconds.  Neurological:     Mental Status: He is alert and oriented to person, place, and time. Mental status is at baseline.      ED Course/ Medical Decision Making/ A&P    Procedures Procedures   Medications Ordered in ED Medications  ondansetron  (ZOFRAN -ODT) disintegrating tablet 4 mg (has no administration in time range)  chlordiazePOXIDE  (LIBRIUM ) capsule 50 mg (has no administration in time range)  ketorolac  (TORADOL ) 15 MG/ML injection 15 mg (has no administration in time range)    Medical Decision Making:   [name] is a 58 y.o. male who presented to the ED today with subjective fever, cough, congestion detailed above.    Patient placed on continuous vitals and telemetry monitoring while in ED which was reviewed periodically.   Complete initial physical exam performed, notably the patient  was hemodynamically stable in no acute distress.  Posterior oropharynx illuminated and without obvious swelling or deformity.  Patient is without neck stiffness.    Reviewed and confirmed nursing documentation for past medical history, family history, social history.    Initial Assessment:   With the  patient's presentation of fever cough congestion, most likely diagnosis is developing viral upper respiratory infection. Other diagnoses were considered including (but not limited to) peritonsillar abscess, retropharyngeal abscess, pneumonia. These are considered less likely due to history of present illness and physical exam findings.   This is most consistent with an acute life/limb threatening illness complicated by underlying chronic conditions. Considered meningitis, however patient's symptoms, vital signs, physical exam findings including lack of  meningismus seem grossly less consistent at this time. Initial Plan:  Screening labs including CBC and Metabolic panel to evaluate for infectious or metabolic etiology of disease.  Viral screening including COVID/flu testing to evaluate for common viral etiologies that need to be tracked CXR to evaluate for structural/infectious intrathoracic pathology.  Empiric treatment with antipyretics including acetaminophen  in ambulatory setting Objective evaluation as below reviewed   Initial Study Results:   Laboratory  All laboratory results reviewed without evidence of clinically relevant pathology.    Radiology:  All images reviewed independently. Agree with radiology report at this time.   DG Chest Portable 1 View  Final Result         Final Assessment and Plan:   On reassessment, patient is ambulatory tolerating p.o. intake in no acute distress.   Patient has flu.  We are treating him for symptomatic management as he is past the timeline for Tamiflu initiation. Concerning his withdrawal complaint, will start him on a Librium  taper. Patient is currently stable for outpatient care and management with no indication for hospitalization or transfer at this time.  Discussed all findings with patient expressed understanding.  Disposition:  Based on the above findings, I believe patient is stable for discharge.    Patient/family educated about specific return precautions for given chief complaint and symptoms.  Patient/family educated about follow-up with PCP.     Patient/family expressed understanding of return precautions and need for follow-up. Patient spoken to regarding all imaging and laboratory results and appropriate follow up for these results. All education provided in verbal form with additional information in written form. Time was allowed for answering of patient questions. Patient discharged.    Emergency Department Medication Summary:   Medications  ondansetron  (ZOFRAN -ODT)  disintegrating tablet 4 mg (has no administration in time range)  chlordiazePOXIDE  (LIBRIUM ) capsule 50 mg (has no administration in time range)  ketorolac  (TORADOL ) 15 MG/ML injection 15 mg (has no administration in time range)   Clinical Impression:  1. 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

## 2023-10-14 NOTE — Assessment & Plan Note (Signed)
 Patient presents to the clinic today hypertensive as well as tachycardic.  He reports that he is not feeling well.  He states that he normally gets Xanax from his aunt, and his aunt ran out 5 days ago.  He states that in the past he has also had these symptoms when he has not had Xanax.  Of the most recent encounter, he states in December when his aunt ran out of Xanax, he felt the same way and nothing made him feel better until he got the Xanax back from his aunt.  He endorses that he has been on Xanax in the past and was not able to get off of it once they discontinued and he started seeking other avenues to obtain it.  On exam, patient is uncomfortable, anxious, tremulous, tachycardic, and tachypneic.  He is also hypertensive.  Will obtain Urine tox today to see if patient is using on the other substances.  He has not had seizures in the past.  He has never had benzo withdrawal seizures.  I am worried today that he is unable to drive home safely.  When discussing treatment options, the plan we came up with was for him to go to the emergency department to get first dose of his Valium , and once he is feeling a little bit better he can go home and pick up his prescription for Valium .  Plan will be to taper off using Valium  to avoid withdrawal symptoms.  Patient agreed with this plan.  He was sent up to the emergency department.  Plan: -Worried about benzodiazepine withdrawal, sent to emergency department to obtain first dose of Valium  -Valium  taper sent to pharmacy, plan will be to do 20 mg for 7 days followed by 15 mg for 7 days followed by 10 mg for 7 days followed by 5 mg for 7 days -U-Tox pending -Patient encouraged to remain away from Xanax

## 2023-10-14 NOTE — Progress Notes (Signed)
 CC: OUD follow-up and concern for benzo withdrawal symptoms  HPI:  [name] is a 58 y.o. male with a past medical history of hypertension, COPD, OSA on CPAP, OUD who presents for OUD appointment and acute concerns for benzo withdrawal symptoms.  Please see assessment and plan for full HPI.  Medications: COPD: Albuterol  2 puffs every 6 hours as needed Anxiety: Fluoxetine  10 mg daily, hydroxyzine  10 mg 3 times daily as needed OUD: Suboxone  1 strip under tongue twice daily Allergic rhinitis: Flonase  Hypertension: HCTZ 25 mg daily, Imdur  30 mg daily, spironolactone  50 mg daily GERD: Protonix  40 mg twice daily  Past Medical History:  Diagnosis Date   Acute pancreatitis    Anxiety    Arthritis    Colonic adenoma    COPD (chronic obstructive pulmonary disease) (HCC)    Depression    Diverticulitis of colon with perforation 08/2010   GERD (gastroesophageal reflux disease)    History of hiatal hernia    Hypertension    Polysubstance abuse (HCC)    Renal insufficiency    Sleep apnea    Intolerant of CPAP   Stroke (HCC)    TIA (transient ischemic attack)      Current Facility-Administered Medications:    triamcinolone  acetonide (KENALOG -40) injection 20 mg, 20 mg, Other, Once, Evans, Brent M, DPM  Current Outpatient Medications:    diazepam  (VALIUM ) 5 MG tablet, Take 4 tablets (20 mg total) by mouth daily for 7 days, THEN 3 tablets (15 mg total) daily for 7 days, THEN 2 tablets (10 mg total) daily for 7 days, THEN 1 tablet (5 mg total) daily for 7 days., Disp: 70 tablet, Rfl: 0   FLUoxetine  (PROZAC ) 10 MG capsule, Take 1 capsule (10 mg total) by mouth daily., Disp: 30 capsule, Rfl: 2   hydrOXYzine  (ATARAX ) 10 MG tablet, Take 1 tablet (10 mg total) by mouth 3 (three) times daily as needed., Disp: 30 tablet, Rfl: 0   acetaminophen  (TYLENOL ) 500 MG tablet, Take 1,000 mg by mouth in the morning and at bedtime., Disp: , Rfl:    albuterol  (VENTOLIN  HFA) 108 (90 Base) MCG/ACT  inhaler, Inhale 2 puffs into the lungs every 6 (six) hours as needed for wheezing or shortness of breath., Disp: 18 g, Rfl: 2   Buprenorphine  HCl-Naloxone  HCl 8-2 MG FILM, PLACE 1 STRIP UNDER THE TONGUE TWICE DAILY, Disp: 60 each, Rfl: 0   cycloSPORINE (RESTASIS) 0.05 % ophthalmic emulsion, Place 1 drop into both eyes 2 (two) times daily., Disp: , Rfl:    diclofenac  Sodium (VOLTAREN ) 1 % GEL, APPLY 2 GRAMS  TOPICALLY 4 TIMES DAILY (Patient taking differently: Apply 2 g topically 4 (four) times daily as needed (shoulder pain.).), Disp: 200 g, Rfl: 0   fluticasone  (FLONASE ) 50 MCG/ACT nasal spray, SPRAY 2 SPRAYS INTO EACH NOSTRIL EVERY DAY, Disp: 48 mL, Rfl: 1   hydrochlorothiazide  (HYDRODIURIL ) 25 MG tablet, Take 1 tablet (25 mg total) by mouth daily., Disp: 90 tablet, Rfl: 3   hydrocortisone  (ANUSOL -HC) 2.5 % rectal cream, Place 1 Application rectally 2 (two) times daily as needed for hemorrhoids., Disp: 28 g, Rfl: 2   isosorbide  mononitrate (IMDUR ) 30 MG 24 hr tablet, Take 1 tablet (30 mg total) by mouth daily., Disp: 90 tablet, Rfl: 3   pantoprazole  (PROTONIX ) 40 MG tablet, TAKE 1 TABLET BY MOUTH TWICE A DAY, Disp: 60 tablet, Rfl: 5   spironolactone  (ALDACTONE ) 50 MG tablet, Take 1 tablet (50 mg total) by mouth daily., Disp: 30 tablet,  Rfl: 11  Review of Systems:   Constitutional: Patient endorses fevers Respiratory: Patient endorses cough and shortness of breath Cardiovascular: Patient denies chest pain Psych: Patient endorses anxiety  Physical Exam:  Vitals:   10/14/23 1335 10/14/23 1402  BP: (!) 139/108 (!) 124/90  Pulse: (!) 112 (!) 114  Temp: 98 F (36.7 C)   TempSrc: Oral   SpO2: 99%   Weight: 209 lb 12.8 oz (95.2 kg)   Height: 5' 7 (1.702 m)    General: Patient is sitting comfortably in the room  Eyes: Pupils equal and reactive to light, EOMI Cardio: Tachycardic rate, regular rhythm, no murmurs, rubs, or gallops Pulmonary: Clear to ausculation bilaterally with no rales,  rhonchi, and crackles  Psych: Anxious appearing Neuro: Tremulous  Assessment & Plan:   Benzodiazepine withdrawal (HCC) Patient presents to the clinic today hypertensive as well as tachycardic.  He reports that he is not feeling well.  He states that he normally gets Xanax from his aunt, and his aunt ran out 5 days ago.  He states that in the past he has also had these symptoms when he has not had Xanax.  Of the most recent encounter, he states in December when his aunt ran out of Xanax, he felt the same way and nothing made him feel better until he got the Xanax back from his aunt.  He endorses that he has been on Xanax in the past and was not able to get off of it once they discontinued and he started seeking other avenues to obtain it.  On exam, patient is uncomfortable, anxious, tremulous, tachycardic, and tachypneic.  He is also hypertensive.  Will obtain Urine tox today to see if patient is using on the other substances.  He has not had seizures in the past.  He has never had benzo withdrawal seizures.  I am worried today that he is unable to drive home safely.  When discussing treatment options, the plan we came up with was for him to go to the emergency department to get first dose of his Valium , and once he is feeling a little bit better he can go home and pick up his prescription for Valium .  Plan will be to taper off using Valium  to avoid withdrawal symptoms.  Patient agreed with this plan.  He was sent up to the emergency department.  Plan: -Worried about benzodiazepine withdrawal, sent to emergency department to obtain first dose of Valium  -Valium  taper sent to pharmacy, plan will be to do 20 mg for 7 days followed by 15 mg for 7 days followed by 10 mg for 7 days followed by 5 mg for 7 days -U-Tox pending -Patient encouraged to remain away from Xanax  Acute cough Patient reports new cough.  He states he has a sick contact and his wife who has the flu.  He reports having subjective fevers  and some shortness of breath.  On exam patient is not wheezing.  This is a dry cough.  Likely patient has a viral syndrome.  Plan: -Obtain COVID, flu, RSV swab  Opioid use disorder, mild, in sustained remission, on maintenance therapy, abuse (HCC) Patient has a history of opioid use disorder.  He has been on Suboxone  for some time now.  His current dose includes Suboxone  8-2 mg twice daily.  He denies any relapses, cravings, or withdrawal symptoms.  He states he otherwise is doing well with his Suboxone .  The concern right now is he is obtaining Xanax illegally, and should  not be taking this with Suboxone .  Did talk to the patient about this and stated that he should not be taking Xanax with Suboxone .  Patient understands this.  Plan: -Refill Suboxone  8-2 mg twice daily for only 30 days -Follow-up closely in 1 month -Tox assure pending  Anxiety Patient endorses longstanding history of anxiety.  He states he has tried Cymbalta , BuSpar , and Zoloft  in the past.  He states nothing has helped.  He stated that in the past he was on Xanax, and was discontinued on this, but decided to go through other avenues to obtain Xanax.  He has been without Xanax for 5 days now.  He reports he is always anxious.  His GAD-7 today is 18.  On exam, he does look uncomfortable.  He looks anxious.  Given elevated GAD-7, I think he would be a proper candidate for trying something such as fluoxetine .  Patient agrees with this plan.  Plan: -Start fluoxetine  10 mg daily -Start hydroxyzine  10 mg 3 times daily as needed -Follow-up in 1 month -Patient encouraged to stay away from Xanax -If fluoxetine  does not work, likely need to go to psychiatry as he has failed multiple SSRIs and SNRIs  HTN (hypertension) Patient has elevated blood pressure today.  This likely could be in the setting of benzodiazepine withdrawal.  Blood pressure today is 139/108.  Patient is also tachycardic.  Patient endorses taking spironolactone  and  HCTZ as well as Imdur .  He has no concerns at this time.  Plan: -Continue HCTZ 25 mg daily -Continue Imdur  30 mg daily -Continue spironolactone  50 mg daily  Patient seen with Dr.  Mliss Foot  Libby Blanch, DO PGY-2 Internal Medicine Resident  Pager: [phone]

## 2023-10-14 NOTE — ED Provider Triage Note (Signed)
 Emergency Medicine Provider Triage Evaluation Note  [name] , a 58 y.o. male  was evaluated in triage.  Pt complains of stomach burning, shortness of breath, general malaise.  Thinks he may be withdrawing from Xanax.  He was taking 2 mg daily.  Reports chronic abdominal pain secondary to previous intra-abdominal surgery..  Review of Systems  Positive: Shob, abdominal pain, anxiety Negative:   Physical Exam  BP (!) 152/79 (BP Location: Right Arm)   Pulse (!) 111   Temp 99.6 F (37.6 C) (Oral)   Resp 15   Ht 5' 7 (1.702 m)   Wt 95.3 kg   SpO2 95%   BMI 32.89 kg/m  Gen:   Awake, no distress   Resp:  Normal effort  MSK:   Moves extremities without difficulty  Other:  Tachycardia, normal rhythm  Medical Decision Making  Medically screening exam initiated at 3:07 PM.  Appropriate orders placed.  [name] was informed that the remainder of the evaluation will be completed by another provider, this initial triage assessment does not replace that evaluation, and the importance of remaining in the ED until their evaluation is complete.  Workup initiated in triage    Rosan Sherlean DEL, [place] 10/14/23 1508

## 2023-10-14 NOTE — ED Triage Notes (Addendum)
 Pt via POV c/o burning diffuse abdominal pain, hot and cold flashes, tremors, and severe headache x 1 week. He had been taking 2mg  xanax daily but stopped about a week ago prior to onset of symptoms. Prior withdrawal with similar symptoms.

## 2023-10-14 NOTE — Assessment & Plan Note (Signed)
 Patient reports new cough.  He states he has a sick contact and his wife who has the flu.  He reports having subjective fevers and some shortness of breath.  On exam patient is not wheezing.  This is a dry cough.  Likely patient has a viral syndrome.  Plan: -Obtain COVID, flu, RSV swab

## 2023-10-14 NOTE — Assessment & Plan Note (Addendum)
 Patient has elevated blood pressure today.  This likely could be in the setting of benzodiazepine withdrawal.  Blood pressure today is 139/108.  Patient is also tachycardic.  Patient endorses taking spironolactone  and HCTZ as well as Imdur .  He has no concerns at this time.  Plan: -Continue HCTZ 25 mg daily -Continue Imdur  30 mg daily -Continue spironolactone  50 mg daily

## 2023-10-15 ENCOUNTER — Other Ambulatory Visit: Payer: Self-pay

## 2023-10-15 ENCOUNTER — Telehealth: Payer: Self-pay | Admitting: *Deleted

## 2023-10-15 DIAGNOSIS — F1110 Opioid abuse, uncomplicated: Secondary | ICD-10-CM

## 2023-10-15 MED ORDER — DIAZEPAM 5 MG PO TABS: ORAL_TABLET | ORAL | 0 refills | Status: AC

## 2023-10-15 MED ORDER — BUPRENORPHINE HCL-NALOXONE HCL 8-2 MG SL FILM: ORAL_FILM | SUBLINGUAL | 0 refills | Status: DC

## 2023-10-15 MED ORDER — DIAZEPAM 5 MG PO TABS: ORAL_TABLET | ORAL | 0 refills | Status: DC

## 2023-10-15 NOTE — Telephone Encounter (Signed)
 Call from patient would like to get a call from the doctor to tell him how he is to take his Valium  ans Librium .  Wants to know if he is supposed to take together.

## 2023-10-15 NOTE — Telephone Encounter (Signed)
 Pharmacy requesting a new rx for Diazepam , the recent rx that was sent needs to be sent by a different provider.

## 2023-10-15 NOTE — Progress Notes (Signed)
 Internal Medicine Clinic Attending  I was physically present during the key portions of the resident provided service and participated in the medical decision making of patient's management care. I reviewed pertinent patient test results.  The assessment, diagnosis, and plan were formulated together and I agree with the documentation in the resident's note.  Lovie Clarity, MD    Patient is withdrawing from Xanax- he has been taking 1mg  BID getting this from a family member who has run out.  Today, he is tachycardic, diaphoretic, and anxious.  I would like to start him on a Valium  taper, with first dose now, but unfortunately he drove himself here and lives over an hour away. He does not have anyone who can drive him home.   We will send him to the ER so that they can begin treatment for benzo withdrawal. If he's tolerating this well and safe to drive home, then I anticipate he may not need hospital admission.   We will send in a Valium  taper for him, and encouraged him not to take his family member's Xanax in the future. I agree with Dr Anthony plan for treating his underlying anxiety - fluoxetine  10mg  daily & hydroxyzine  10mg  TID

## 2023-10-15 NOTE — Telephone Encounter (Signed)
 Sent in new script for suboxone  & valium  to patient's pharmacy since Dr. Basilio Both could not be processed

## 2023-10-15 NOTE — Telephone Encounter (Signed)
 Spoke with Dr. Lovie about patient's question about taking the Valium  and the Librium .  Patient is only to take the Valium . Unable to reach patient at his number or to leave a message.  Call to Pharmacy patient has not picked up medications ordered.  Pharmacy to discontinue the Librium  prescription.  Pharmacist also said that the prescriptions for Buprenorphine  and the Valium  will need to be sent in by another doctor.  Dr. Anthony prescriptions for those 2 meds could not be filled.

## 2023-10-17 LAB — TOXASSURE SELECT,+ANTIDEPR,UR

## 2023-10-18 ENCOUNTER — Encounter: Payer: Self-pay | Admitting: Student

## 2023-10-27 ENCOUNTER — Telehealth: Payer: Self-pay | Admitting: *Deleted

## 2023-10-27 DIAGNOSIS — G510 Bell's palsy: Secondary | ICD-10-CM | POA: Diagnosis not present

## 2023-10-27 DIAGNOSIS — R200 Anesthesia of skin: Secondary | ICD-10-CM | POA: Diagnosis not present

## 2023-10-27 DIAGNOSIS — R519 Headache, unspecified: Secondary | ICD-10-CM | POA: Diagnosis not present

## 2023-10-27 NOTE — Telephone Encounter (Signed)
Call from pt c/o left side facial drooping;stated he just noticed when he looked in the mirror. Unable to chew. Unable to open his right eye. Right side w/o any problems. I told pt to call 911 now. Stated he's in the car, leaving work and going home. I told pt not to go home; stopped the car and call 911. Pt stated he's close to the [facility]). I told pt to go directly to the ER - stated he will.

## 2023-11-04 DIAGNOSIS — G4733 Obstructive sleep apnea (adult) (pediatric): Secondary | ICD-10-CM | POA: Diagnosis not present

## 2023-11-06 ENCOUNTER — Other Ambulatory Visit: Payer: Self-pay | Admitting: Student

## 2023-11-06 NOTE — Telephone Encounter (Signed)
 Pharmacy requesting a 90 day supply  Medication sent to pharmacy.

## 2023-11-11 ENCOUNTER — Ambulatory Visit: Admitting: Student

## 2023-11-11 ENCOUNTER — Telehealth: Payer: Self-pay | Admitting: *Deleted

## 2023-11-11 ENCOUNTER — Encounter: Payer: Self-pay | Admitting: Student

## 2023-11-11 ENCOUNTER — Ambulatory Visit: Payer: Self-pay | Admitting: Student

## 2023-11-11 VITALS — BP 129/89 | HR 98 | Temp 98.0°F | Ht 67.0 in | Wt 210.2 lb

## 2023-11-11 DIAGNOSIS — F1111 Opioid abuse, in remission: Secondary | ICD-10-CM | POA: Diagnosis not present

## 2023-11-11 DIAGNOSIS — H6692 Otitis media, unspecified, left ear: Secondary | ICD-10-CM

## 2023-11-11 DIAGNOSIS — G510 Bell's palsy: Secondary | ICD-10-CM

## 2023-11-11 DIAGNOSIS — F17210 Nicotine dependence, cigarettes, uncomplicated: Secondary | ICD-10-CM

## 2023-11-11 DIAGNOSIS — F1110 Opioid abuse, uncomplicated: Secondary | ICD-10-CM

## 2023-11-11 DIAGNOSIS — F419 Anxiety disorder, unspecified: Secondary | ICD-10-CM | POA: Diagnosis not present

## 2023-11-11 DIAGNOSIS — Z720 Tobacco use: Secondary | ICD-10-CM

## 2023-11-11 MED ORDER — BUPRENORPHINE HCL-NALOXONE HCL 8-2 MG SL FILM: ORAL_FILM | SUBLINGUAL | 1 refills | Status: DC

## 2023-11-11 MED ORDER — AMOXICILLIN-POT CLAVULANATE 875-125 MG PO TABS: 1.0000 | ORAL_TABLET | Freq: Two times a day (BID) | ORAL | 0 refills | Status: DC

## 2023-11-11 NOTE — Assessment & Plan Note (Signed)
 The patient presented with flulike symptoms and tested positive for the flu in the beginning of February.  About 2 weeks later he then presented with a facial droop and inability to close his eye.  He was evaluated at the ED CT scans and MRIs were negative for stroke.  He was started on prednisone and acyclovir as well as lubricating drops for his eyes.  Today he presents with swelling of the left side of his face and inability to open his jaw.  He is very tender to palpation on that side.  He has submandibular lymphadenopathy, as well as a pack tympanic membranes bilaterally.  He does not have any dental abscesses or tonsillar exudates.  I am worried that he may be having mastoiditis.  -Maxillofacial CT to rule out mastoiditis and new onset facial nerve palsy. -Amoxicillin clavulanic acid for 10 days -Patient was instructed to call if he is not having relief from his symptoms in 2 days after starting his antibiotics.

## 2023-11-11 NOTE — Patient Instructions (Signed)
 Thank you, [name] for allowing [place] to provide your care today.   Referrals ordered today:   Referral Orders         Ambulatory referral to Psychiatry       I have ordered the following medication/changed the following medications:   Start the following medications: Meds ordered this encounter  Medications   amoxicillin-clavulanate (AUGMENTIN) 875-125 MG tablet    Sig: Take 1 tablet by mouth 2 (two) times daily.    Dispense:  20 tablet    Refill:  0   Buprenorphine HCl-Naloxone HCl 8-2 MG FILM    Sig: PLACE 1.5 STRIPS UNDER THE TONGUE ONCE DAILY    Dispense:  45 each    Refill:  1     Follow up:  1 week or sooner if not better     Remember: Please take the antibiotic prescribed to you. If not feeling improvement in 2 days please call our office.   Please get the CT of your head so we can take a look at the level of your infection   Please take 1.5 films of suboxone daily. Follow up in 1 month for this.   Should you have any questions or concerns please call the internal medicine clinic at [phone].     Manuela Neptune, MD [facility]

## 2023-11-11 NOTE — Telephone Encounter (Signed)
 Copied from CRM [id number]. Topic: Clinical - Red Word Triage >> Nov 11, 2023  8:58 AM Dondra Prader A wrote: Red Word that prompted transfer to Nurse Triage: Patient states that he went to the ER a few weeks ago (2/18) for his eyes not being able to blink and left side of his face going numb. He has not gotten any better,  His whole jaw hurts and cannot see at night.

## 2023-11-11 NOTE — Telephone Encounter (Signed)
 Call transferred from [facility]. I talked to pt who stated he's having jaw pain and eyes watering. Dx Bell's Palsy 2/18 when he went to the ER. Appt given this afternoon with Dr Hassan Rowan @ 1415PM. Pt made aware to go to ER if symptoms worsen.

## 2023-11-11 NOTE — Assessment & Plan Note (Signed)
 Patient has a history of opioid use disorder and has been on Suboxone.  He was using Suboxone 8-2 mg twice a day.  He has been using some Xanax from his family members.  However he has decreased the amount of times that he is using this.  He understands that he should not be taking his Xanax with the Suboxone.  He has not been using his Suboxone consistently.  He will use his Suboxone once or twice a day depending on how he is feeling.  Given this we will decrease his Suboxone to 1.5 films daily.  -Suboxone 1.5 8-2mg  films daily -Sent a 1 month supply and a refill. -Tox assure today. -Follow-up in 1 month

## 2023-11-11 NOTE — Progress Notes (Signed)
 CC:  Chief Complaint  Patient presents with   Bell's Palsy    Pt recently dx'd with Bell's Palsy [facility]) c/o left side jaw pain x 2 days getting worse pain 7/10, still can't "blink" left eye, left ear pain also     HPI:  [name] is a 58 y.o. male living with a history stated below and presents today for FU on his bells palsy. Please see problem based assessment and plan for additional details.  Past Medical History:  Diagnosis Date   Acute pancreatitis    Anxiety    Arthritis    Colonic adenoma    COPD (chronic obstructive pulmonary disease) (HCC)    Depression    Diverticulitis of colon with perforation 08/2010   GERD (gastroesophageal reflux disease)    History of hiatal hernia    Hypertension    Polysubstance abuse (HCC)    Renal insufficiency    Sleep apnea    Intolerant of CPAP   Stroke (HCC)    TIA (transient ischemic attack)     Current Outpatient Medications on File Prior to Visit  Medication Sig Dispense Refill   acetaminophen (TYLENOL) 500 MG tablet Take 1,000 mg by mouth in the morning and at bedtime.     albuterol (VENTOLIN HFA) 108 (90 Base) MCG/ACT inhaler Inhale 2 puffs into the lungs every 6 (six) hours as needed for wheezing or shortness of breath. 18 g 2   cycloSPORINE (RESTASIS) 0.05 % ophthalmic emulsion Place 1 drop into both eyes 2 (two) times daily.     diazepam (VALIUM) 5 MG tablet Take 4 tablets (20 mg total) by mouth daily for 7 days, THEN 3 tablets (15 mg total) daily for 7 days, THEN 2 tablets (10 mg total) daily for 7 days, THEN 1 tablet (5 mg total) daily for 7 days. 70 tablet 0   diclofenac Sodium (VOLTAREN) 1 % GEL APPLY 2 GRAMS  TOPICALLY 4 TIMES DAILY (Patient taking differently: Apply 2 g topically 4 (four) times daily as needed (shoulder pain.).) 200 g 0   FLUoxetine (PROZAC) 10 MG capsule TAKE 1 CAPSULE BY MOUTH EVERY DAY 90 capsule 1   fluticasone (FLONASE) 50 MCG/ACT nasal spray SPRAY 2 SPRAYS INTO EACH NOSTRIL  EVERY DAY 48 mL 1   hydrochlorothiazide (HYDRODIURIL) 25 MG tablet Take 1 tablet (25 mg total) by mouth daily. 90 tablet 3   hydrocortisone (ANUSOL-HC) 2.5 % rectal cream Place 1 Application rectally 2 (two) times daily as needed for hemorrhoids. 28 g 2   hydrOXYzine (ATARAX) 10 MG tablet Take 1 tablet (10 mg total) by mouth 3 (three) times daily as needed. 30 tablet 0   isosorbide mononitrate (IMDUR) 30 MG 24 hr tablet Take 1 tablet (30 mg total) by mouth daily. 90 tablet 3   ondansetron (ZOFRAN) 4 MG tablet Take 1 tablet (4 mg total) by mouth every 6 (six) hours as needed for up to 30 doses for nausea or vomiting. 12 tablet 0   pantoprazole (PROTONIX) 40 MG tablet TAKE 1 TABLET BY MOUTH TWICE A DAY 60 tablet 5   spironolactone (ALDACTONE) 50 MG tablet Take 1 tablet (50 mg total) by mouth daily. 30 tablet 11   Current Facility-Administered Medications on File Prior to Visit  Medication Dose Route Frequency Provider Last Rate Last Admin   triamcinolone acetonide (KENALOG-40) injection 20 mg  20 mg Other Once Felecia Shelling, DPM        Family History  Problem Relation Age of  Onset   Stomach cancer Father    Lung cancer Father    Throat cancer Father    Cancer Father        lung and throat cancer   Hypertension Father    Liver disease Cousin        cirrhosis, etoh   Throat cancer Paternal Uncle    Lung cancer Paternal Uncle    Colon cancer Cousin 46   Heart disease Mother    Hypertension Mother    Colon cancer Maternal Uncle    Throat cancer Paternal Uncle    Lung cancer Paternal Uncle    Throat cancer Paternal Uncle    Lung cancer Paternal Uncle     Social History   Socioeconomic History   Marital status: Single    Spouse name: Not on file   Number of children: 0   Years of education: Not on file   Highest education level: Not on file  Occupational History   Not on file  Tobacco Use   Smoking status: Every Day    Current packs/day: 1.00    Average packs/day: 1 pack/day  for 37.0 years (37.0 ttl pk-yrs)    Types: Cigarettes    Passive exposure: Current   Smokeless tobacco: Former   Tobacco comments:    Trying to quit, has nicotine patches at home which have helped him quit before.  Smokes a little less than a pack a day.  Updated 07/13/2023.  Vaping Use   Vaping status: Never Used  Substance and Sexual Activity   Alcohol use: Not Currently   Drug use: Yes    Types: Marijuana    Comment: occasional   Sexual activity: Not on file  Other Topics Concern   Not on file  Social History Narrative   Not on file   Social Drivers of Health   Financial Resource Strain: High Risk (11/24/2022)   Overall Financial Resource Strain (CARDIA)    Difficulty of Paying Living Expenses: Hard  Food Insecurity: Food Insecurity Present (11/24/2022)   Hunger Vital Sign    Worried About Programme researcher, broadcasting/film/video in the Last Year: Often true    Ran Out of Food in the Last Year: Often true  Transportation Needs: Not on file  Physical Activity: Not on file  Stress: Not on file  Social Connections: Not on file  Intimate Partner Violence: Not on file    Review of Systems: ROS negative except for what is noted on the assessment and plan.  Vitals:   11/11/23 1422  BP: 129/89  Pulse: 98  Temp: 98 F (36.7 C)  TempSrc: Oral  SpO2: 96%  Weight: 210 lb 3.2 oz (95.3 kg)  Height: 5\' 7"  (1.702 m)    Physical Exam: Constitutional: well-appearing, in nAD HENT: normocephalic atraumatic, mucous membranes moist, no tonsillar exudates or abscesses noted. Erythema and tenderness over the anterior and posterior ear, as well as submandibular LAD on the left. Opaque tympanic membranes bilaterally. No rhinorrhea. No gingivals abscesses noted. Eyes: conjunctiva non-erythematous Cardiovascular: regular rate and rhythm, no m/r/g, no BLE  Pulmonary/Chest: normal work of breathing on room air, lungs clear to auscultation bilaterally Abdominal: soft, non-tender, non-distended MSK: normal bulk  and tone Neurological: alert & oriented x 3, patient has a left facial droop and unable to raise left eyebrow or close left eye completely. Skin: warm and dry Psych: normal mood and behavior  Assessment & Plan:   Patient discussed with Dr. Cleda Daub  Anxiety Patient has a long  history of anxiety has tried several medications for this.  This include Cymbalta, buspirone, Zoloft, and most recently was prescribed fluoxetine 10 mg daily.  He states he tried this for about 2 days but had mental fog and stopped.  He is anxious he obtains his Xanax from family members.  He has been trying to quit.  Currently on Suboxone.  His GAD-7 in the past was 18.  He is fidgety.  He said the hydroxyzine 10 mg 3 times daily may be helpful but does not fully remember taking this medication.  Discussed that he would benefit from talking to psychiatry because he has failed multiple therapies.  He is willing to follow-up with them.  -Stop fluoxetine. -Continue with hydroxyzine 10 mg three times a day as needed -Encouraged patient to stay away from Xanax. -Referral to psychiatry.  Opioid use disorder, mild, in sustained remission, on maintenance therapy, abuse (HCC) Patient has a history of opioid use disorder and has been on Suboxone.  He was using Suboxone 8-2 mg twice a day.  He has been using some Xanax from his family members.  However he has decreased the amount of times that he is using this.  He understands that he should not be taking his Xanax with the Suboxone.  He has not been using his Suboxone consistently.  He will use his Suboxone once or twice a day depending on how he is feeling.  Given this we will decrease his Suboxone to 1.5 films daily.  -Suboxone 1.5 8-2mg  films daily -Sent a 1 month supply and a refill. -Tox assure today. -Follow-up in 1 month  Tobacco abuse Patient is trying to stop quitting.  He would be a candidate to start varenicline.    -Consider starting varenicline at next  visit.  Bell's palsy The patient presented with flulike symptoms and tested positive for the flu in the beginning of February.  About 2 weeks later he then presented with a facial droop and inability to close his eye.  He was evaluated at the ED CT scans and MRIs were negative for stroke.  He was started on prednisone and acyclovir as well as lubricating drops for his eyes.  Today he presents with swelling of the left side of his face and inability to open his jaw.  He is very tender to palpation on that side.  He has submandibular lymphadenopathy, as well as a pack tympanic membranes bilaterally.  He does not have any dental abscesses or tonsillar exudates.  I am worried that he may be having mastoiditis.  -Maxillofacial CT to rule out mastoiditis and new onset facial nerve palsy. -Amoxicillin clavulanic acid for 10 days -Patient was instructed to call if he is not having relief from his symptoms in 2 days after starting his antibiotics.   Manuela Neptune, MD [facility] Internal Medicine, PGY-1 Phone: [phone] Date 11/11/2023 Time 5:05 PM

## 2023-11-11 NOTE — Assessment & Plan Note (Signed)
 Patient is trying to stop quitting.  He would be a candidate to start varenicline.    -Consider starting varenicline at next visit.

## 2023-11-11 NOTE — Assessment & Plan Note (Signed)
 Patient has a long history of anxiety has tried several medications for this.  This include Cymbalta, buspirone, Zoloft, and most recently was prescribed fluoxetine 10 mg daily.  He states he tried this for about 2 days but had mental fog and stopped.  He is anxious he obtains his Xanax from family members.  He has been trying to quit.  Currently on Suboxone.  His GAD-7 in the past was 18.  He is fidgety.  He said the hydroxyzine 10 mg 3 times daily may be helpful but does not fully remember taking this medication.  Discussed that he would benefit from talking to psychiatry because he has failed multiple therapies.  He is willing to follow-up with them.  -Stop fluoxetine. -Continue with hydroxyzine 10 mg three times a day as needed -Encouraged patient to stay away from Xanax. -Referral to psychiatry.

## 2023-11-11 NOTE — Telephone Encounter (Signed)
  Chief Complaint: Bells Palsy, jaw pain Symptoms: L jaw pain Frequency: began two days ago- bells since 10/28/23 Pertinent Negatives: Patient denies CP, SOB, headache, weakness Disposition: [] ED /[] Urgent Care (no appt availability in office) / [x] Appointment(In office/virtual)/ []  [place] [facility]/ [] Home Care/ [] Refused Recommended Disposition /[] [place] [facility]/ []  Follow-up with PCP Additional Notes: Patient calls reporting jaw pain related to Bells Palsy dx on 10/28/23. Patient was seen and evaluated in the ED, states he is finishing medication, but his pain is increased. Per protocol, ED or alt with PCP approval. This RN contacted [facility], spoke with Rivka Barbara, RN, warm transferred to assist patient with scheduling for today. Alerting PCP for review.   Reason for Disposition  Bell's palsy suspected (i.e., weakness on only one side of the face, developing over hours to days, no other symptoms)  Answer Assessment - Initial Assessment Questions 1. ONSET: "When did the pain start?" (e.g., minutes, hours, days)     Two days ago 2. ONSET: "Does the pain come and go, or has it been constant since it started?" (e.g., constant, intermittent, fleeting)     Constant since last night 3. SEVERITY: "How bad is the pain?"   (Scale 1-10; mild, moderate or severe)   - MILD (1-3): doesn't interfere with normal activities    - MODERATE (4-7): interferes with normal activities or awakens from sleep    - SEVERE (8-10): excruciating pain, unable to do any normal activities      7/10 4. LOCATION: "Where does it hurt?"      L side jaw 5. RASH: "Is there any redness, rash, or swelling of the face?"     Mild swelling 6. FEVER: "Do you have a fever?" If Yes, ask: "What is it, how was it measured, and when did it start?"      Denies 7. OTHER SYMPTOMS: "Do you have any other symptoms?" (e.g., fever, toothache, nasal discharge, nasal congestion, clicking sensation in jaw joint)     Feels like toothache,  has no teeth  Answer Assessment - Initial Assessment Questions 1. SYMPTOM: "What is the main symptom you are concerned about?" (e.g., weakness, numbness)     Jaw pain 2. ONSET: "When did this start?" (minutes, hours, days; while sleeping)     2 days ago 3. LAST NORMAL: "When was the last time you (the patient) were normal (no symptoms)?"     Began 2/18 4. PATTERN "Does this come and go, or has it been constant since it started?"  "Is it present now?"     Constant 5. CARDIAC SYMPTOMS: "Have you had any of the following symptoms: chest pain, difficulty breathing, palpitations?"     Denies 6. NEUROLOGIC SYMPTOMS: "Have you had any of the following symptoms: headache, dizziness, vision loss, double vision, changes in speech, unsteady on your feet?"     Jaw pain, dizziness 7. OTHER SYMPTOMS: "Do you have any other symptoms?"     Bells Palsey  Protocols used: Face Pain-A-AH, Neurologic Deficit-A-AH

## 2023-11-13 ENCOUNTER — Ambulatory Visit: Payer: Self-pay | Admitting: Student

## 2023-11-13 ENCOUNTER — Ambulatory Visit (HOSPITAL_COMMUNITY)
Admission: RE | Admit: 2023-11-13 | Discharge: 2023-11-13 | Disposition: A | Discharge status: Home / Self Care | Source: Ambulatory Visit | Attending: Internal Medicine | Admitting: Internal Medicine

## 2023-11-13 DIAGNOSIS — H6692 Otitis media, unspecified, left ear: Secondary | ICD-10-CM | POA: Insufficient documentation

## 2023-11-13 DIAGNOSIS — R6884 Jaw pain: Secondary | ICD-10-CM | POA: Diagnosis not present

## 2023-11-13 MED ORDER — IOHEXOL 300 MG/ML  SOLN
75.0000 mL | Freq: Once | INTRAMUSCULAR | Status: AC | PRN
  Administered 2023-11-13: 75 mL via INTRAVENOUS

## 2023-11-13 NOTE — Telephone Encounter (Signed)
  Chief Complaint: visit/symptom follow up call- visit 11/11/23  Symptoms: jaw pain- no change in symptoms since patient started antibiotic. He was advised to call back in 2 days to report progress. Patient does have appointment for CT scan at 3pm today- he is work-in.  Frequency: patient is taking Amoxicillin twice daily  Pertinent Negatives: Patient denies known fever at this time- but has not taken it Disposition: [] ED /[] Urgent Care (no appt availability in office) / [] Appointment(In office/virtual)/ []  [place] [facility]/ [] Home Care/ [] Refused Recommended Disposition /[] [place] [facility]/ [x]  Follow-up with PCP Additional Notes: Patient calling to report no change in symptoms- OV 11/11/23   Reason for Disposition  [1] Caller has URGENT question AND [2] triager unable to answer question  Answer Assessment - Initial Assessment Questions 1. INFECTION: "What infection is the antibiotic being given for?"     -Maxillofacial CT to rule out mastoiditis and new onset facial nerve palsy. -Amoxicillin clavulanic acid for 10 days -Patient was instructed to call if he is not having relief from his symptoms in 2 days after starting his antibiotics.  2. ANTIBIOTIC: "What antibiotic are you taking" "How many times per day?"     Amoxicillin twice daily 3. DURATION: "When was the antibiotic started?"     Started Wednesday evening 4. MAIN CONCERN OR SYMPTOM:  "What is your main concern right now?"     No change in symptoms 5. BETTER-SAME-WORSE: "Are you getting better, staying the same, or getting worse compared to when you first started the antibiotics?" If getting worse, ask: "In what way?"      Same- no change 6. FEVER: "Do you have a fever?" If Yes, ask: "What is your temperature, how was it measured, and when did it start?"     Hot/cold today- not checked does not have thermometer 7. SYMPTOMS: "Are there any other symptoms you're concerned about?" If Yes, ask: "When did it start?"     Jaw  pain- infection 8. FOLLOW-UP APPOINTMENT: "Do you have a follow-up appointment with your doctor?"     yes  Protocols used: Infection on Antibiotic Follow-up Call-A-AH

## 2023-11-13 NOTE — Telephone Encounter (Signed)
 Copied From CRM [id number]. Reason for Triage: patient is currently on amoxicillin-clavulanate (AUGMENTIN) 875-125 MG tablet , he feels as if the symptoms have not improved but has not gotten worse. (Jaw pain and eye will still not blink)   Attempted to contact patient on number provided - No answer- mailbox full- unable to leave call back messgae

## 2023-11-14 LAB — TOXASSURE SELECT,+ANTIDEPR,UR

## 2023-11-16 ENCOUNTER — Telehealth: Payer: Self-pay | Admitting: *Deleted

## 2023-11-16 NOTE — Telephone Encounter (Signed)
-----   Message from [place] sent at 11/16/2023 10:10 AM EDT ----- Teressa Lower,   It seems like this patient is not getting better with the amoxi clav Had severe pain on his jaw. CT shows foreign objects on side that he had swelling. Not sure what that is... he uses dentures. If able, can someone follow up with him please with a phone visit?   Thank you,  [place]

## 2023-11-16 NOTE — Telephone Encounter (Signed)
 I called pt who stated the pain today is not like it was; "maybe the antibiotic is working". I told pt Dr Hassan Rowan wants one of the doctors to call him to discuss his pain and CT - he's agreeable. Telehealth appt schedule Wed (first available at this time) 3/12 @ 1545PM with Dr Ninfa Meeker. Pt stated he has been having trouble with his phone; stated to call back if he doesn't answer on the first call.

## 2023-11-16 NOTE — Telephone Encounter (Signed)
 Copied from CRM [id number]. Topic: General - Other >> Nov 16, 2023  9:59 AM [name] wrote: Reason for CRM: Patient was returning a call that he missed and he would like a call back at [phone].  Pt was called - see previous note.

## 2023-11-16 NOTE — Telephone Encounter (Signed)
Called pt - no answer; mailbox is full, unable to leave a message. 

## 2023-11-17 ENCOUNTER — Encounter: Payer: Self-pay | Admitting: Student

## 2023-11-17 NOTE — Progress Notes (Signed)
 Internal Medicine Clinic Attending  Case discussed with the resident at the time of the visit.  We reviewed the resident's history and exam and pertinent patient test results.  I agree with the assessment, diagnosis, and plan of care documented in the resident's note.

## 2023-11-17 NOTE — Progress Notes (Signed)
 Called pt to notify of results from his MRI. He is improving with the antibiotics. Pain was worse on the Left than on the right. Has history of a surgery on his right side, likely why he has foreign bodies there. Has a follow up with Dr. Ninfa Meeker tomorrow which we can cancel since the antibiotics have been helping him. He will call us if his symptoms are still ongoing after his antibiotic course.

## 2023-11-17 NOTE — Progress Notes (Signed)
 Expected results on Utox except for THC. Has FU in 1 month for Suboxone

## 2023-11-18 ENCOUNTER — Telehealth: Admitting: Student

## 2023-12-02 DIAGNOSIS — G4733 Obstructive sleep apnea (adult) (pediatric): Secondary | ICD-10-CM | POA: Diagnosis not present

## 2024-01-01 ENCOUNTER — Encounter: Payer: Self-pay | Admitting: Nurse Practitioner

## 2024-01-01 ENCOUNTER — Ambulatory Visit: Payer: Managed Care, Other (non HMO) | Attending: Nurse Practitioner | Admitting: Nurse Practitioner

## 2024-01-01 VITALS — BP 142/101 | HR 80 | Ht 67.0 in | Wt 204.0 lb

## 2024-01-01 DIAGNOSIS — I2510 Atherosclerotic heart disease of native coronary artery without angina pectoris: Secondary | ICD-10-CM

## 2024-01-01 DIAGNOSIS — I700 Atherosclerosis of aorta: Secondary | ICD-10-CM

## 2024-01-01 DIAGNOSIS — I10 Essential (primary) hypertension: Secondary | ICD-10-CM | POA: Diagnosis not present

## 2024-01-01 DIAGNOSIS — R42 Dizziness and giddiness: Secondary | ICD-10-CM

## 2024-01-01 DIAGNOSIS — I6529 Occlusion and stenosis of unspecified carotid artery: Secondary | ICD-10-CM

## 2024-01-01 DIAGNOSIS — F1910 Other psychoactive substance abuse, uncomplicated: Secondary | ICD-10-CM

## 2024-01-01 DIAGNOSIS — Z8673 Personal history of transient ischemic attack (TIA), and cerebral infarction without residual deficits: Secondary | ICD-10-CM

## 2024-01-01 DIAGNOSIS — I951 Orthostatic hypotension: Secondary | ICD-10-CM | POA: Diagnosis not present

## 2024-01-01 DIAGNOSIS — E782 Mixed hyperlipidemia: Secondary | ICD-10-CM

## 2024-01-01 MED ORDER — BLOOD PRESSURE MONITOR DEVI: 1.0000 | Freq: Every day | 0 refills | Status: DC

## 2024-01-01 MED ORDER — SPIRONOLACTONE 25 MG PO TABS: 25.0000 mg | ORAL_TABLET | Freq: Every day | ORAL | 3 refills | Status: DC

## 2024-01-01 NOTE — Patient Instructions (Addendum)
 Medication Instructions:  Your physician has recommended you make the following change in your medication:  Please Reduce Aldactone  (spironolactone )  to 25 Mg daily   Labwork: In 1 month at [facility]   Testing/Procedures: None   Follow-Up: Your physician recommends that you schedule a follow-up appointment in:  1-2 week nurse visit  3 months   Any Other Special Instructions Will Be Listed Below (If Applicable).  If you need a refill on your cardiac medications before your next appointment, please call your pharmacy.

## 2024-01-01 NOTE — Progress Notes (Addendum)
 Cardiology Office Note:  .   Date:  01/01/2024 ID:  [name], DOB 01-08-1966, MRN [id number] PCP: Marni Sins, MD  [facility] Providers Cardiologist:  Teddie Favre, MD {  History of Present Illness: .   [name] is a 58 y.o. 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   Last seen by Dr. Londa Rival on November 21, 2020. Was referred for evaluation of chest discomfort. Lexiscan  Myoview  was obtained and was normal.   ED visit on February 18, 2023 for intermitent epigastric chest discomfort at [facility]. Described more CP at rest, described as burning sensation at center of his chest. CTA of chest revealed no acute PE with evidence of new small solid nodules medially in left lower lobe, hepatic steatosis. CXR showed patchy opacities in LLL. UDS positive for Marijuana and benzos. Trops negative.   Saw patient for follow-up on March 27, 2023.  He noted CP started one month ago. Described it as "sharp/indigestion," right-sided, occurred more with exertion, lasted for a few minutes, and sat down and improved, noticed with working outside. Rates 7-8/10 when it occurs. Denied any shortness of breath, palpitations, syncope, presyncope, dizziness, orthopnea, PND, swelling or significant weight changes, acute bleeding, or claudication. Stated he is not on any oxycodone  or cocaine. Echo benign. NST revealed mild decreased activity along inferior wall, findings consistent with small area of mild ischemia.  Study was determined to be low risk.  07/03/2023 - Doing well. Denies any recurrent CP since last office visit. Denies any chest pain, shortness of breath, palpitations, syncope, presyncope, dizziness, orthopnea, PND, swelling or  significant weight changes, acute bleeding, or claudication. Tolerating medications well. Cannot tolerate statins.   01/01/2024 - Presents today for follow-up.  Tells me he experienced left-sided facial droopiness with numbness in March, also could not blink out of his left eye, symptoms lasted for about 2 weeks.  Says he was diagnosed with Bell's palsy. Says symptoms have improved. Denies any recent stroke-like symptoms.  He is scheduled to see Dr. Maximo Spar in June. Says when he bends over and stands up, he gets dizzy. Tells me he gets dizzy spells often. Denies any chest pain, shortness of breath, palpitations, syncope, presyncope, orthopnea, PND, swelling or significant weight changes, acute bleeding, or claudication.  SH: Occasionally uses marijuana and smokes 1 PPD, trying to quit. Denies any alcohol use.   Studies Reviewed: Aaron Aas    EKG: EKG is not ordered today.   Carotid duplex 05/2023: Summary:  Right Carotid: Velocities in the right ICA are consistent with a 1-39%  stenosis. Non-hemodynamically significant plaque <50% noted in the  CCA. The ECA appears <50% stenosed.   Left Carotid: Velocities in the left ICA are consistent with a 1-39%  stenosis. Non-hemodynamically significant plaque <50% noted in the  CCA. The ECA appears <50% stenosed.   Vertebrals:  Bilateral vertebral arteries demonstrate antegrade flow.  Subclavians: Normal flow hemodynamics were seen in bilateral subclavian arteries.  Lexiscan  04/2023:   Lexiscan  stress without EKG changes   Myoview  scan stress images show mild decreased activity in the inferior wall (mid/distal). In the recovery images there was improvement in this region consistent with a small area of mild ischemia. When compared to scan from 2019, subtle thinning present in previous study  but in both the  rest and the stress images.   Note, motion during study may interfere with accuracy.   LVEF calculated at 70% with normal wall motion   Overall low risk  study  Echo 03/2023: 1. Left ventricular ejection fraction, by estimation, is 60 to 65%. The  left ventricle has normal function. The left ventricle has no regional  wall motion abnormalities. There is mild left ventricular hypertrophy.  Left ventricular diastolic parameters  are consistent with Grade I diastolic dysfunction (impaired relaxation).   2. Right ventricular systolic function is normal. The right ventricular  size is normal. Tricuspid regurgitation signal is inadequate for assessing  PA pressure.   3. The mitral valve is normal in structure. No evidence of mitral valve  regurgitation. No evidence of mitral stenosis.   4. The aortic valve is tricuspid. Aortic valve regurgitation is not  visualized. No aortic stenosis is present.   5. The inferior vena cava is normal in size with greater than 50%  respiratory variability, suggesting right atrial pressure of 3 mmHg.   Comparison(s): No significant change from prior study.  Lexiscan  11/2020:  No diagnostic ST segment changes to indicate ischemia. No significant myocardial perfusion defects on stress imaging to indicate scar or ischemia. There is diaphragmatic attenuation noted, mainly on the rest images. This is a low risk study. Nuclear stress EF: 63%.   Physical Exam:   VS:  BP (!) 142/101 (BP Location: Left Arm, Patient Position: Sitting, Cuff Size: Normal)   Pulse 80   Ht 5\' 7"  (1.702 m)   Wt 204 lb (92.5 kg)   SpO2 98%   BMI 31.95 kg/m    Wt Readings from Last 3 Encounters:  01/01/24 204 lb (92.5 kg)  11/11/23 210 lb 3.2 oz (95.3 kg)  10/14/23 210 lb (95.3 kg)   Orthostatic vital signs: Lying: 150/102, 76 bpm Sitting: 139/98, 81 bpm Standing: 130/86, 80 bpm Standing x 3 minutes: 159/109, 77 bpm   GEN: Obese, 57 y.o. male in no acute distress NECK: No JVD; No carotid bruits CARDIAC: S1/S2, RRR, no murmurs, rubs, gallops RESPIRATORY:  Clear to auscultation without rales, wheezing or rhonchi  ABDOMEN: Soft,  non-tender, non-distended EXTREMITIES:  No edema; No deformity   ASSESSMENT AND PLAN: .    Coronary atherosclerosis and aortic atherosclerosis Stable with no anginal symptoms. No indication for ischemic evaluation. Cannot tolerate Aspirin . Cannot tolerate statins or Zetia  - have added to his allergy list.  He has upcoming consultation with Dr. Maximo Spar in June.  Recommended to keep this appointment.  No medication changes at this time. Heart healthy diet and regular cardiovascular exercise encouraged. ED precautions discussed.  Will obtain lipoprotein a, FLP, and CMET.  2. HTN, orthostatic hypotension, orthostatic dizziness BP at goal. SBP goal < 130.  Orthostatics did reveal drop of 20 mmHg in systolic blood pressure reading upon standing - see orthostatics noted above.  No hypotension noted and blood pressure did recover.  I believe he is over-diuresed contributing to his dizziness.  Will reduce Aldactone  to 25 mg daily. Continue rest of medication regimen.  Discussed conservative measures to help improve his symptoms. Discussed to monitor BP at home at least 2 hours after medications and sitting for 5-10 minutes.  Will give him a BP cuff. Will bring him back in 1 to 2 weeks for nurse visit to repeat orthostatics and evaluate his blood pressure.  He will bring his BP log to this visit.  3. Mixed HLD Hx of multiple  statin intolerances and could not tolerate Zetia .  He has upcoming consultation with Dr. Maximo Spar.  Recommended to keep this appointment.  Will obtain lipoprotein a, FLP, and LFT prior to this appointment. Recommend discussing PCSK9i and/or Leqvio. Heart healthy diet and regular cardiovascular exercise encouraged.   4. Hx of CVA, carotid artery stenosis Denies any symptoms. Continue to follow with PCP.  Cannot tolerate low dose Aspirin , unable to tolerate statins. Starting Zetia  as mentioned above. Heart healthy diet and regular cardiovascular exercise encouraged.   5. Polysubstance  abuse Substance abuse cessation encouraged and discussed. He verbalized understanding.   Dispo: Follow-up with Dr. Londa Rival or APP in 3 months or sooner if anything changes.   Signed, Lasalle Pointer, NP

## 2024-01-02 DIAGNOSIS — G4733 Obstructive sleep apnea (adult) (pediatric): Secondary | ICD-10-CM | POA: Diagnosis not present

## 2024-01-04 ENCOUNTER — Telehealth: Payer: Self-pay | Admitting: Cardiology

## 2024-01-04 MED ORDER — BLOOD PRESSURE MONITOR DEVI: 1.0000 | Freq: Every day | 0 refills | Status: DC

## 2024-01-04 NOTE — Telephone Encounter (Signed)
 Pt states that [facility] doesn't supply BP cuffs pt was told he would need to try a different pharmacy. Please advise

## 2024-01-04 NOTE — Telephone Encounter (Signed)
 Sent patient BP cuff to [facility]

## 2024-01-13 ENCOUNTER — Other Ambulatory Visit: Payer: Self-pay | Admitting: Student

## 2024-01-13 DIAGNOSIS — F1110 Opioid abuse, uncomplicated: Secondary | ICD-10-CM

## 2024-01-13 NOTE — Telephone Encounter (Signed)
 Copied from CRM [id number]. Topic: Clinical - Medication Refill >> Jan 13, 2024  3:07 PM Shamecia H wrote: Medication: Buprenorphine  HCl-Naloxone  HCl 8-2 MG FILM  Has the patient contacted their pharmacy? Yes (Agent: If no, request that the patient contact the pharmacy for the refill. If patient does not wish to contact the pharmacy document the reason why and proceed with request.) (Agent: If yes, when and what did the pharmacy advise?)  This is the patient's preferred pharmacy:  [id number] - [place], [place] - [address] [place] [address] Phone: [phone] Fax: [phone]  Is this the correct pharmacy for this prescription? Yes If no, delete pharmacy and type the correct one.   Has the prescription been filled recently? Yes  Is the patient out of the medication? Yes  Has the patient been seen for an appointment in the last year OR does the patient have an upcoming appointment? Yes  Can we respond through MyChart? Yes  Agent: Please be advised that Rx refills may take up to 3 business days. We ask that you follow-up with your pharmacy.

## 2024-01-13 NOTE — Telephone Encounter (Signed)
 Last Fill: 11/11/23  Last OV: 11/11/23 Next OV: None Scheduled  Routing to provider for review/authorization.

## 2024-01-15 ENCOUNTER — Ambulatory Visit: Attending: Cardiology | Admitting: *Deleted

## 2024-01-15 DIAGNOSIS — I10 Essential (primary) hypertension: Secondary | ICD-10-CM | POA: Insufficient documentation

## 2024-01-15 DIAGNOSIS — I951 Orthostatic hypotension: Secondary | ICD-10-CM

## 2024-01-15 NOTE — Progress Notes (Unsigned)
 Patient in office for nurse visit this morning for vitals & orthostatics BP readings.   He took morning meds around 7:00 am.    Is planning to do his labs early next week.

## 2024-01-18 ENCOUNTER — Ambulatory Visit: Payer: Self-pay

## 2024-01-18 MED ORDER — BUPRENORPHINE HCL-NALOXONE HCL 8-2 MG SL FILM
ORAL_FILM | SUBLINGUAL | 1 refills | Status: DC
Start: 2024-01-18 — End: 2024-03-18

## 2024-01-18 NOTE — Telephone Encounter (Signed)
 Duplicate medication refill, refill request has already been routed to [facility] this morning.

## 2024-01-18 NOTE — Telephone Encounter (Signed)
 Copied from CRM [id number]. Topic: Clinical - Red Word Triage >> Jan 18, 2024 11:12 AM Julie Oddi wrote: Red Word that prompted transfer to Nurse Triage: Diarrhea +1 day   Chief Complaint: Pt. Reports he is out of suboxone  strips. Not on medication list. Asking for refill. Symptoms:  Frequency:  Pertinent Negatives: Patient denies  Disposition: [] ED /[] Urgent Care (no appt availability in office) / [] Appointment(In office/virtual)/ []  [facility]/ [] Home Care/ [] Refused Recommended Disposition /[]  Mobile Bus/ [x]  Follow-up with PCP Additional Notes: Please advise pt.   Reason for Disposition  [1] Caller has NON-URGENT medicine question about med that PCP prescribed AND [2] triager unable to answer question  Answer Assessment - Initial Assessment Questions 1. DRUG NAME: "What medicine do you need to have refilled?"     Suboxone  strips 2. REFILLS REMAINING: "How many refills are remaining?" (Note: The label on the medicine or pill bottle will show how many refills are remaining. If there are no refills remaining, then a renewal may be needed.)     0 3. EXPIRATION DATE: "What is the expiration date?" (Note: The label states when the prescription will expire, and thus can no longer be refilled.)     N/a 4. PRESCRIBING HCP: "Who prescribed it?" Reason: If prescribed by specialist, call should be referred to that group.     Pt. unsure 5. SYMPTOMS: "Do you have any symptoms?"     Is out of medication, having diarrhea 6. PREGNANCY: "Is there any chance that you are pregnant?" "When was your last menstrual period?"     N/a  Protocols used: Medication Refill and Renewal Call-A-AH

## 2024-01-31 DIAGNOSIS — G4733 Obstructive sleep apnea (adult) (pediatric): Secondary | ICD-10-CM | POA: Diagnosis not present

## 2024-02-01 DIAGNOSIS — G4733 Obstructive sleep apnea (adult) (pediatric): Secondary | ICD-10-CM | POA: Diagnosis not present

## 2024-02-13 ENCOUNTER — Other Ambulatory Visit: Payer: Self-pay | Admitting: Student

## 2024-02-13 DIAGNOSIS — J449 Chronic obstructive pulmonary disease, unspecified: Secondary | ICD-10-CM

## 2024-02-15 NOTE — Telephone Encounter (Signed)
 Medication sent to pharmacy

## 2024-02-22 ENCOUNTER — Encounter (HOSPITAL_BASED_OUTPATIENT_CLINIC_OR_DEPARTMENT_OTHER): Payer: Self-pay | Admitting: *Deleted

## 2024-02-25 ENCOUNTER — Ambulatory Visit (HOSPITAL_BASED_OUTPATIENT_CLINIC_OR_DEPARTMENT_OTHER): Payer: Medicaid Other | Admitting: Internal Medicine

## 2024-02-25 VITALS — BP 98/76 | HR 85 | Ht 67.0 in | Wt 208.0 lb

## 2024-02-25 DIAGNOSIS — Z8673 Personal history of transient ischemic attack (TIA), and cerebral infarction without residual deficits: Secondary | ICD-10-CM

## 2024-02-25 DIAGNOSIS — T466X5D Adverse effect of antihyperlipidemic and antiarteriosclerotic drugs, subsequent encounter: Secondary | ICD-10-CM

## 2024-02-25 DIAGNOSIS — M791 Myalgia, unspecified site: Secondary | ICD-10-CM

## 2024-02-25 DIAGNOSIS — E785 Hyperlipidemia, unspecified: Secondary | ICD-10-CM

## 2024-02-25 DIAGNOSIS — I2510 Atherosclerotic heart disease of native coronary artery without angina pectoris: Secondary | ICD-10-CM | POA: Diagnosis not present

## 2024-02-25 DIAGNOSIS — T466X5A Adverse effect of antihyperlipidemic and antiarteriosclerotic drugs, initial encounter: Secondary | ICD-10-CM

## 2024-02-25 NOTE — Patient Instructions (Signed)
 Medication Instructions:  START Leqvio - we will contact you once we have prior authorization from insurance *If you need a refill on your cardiac medications before your next appointment, please call your pharmacy*  Lab Work: [facility], FASTING Lipid panel, hepatic function panel as soon as able REPEAT FASTING Lipid Panel in 4-6 months ( OCT-DEC)  If you have labs (blood work) drawn today and your tests are completely normal, you will receive your results only by: MyChart Message (if you have MyChart) OR A paper copy in the mail If you have any lab test that is abnormal or we need to change your treatment, we will call you to review the results.  Follow-Up: At [facility], you and your health needs are our priority.  As part of our continuing mission to provide you with exceptional heart care, our providers are all part of one team.  This team includes your primary Cardiologist (physician) and Advanced Practice Providers or APPs (Physician Assistants and Nurse Practitioners) who all work together to provide you with the care you need, when you need it.  Your next appointment:   Pending lab results after starting medication with Dr. Maximo Spar

## 2024-02-25 NOTE — Progress Notes (Signed)
 LIPID CLINIC CONSULT NOTE  Chief Complaint:  Manage dyslipidemia  Primary Care Physician: Marni Sins, MD  Primary Cardiologist:  Teddie Favre, MD  HPI:  [name] is a 58 y.o. male who is being seen today for the evaluation of anemia at the request of Lasalle Pointer, NP.  Patient is a pleasant 58 year old male with a history coronary artery disease and high cholesterol.  He has been intolerant to statins which caused significant joint pain as well as Zetia .  He has been on no lipid-lowering therapy and his recent labs showed total cholesterol 175, triglycerides 121, HDL 40 and LDL 113.  His goal LDL is less than 70.  In addition to coronary artery disease he has a history of hypertension, polysubstance abuse including tobacco abuse and marijuana use as well as history of stroke and TIA.  He is referred today to evaluate other possible options for lipid-lowering.  PMHx:  Past Medical History:  Diagnosis Date   Acute pancreatitis    Anxiety    Arthritis    Colonic adenoma    COPD (chronic obstructive pulmonary disease) (HCC)    Depression    Diverticulitis of colon with perforation 08/2010   GERD (gastroesophageal reflux disease)    History of hiatal hernia    Hypertension    Polysubstance abuse (HCC)    Renal insufficiency    Sleep apnea    Intolerant of CPAP   Stroke (HCC)    TIA (transient ischemic attack)     Past Surgical History:  Procedure Laterality Date   APPENDECTOMY     BALLOON DILATION N/A 08/04/2022   Procedure: BALLOON DILATION;  Surgeon: Vinetta Greening, DO;  [facility];  Service: Endoscopy;  Laterality: N/A;   BIOPSY  12/14/2017   Procedure: BIOPSY;  Surgeon: Suzette Espy, MD;  [facility];  Service: Endoscopy;;  gastric    BIOPSY  10/13/2019   Procedure: BIOPSY;  Surgeon: Suzette Espy, MD;  [facility];  Service: Endoscopy;;  esophagus   BIOPSY  05/20/2021   Procedure: BIOPSY;  Surgeon:  Suzette Espy, MD;  [facility];  Service: Endoscopy;;   BIOPSY  12/18/2022   Procedure: BIOPSY;  Surgeon: Suzette Espy, MD;  [facility];  Service: Endoscopy;;   clamp left in colon after abd surgery     COLON SURGERY  05/2011   [facility]. exp laparotomy with colostomy takedown, lysis of adhesions (3hours), loop ileostomy. complicated by abscess formation requiring percutaneous drainage   colonoscopy via stoma with endoscopy of Pascual Bonds  04/2011   [facility]. two sessile polyps in trv colon. complete resection but only partial retrieval. congested mucosa in area 3cm proximal to stoma, erythematous mucosa in Hartmann pouch. repeat tcs 8/2017path-adenomatous polyp   COLONOSCOPY WITH PROPOFOL  N/A 12/14/2017   Dr. Riley Cheadle: 2 4 to 6 mm polyps in the cecum, tubular adenomas, single rectal polyp tubular adenoma, nonbleeding internal hemorrhoids.  Appears to be a surgical clip at the appendiceal orifice.  Next colonoscopy in 5 years   COLONOSCOPY WITH PROPOFOL  N/A 12/18/2022   Procedure: COLONOSCOPY WITH PROPOFOL ;  Surgeon: Suzette Espy, MD;  [facility];  Service: Endoscopy;  Laterality: N/A;  8:45 am   ESOPHAGEAL MANOMETRY N/A 12/11/2021   Procedure: ESOPHAGEAL MANOMETRY (EM);  Surgeon: Sergio Dandy, MD;  [facility];  Service: Endoscopy;  Laterality: N/A;   ESOPHAGOGASTRODUODENOSCOPY  03/05/2012   erosive reflux esophagitis, bulbar erosions,  gastric erosions/petichae, bx showed gastritis, no H.Pylori. Procedure: ESOPHAGOGASTRODUODENOSCOPY (EGD);  Surgeon: Suzette Espy, MD;  [facility];  Service: Endoscopy;  Laterality: N/A;  11:30   ESOPHAGOGASTRODUODENOSCOPY (EGD) WITH PROPOFOL  N/A 12/14/2017   Dr. Riley Cheadle: Erosive reflux esophagitis with questionable mild stricturing/non-obstruction at this level status post dilation.  multiple gastric erosions in the stomach biopsy showed reactive gastropathy but no H. pylori.    ESOPHAGOGASTRODUODENOSCOPY (EGD) WITH PROPOFOL  N/A 10/13/2019   negative Barrett's, normal stomach   ESOPHAGOGASTRODUODENOSCOPY (EGD) WITH PROPOFOL  N/A 05/20/2021   inlet patch with superimposed nodule s/p biopsy. Normal esophagus s/p dilation. Normal gastric mucosa. Normal duodenum. Reflux changes on path.   ESOPHAGOGASTRODUODENOSCOPY (EGD) WITH PROPOFOL  N/A 08/04/2022   Procedure: ESOPHAGOGASTRODUODENOSCOPY (EGD) WITH PROPOFOL ;  Surgeon: Vinetta Greening, DO;  [facility];  Service: Endoscopy;  Laterality: N/A;  1:15 pm   ESOPHAGOGASTRODUODENOSCOPY (EGD) WITH PROPOFOL  N/A 12/18/2022   Procedure: ESOPHAGOGASTRODUODENOSCOPY (EGD) WITH PROPOFOL ;  Surgeon: Suzette Espy, MD;  [facility];  Service: Endoscopy;  Laterality: N/A;   FOOT SURGERY Left    strethching of ligaments   HERNIA REPAIR     umbilical   KNEE SURGERY Right 2006   MALONEY DILATION N/A 12/14/2017   Procedure: Londa Rival DILATION;  Surgeon: Suzette Espy, MD;  [facility];  Service: Endoscopy;  Laterality: N/A;   MALONEY DILATION  05/20/2021   Procedure: Londa Rival DILATION;  Surgeon: Suzette Espy, MD;  [facility];  Service: Endoscopy;;   MALONEY DILATION N/A 12/18/2022   Procedure: Dorothyann Gather;  Surgeon: Suzette Espy, MD;  [facility];  Service: Endoscopy;  Laterality: N/A;   MULTIPLE EXTRACTIONS WITH ALVEOLOPLASTY  02/23/2012   Procedure: MULTIPLE EXTRACION WITH ALVEOLOPLASTY;  Surgeon: Cornelia Dieter, DDS;  [facility];  Service: Oral Surgery;  Laterality: Bilateral;  Removal of left mandibular torus, multiple extractions with alveoloplasty   POLYPECTOMY  12/14/2017   Procedure: POLYPECTOMY;  Surgeon: Suzette Espy, MD;  [facility];  Service: Endoscopy;;   POLYPECTOMY  12/18/2022   Procedure: POLYPECTOMY;  Surgeon: Suzette Espy, MD;  [facility];  Service: Endoscopy;;   reversal of colostomy  10/2011   per patient, did not  have report   right knee surgery     sigmoid colectomy with end colostomy, Hartmann's pouch  08/2010   [facility]. complicated diverticulitis   UMBILICAL HERNIA REPAIR      FAMHx:  Family History  Problem Relation Age of Onset   Stomach cancer Father    Lung cancer Father    Throat cancer Father    Cancer Father        lung and throat cancer   Hypertension Father    Liver disease Cousin        cirrhosis, etoh   Throat cancer Paternal Uncle    Lung cancer Paternal Uncle    Colon cancer Cousin 72   Heart disease Mother    Hypertension Mother    Colon cancer Maternal Uncle    Throat cancer Paternal Uncle    Lung cancer Paternal Uncle    Throat cancer Paternal Uncle    Lung cancer Paternal Uncle     SOCHx:   reports that he has been smoking cigarettes. He has a 37 pack-year smoking history. He has been exposed to tobacco smoke. He has quit using smokeless tobacco. He reports that he does not currently use alcohol. He reports current drug use.  Drug: Marijuana.  ALLERGIES:  Allergies  Allergen Reactions   Aspirin  Other (See Comments)    Makes me feel bad.   Codeine Itching and Nausea And Vomiting   Latex Other (See Comments)    Turns skin red.    Morphine Hives   Statins Other (See Comments)    Joint pain   Zestril [Lisinopril]     Caused Kidney failure   Zetia  [Ezetimibe ] Other (See Comments)    Joint pain   Adhesive [Tape] Rash    ROS: Pertinent items noted in HPI and remainder of comprehensive ROS otherwise negative.  HOME MEDS: Current Outpatient Medications on File Prior to Visit  Medication Sig Dispense Refill   acetaminophen  (TYLENOL ) 500 MG tablet Take 1,000 mg by mouth in the morning and at bedtime. (Patient taking differently: Take 1,000 mg by mouth every 8 (eight) hours as needed (pain).)     Buprenorphine  HCl-Naloxone  HCl 8-2 MG FILM PLACE 1.5 STRIPS UNDER THE TONGUE ONCE DAILY 45 each 1   cycloSPORINE (RESTASIS) 0.05 % ophthalmic emulsion Place 1 drop  into both eyes 2 (two) times daily.     diclofenac  Sodium (VOLTAREN ) 1 % GEL APPLY 2 GRAMS  TOPICALLY 4 TIMES DAILY (Patient taking differently: Apply 2 g topically as needed (pain).) 200 g 0   fluticasone  (FLONASE ) 50 MCG/ACT nasal spray SPRAY 2 SPRAYS INTO EACH NOSTRIL EVERY DAY 48 mL 1   hydrochlorothiazide  (HYDRODIURIL ) 25 MG tablet Take 1 tablet (25 mg total) by mouth daily. 90 tablet 3   hydrocortisone  (ANUSOL -HC) 2.5 % rectal cream Place 1 Application rectally 2 (two) times daily as needed for hemorrhoids. 28 g 2   hydrOXYzine  (ATARAX ) 10 MG tablet Take 1 tablet (10 mg total) by mouth 3 (three) times daily as needed. 30 tablet 0   isosorbide  mononitrate (IMDUR ) 30 MG 24 hr tablet Take 1 tablet (30 mg total) by mouth daily. 90 tablet 3   pantoprazole  (PROTONIX ) 40 MG tablet TAKE 1 TABLET BY MOUTH TWICE A DAY 60 tablet 5   spironolactone  (ALDACTONE ) 25 MG tablet Take 1 tablet (25 mg total) by mouth daily. 30 tablet 3   VENTOLIN  HFA 108 (90 Base) MCG/ACT inhaler TAKE 2 PUFFS BY MOUTH EVERY 6 HOURS AS NEEDED FOR WHEEZE OR SHORTNESS OF BREATH 18 each 2   Blood Pressure Monitor DEVI 1 each by Does not apply route daily. 1 each 0   Current Facility-Administered Medications on File Prior to Visit  Medication Dose Route Frequency Provider Last Rate Last Admin   triamcinolone  acetonide (KENALOG -40) injection 20 mg  20 mg Other Once Dot Gazella, DPM        LABS/IMAGING: No results found for this or any previous visit (from the past 48 hours). No results found.  LIPID PANEL:    Component Value Date/Time   CHOL 175 07/21/2023 0829   TRIG 121 07/21/2023 0829   HDL 40 07/21/2023 0829   CHOLHDL 4.4 07/21/2023 0829   CHOLHDL 5.0 04/26/2020 0457   VLDL 31 04/26/2020 0457   LDLCALC 113 (H) 07/21/2023 0829    WEIGHTS: Wt Readings from Last 3 Encounters:  02/25/24 208 lb (94.3 kg)  01/01/24 204 lb (92.5 kg)  11/11/23 210 lb 3.2 oz (95.3 kg)    VITALS: BP 98/76 (BP Location: Left Arm,  Patient Position: Sitting, Cuff Size: Normal)   Pulse 85   Ht 5' 7 (1.702 m)   Wt 208 lb (94.3 kg)   BMI 32.58 kg/m   EXAM: Deferred  EKG: Deferred  ASSESSMENT: Dyslipidemia, goal LDL less than 70 ASCVD History of stroke/TIA Polysubstance abuse (tobacco, marijuana) Hypertension Statin and ezetimibe  intolerant-myalgias  PLAN: 1.   [name] has a dyslipidemia and remains above target LDL less than 70 given history of stroke, prior TIA and cardiovascular disease.  I am concerned about compliance with every 2-week dosing and he might do better with every 17-month injection as provided with Leqvio.  Since he is on Medicaid it is possible that this would be covered and I think is a good option for lipid-lowering therapy since he cannot tolerate statins or Zetia .  There is a low risk of possible side effects with Leqvio.  I suspect this could also be administered by pharmacy in [place].  Will reach out for prior authorization for this.  If approved start injections and plan repeat lipids in 4 to 6 months.  He ultimately can follow-up with Lasalle Pointer, NP or Dr. Londa Rival.  Thanks again for the kind referral.  Hazle Lites, MD, [facility], FNLA, FACP  [place]  [facility]  Medical Director of the [facility]  [facility]ovascular [facility] Diplomate of the American Board of Clinical Lipidology Attending Cardiologist  Direct Dial: [phone]  Fax: [phone]  Website:  [email or web address] Dalessandro Baldyga 02/25/2024, 2:40 PM

## 2024-02-26 ENCOUNTER — Other Ambulatory Visit (HOSPITAL_COMMUNITY): Payer: Self-pay

## 2024-02-26 ENCOUNTER — Encounter: Payer: Self-pay | Admitting: Internal Medicine

## 2024-02-26 ENCOUNTER — Other Ambulatory Visit (HOSPITAL_BASED_OUTPATIENT_CLINIC_OR_DEPARTMENT_OTHER): Payer: Self-pay

## 2024-02-26 NOTE — Progress Notes (Signed)
 Placing Leqvio infusion orders

## 2024-02-27 DIAGNOSIS — S8991XA Unspecified injury of right lower leg, initial encounter: Secondary | ICD-10-CM | POA: Diagnosis not present

## 2024-02-27 DIAGNOSIS — M1711 Unilateral primary osteoarthritis, right knee: Secondary | ICD-10-CM | POA: Diagnosis not present

## 2024-02-27 DIAGNOSIS — S8981XA Other specified injuries of right lower leg, initial encounter: Secondary | ICD-10-CM | POA: Diagnosis not present

## 2024-02-27 DIAGNOSIS — M25561 Pain in right knee: Secondary | ICD-10-CM | POA: Diagnosis not present

## 2024-02-27 DIAGNOSIS — X501XXA Overexertion from prolonged static or awkward postures, initial encounter: Secondary | ICD-10-CM | POA: Diagnosis not present

## 2024-02-27 DIAGNOSIS — M25461 Effusion, right knee: Secondary | ICD-10-CM | POA: Diagnosis not present

## 2024-02-27 DIAGNOSIS — S8391XA Sprain of unspecified site of right knee, initial encounter: Secondary | ICD-10-CM | POA: Diagnosis not present

## 2024-02-27 DIAGNOSIS — M79661 Pain in right lower leg: Secondary | ICD-10-CM | POA: Diagnosis not present

## 2024-03-02 DIAGNOSIS — E782 Mixed hyperlipidemia: Secondary | ICD-10-CM | POA: Diagnosis not present

## 2024-03-03 DIAGNOSIS — G4733 Obstructive sleep apnea (adult) (pediatric): Secondary | ICD-10-CM | POA: Diagnosis not present

## 2024-03-03 LAB — LIPOPROTEIN A (LPA)

## 2024-03-04 ENCOUNTER — Ambulatory Visit: Payer: Self-pay | Admitting: Nurse Practitioner

## 2024-03-04 ENCOUNTER — Telehealth: Payer: Self-pay | Admitting: Pharmacy Technician

## 2024-03-04 LAB — HEPATIC FUNCTION PANEL
ALT: 14 IU/L (ref 0–44)
AST: 16 IU/L (ref 0–40)
Albumin: 4.3 g/dL (ref 3.8–4.9)
Alkaline Phosphatase: 91 IU/L (ref 44–121)
Bilirubin Total: 0.3 mg/dL (ref 0.0–1.2)
Bilirubin, Direct: 0.09 mg/dL (ref 0.00–0.40)
Total Protein: 7.9 g/dL (ref 6.0–8.5)

## 2024-03-04 LAB — LIPID PANEL
Chol/HDL Ratio: 6.5 ratio — ABNORMAL HIGH (ref 0.0–5.0)
Cholesterol, Total: 220 mg/dL — ABNORMAL HIGH (ref 100–199)
HDL: 34 mg/dL — ABNORMAL LOW (ref >39)
LDL Chol Calc (NIH): 160 mg/dL — ABNORMAL HIGH (ref 0–99)
Triglycerides: 139 mg/dL (ref 0–149)
VLDL Cholesterol Cal: 26 mg/dL (ref 5–40)

## 2024-03-04 NOTE — Telephone Encounter (Signed)
 Hi, we received from forms. I put in media. Thank you!

## 2024-03-07 ENCOUNTER — Telehealth: Payer: Self-pay

## 2024-03-07 NOTE — Telephone Encounter (Addendum)
 Dr. Mona, patient will be scheduled as soon as possible.  Auth Submission: NO AUTH NEEDED Site of care: Site of care: [facility] Payer: [facility] Medicaid Medication & CPT/J Code(s) submitted: Leqvio (Inclisiran) [id number] Diagnosis Code:  Route of submission (phone, fax, portal): phone Phone # [phone] Fax # Auth type: Buy/Bill PB Units/visits requested: 284mg  x 2 doses Reference number: automated system Approval from: 03/07/24 to 09/07/24

## 2024-03-14 ENCOUNTER — Telehealth: Payer: Self-pay | Admitting: Nurse Practitioner

## 2024-03-14 NOTE — Telephone Encounter (Signed)
 Pt came into [place] office expressed that someone called him to set up an appt for a cholesterol shot in [place] but it was suppose to be at [facility].

## 2024-03-17 DIAGNOSIS — M25569 Pain in unspecified knee: Secondary | ICD-10-CM | POA: Diagnosis not present

## 2024-03-17 DIAGNOSIS — M2391 Unspecified internal derangement of right knee: Secondary | ICD-10-CM | POA: Diagnosis not present

## 2024-03-18 ENCOUNTER — Other Ambulatory Visit: Payer: Self-pay | Admitting: Student

## 2024-03-18 ENCOUNTER — Encounter: Attending: Internal Medicine | Admitting: *Deleted

## 2024-03-18 VITALS — BP 112/78 | HR 69 | Temp 97.6°F | Resp 18

## 2024-03-18 DIAGNOSIS — I6523 Occlusion and stenosis of bilateral carotid arteries: Secondary | ICD-10-CM | POA: Diagnosis not present

## 2024-03-18 DIAGNOSIS — E785 Hyperlipidemia, unspecified: Secondary | ICD-10-CM

## 2024-03-18 DIAGNOSIS — I25110 Atherosclerotic heart disease of native coronary artery with unstable angina pectoris: Secondary | ICD-10-CM | POA: Insufficient documentation

## 2024-03-18 DIAGNOSIS — R918 Other nonspecific abnormal finding of lung field: Secondary | ICD-10-CM | POA: Diagnosis not present

## 2024-03-18 DIAGNOSIS — I700 Atherosclerosis of aorta: Secondary | ICD-10-CM | POA: Diagnosis not present

## 2024-03-18 DIAGNOSIS — F17210 Nicotine dependence, cigarettes, uncomplicated: Secondary | ICD-10-CM | POA: Insufficient documentation

## 2024-03-18 DIAGNOSIS — I10 Essential (primary) hypertension: Secondary | ICD-10-CM | POA: Insufficient documentation

## 2024-03-18 DIAGNOSIS — F1110 Opioid abuse, uncomplicated: Secondary | ICD-10-CM

## 2024-03-18 DIAGNOSIS — F32A Depression, unspecified: Secondary | ICD-10-CM | POA: Insufficient documentation

## 2024-03-18 DIAGNOSIS — E782 Mixed hyperlipidemia: Secondary | ICD-10-CM | POA: Diagnosis not present

## 2024-03-18 DIAGNOSIS — G4730 Sleep apnea, unspecified: Secondary | ICD-10-CM | POA: Diagnosis not present

## 2024-03-18 DIAGNOSIS — R079 Chest pain, unspecified: Secondary | ICD-10-CM | POA: Diagnosis present

## 2024-03-18 DIAGNOSIS — Z8673 Personal history of transient ischemic attack (TIA), and cerebral infarction without residual deficits: Secondary | ICD-10-CM | POA: Insufficient documentation

## 2024-03-18 DIAGNOSIS — J449 Chronic obstructive pulmonary disease, unspecified: Secondary | ICD-10-CM | POA: Insufficient documentation

## 2024-03-18 MED ORDER — INCLISIRAN SODIUM 284 MG/1.5ML ~~LOC~~ SOSY
284.0000 mg | PREFILLED_SYRINGE | Freq: Once | SUBCUTANEOUS | Status: AC
Start: 2024-03-18 — End: 2024-03-18
  Administered 2024-03-18: 284 mg via SUBCUTANEOUS

## 2024-03-18 NOTE — Telephone Encounter (Unsigned)
 Copied from CRM [id number]. Topic: Clinical - Medication Refill >> Mar 18, 2024  9:05 AM Cherylann RAMAN wrote: Medication: Buprenorphine  HCl-Naloxone  HCl 8-2 MG FILM  Has the patient contacted their pharmacy? No (Agent: If no, request that the patient contact the pharmacy for the refill. If patient does not wish to contact the pharmacy document the reason why and proceed with request.) (Agent: If yes, when and what did the pharmacy advise?) No refills   This is the patient's preferred pharmacy:  [id number] - [place], [place] - [address] [facility] [facility] [address] [facility] [address] [place] [address] Phone: [phone] Fax: [phone]  Is this the correct pharmacy for this prescription? Yes If no, delete pharmacy and type the correct one.   Has the prescription been filled recently? No  Is the patient out of the medication? No 3 remaining  Has the patient been seen for an appointment in the last year OR does the patient have an upcoming appointment? Yes  Can we respond through MyChart? Yes  Agent: Please be advised that Rx refills may take up to 3 business days. We ask that you follow-up with your pharmacy.

## 2024-03-18 NOTE — Progress Notes (Signed)
 Diagnosis: Hyperlipidemia  Provider:  Dinah Franco, MD  Procedure: Injection  Leqvio  (inclisiran), Dose: 284 mg, Site: subcutaneous, Number of injections: 1  Injection Site(s): Right arm  Post Care: Observation period completed  Discharge: Condition: Good, Destination: Home . AVS Provided  Performed by:  Verneda Golder, RN

## 2024-03-21 DIAGNOSIS — S82144A Nondisplaced bicondylar fracture of right tibia, initial encounter for closed fracture: Secondary | ICD-10-CM | POA: Diagnosis not present

## 2024-03-21 DIAGNOSIS — S83511A Sprain of anterior cruciate ligament of right knee, initial encounter: Secondary | ICD-10-CM | POA: Diagnosis not present

## 2024-03-21 DIAGNOSIS — S83241A Other tear of medial meniscus, current injury, right knee, initial encounter: Secondary | ICD-10-CM | POA: Diagnosis not present

## 2024-03-21 NOTE — Telephone Encounter (Signed)
 Last rx written - 01/18/24. Last OV - 11/11/23. Next OV - has not been scheduled. TOX - 11/11/23.

## 2024-03-24 MED ORDER — BUPRENORPHINE HCL-NALOXONE HCL 8-2 MG SL FILM: ORAL_FILM | SUBLINGUAL | 0 refills | Status: DC

## 2024-03-24 NOTE — Telephone Encounter (Signed)
 Last rx written - 01/18/24. Last OV - 11/11/23. TOX - 11/11/23.

## 2024-03-24 NOTE — Telephone Encounter (Signed)
 Copied from CRM [id number]. Topic: Clinical - Medication Refill >> Mar 18, 2024  9:05 AM Cherylann RAMAN wrote: Medication: Buprenorphine  HCl-Naloxone  HCl 8-2 MG FILM  Has the patient contacted their pharmacy? No (Agent: If no, request that the patient contact the pharmacy for the refill. If patient does not wish to contact the pharmacy document the reason why and proceed with request.) (Agent: If yes, when and what did the pharmacy advise?) No refills   This is the patient's preferred pharmacy:  [id number] - [place], [place] - [address] [facility] [address] [place] [address] Phone: [phone] Fax: [phone]  Is this the correct pharmacy for this prescription? Yes If no, delete pharmacy and type the correct one.   Has the prescription been filled recently? No  Is the patient out of the medication? No 3 remaining  Has the patient been seen for an appointment in the last year OR does the patient have an upcoming appointment? Yes  Can we respond through MyChart? Yes  Agent: Please be advised that Rx refills may take up to 3 business days. We ask that you follow-up with your pharmacy. >> Mar 24, 2024  9:56 AM Zane F wrote: Patient is now completely out of the prescription mentioned. Patient has waited the 3 business days and he nor his pharmacy have received a response regarding the prescription refill. Patient did mention that once he is off of the medication he begins to get sick to his stomach. Please follow up with patient with an update on refill.   Callback Number: [id number]

## 2024-03-28 ENCOUNTER — Other Ambulatory Visit: Payer: Self-pay | Admitting: Nurse Practitioner

## 2024-03-29 ENCOUNTER — Other Ambulatory Visit: Payer: Self-pay

## 2024-03-29 DIAGNOSIS — I10 Essential (primary) hypertension: Secondary | ICD-10-CM

## 2024-03-30 MED ORDER — HYDROCHLOROTHIAZIDE 25 MG PO TABS: 25.0000 mg | ORAL_TABLET | Freq: Every day | ORAL | 3 refills | Status: DC

## 2024-04-01 ENCOUNTER — Encounter: Payer: Self-pay | Admitting: Nurse Practitioner

## 2024-04-01 ENCOUNTER — Other Ambulatory Visit: Payer: Self-pay

## 2024-04-01 ENCOUNTER — Emergency Department (HOSPITAL_COMMUNITY)
Admission: EM | Admit: 2024-04-01 | Discharge: 2024-04-01 | Disposition: A | Discharge status: Home / Self Care | Attending: Emergency Medicine | Admitting: Emergency Medicine

## 2024-04-01 ENCOUNTER — Emergency Department (HOSPITAL_COMMUNITY)

## 2024-04-01 ENCOUNTER — Encounter: Admitting: Nurse Practitioner

## 2024-04-01 ENCOUNTER — Encounter (HOSPITAL_COMMUNITY): Payer: Self-pay | Admitting: Emergency Medicine

## 2024-04-01 VITALS — BP 122/80 | HR 87 | Ht 67.0 in | Wt 208.2 lb

## 2024-04-01 DIAGNOSIS — I4510 Unspecified right bundle-branch block: Secondary | ICD-10-CM | POA: Diagnosis not present

## 2024-04-01 DIAGNOSIS — I200 Unstable angina: Secondary | ICD-10-CM | POA: Diagnosis not present

## 2024-04-01 DIAGNOSIS — R0789 Other chest pain: Secondary | ICD-10-CM | POA: Insufficient documentation

## 2024-04-01 DIAGNOSIS — F17200 Nicotine dependence, unspecified, uncomplicated: Secondary | ICD-10-CM | POA: Diagnosis not present

## 2024-04-01 DIAGNOSIS — R079 Chest pain, unspecified: Secondary | ICD-10-CM | POA: Diagnosis not present

## 2024-04-01 DIAGNOSIS — I25110 Atherosclerotic heart disease of native coronary artery with unstable angina pectoris: Secondary | ICD-10-CM | POA: Diagnosis not present

## 2024-04-01 DIAGNOSIS — Z91040 Latex allergy status: Secondary | ICD-10-CM | POA: Diagnosis not present

## 2024-04-01 DIAGNOSIS — I10 Essential (primary) hypertension: Secondary | ICD-10-CM | POA: Insufficient documentation

## 2024-04-01 DIAGNOSIS — R231 Pallor: Secondary | ICD-10-CM | POA: Diagnosis not present

## 2024-04-01 DIAGNOSIS — I213 ST elevation (STEMI) myocardial infarction of unspecified site: Secondary | ICD-10-CM | POA: Diagnosis not present

## 2024-04-01 LAB — BASIC METABOLIC PANEL WITH GFR
Anion gap: 10 (ref 5–15)
BUN: 22 mg/dL — ABNORMAL HIGH (ref 6–20)
CO2: 28 mmol/L (ref 22–32)
Calcium: 9.4 mg/dL (ref 8.9–10.3)
Chloride: 99 mmol/L (ref 98–111)
Creatinine, Ser: 1.47 mg/dL — ABNORMAL HIGH (ref 0.61–1.24)
GFR, Estimated: 55 mL/min — ABNORMAL LOW (ref >60)
Glucose, Bld: 81 mg/dL (ref 70–99)
Potassium: 4.2 mmol/L (ref 3.5–5.1)
Sodium: 137 mmol/L (ref 135–145)

## 2024-04-01 LAB — CBC
HCT: 46.4 % (ref 39.0–52.0)
Hemoglobin: 15.1 g/dL (ref 13.0–17.0)
MCH: 30.6 pg (ref 26.0–34.0)
MCHC: 32.5 g/dL (ref 30.0–36.0)
MCV: 93.9 fL (ref 80.0–100.0)
Platelets: 210 K/uL (ref 150–400)
RBC: 4.94 MIL/uL (ref 4.22–5.81)
RDW: 13.2 % (ref 11.5–15.5)
WBC: 9.2 K/uL (ref 4.0–10.5)
nRBC: 0 % (ref 0.0–0.2)

## 2024-04-01 LAB — TROPONIN I (HIGH SENSITIVITY)
Troponin I (High Sensitivity): 4 ng/L (ref <18)
Troponin I (High Sensitivity): 4 ng/L (ref <18)

## 2024-04-01 MED ORDER — KETOROLAC TROMETHAMINE 15 MG/ML IJ SOLN
15.0000 mg | Freq: Once | INTRAMUSCULAR | Status: AC
  Administered 2024-04-01: 15 mg via INTRAVENOUS
  Filled 2024-04-01: qty 1

## 2024-04-01 MED ORDER — ALUM & MAG HYDROXIDE-SIMETH 200-200-20 MG/5ML PO SUSP
30.0000 mL | Freq: Once | ORAL | Status: AC
  Administered 2024-04-01: 30 mL via ORAL
  Filled 2024-04-01: qty 30

## 2024-04-01 NOTE — ED Notes (Signed)
 [facility] called

## 2024-04-01 NOTE — ED Provider Notes (Signed)
  Physical Exam  BP 106/85   Pulse 65   Temp 98 F (36.7 C) (Oral)   Resp 10   Ht 5' 7 (1.702 m)   Wt 94.4 kg   SpO2 94%   BMI 32.60 kg/m   Physical Exam  Procedures  Procedures  ED Course / MDM    Medical Decision Making Care assumed at 4 PM.  Patient is here with chest pain.  Patient has been followed with cardiology.  Signed out pending second troponin and if negative patient can be discharged  5:19 PM Troponin negative.  Patient is comfortably sleeping.  Encouraged him to follow-up with cardiology outpatient  Problems Addressed: Nonspecific chest pain: acute illness or injury  Amount and/or Complexity of Data Reviewed Labs: ordered. Decision-making details documented in ED Course. Radiology: ordered and independent interpretation performed. Decision-making details documented in ED Course. ECG/medicine tests: ordered and independent interpretation performed. Decision-making details documented in ED Course.  Risk OTC drugs. Prescription drug management.          Patt Alm Macho, MD 04/01/24 [phone]

## 2024-04-01 NOTE — Discharge Instructions (Signed)
 As we discussed, your heart enzyme test is normal today  Please call cardiology for follow-up he have persistent pain  Return to ER if you have worse chest pain or shortness of breath

## 2024-04-01 NOTE — ED Triage Notes (Signed)
 Pt was BIB [facility]. He came from an appt for heart care. While driving to his appt he had 10/10 sternal chest pain per EMS. [facility] did ECG and they suggested pt was having a STEMI. Sent over for provider to review and wasn't determined to be a stemi so  [facility].   nitroglycerin given en route Pt complaining of dizziness. PMX of vertigo, and stenosis 139/79 80 HR 97% RA

## 2024-04-01 NOTE — Progress Notes (Addendum)
 Cardiology Office Note:  .   Date:  04/01/2024 ID:  [name], DOB Jan 16, 1966, MRN [id number] PCP: Celestina Czar, MD  [facility] Providers Cardiologist:  Jayson Sierras, MD {  History of Present Illness: .   [name] is a 58 y.o. 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   Last seen by Dr. Sierras on November 21, 2020. Was referred for evaluation of chest discomfort. Lexiscan  Myoview  was obtained and was normal.   ED visit on February 18, 2023 for intermitent epigastric chest discomfort at [facility]. Described more CP at rest, described as burning sensation at center of his chest. CTA of chest revealed no acute PE with evidence of new small solid nodules medially in left lower lobe, hepatic steatosis. CXR showed patchy opacities in LLL. UDS positive for Marijuana and benzos. Trops negative.   Saw patient for follow-up on March 27, 2023.  He noted CP started one month ago. Described it as sharp/indigestion, right-sided, occurred more with exertion, lasted for a few minutes, and sat down and improved, noticed with working outside. Rates 7-8/10 when it occurs. Denied any shortness of breath, palpitations, syncope, presyncope, dizziness, orthopnea, PND, swelling or significant weight changes, acute bleeding, or claudication. Stated he is not on any oxycodone  or cocaine. Echo benign. NST revealed mild decreased activity along inferior wall, findings consistent with small area of mild ischemia.  Study was determined to be low risk.  07/03/2023 - Doing well. Denies any recurrent CP since last office visit. Denies any chest pain, shortness of breath, palpitations, syncope, presyncope, dizziness, orthopnea, PND, swelling or  significant weight changes, acute bleeding, or claudication. Tolerating medications well. Cannot tolerate statins.   01/01/2024 - Presents today for follow-up.  Tells me he experienced left-sided facial droopiness with numbness in March, also could not blink out of his left eye, symptoms lasted for about 2 weeks.  Says he was diagnosed with Bell's palsy. Says symptoms have improved. Denies any recent stroke-like symptoms.  He is scheduled to see Dr. Mona in June. Says when he bends over and stands up, he gets dizzy. Tells me he gets dizzy spells often. Denies any chest pain, shortness of breath, palpitations, syncope, presyncope, orthopnea, PND, swelling or significant weight changes, acute bleeding, or claudication.  04/01/2024  - patient comes in today for 50-month follow-up appointment.  Initial EKG gave preliminary report of acute STEMI with ST segment elevation in leads II, III and aVF.  Repeat EKG showed similar preliminary report with acute STEMI.  Upon entering patient room today, he does not appear well.  He is clammy, clutching his chest and describes chest pain that began at 1030 this morning.  He tells me he has had intermittent sharp chest pain for a while, but says he has never had chest pain this severe before.  He describes it as sharp, at the center of his chest, 10 out of 10 in intensity.  Tells me he feels short of breath with this.  Vital signs are all stable. Inpatient Cardiology was consulted along with DOD, Dr. Wilbert Bihari who is at [facility] today. Dr. Gordy Bergamo looked at EKG images and stated no STEMI, see below.   SH: Occasionally uses marijuana and smokes 1 PPD, trying to quit. Denies any  alcohol use.   Studies Reviewed: SABRA    EKG:  EKG Interpretation Date/Time:  Friday April 01 2024 11:12:19 EDT Ventricular Rate:  85 PR Interval:  192 QRS Duration:  80 QT Interval:  344 QTC Calculation: 409 R Axis:   32  Text Interpretation: Normal sinus rhythm ST elevation consider inferior  injury or acute infarct ** ** ACUTE MI / STEMI ** ** When compared with ECG of 14-Oct-2023 15:24, QRS axis Shifted right Criteria for Inferior infarct are no longer Present ST elevation now present in Inferior leads Confirmed by [name] [phone]) on 04/01/2024 11:17:20 AM   Carotid duplex 05/2023: Summary:  Right Carotid: Velocities in the right ICA are consistent with a 1-39%  stenosis. Non-hemodynamically significant plaque <50% noted in the  CCA. The ECA appears <50% stenosed.   Left Carotid: Velocities in the left ICA are consistent with a 1-39%  stenosis. Non-hemodynamically significant plaque <50% noted in the  CCA. The ECA appears <50% stenosed.   Vertebrals:  Bilateral vertebral arteries demonstrate antegrade flow.  Subclavians: Normal flow hemodynamics were seen in bilateral subclavian arteries.  Lexiscan  04/2023:   Lexiscan  stress without EKG changes   Myoview  scan stress images show mild decreased activity in the inferior wall (mid/distal). In the recovery images there was improvement in this region consistent with a small area of mild ischemia. When compared to scan from 2019, subtle thinning present in previous study  but in both the  rest and the stress images.   Note, motion during study may interfere with accuracy.   LVEF calculated at 70% with normal wall motion   Overall low risk study  Echo 03/2023: 1. Left ventricular ejection fraction, by estimation, is 60 to 65%. The  left ventricle has normal function. The left ventricle has no regional  wall motion abnormalities. There is mild left ventricular hypertrophy.  Left ventricular diastolic parameters  are consistent with Grade I diastolic dysfunction (impaired relaxation).   2. Right ventricular systolic function is normal. The right ventricular  size is normal. Tricuspid regurgitation signal is inadequate for assessing  PA pressure.   3. The mitral valve is normal in structure. No evidence of mitral valve   regurgitation. No evidence of mitral stenosis.   4. The aortic valve is tricuspid. Aortic valve regurgitation is not  visualized. No aortic stenosis is present.   5. The inferior vena cava is normal in size with greater than 50%  respiratory variability, suggesting right atrial pressure of 3 mmHg.   Comparison(s): No significant change from prior study.  Lexiscan  11/2020:  No diagnostic ST segment changes to indicate ischemia. No significant myocardial perfusion defects on stress imaging to indicate scar or ischemia. There is diaphragmatic attenuation noted, mainly on the rest images. This is a low risk study. Nuclear stress EF: 63%.   Physical Exam:   VS:  BP 122/80   Pulse 87   Ht 5' 7 (1.702 m)   Wt 208 lb 3.2 oz (94.4 kg)   SpO2 94%   BMI 32.61 kg/m    Wt Readings from Last 3 Encounters:  04/01/24 208 lb 3.2 oz (94.4 kg)  02/25/24 208 lb (94.3 kg)  01/01/24 204 lb (92.5 kg)     GEN: Obese, 58 y.o. male in acute distress, ill-appearing, clammy, fatigued, in pain.  NECK: No JVD; No carotid bruits CARDIAC: S1/S2, RRR, no murmurs, rubs, gallops RESPIRATORY:  Clear to auscultation without rales, wheezing or rhonchi  ABDOMEN: Soft, non-tender, non-distended EXTREMITIES:  No edema;  No deformity   ASSESSMENT AND PLAN: .    Unstable Angina Patient comes in today with acute onset of symptoms of chest pain that is severe.  2 initial EKGs with preliminary reports of ST segment elevations in leads II, III, and aVF.  Patient is symptomatic with this presentation.  He describes chest pain at rest, initially started at 1030 this morning.  He says he had been doing his usual outdoor activity for an hour this morning but symptoms did not start until 1030.  Says he has never had chest pain as this severe before.  I consulted DOD today as well as inpatient cardiology at [facility] for concern of unstable angina.  EMS was initiated and called per protocol and repeat EKG looked good,  did not show ST segment elevations.  Inpatient cardiology stated that attending cardiologist (Dr. Ladona) looked at images and did not see ST segment elevations.  Due to his ill appearance, I recommended he be transferred to [facility] for what sounds like patient needs to have a heart cath due to his symptoms of unstable angina, may be having a possible NSTEMI.  EMS did state that they would take him to [facility] ED.  I have been in contact with inpatient cardiology, Manuelita Rummer, NP who is working at [facility] as well as Dr. Wonda who is receiving MD.  Patient did receive 1 tablet of nitroglycerin here in the office at 11:30 AM that did help relieve his chest pain from a 10/10 to a 7 out of 10.  Patient has documented allergy to aspirin  and therefore this cannot be administered.  This NP stayed with patient throughout the entire time due to his unstable status.  Report was given to EMS who verbalized understanding and transported him immediately to [facility].   Dispo: Follow-up with Dr. Debera or APP in 1-2 weeks post hospital d/c or sooner if anything changes.   Signed, Almarie Crate, NP

## 2024-04-01 NOTE — Patient Instructions (Addendum)
 Medication Instructions:  Your physician recommends that you continue on your current medications as directed. Please refer to the Current Medication list given to you today.  Labwork: None   Testing/Procedures: None   Follow-Up: Your physician recommends that you schedule a follow-up appointment in: 1-2 weeks after discharge   Any Other Special Instructions Will Be Listed Below (If Applicable).  If you need a refill on your cardiac medications before your next appointment, please call your pharmacy.

## 2024-04-01 NOTE — ED Provider Notes (Signed)
 [place] EMERGENCY DEPARTMENT AT [facility] Provider Note   CSN: [id number] Arrival date & time: 04/01/24  1234     Patient presents with: Chest Pain   [name] is a 58 y.o. male.   58 yo M with a chief complaint of chest pain.  This is sharp to the center of the chest seems to come and go.  Last for maybe a second at a time when it occurs.  He thinks maybe it is indigestion.  Denies cough congestion or fever.  Denies trauma.  Has had it for weeks but thought it was may be worse today.  Occurred just prior to arrival.  Patient denies history of MI, Hx of HTN, HLD, smoking.  Denies family history of MI.  Patient denies history of PE or DVT denies hemoptysis denies unilateral lower extremity edema denies recent surgery immobilization hospitalization estrogen use or history of cancer.     Chest Pain      Prior to Admission medications   Medication Sig Start Date End Date Taking? Authorizing Provider  acetaminophen  (TYLENOL ) 500 MG tablet Take 1,000 mg by mouth in the morning and at bedtime.    [provider]  Blood Pressure Monitor DEVI 1 each by Does not apply route daily. 01/04/24   Miriam Norris, NP  Buprenorphine  HCl-Naloxone  HCl 8-2 MG FILM PLACE 1.5 STRIPS UNDER THE TONGUE ONCE DAILY 03/24/24   Narendra, Nischal, MD  cycloSPORINE (RESTASIS) 0.05 % ophthalmic emulsion Place 1 drop into both eyes 2 (two) times daily. 06/18/23   [provider]  diclofenac  Sodium (VOLTAREN ) 1 % GEL APPLY 2 GRAMS  TOPICALLY 4 TIMES DAILY 01/14/22   Rehman, Areeg N, DO  fluticasone  (FLONASE ) 50 MCG/ACT nasal spray SPRAY 2 SPRAYS INTO EACH NOSTRIL EVERY DAY 10/01/23   Koomson, Julius, MD  hydrochlorothiazide  (HYDRODIURIL ) 25 MG tablet Take 1 tablet (25 mg total) by mouth daily. 03/30/24   Koomson, Julius, MD  hydrocortisone  (ANUSOL -HC) 2.5 % rectal cream Place 1 Application rectally 2 (two) times daily as needed for hemorrhoids. 08/12/23   Rudy Josette RAMAN, PA-C   hydrOXYzine  (ATARAX ) 10 MG tablet Take 1 tablet (10 mg total) by mouth 3 (three) times daily as needed. 10/14/23   Tobie Gaines, DO  isosorbide  mononitrate (IMDUR ) 30 MG 24 hr tablet Take 1 tablet (30 mg total) by mouth daily. 10/14/23 04/01/25  Tobie Gaines, DO  pantoprazole  (PROTONIX ) 40 MG tablet TAKE 1 TABLET BY MOUTH TWICE A DAY 06/26/23   Rudy Josette RAMAN, PA-C  spironolactone  (ALDACTONE ) 25 MG tablet Take 1 tablet (25 mg total) by mouth daily. 01/01/24   Miriam Norris, NP  VENTOLIN  HFA 108 (90 Base) MCG/ACT inhaler TAKE 2 PUFFS BY MOUTH EVERY 6 HOURS AS NEEDED FOR WHEEZE OR SHORTNESS OF BREATH 02/15/24   Celestina Czar, MD    Allergies: Aspirin , Codeine, Latex, Morphine, Statins, Zestril [lisinopril], Zetia  [ezetimibe ], and Adhesive [tape]    Review of Systems  Cardiovascular:  Positive for chest pain.    Updated Vital Signs BP 112/80   Pulse 65   Temp 98 F (36.7 C) (Oral)   Resp (!) 9   Ht 5' 7 (1.702 m)   Wt 94.4 kg   SpO2 95%   BMI 32.60 kg/m   Physical Exam Vitals and nursing note reviewed.  Constitutional:      Appearance: He is well-developed.  HENT:     Head: Normocephalic and atraumatic.  Eyes:     Pupils: Pupils are equal, round, and  reactive to light.  Neck:     Vascular: No JVD.  Cardiovascular:     Rate and Rhythm: Normal rate and regular rhythm.     Heart sounds: No murmur heard.    No friction rub. No gallop.  Pulmonary:     Effort: No respiratory distress.     Breath sounds: No wheezing.  Chest:     Chest wall: Tenderness present.     Comments: Pain on palpation of the anterior chest reproduces his discomfort. Abdominal:     General: There is no distension.     Tenderness: There is no abdominal tenderness. There is no guarding or rebound.  Musculoskeletal:        General: Normal range of motion.     Cervical back: Normal range of motion and neck supple.  Skin:    Coloration: Skin is not pale.     Findings: No rash.  Neurological:     Mental  Status: He is alert and oriented to person, place, and time.  Psychiatric:        Behavior: Behavior normal.     (all labs ordered are listed, but only abnormal results are displayed) Labs Reviewed  BASIC METABOLIC PANEL WITH GFR - Abnormal; Notable for the following components:      Result Value   BUN 22 (*)    Creatinine, Ser 1.47 (*)    GFR, Estimated 55 (*)    All other components within normal limits  CBC  TROPONIN I (HIGH SENSITIVITY)  TROPONIN I (HIGH SENSITIVITY)    EKG: EKG Interpretation Date/Time:  Friday April 01 2024 12:38:05 EDT Ventricular Rate:  78 PR Interval:  186 QRS Duration:  90 QT Interval:  376 QTC Calculation: 429 R Axis:   33  Text Interpretation: Sinus rhythm Probable left atrial enlargement RSR' in V1 or V2, right VCD or RVH ST elevation, consider inferior injury No significant change since last tracing Confirmed by Emil Share [phone]) on 04/01/2024 4:12:47 PM  Radiology: [facility] Chest 2 View Result Date: 04/01/2024 CLINICAL DATA:  Chest pain. EXAM: CHEST - 2 VIEW COMPARISON:  Chest radiograph dated 10/14/2027. FINDINGS: Mild cardiomegaly with mild vascular congestion. No focal consolidation, pleural effusion or pneumothorax. No acute osseous pathology. IMPRESSION: Mild cardiomegaly with mild vascular congestion. Electronically Signed   By: Vanetta Chou M.D.   On: 04/01/2024 13:20     Procedures   Medications Ordered in the ED  alum & mag hydroxide-simeth (MAALOX/MYLANTA) 200-200-20 MG/5ML suspension 30 mL (30 mLs Oral Given 04/01/24 1355)  ketorolac  (TORADOL ) 15 MG/ML injection 15 mg (15 mg Intravenous Given 04/01/24 1416)                                    Medical Decision Making Amount and/or Complexity of Data Reviewed Labs: ordered. Radiology: ordered.  Risk OTC drugs. Prescription drug management.   58 yo M with a chief complaint of chest pain.  Atypical in nature in her distal exam.  Going on for a while but had a worsening event  just prior to arrival.  Will obtain 2 troponins chest x-ray blood work reassess.  Initial troponin is negative.  No significant electrolyte abnormalities.  No anemia.  Chest x-ray independently interpreted by me without focal effusion pneumothorax.  Awaiting delta.  Patient care was signed out to Dr. Patt, please see their note for further details of care in the ED.  The patients results  and plan were reviewed and discussed.   Any x-rays performed were independently reviewed by myself.   Differential diagnosis were considered with the presenting HPI.  Medications  alum & mag hydroxide-simeth (MAALOX/MYLANTA) 200-200-20 MG/5ML suspension 30 mL (30 mLs Oral Given 04/01/24 1355)  ketorolac  (TORADOL ) 15 MG/ML injection 15 mg (15 mg Intravenous Given 04/01/24 1416)    Vitals:   04/01/24 1239 04/01/24 1258 04/01/24 1545  BP: (!) 134/101  112/80  Pulse: 79  65  Resp: 12  (!) 9  Temp: 98 F (36.7 C)    TempSrc: Oral    SpO2: 95%  95%  Weight:  94.4 kg   Height:  5' 7 (1.702 m)     Final diagnoses:  Nonspecific chest pain        Final diagnoses:  Nonspecific chest pain    ED Discharge Orders     None          Emil Share, DO 04/01/24 1622

## 2024-04-01 NOTE — Progress Notes (Addendum)
 Called and left VM to [employer] about situation with callback number to call me if she has any questions.  Called and spoke with patient's aunt Cy (okay per [facility]) and she is aware of situation as patient called her while on the ambulance. She will be going to visit patient soon at [facility]. She verbalized understanding and was appreciative of my call.  Almarie Crate, NP

## 2024-04-02 DIAGNOSIS — G4733 Obstructive sleep apnea (adult) (pediatric): Secondary | ICD-10-CM | POA: Diagnosis not present

## 2024-04-04 DIAGNOSIS — M25659 Stiffness of unspecified hip, not elsewhere classified: Secondary | ICD-10-CM | POA: Diagnosis not present

## 2024-04-04 DIAGNOSIS — M25569 Pain in unspecified knee: Secondary | ICD-10-CM | POA: Diagnosis not present

## 2024-04-04 DIAGNOSIS — S83241A Other tear of medial meniscus, current injury, right knee, initial encounter: Secondary | ICD-10-CM | POA: Diagnosis not present

## 2024-04-04 DIAGNOSIS — M2391 Unspecified internal derangement of right knee: Secondary | ICD-10-CM | POA: Diagnosis not present

## 2024-04-04 DIAGNOSIS — S83511A Sprain of anterior cruciate ligament of right knee, initial encounter: Secondary | ICD-10-CM | POA: Diagnosis not present

## 2024-04-10 ENCOUNTER — Other Ambulatory Visit: Payer: Self-pay | Admitting: Nurse Practitioner

## 2024-04-10 ENCOUNTER — Other Ambulatory Visit: Payer: Self-pay | Admitting: Gastroenterology

## 2024-04-10 DIAGNOSIS — K219 Gastro-esophageal reflux disease without esophagitis: Secondary | ICD-10-CM

## 2024-04-18 ENCOUNTER — Other Ambulatory Visit: Payer: Self-pay | Admitting: Student

## 2024-04-18 DIAGNOSIS — F1110 Opioid abuse, uncomplicated: Secondary | ICD-10-CM

## 2024-04-18 NOTE — Telephone Encounter (Signed)
 Last rx written - 03/24/24 Last OV - 11/11/23. Next OV - has not been scheduled. TOX - 11/11/23.

## 2024-04-18 NOTE — Telephone Encounter (Signed)
 Copied from CRM [id number]. Topic: Clinical - Medication Refill >> Apr 18, 2024 11:57 AM Latavia C wrote: Medication: Buprenorphine  HCl-Naloxone  HCl 8-2 MG FILM  Has the patient contacted their pharmacy? No   This is the patient's preferred pharmacy:  [id number] - [place], [place] - [address] [place] [address] Phone: [phone] Fax: [phone]  Is this the correct pharmacy for this prescription? Yes If no, delete pharmacy and type the correct one.   Has the prescription been filled recently? Yes  Is the patient out of the medication? No  Has the patient been seen for an appointment in the last year OR does the patient have an upcoming appointment? Yes  Can we respond through MyChart? Yes  Agent: Please be advised that Rx refills may take up to 3 business days. We ask that you follow-up with your pharmacy.

## 2024-04-19 ENCOUNTER — Other Ambulatory Visit: Payer: Self-pay | Admitting: Internal Medicine

## 2024-04-19 DIAGNOSIS — F1110 Opioid abuse, uncomplicated: Secondary | ICD-10-CM

## 2024-04-21 ENCOUNTER — Ambulatory Visit: Attending: Nurse Practitioner | Admitting: Nurse Practitioner

## 2024-04-21 ENCOUNTER — Encounter: Payer: Self-pay | Admitting: Nurse Practitioner

## 2024-04-21 VITALS — BP 138/98 | HR 77 | Ht 67.0 in | Wt 207.4 lb

## 2024-04-21 DIAGNOSIS — I10 Essential (primary) hypertension: Secondary | ICD-10-CM | POA: Diagnosis not present

## 2024-04-21 DIAGNOSIS — R079 Chest pain, unspecified: Secondary | ICD-10-CM | POA: Insufficient documentation

## 2024-04-21 DIAGNOSIS — R7989 Other specified abnormal findings of blood chemistry: Secondary | ICD-10-CM | POA: Insufficient documentation

## 2024-04-21 DIAGNOSIS — Z8673 Personal history of transient ischemic attack (TIA), and cerebral infarction without residual deficits: Secondary | ICD-10-CM | POA: Diagnosis present

## 2024-04-21 DIAGNOSIS — F1910 Other psychoactive substance abuse, uncomplicated: Secondary | ICD-10-CM | POA: Diagnosis present

## 2024-04-21 DIAGNOSIS — I700 Atherosclerosis of aorta: Secondary | ICD-10-CM | POA: Insufficient documentation

## 2024-04-21 DIAGNOSIS — R42 Dizziness and giddiness: Secondary | ICD-10-CM | POA: Insufficient documentation

## 2024-04-21 DIAGNOSIS — I951 Orthostatic hypotension: Secondary | ICD-10-CM | POA: Diagnosis present

## 2024-04-21 DIAGNOSIS — I2510 Atherosclerotic heart disease of native coronary artery without angina pectoris: Secondary | ICD-10-CM | POA: Diagnosis not present

## 2024-04-21 DIAGNOSIS — Z79899 Other long term (current) drug therapy: Secondary | ICD-10-CM

## 2024-04-21 DIAGNOSIS — I6529 Occlusion and stenosis of unspecified carotid artery: Secondary | ICD-10-CM | POA: Insufficient documentation

## 2024-04-21 DIAGNOSIS — E782 Mixed hyperlipidemia: Secondary | ICD-10-CM | POA: Diagnosis present

## 2024-04-21 MED ORDER — ISOSORBIDE MONONITRATE ER 30 MG PO TB24: 30.0000 mg | ORAL_TABLET | Freq: Two times a day (BID) | ORAL | 1 refills | Status: DC

## 2024-04-21 NOTE — Telephone Encounter (Signed)
 Pt was called and informed to schedule an appt prior to his next Suboxone  refill. Pt's agreeable - call transferred to the front office.

## 2024-04-21 NOTE — Patient Instructions (Addendum)
 Medication Instructions:  Your physician has recommended you make the following change in your medication:  Please Increase IMDUR  to 30 Mg Twice daily   Labwork: In 2-3 weeks at [facility]   Testing/Procedures: None   Follow-Up: Your physician recommends that you schedule a follow-up appointment in:  1-2 week nurse visit for a blood pressure check with monitor calibration  4-6 weeks   Any Other Special Instructions Will Be Listed Below (If Applicable).  If you need a refill on your cardiac medications before your next appointment, please call your pharmacy.

## 2024-04-21 NOTE — Progress Notes (Unsigned)
 Cardiology Office Note:  .   Date:  04/21/2024 ID:  EHREN BERISHA, DOB 19-Nov-1965, MRN [id number] PCP: Celestina Czar, MD  [facility] Providers Cardiologist:  Jayson Sierras, MD {  History of Present Illness: .   [name] is a 58 y.o. 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   Last seen by Dr. Sierras on November 21, 2020. Was referred for evaluation of chest discomfort. Lexiscan  Myoview  was obtained and was normal.   ED visit on February 18, 2023 for intermitent epigastric chest discomfort at [facility]. Described more CP at rest, described as burning sensation at center of his chest. CTA of chest revealed no acute PE with evidence of new small solid nodules medially in left lower lobe, hepatic steatosis. CXR showed patchy opacities in LLL. UDS positive for Marijuana and benzos. Trops negative.   Saw patient for follow-up on March 27, 2023.  He noted CP started one month ago. Described it as sharp/indigestion, right-sided, occurred more with exertion, lasted for a few minutes, and sat down and improved, noticed with working outside. Rates 7-8/10 when it occurs. Denied any shortness of breath, palpitations, syncope, presyncope, dizziness, orthopnea, PND, swelling or significant weight changes, acute bleeding, or claudication. Stated he is not on any oxycodone  or cocaine. Echo benign. NST revealed mild decreased activity along inferior wall, findings consistent with small area of mild ischemia.  Study was determined to be low risk.  07/03/2023 - Doing well. Denies any recurrent CP since last office visit. Denies any chest pain, shortness of breath, palpitations, syncope, presyncope, dizziness, orthopnea, PND, swelling or  significant weight changes, acute bleeding, or claudication. Tolerating medications well. Cannot tolerate statins.   01/01/2024 - Presents today for follow-up.  Tells me he experienced left-sided facial droopiness with numbness in March, also could not blink out of his left eye, symptoms lasted for about 2 weeks.  Says he was diagnosed with Bell's palsy. Says symptoms have improved. Denies any recent stroke-like symptoms.  He is scheduled to see Dr. Mona in June. Says when he bends over and stands up, he gets dizzy. Tells me he gets dizzy spells often. Denies any chest pain, shortness of breath, palpitations, syncope, presyncope, orthopnea, PND, swelling or significant weight changes, acute bleeding, or claudication.  04/01/2024  - patient comes in today for 59-month follow-up appointment. today for 75-month follow-up appointment.  Initial EKG gave preliminary report of acute STEMI with ST segment elevation in leads II, III and aVF.  Repeat EKG showed similar preliminary report with acute STEMI.  Upon entering patient room today, he does not appear well.  He is clammy, clutching his chest and describes chest pain that began at 1030 this morning.  He tells me he has had intermittent sharp chest pain for a while, but says he has never had chest pain this severe before.  He describes it as sharp, at the center of his chest, 10 out of 10 in intensity.  Tells me he feels short of breath with this.  Vital signs are all stable. Inpatient Cardiology was consulted along with DOD, Dr. Wilbert Bihari who is at [facility] today. Dr. Gordy Bergamo looked at EKG images and stated no STEMI, see below.  ED visit 04/01/2024 at [facility] for nonspecific chest pain, workup overall unremarkable. R/O for  ACS, trops negative.   04/21/2024 - Today he presents for ED follow-up. Doing better. Tells me he has had a few episodes of chest pain since I last saw him, but not as severe. Currently receiving Leqvio  infusions. Used to see Dr. Rachele in the past, does not see him anymore. Denies any  recent chest pain, shortness of breath, palpitations, syncope, presyncope, dizziness, orthopnea, PND, swelling or significant weight changes, acute bleeding, or claudication.  SH: Occasionally uses marijuana and smokes 1 PPD, trying to quit. Denies any alcohol use.   Studies Reviewed: SABRA    EKG: EKG is not ordered today.      Carotid duplex 05/2023: Summary:  Right Carotid: Velocities in the right ICA are consistent with a 1-39%  stenosis. Non-hemodynamically significant plaque <50% noted in the  CCA. The ECA appears <50% stenosed.   Left Carotid: Velocities in the left ICA are consistent with a 1-39%  stenosis. Non-hemodynamically significant plaque <50% noted in the  CCA. The ECA appears <50% stenosed.   Vertebrals:  Bilateral vertebral arteries demonstrate antegrade flow.  Subclavians: Normal flow hemodynamics were seen in bilateral subclavian arteries.  Lexiscan  04/2023:   Lexiscan  stress without EKG changes   Myoview  scan stress images show mild decreased activity in the inferior wall (mid/distal). In the recovery images there was improvement in this region consistent with a small area of mild ischemia. When compared to scan from 2019, subtle thinning present in previous study  but in both the  rest and the stress images.   Note, motion during study may interfere with accuracy.   LVEF calculated at 70% with normal wall motion   Overall low risk study  Echo 03/2023: 1. Left ventricular ejection fraction, by estimation, is 60 to 65%. The  left ventricle has normal function. The left ventricle has no regional  wall motion abnormalities. There is mild left ventricular hypertrophy.  Left ventricular diastolic parameters  are consistent with Grade I diastolic dysfunction (impaired relaxation).   2. Right ventricular systolic function is normal. The right ventricular  size is normal. Tricuspid regurgitation signal is inadequate for assessing  PA pressure.   3. The mitral valve is  normal in structure. No evidence of mitral valve  regurgitation. No evidence of mitral stenosis.   4. The aortic valve is tricuspid. Aortic valve regurgitation is not  visualized. No aortic stenosis is present.   5. The inferior vena cava is normal in size with greater than 50%  respiratory variability, suggesting right atrial pressure of 3 mmHg.   Comparison(s): No significant change from prior study.  Lexiscan  11/2020:  No diagnostic ST segment changes to indicate ischemia. No significant myocardial perfusion defects on stress imaging to indicate scar or ischemia. There is diaphragmatic attenuation noted, mainly on the rest images. This is a low risk study. Nuclear stress EF: 63%.   Physical Exam:   VS:  BP (!) 138/98 (BP Location: Left Arm)   Pulse 77   Ht 5' 7 (1.702 m)   Wt 207 lb 6.4 oz (94.1 kg)   SpO2 94%   BMI 32.48 kg/m    Wt Readings from Last 3 Encounters:  04/21/24 207 lb 6.4 oz (94.1 kg)  04/01/24 208 lb 1.8 oz (94.4 kg)  04/01/24 208 lb 3.2 oz (94.4 kg)    GEN: Obese, 58 y.o. male in no acute distress NECK: No JVD; No carotid bruits CARDIAC: S1/S2, RRR, no murmurs, rubs, gallops RESPIRATORY:  Clear to auscultation without rales, wheezing or rhonchi  ABDOMEN: Soft, non-tender, non-distended EXTREMITIES:  No edema; No deformity   ASSESSMENT AND PLAN: .    Coronary atherosclerosis, chest pain of uncertain etiology, and aortic atherosclerosis Does admit to some chest pain episodes since last office visit, not as severe as before. Cannot tolerate Aspirin . Cannot tolerate statins or Zetia  - have added to his allergy list.  Currently receiving Leqvio .  Tolerating Imdur  well. Will increase Imdur  to 30 mg BID. No other medication changes at this time. Heart healthy diet and regular cardiovascular exercise encouraged. Care and ED precautions discussed.     2. HTN, orthostatic hypotension, orthostatic dizziness BP mildly elevated today. SBP goal < 130.  Denies any  recent orthostatic symptoms recently. Will increase Imdur  to 30 mg BID. Discussed conservative measures to help improve his symptoms. Discussed to monitor BP at home at least 2 hours after medications and sitting for 5-10 minutes.  Will bring him back in 1-2 weeks for a BP check and monitor calibration.    3. Mixed HLD Hx of multiple statin intolerances and could not tolerate Zetia .  He is receiving Leqvio  infusions. Continue to follow-up with Dr. Mona.    4. Hx of CVA, carotid artery stenosis Denies any symptoms. Continue to follow with PCP.  Cannot tolerate low dose Aspirin , unable to tolerate statins. Currently receiving Leqvio  infusions. Heart healthy diet and regular cardiovascular exercise encouraged.    5. Polysubstance abuse Substance abuse cessation encouraged and discussed. He verbalized understanding.   6. Elevated serum creatinine Noted on most recent labs with scR at 1.47 with eGFR 55. Will recheck BMET.  Avoid nephrotoxic agents.  Continue follow-up with PCP.  I spent a total duration of 30 minutes reviewing prior notes, reviewing outside records including  labs, face-to-face counseling of medical condition, pathophysiology, evaluation, management, and documenting the findings in the note.   Dispo: Follow-up with Dr. Debera or APP in 4-6 weeks post hospital d/c or sooner if anything changes.   Signed, Almarie Crate, NP

## 2024-05-03 DIAGNOSIS — G4733 Obstructive sleep apnea (adult) (pediatric): Secondary | ICD-10-CM | POA: Diagnosis not present

## 2024-05-05 ENCOUNTER — Encounter: Payer: Self-pay | Admitting: Internal Medicine

## 2024-05-05 ENCOUNTER — Ambulatory Visit: Attending: Cardiology | Admitting: *Deleted

## 2024-05-05 ENCOUNTER — Encounter

## 2024-05-05 ENCOUNTER — Other Ambulatory Visit (HOSPITAL_BASED_OUTPATIENT_CLINIC_OR_DEPARTMENT_OTHER): Payer: Self-pay

## 2024-05-05 VITALS — BP 120/80 | HR 74 | Ht 67.0 in | Wt 208.4 lb

## 2024-05-05 DIAGNOSIS — I10 Essential (primary) hypertension: Secondary | ICD-10-CM | POA: Diagnosis present

## 2024-05-05 MED ORDER — BLOOD PRESSURE MONITOR DEVI
1.0000 | 0 refills | Status: AC
Start: 2024-05-05 — End: ?
  Filled 2024-05-05: qty 1, 30d supply, fill #0

## 2024-05-05 NOTE — Progress Notes (Signed)
 Presents for nurse visit for a blood pressure check per last visit request. Reports his home BP monitor doesn't work and he was unable to get new BP monitor. BP monitor prescription sent to [facility]. Reports taking all medications as prescribed without side effects. Denies dizziness, chest pain or sob. Reports he does still have dizziness periodically. Medications reviewed. Vitals done and routed to provider for review.

## 2024-05-05 NOTE — Patient Instructions (Signed)
Your physician recommends that you continue on your current medications as directed. Please refer to the Current Medication list given to you today. Your physician recommends that you schedule a follow-up appointment in: as planned 

## 2024-05-12 ENCOUNTER — Other Ambulatory Visit: Payer: Self-pay

## 2024-05-12 ENCOUNTER — Ambulatory Visit

## 2024-05-12 VITALS — BP 116/87 | HR 75 | Temp 97.5°F | Resp 24 | Ht 67.0 in | Wt 207.6 lb

## 2024-05-12 DIAGNOSIS — I2510 Atherosclerotic heart disease of native coronary artery without angina pectoris: Secondary | ICD-10-CM | POA: Diagnosis not present

## 2024-05-12 DIAGNOSIS — F1110 Opioid abuse, uncomplicated: Secondary | ICD-10-CM | POA: Diagnosis not present

## 2024-05-12 DIAGNOSIS — G8929 Other chronic pain: Secondary | ICD-10-CM

## 2024-05-12 DIAGNOSIS — J449 Chronic obstructive pulmonary disease, unspecified: Secondary | ICD-10-CM | POA: Diagnosis not present

## 2024-05-12 DIAGNOSIS — F419 Anxiety disorder, unspecified: Secondary | ICD-10-CM

## 2024-05-12 DIAGNOSIS — Z23 Encounter for immunization: Secondary | ICD-10-CM | POA: Diagnosis not present

## 2024-05-12 DIAGNOSIS — I10 Essential (primary) hypertension: Secondary | ICD-10-CM

## 2024-05-12 MED ORDER — BUPRENORPHINE HCL-NALOXONE HCL 8-2 MG SL FILM: ORAL_FILM | SUBLINGUAL | 0 refills | Status: DC

## 2024-05-12 MED ORDER — PREGABALIN 75 MG PO CAPS: ORAL_CAPSULE | ORAL | 0 refills | Status: DC

## 2024-05-12 MED ORDER — ALBUTEROL SULFATE HFA 108 (90 BASE) MCG/ACT IN AERS: 1.0000 | INHALATION_SPRAY | RESPIRATORY_TRACT | 2 refills | Status: DC | PRN

## 2024-05-12 NOTE — Progress Notes (Signed)
 Established Patient Office Visit  Subjective   Patient ID: [name], male    DOB: [birth year]  Age: 58 y.o. MRN: [id number]  Patient is not exactly why he is here today but is still having pain despite Suboxone  1.5 films a day.  Patient has trouble swallowing but gets his esophagus stretched by gastroenterology routinely, he sees ophthalmology, and is currently following with sports med through [facility] for his right medial ACL tear.  He is engaging in regular PT for this.          Objective:     BP 116/87 (BP Location: Left Arm, Patient Position: Sitting)   Pulse 75   Temp (!) 97.5 F (36.4 C) (Oral)   Resp (!) 24   Ht 5' 7 (1.702 m)   Wt 207 lb 9.6 oz (94.2 kg)   SpO2 97% Comment: room air  BMI 32.51 kg/m    Physical Exam Constitutional:      Appearance: Normal appearance.  HENT:     Nose: Nose normal.  Eyes:     General: No scleral icterus.    Conjunctiva/sclera: Conjunctivae normal.  Cardiovascular:     Rate and Rhythm: Regular rhythm.  Pulmonary:     Effort: Pulmonary effort is normal.     Breath sounds: Normal breath sounds. No wheezing.  Abdominal:     General: There is distension.  Musculoskeletal:     Right lower leg: No edema.     Left lower leg: No edema.  Skin:    General: Skin is warm.     Coloration: Skin is not pale.     Comments: Two lateral well-healed scars on lower abdomen from previous ostomy bags, lower midline abdominal scar from diverticulitis requiring surgery  Neurological:     General: No focal deficit present.     Mental Status: He is alert and oriented to person, place, and time.  Psychiatric:     Comments: Low mood      No results found for any visits on 05/12/24.    The ASCVD Risk score (Arnett DK, et al., 2019) failed to calculate for the following reasons:   Risk score cannot be calculated because patient has a medical history suggesting prior/existing ASCVD    Assessment & Plan:   Assessment & Plan Encounter  for immunization Given today Orders:   Flu vaccine trivalent PF, 6mos and older(Flulaval,Afluria,Fluarix,Fluzone)  Chronic obstructive pulmonary disease, unspecified COPD type (HCC) Patient is still smoking cigarettes and occasional marijuana.  Discussed deleterious effects of smoking on his lung disease.  Patient is without cough and mucus production today.  Lungs clear to auscultation bilaterally.  Patient breathing comfortably on room air.  Plan to refill albuterol  inhaler, which is helpful when he needs it. Orders:   albuterol  (VENTOLIN  HFA) 108 (90 Base) MCG/ACT inhaler; Inhale 1 puff into the lungs every 4 (four) hours as needed for wheezing or shortness of breath.  Opioid use disorder, mild, abuse (HCC) At the last office visit in March, Suboxone  reduced from twice daily to once daily.  This was done because the patient was not requiring it twice a day at that time.  Today, patient says that Suboxone  no longer helps with his chronic back pain and leg pain.  He only takes the medication to prevent the symptoms of withdrawal such as diarrhea, stomach pain, and anxiety.  The patient still takes Xanax that he gets from a family member, although he does not take it every day.  The sports  medicine doctor he is seeing for his right knee injury is not able to prescribe pain medicine because of his current Suboxone  therapy through our clinic.  Plan to go back to taking 2 films a day to better control patient's pain.  Plan to see patient back in 3 to 4 weeks to have him meet with Dr. Rosan who has opioid management expertise and can provide treatment recommendations for acute and long-term pain management.  Patient understands and is agreeable. Orders:   Buprenorphine  HCl-Naloxone  HCl (SUBOXONE ) 8-2 MG FILM; PLACE 1 STRIPS UNDER THE TONGUE TWICE DAILY  Other chronic pain Patient has chronic back, neck, stomach, bilateral hand, and bilateral knee pain.  He worked in [employer] for most of his life,  he is on disability now due to pain.  Patient is unable to sleep through the night because his pain wakes him up.  Patient has had several operations on his abdomen and on his right knee.  His right knee has a more acute ACL tear that he is currently doing PT for and follows with a sports medicine doctor through [facility] for.  Encouraged patient to continue PT, he wants to avoid more surgery on his right knee.  Patient has had a bad experience with gabapentin  in the past and did not tolerate SNRIs.  Plan to start pregabalin  75 mg only at night for the first 7 days.  If he is tolerating this okay, recommended patient increase to 75 mg twice a day.  Will see patient within the next month to follow-up and see how this has helped his pain and if it has allowed him to sleep through the night. Orders:   pregabalin  (LYRICA ) 75 MG capsule; Take 1 capsule (75 mg total) by mouth at bedtime for 7 days, THEN 1 capsule (75 mg total) 2 (two) times daily for 21 days.  Primary hypertension Blood pressure 116/87 today, plan to continue HCTZ 25 mg daily.  On Imdur  30 mg with recent increase to twice daily earlier in August.    Anxiety Patient has had anxiety for several years and has tried duloxetine , buspirone , sertraline  and most recently fluoxetine  and hydroxyzine .  He was not able to tolerate any of these medications.  Patient is able to tolerate Xanax which helps his anxiety, he consistently gets this from a family member.  Discussed the vicious cycle of treating anxiety with marijuana and benzodiazepines.  At the last visit in March, referral to psychiatry was placed and he has upcoming appointment on October 13.  Patient understands negative effects of marijuana on anxiety and is motivated to try therapy.  Patient lives with his girlfriend and is able to perform activities of daily living.  Plan to follow-up with patient on how therapy goes, it is likely that his anxiety and chronic pain are related.  Hopefully the pain  can be controlled and it will positively impact his anxiety.    CVD (cardiovascular disease) Patient has cardiovascular disease managed with [facility] providers and hyperlipidemia managed by Dr. Mona.  He is not able to tolerate aspirin  or Zetia  or statins, he is therefore on inclisiran infusions.  Given hyperlipidemia and cardiovascular disease, antiplatelet therapy is recommended.  Patient is not able to tolerate aspirin .  Today, I messaged Almarie Crate, who routinely sees him in cardiology, and suggested starting clopidogrel at their next appointment together at the end of September.  Plan to follow-up decision to start antiplatelet therapy.     Return in about 4 weeks (  around 06/09/2024) for pain mgmt follow up with Dr. ROSAN (as soon as available with Dr. ROSAN in about 3-4weeks).    Viktoria King, DO

## 2024-05-12 NOTE — Assessment & Plan Note (Signed)
 Patient is still smoking cigarettes and occasional marijuana.  Discussed deleterious effects of smoking on his lung disease.  Patient is without cough and mucus production today.  Lungs clear to auscultation bilaterally.  Patient breathing comfortably on room air.  Plan to refill albuterol  inhaler, which is helpful when he needs it. Orders:   albuterol  (VENTOLIN  HFA) 108 (90 Base) MCG/ACT inhaler; Inhale 1 puff into the lungs every 4 (four) hours as needed for wheezing or shortness of breath.

## 2024-05-12 NOTE — Assessment & Plan Note (Signed)
 Blood pressure 116/87 today, plan to continue HCTZ 25 mg daily.  On Imdur  30 mg with recent increase to twice daily earlier in August.

## 2024-05-12 NOTE — Assessment & Plan Note (Signed)
 Patient has had anxiety for several years and has tried duloxetine , buspirone , sertraline  and most recently fluoxetine  and hydroxyzine .  He was not able to tolerate any of these medications.  Patient is able to tolerate Xanax which helps his anxiety, he consistently gets this from a family member.  Discussed the vicious cycle of treating anxiety with marijuana and benzodiazepines.  At the last visit in March, referral to psychiatry was placed and he has upcoming appointment on October 13.  Patient understands negative effects of marijuana on anxiety and is motivated to try therapy.  Patient lives with his girlfriend and is able to perform activities of daily living.  Plan to follow-up with patient on how therapy goes, it is likely that his anxiety and chronic pain are related.  Hopefully the pain can be controlled and it will positively impact his anxiety.

## 2024-05-12 NOTE — Patient Instructions (Addendum)
 It was wonderful seeing you today!   Please remember to...  1) START taking pregabalin  75 mg (1 tablet) at bedtime for 7 days. If you're tolerating it well and want to try taking it twice a day, you can start taking 75 mg (1 tablet) in the morning and one at bedtime.   2) Go back to your previous suboxone  frequency, taking one film TWICE a day   3) Continue to work on smoking cessation and eating healthy. Good job on your PT! I hope your knee starts to feel better.   If you have any questions please feel free to the call the clinic at anytime at [phone].  Have a blessed day,  Dr. Charmayne

## 2024-05-13 ENCOUNTER — Telehealth: Payer: Self-pay

## 2024-05-13 ENCOUNTER — Other Ambulatory Visit: Payer: Self-pay | Admitting: Student

## 2024-05-13 DIAGNOSIS — G8929 Other chronic pain: Secondary | ICD-10-CM

## 2024-05-13 MED ORDER — PREGABALIN 75 MG PO CAPS
ORAL_CAPSULE | ORAL | 0 refills | Status: DC
Start: 2024-05-13 — End: 2024-05-18

## 2024-05-13 NOTE — Telephone Encounter (Signed)
 Received a fax from the pharmacy regarding a rx for pregablin. Dr.Lyles sent in the rx and is not covered under medicaid. A new provider will need to send in the rx. ([place]son,Amoako,Juberg,Patel)

## 2024-05-17 ENCOUNTER — Other Ambulatory Visit (HOSPITAL_BASED_OUTPATIENT_CLINIC_OR_DEPARTMENT_OTHER): Payer: Self-pay

## 2024-05-17 ENCOUNTER — Encounter (HOSPITAL_BASED_OUTPATIENT_CLINIC_OR_DEPARTMENT_OTHER): Payer: Self-pay

## 2024-05-18 ENCOUNTER — Other Ambulatory Visit: Payer: Self-pay | Admitting: Internal Medicine

## 2024-05-18 ENCOUNTER — Other Ambulatory Visit: Payer: Self-pay | Admitting: *Deleted

## 2024-05-18 DIAGNOSIS — G8929 Other chronic pain: Secondary | ICD-10-CM

## 2024-05-18 MED ORDER — PREGABALIN 75 MG PO CAPS: ORAL_CAPSULE | ORAL | 0 refills | Status: DC

## 2024-05-18 NOTE — Telephone Encounter (Signed)
 rimary Information  Source  [name] (Patient)   Subject  [name], [name] (Patient)   Topic  Clinical - Prescription Issue   Voided CRM from Changed Reason for Contact  [id number]    Communication  Reason for CRM: Shival calling from - [facility]  patient getting a medication for suboxone  showing provider name Charmayne Holmes, as well and    temazepam (RESTORIL) 22.5 MG capsule both are controlled substance and want to make sure coming from the same practice                -----------------------------------------------------------------------    From previous Reason for Contact - Medication Refill:    Medication:        Has the patient contacted their pharmacy?        (Agent: If no, request that the patient contact the pharmacy for the refill. If patient does not wish to contact the pharmacy document the reason why and proceed with request.)    (Agent: If yes, when and what did the pharmacy advise?)        This is the patient's preferred pharmacy:    [id number] - [place], [place] - [address]    [address] [place] [address]    Phone: [phone] Fax: [phone]        [facility] - [place], [place] - [address]    [address] [place] [address]    Phone: [phone] Fax: [phone]        [facility]    [address]    [place] [address]    Phone: [phone] Fax: [phone]        Is this the correct pharmacy for this prescription?        If no, delete pharmacy and type the correct one.        Has the prescription been filled recently?            Is the patient out of the medication?            Has the patient been seen for an appointment in the last year OR does the patient have an upcoming appointment?            Can we respond through MyChart?             Agent: Please be advised that Rx refills may take up to 3 business days. We ask that you follow-up with your pharmacy.

## 2024-05-18 NOTE — Telephone Encounter (Signed)
 Copied from CRM [id number]. Topic: Clinical - Prescription Issue >> May 18, 2024  9:29 AM Diannia H wrote: Reason for CRM: Patient called and stated he couldn't get his rx filled because the provider that wrote the rx is not apart of medicaid so it would need to be resent for pregabalin  (LYRICA ) 75 MG capsule  [id number] - [place], [place] - [address] [place] [address] Phone: [phone] Fax: [phone] Hours: Not open 24 hours

## 2024-05-18 NOTE — Telephone Encounter (Deleted)
 Copied from CRM #[id number]. Topic: Clinical - Prescription Issue >> May 18, 2024  1:17 PM Alfonso ORN wrote: Reason for CRM: [facility]ival [facility]  patient getting a medication for suboxone  showing provider name Charmayne Holmes, as well and  temazepam (RESTORIL) 22.5 MG capsule both are controlled substance and want to make sure coming from the same practice     ----------------------------------------------------------------------- From previous Reason for Contact - Medication Refill: Medication:   Has the patient contacted their pharmacy?   (Agent: If no, request that the patient contact the pharmacy for the refill. If patient does not wish to contact the pharmacy document the reason why and proceed with request.) (Agent: If yes, when and what did the pharmacy advise?)  This is the patient's preferred pharmacy:  [id number] [place], [place] - [address] [place] [address] Phone: [phone] Fax: [phone]  [facility] - [place], [place] - [address] [place] [address] Phone: [phone] Fax: [phone]  [place] - [facility] [address] [place] [address] Phone: [phone] Fax: [phone]  Is this the correct pharmacy for this prescription?   If no, delete pharmacy and type the correct one.   Has the prescription been filled recently?    Is the patient out of the medication?    Has the patient been seen for an appointment in the last year OR does the patient have an upcoming appointment?    Can we respond through MyChart?    Agent: Please be advised that Rx refills may take up to 3 business days. We ask that you follow-up with your pharmacy.

## 2024-05-18 NOTE — Telephone Encounter (Signed)
 I talked to [name], [facility] - who wanted to vertify Dr Charmayne and Dr Tobie are from the same office ,here at [facility].  He was informed this is correct.

## 2024-05-19 ENCOUNTER — Other Ambulatory Visit: Payer: Self-pay | Admitting: Student

## 2024-05-19 ENCOUNTER — Telehealth: Payer: Self-pay | Admitting: *Deleted

## 2024-05-19 DIAGNOSIS — F1110 Opioid abuse, uncomplicated: Secondary | ICD-10-CM

## 2024-05-19 DIAGNOSIS — J449 Chronic obstructive pulmonary disease, unspecified: Secondary | ICD-10-CM

## 2024-05-19 MED ORDER — BUPRENORPHINE HCL-NALOXONE HCL 8-2 MG SL FILM: ORAL_FILM | SUBLINGUAL | 0 refills | Status: DC

## 2024-05-19 NOTE — Telephone Encounter (Signed)
 Copied from CRM #[id number]. Topic: Clinical - Prescription Issue >> May 19, 2024 11:32 AM Mercer PEDLAR wrote: Reason for CRM: Patient called and stated he couldn't get his rx filled because the provider that wrote the rx is not a part of medicaid so it would need to be resent for  albuterol  (VENTOLIN  HFA) 108 (90 Base) MCG/ACT inhaler  Buprenorphine  HCl-Naloxone  HCl (SUBOXONE ) 8-2 MG FILM

## 2024-05-23 NOTE — Telephone Encounter (Signed)
 Dr.Koomson can you send in a new rx for Ventolin  HFA as requested below?

## 2024-05-24 MED ORDER — ALBUTEROL SULFATE HFA 108 (90 BASE) MCG/ACT IN AERS: 1.0000 | INHALATION_SPRAY | RESPIRATORY_TRACT | 2 refills | Status: AC | PRN

## 2024-05-25 ENCOUNTER — Telehealth: Payer: Self-pay

## 2024-05-25 NOTE — Telephone Encounter (Signed)
 Lamar Griffin (Key: [id number]) PA Case ID #: [id number] Rx #: [id number] Need Help? Call us  at [phone] Outcome N/A today by [facility] 2017 NCPDP We received a prior authorization request for the member and product listed above. The Community and [facility] Prior Authorization Team is not able to review this request because the brand name for the requested product is preferred on the formulary and is covered without prior authorization requirements. Please have the dispensing pharmacy fill the prescription using the brand name version. If this request is for the generic version, please resubmit this request indicating such, along with supporting documentations for coverage exception review. The pharmacy may obtain assistance by contacting the [employer] Help Desk anytime at [phone]. Drug Buprenorphine  HCl-Naloxone  HCl 8-2MG  films ePA cloud logo Form [employer] Medicaid Electronic Prior Authorization Form [phone] NCPDP) Original Claim Info [phone] Brand is PreferredUnauthorized DEA ClassPrescriber is not authorized for drug`sDEA Class

## 2024-05-25 NOTE — Telephone Encounter (Signed)
 Prior Authorization for patient (Buprenorphine  HCl-Naloxone  HCl 8-2MG  films) came through on cover my meds was submitted with last office notes and labs awaiting approval or denial.  KEY:[id number]

## 2024-05-26 NOTE — Telephone Encounter (Signed)
 I called and spoke to the pharmacy, the pharmacists stated the patient picked up the brand name rx on 05/25/24.

## 2024-05-27 ENCOUNTER — Ambulatory Visit: Payer: Self-pay

## 2024-05-27 NOTE — Telephone Encounter (Signed)
 RTC to patient.  Patient has decided that he will gp to the ER as he feels it may not still be the Bell's Palsy.

## 2024-05-27 NOTE — Telephone Encounter (Signed)
 FYI Only or Action Required?: FYI only for provider.  Patient was last seen in primary care on 05/12/2024 by Charmayne Holmes, DO.  Called Nurse Triage reporting Tinnitus and Facial Swelling.  Symptoms began several days ago.  Interventions attempted: OTC medications: eye drops.  Symptoms are: gradually worsening.  Triage Disposition: Go to ED Now (or PCP Triage)  Patient/caregiver understands and will follow disposition?: Yes                             Copied from CRM [id number]. Topic: Clinical - Red Word Triage >> May 27, 2024  8:44 AM Susanna ORN wrote: Red Word that prompted transfer to Nurse Triage: Patient states that his left eye has been shut for 3 days now & he has ringing in his left ear. He states last time something like this happened, he was told he had Bell's Palsy. He states he's not sure if that's what it is this time. Patient states the ringing in his left ear goes and comes and he can barely open his eye. Wants an appt to be seen today, if possible. Reason for Disposition  Bell's palsy suspected (i.e., weakness on only one side of the face, developing over hours to days, no other symptoms)  Answer Assessment - Initial Assessment Questions 1. SYMPTOM: What is the main symptom you are concerned about? (e.g., weakness, numbness)     Left eye is shut, tinnitus 2. ONSET: When did this start? (e.g., minutes, hours, days; while sleeping)     A few days to a week ago, tinnitus started early this morning 3. LAST NORMAL: When was the last time you (the patient) were normal (no symptoms)?     About a week ago  4. PATTERN Does this come and go, or has it been constant since it started?  Is it present now?     Eye symptoms have been constant, tinnitus comes and goes 5. CARDIAC SYMPTOMS: Have you had any of the following symptoms: chest pain, difficulty breathing, palpitations?     Denies chest pain, denies difficulty breathing 6. NEUROLOGIC  SYMPTOMS: Have you had any of the following symptoms: headache, dizziness, vision loss, double vision, changes in speech, unsteady on your feet?     Dizzy spells at baseline, headache that comes and goes, mild lack of sensation on left side of face, denies drooping, denies vision changes, denies speech changes, denies feeling unsteady on feet, denies one-sided weakness 7. OTHER SYMPTOMS: Do you have any other symptoms?     Swelling of left eye    Patient reported Bell's palsy episode within last year. Advised ED. Patient verbalized understanding and agree to go.  Protocols used: Neurologic Deficit-A-AH

## 2024-05-31 ENCOUNTER — Encounter: Payer: Self-pay | Admitting: Internal Medicine

## 2024-06-04 NOTE — Progress Notes (Signed)
 Internal Medicine Clinic Attending  I was physically present during the key portions of the resident provided service and participated in the medical decision making of patient's management care. I reviewed pertinent patient test results.  The assessment, diagnosis, and plan were formulated together and I agree with the documentation in the resident's note.  [name] does not feel that the OUD treatment is managing his pain at all.  He wishes to stop the therapy and discuss other pain management options.  Dr. Rosan is particularly well versed  in this area and it would be ideal if his expertise was available to guide an approach at a future visit.  [name] has housing insecurity and concerned about the upcoming cool/cold season exacerbating his pain.  Jason Mliss Dragon, MD

## 2024-06-07 ENCOUNTER — Encounter: Payer: Self-pay | Admitting: Nurse Practitioner

## 2024-06-07 ENCOUNTER — Ambulatory Visit: Attending: Nurse Practitioner | Admitting: Nurse Practitioner

## 2024-06-07 VITALS — BP 110/78 | HR 88 | Ht 67.0 in | Wt 212.8 lb

## 2024-06-07 DIAGNOSIS — I10 Essential (primary) hypertension: Secondary | ICD-10-CM

## 2024-06-07 DIAGNOSIS — Z8673 Personal history of transient ischemic attack (TIA), and cerebral infarction without residual deficits: Secondary | ICD-10-CM

## 2024-06-07 DIAGNOSIS — I2510 Atherosclerotic heart disease of native coronary artery without angina pectoris: Secondary | ICD-10-CM | POA: Diagnosis not present

## 2024-06-07 DIAGNOSIS — R42 Dizziness and giddiness: Secondary | ICD-10-CM | POA: Diagnosis present

## 2024-06-07 DIAGNOSIS — I700 Atherosclerosis of aorta: Secondary | ICD-10-CM

## 2024-06-07 DIAGNOSIS — F1910 Other psychoactive substance abuse, uncomplicated: Secondary | ICD-10-CM

## 2024-06-07 DIAGNOSIS — I951 Orthostatic hypotension: Secondary | ICD-10-CM | POA: Diagnosis present

## 2024-06-07 DIAGNOSIS — R079 Chest pain, unspecified: Secondary | ICD-10-CM | POA: Diagnosis not present

## 2024-06-07 DIAGNOSIS — E782 Mixed hyperlipidemia: Secondary | ICD-10-CM | POA: Diagnosis present

## 2024-06-07 DIAGNOSIS — I6529 Occlusion and stenosis of unspecified carotid artery: Secondary | ICD-10-CM

## 2024-06-07 DIAGNOSIS — Z79899 Other long term (current) drug therapy: Secondary | ICD-10-CM | POA: Diagnosis present

## 2024-06-07 DIAGNOSIS — N179 Acute kidney failure, unspecified: Secondary | ICD-10-CM

## 2024-06-07 DIAGNOSIS — G510 Bell's palsy: Secondary | ICD-10-CM | POA: Diagnosis present

## 2024-06-07 NOTE — Progress Notes (Unsigned)
 Cardiology Office Note:  .   Date:  06/07/2024 ID:  [name], DOB [birth year], MRN [id number] PCP: Celestina Czar, MD  [facility] Providers Cardiologist:  Jayson Sierras, MD {  History of Present Illness: .   [name] is a 58 y.o. 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   Last seen by Dr. Sierras on November 21, 2020. Was referred for evaluation of chest discomfort. Lexiscan  Myoview  was obtained and was normal.   ED visit on February 18, 2023 for intermitent epigastric chest discomfort at [facility]. Described more CP at rest, described as burning sensation at center of his chest. CTA of chest revealed no acute PE with evidence of new small solid nodules medially in left lower lobe, hepatic steatosis. CXR showed patchy opacities in LLL. UDS positive for Marijuana and benzos. Trops negative.   Saw patient for follow-up on March 27, 2023.  He noted CP started one month ago. Described it as sharp/indigestion, right-sided, occurred more with exertion, lasted for a few minutes, and sat down and improved, noticed with working outside. Rates 7-8/10 when it occurs. Denied any shortness of breath, palpitations, syncope, presyncope, dizziness, orthopnea, PND, swelling or significant weight changes, acute bleeding, or claudication. Stated he is not on any oxycodone  or cocaine. Echo benign. NST revealed mild decreased activity along inferior wall, findings consistent with small area of mild ischemia.  Study was determined to be low risk.  07/03/2023 - Doing well. Denies any recurrent CP since last office visit. Denies any chest pain, shortness of breath, palpitations, syncope, presyncope, dizziness, orthopnea, PND, swelling or  significant weight changes, acute bleeding, or claudication. Tolerating medications well. Cannot tolerate statins.   01/01/2024 - Presents today for follow-up.  Tells me he experienced left-sided facial droopiness with numbness in March, also could not blink out of his left eye, symptoms lasted for about 2 weeks.  Says he was diagnosed with Bell's palsy. Says symptoms have improved. Denies any recent stroke-like symptoms.  He is scheduled to see Dr. Mona in June. Says when he bends over and stands up, he gets dizzy. Tells me he gets dizzy spells often. Denies any chest pain, shortness of breath, palpitations, syncope, presyncope, orthopnea, PND, swelling or significant weight changes, acute bleeding, or claudication.  04/01/2024  - patient comes in today for 14-month follow-up appointment.  Initial EKG gave preliminary report of acute STEMI with ST segment elevation in leads II, III and aVF.  Repeat EKG showed similar preliminary report with acute STEMI.  Upon entering patient room today, he does not appear well.  He is clammy, clutching his chest and describes chest pain that began at 1030 this morning.  He tells me he has had intermittent sharp chest pain for a while, but says he has never had chest pain this severe before.  He describes it as sharp, at the center of his chest, 10 out of 10 in intensity.  Tells me he feels short of breath with this.  Vital signs are all stable. Inpatient Cardiology was consulted along with DOD, Dr. Wilbert Bihari who is at [facility] today. Dr. Gordy Bergamo looked at EKG images and stated no STEMI, see below.  ED visit 04/01/2024 at [facility] for nonspecific chest pain, workup overall unremarkable. R/O for  ACS, trops negative.   04/21/2024 - Today he presents for ED follow-up. Doing better. Tells me he has had a few episodes of chest pain since I last saw him, but not as severe. Currently receiving Leqvio  infusions. Used to see Dr. Rachele in the past, does not see him anymore. Denies any  recent chest pain, shortness of breath, palpitations, syncope, presyncope, dizziness, orthopnea, PND, swelling or significant weight changes, acute bleeding, or claudication.  06/07/2024 -tells me about a recent ED visit last month for Bell's palsy, he notes left-sided facial droop.  He tells me he has had this before, currently receiving treatment for this. Denies any acute stroke like symptoms.  Tells me he has had a couple chest pain episodes since last office visit, says almost went to the ED for 1 episode but did not. Says episodes are similar to previous office visit on 04/01/2024. Denies any active chest pain today. Denies any shortness of breath, palpitations, syncope, presyncope, dizziness, orthopnea, PND, swelling or significant weight changes, acute bleeding, or claudication.  SH: Occasionally uses marijuana and smokes 1 PPD, trying to quit. Denies any alcohol use.   Studies Reviewed: SABRA    EKG: EKG Interpretation Date/Time:  Tuesday June 07 2024 16:32:31 EDT Ventricular Rate:  75 PR Interval:  172 QRS Duration:  96 QT Interval:  388 QTC Calculation: 433 R Axis:   16  Text Interpretation: Normal sinus rhythm RSR' or QR pattern in V1 suggests right ventricular conduction delay When compared with ECG of 01-Apr-2024 12:38, PREVIOUS ECG IS PRESENT Confirmed by Miriam Norris [phone]) on 06/07/2024 4:34:39 PM    Carotid duplex 05/2023: Summary:  Right Carotid: Velocities in the right ICA are consistent with a 1-39%  stenosis. Non-hemodynamically significant plaque <50% noted in the  CCA. The ECA appears <50% stenosed.   Left Carotid: Velocities in the left ICA are consistent with a 1-39%  stenosis. Non-hemodynamically significant plaque <50% noted in the  CCA. The ECA appears <50% stenosed.   Vertebrals:  Bilateral vertebral arteries demonstrate antegrade flow.  Subclavians: Normal flow hemodynamics were seen in bilateral subclavian arteries.  Lexiscan  04/2023:   Lexiscan   stress without EKG changes   Myoview  scan stress images show mild decreased activity in the inferior wall (mid/distal). In the recovery images there was improvement in this region consistent with a small area of mild ischemia. When compared to scan from 2019, subtle thinning present in previous study  but in both the  rest and the stress images.   Note, motion during study may interfere with accuracy.   LVEF calculated at 70% with normal wall motion   Overall low risk study  Echo 03/2023: 1. Left ventricular ejection fraction, by estimation, is 60 to 65%. The  left ventricle has normal function. The left ventricle has no regional  wall motion abnormalities. There is mild left ventricular hypertrophy.  Left ventricular diastolic parameters  are consistent with Grade I diastolic dysfunction (impaired relaxation).   2. Right ventricular systolic function is normal. The right ventricular  size is normal. Tricuspid regurgitation signal is inadequate for assessing  PA pressure.   3. The mitral valve is normal in structure. No evidence of mitral valve  regurgitation. No evidence of mitral stenosis.   4. The aortic valve is tricuspid. Aortic valve regurgitation is not  visualized. No aortic stenosis is present.   5. The inferior vena cava is normal in size with greater than 50%  respiratory variability, suggesting right atrial pressure of 3 mmHg.  Comparison(s): No significant change from prior study.  Lexiscan  11/2020:  No diagnostic ST segment changes to indicate ischemia. No significant myocardial perfusion defects on stress imaging to indicate scar or ischemia. There is diaphragmatic attenuation noted, mainly on the rest images. This is a low risk study. Nuclear stress EF: 63%.   Physical Exam:   VS:  BP 110/78 (BP Location: Left Arm)   Pulse 88   Ht 5' 7 (1.702 m)   Wt 212 lb 12.8 oz (96.5 kg)   SpO2 94%   BMI 33.33 kg/m    Wt Readings from Last 3 Encounters:  06/08/24 210 lb 8 oz  (95.5 kg)  06/07/24 212 lb 12.8 oz (96.5 kg)  05/12/24 207 lb 9.6 oz (94.2 kg)    GEN: Obese, 58 y.o. male in no acute distress NECK: No JVD; No carotid bruits CARDIAC: S1/S2, RRR, no murmurs, rubs, gallops RESPIRATORY:  Clear to auscultation without rales, wheezing or rhonchi  ABDOMEN: Soft, non-tender, non-distended EXTREMITIES:  No edema; No deformity  NEURO: A&O x 4, able to follow commands, clear speech, left side facial droop, strength and sensation normal throughout.   ASSESSMENT AND PLAN: .    Coronary atherosclerosis, chest pain of uncertain etiology, and aortic atherosclerosis Does admit to some chest pain episodes since last office visit. EKG today is reassuring. Cannot tolerate Aspirin . Cannot tolerate statins or Zetia  - have added to his allergy list.  Currently receiving Leqvio .  Tolerating Imdur  well, no current room to uptitrate this based on BP readings.  Will obtain BMET and magnesium to see if these levels are WNL to begin Ranexa. No other medication changes at this time. Heart healthy diet and regular cardiovascular exercise encouraged. Care and ED precautions discussed.     2. HTN, orthostatic hypotension, orthostatic dizziness, medication management BP stable today. SBP goal < 130.  Denies any recent orthostatic symptoms recently. Discussed conservative measures to help improve his symptoms. Discussed to monitor BP at home at least 2 hours after medications and sitting for 5-10 minutes.  Instructed him to stop hydrochlorothiazide  and will obtain BMET in 1 week.   3. Mixed HLD Hx of multiple statin intolerances and could not tolerate Zetia .  He is receiving Leqvio  infusions. Continue to follow-up with Dr. Mona.  At next office visit, consider obtaining FLP to recheck levels.   4. Hx of CVA, carotid artery stenosis Denies any symptoms. Continue to follow with PCP.  Cannot tolerate low dose Aspirin , unable to tolerate statins. Currently receiving Leqvio  infusions. Heart  healthy diet and regular cardiovascular exercise encouraged.    5. Polysubstance abuse Substance abuse cessation encouraged and discussed. He verbalized understanding.   6. AKI Noted on most recent labs with scR at 1.63 with eGFR 49.  Instructed him to stop hydrochlorothiazide  and will recheck BMET in 1 week. Avoid nephrotoxic agents.  Continue follow-up with PCP.  7. Bell's palsy Noted today on exam with some left-sided droop, currently receiving treatment for this. No pronator drift or any other concern for acute stroke.  Recommend to follow-up with PCP as soon as possible for follow-up.  I spent a total duration of 30 minutes reviewing prior notes, reviewing outside records including  labs, EKG today, face-to-face counseling of medical condition, pathophysiology, evaluation, management, and documenting the findings in the note.   Dispo: Follow-up with Dr. Debera or APP in 4-6 weeks or sooner if anything changes.   Signed, Almarie Crate, NP

## 2024-06-07 NOTE — Patient Instructions (Addendum)
 Medication Instructions:  Your physician has recommended you make the following change in your medication:  Stop taking hydrochlorothiazide   Continue taking all other medications as prescribed   Labwork: BMET and Magnesium  in one week at [facility] in [place] when you see your primary care physician   Testing/Procedures: None   Follow-Up: Your physician recommends that you schedule a follow-up appointment in: 4-6 weeks   Any Other Special Instructions Will Be Listed Below (If Applicable). Thank you for choosing [facility]!     If you need a refill on your cardiac medications before your next appointment, please call your pharmacy.

## 2024-06-08 ENCOUNTER — Encounter: Payer: Self-pay | Admitting: Internal Medicine

## 2024-06-08 ENCOUNTER — Ambulatory Visit (INDEPENDENT_AMBULATORY_CARE_PROVIDER_SITE_OTHER): Admitting: Internal Medicine

## 2024-06-08 ENCOUNTER — Telehealth: Payer: Self-pay | Admitting: *Deleted

## 2024-06-08 VITALS — BP 128/93 | HR 75 | Temp 97.1°F | Ht 67.0 in | Wt 210.5 lb

## 2024-06-08 DIAGNOSIS — R1310 Dysphagia, unspecified: Secondary | ICD-10-CM | POA: Diagnosis not present

## 2024-06-08 DIAGNOSIS — Z860100 Personal history of colon polyps, unspecified: Secondary | ICD-10-CM

## 2024-06-08 DIAGNOSIS — K219 Gastro-esophageal reflux disease without esophagitis: Secondary | ICD-10-CM

## 2024-06-08 DIAGNOSIS — K649 Unspecified hemorrhoids: Secondary | ICD-10-CM | POA: Diagnosis not present

## 2024-06-08 DIAGNOSIS — R1319 Other dysphagia: Secondary | ICD-10-CM

## 2024-06-08 NOTE — Progress Notes (Signed)
 Referring Provider: Celestina Czar, MD Primary Care Physician:  Celestina Czar, MD Primary GI:  Dr. Cindie  Chief Complaint  Patient presents with   Dysphagia    Patient here today due to having issues with swallowing foods and liquids at times. Patient says this has been ongoing for the last several months. Patient is taking Protonix  40 mg bid, occasionally will take a third pill when eating spicy foods.  Patient says he has had issues lately with constipation and has been using stool softeners prn.     HPI:   [name] is a 58 y.o. male who presents to clinic today for follow-up visit.  Patient has significant history of colectomy with end colostomy (later reversed) anal fissure, acute pancreatitis, chronic abdominal pain, diverticulitis with perforation, GERD, Hiatal hernia, HTN, sleep apnea, and polysubstance abuse.   Esophageal dysphagia: Main complaint for me today is ongoing esophageal dysphagia.  Feels as though solids get stuck in his substernal region.  No melena hematochezia.  No chest pain, some epigastric discomfort.  Has had new EGDs in the past with dilation for Schatzki's ring.  Most recent EGD 12/18/2022 which he states helped his symptoms until previous 1 to 2 months.  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  Patient had CT of his chest 08/07/2023 which was largely unremarkable.  Chronic GERD: well controlled on pantoprazole  40 mg twice daily.  History of hemorrhoid disease: well controlled on Anusol  cream as needed.    Chronic periumbilical pain: There is a small area poking under his skin which causes his pain.  He had an umbilical hernia repair with mesh in 2008 with Dr. DeMason.  Since that time he has had multiple abdominal surgeries.  He states that he had diverticulitis while he was in prison.  He was scheduled to undergo a colonoscopy but subsequently developed a perforation from his  diverticulitis.  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  They attempted to reverse this ostomy in 2011, but he required more bowel resection, and was given an ileostomy.  He had his ileostomy reversed in 2013.  He also has an history of an appendectomy.   Met with Dr. Evonnie 12/23/2022 who felt given his multiple prior abdominal surgeries he is high risk for complication including bowel injury.  Recommended surveillance.  Past Medical History:  Diagnosis Date   Acute pancreatitis    Anxiety    Arthritis    Colonic adenoma    COPD (chronic obstructive pulmonary disease) (HCC)    Depression    Diverticulitis of colon with perforation 08/2010   GERD (gastroesophageal reflux disease)    History of hiatal hernia    Hypertension    Polysubstance abuse (HCC)    Renal insufficiency    Sleep apnea    Intolerant of CPAP   Stroke (HCC)    TIA (transient ischemic attack)     Past Surgical History:  Procedure Laterality Date   APPENDECTOMY     BALLOON DILATION N/A 08/04/2022   Procedure: BALLOON DILATION;  Surgeon: Cindie Carlin POUR, DO;  [facility]: [facility];  Service: Endoscopy;  Laterality: N/A;   BIOPSY  12/14/2017   Procedure: BIOPSY;  Surgeon: Shaaron [name], MD;  [facility]: [facility];  Service: Endoscopy;;  gastric    BIOPSY  10/13/2019   Procedure: BIOPSY;  Surgeon: Shaaron [name], MD;  [facility]: [facility];  Service: Endoscopy;;  esophagus  BIOPSY  05/20/2021   Procedure: BIOPSY;  Surgeon: Shaaron [name], MD;  [facility];  Service: Endoscopy;;   BIOPSY  12/18/2022   Procedure: BIOPSY;  Surgeon: Shaaron [name], MD;  [facility];  Service: Endoscopy;;   clamp left in colon after abd surgery     COLON SURGERY  05/2011   [facility]. exp laparotomy with colostomy takedown, lysis of adhesions (3hours), loop ileostomy. complicated by abscess formation requiring percutaneous drainage   colonoscopy via stoma with endoscopy of  [place]  04/2011   [facility]. two sessile polyps in trv colon. complete resection but only partial retrieval. congested mucosa in area 3cm proximal to stoma, erythematous mucosa in Hartmann pouch. repeat tcs 8/2017path-adenomatous polyp   COLONOSCOPY WITH PROPOFOL  N/A 12/14/2017   Dr. Shaaron: 2 4 to 6 mm polyps in the cecum, tubular adenomas, single rectal polyp tubular adenoma, nonbleeding internal hemorrhoids.  Appears to be a surgical clip at the appendiceal orifice.  Next colonoscopy in 5 years   COLONOSCOPY WITH PROPOFOL  N/A 12/18/2022   Procedure: COLONOSCOPY WITH PROPOFOL ;  Surgeon: Shaaron [name], MD;  [facility]: [facility];  Service: Endoscopy;  Laterality: N/A;  8:45 am   ESOPHAGEAL MANOMETRY N/A 12/11/2021   Procedure: ESOPHAGEAL MANOMETRY (EM);  Surgeon: Shila Gustav GAILS, MD;  Location: [facility];  Service: Endoscopy;  Laterality: N/A;   ESOPHAGOGASTRODUODENOSCOPY  03/05/2012   erosive reflux esophagitis, bulbar erosions, gastric erosions/petichae, bx showed gastritis, no H.Pylori. Procedure: ESOPHAGOGASTRODUODENOSCOPY (EGD);  Surgeon: [name] Shaaron, MD;  [facility];  Service: Endoscopy;  Laterality: N/A;  11:30   ESOPHAGOGASTRODUODENOSCOPY (EGD) WITH PROPOFOL  N/A 12/14/2017   Dr. Shaaron: Erosive reflux esophagitis with questionable mild stricturing/non-obstruction at this level status post dilation.  multiple gastric erosions in the stomach biopsy showed reactive gastropathy but no H. pylori.   ESOPHAGOGASTRODUODENOSCOPY (EGD) WITH PROPOFOL  N/A 10/13/2019   negative Barrett's, normal stomach   ESOPHAGOGASTRODUODENOSCOPY (EGD) WITH PROPOFOL  N/A 05/20/2021   inlet patch with superimposed nodule s/p biopsy. Normal esophagus s/p dilation. Normal gastric mucosa. Normal duodenum. Reflux changes on path.   ESOPHAGOGASTRODUODENOSCOPY (EGD) WITH PROPOFOL  N/A 08/04/2022   Procedure: ESOPHAGOGASTRODUODENOSCOPY (EGD) WITH PROPOFOL ;  Surgeon: Cindie Carlin POUR, DO;  Location:  [facility];  Service: Endoscopy;  Laterality: N/A;  1:15 pm   ESOPHAGOGASTRODUODENOSCOPY (EGD) WITH PROPOFOL  N/A 12/18/2022   Procedure: ESOPHAGOGASTRODUODENOSCOPY (EGD) WITH PROPOFOL ;  Surgeon: Shaaron [name], MD;  [facility];  Service: Endoscopy;  Laterality: N/A;   FOOT SURGERY Left    strethching of ligaments   HERNIA REPAIR     umbilical   KNEE SURGERY Right 2006   MALONEY DILATION N/A 12/14/2017   Procedure: AGAPITO DILATION;  Surgeon: Shaaron [name], MD;  [facility];  Service: Endoscopy;  Laterality: N/A;   MALONEY DILATION  05/20/2021   Procedure: AGAPITO DILATION;  Surgeon: Shaaron [name], MD;  [facility];  Service: Endoscopy;;   MALONEY DILATION N/A 12/18/2022   Procedure: AGAPITO HODGKIN;  Surgeon: Shaaron [name], MD;  [facility];  Service: Endoscopy;  Laterality: N/A;   MULTIPLE EXTRACTIONS WITH ALVEOLOPLASTY  02/23/2012   Procedure: MULTIPLE EXTRACION WITH ALVEOLOPLASTY;  Surgeon: Glendia HERO Primrose, DDS;  Location: [facility];  Service: Oral Surgery;  Laterality: Bilateral;  Removal of left mandibular torus, multiple extractions with alveoloplasty   POLYPECTOMY  12/14/2017   Procedure: POLYPECTOMY;  Surgeon: Shaaron [name], MD;  [facility];  Service: Endoscopy;;  POLYPECTOMY  12/18/2022   Procedure: POLYPECTOMY;  Surgeon: Shaaron [name], MD;  [facility];  Service: Endoscopy;;   reversal of colostomy  10/2011   per patient, did not have report   right knee surgery     sigmoid colectomy with end colostomy, Hartmann's pouch  08/2010   [facility]. complicated diverticulitis   UMBILICAL HERNIA REPAIR      Current Outpatient Medications  Medication Sig Dispense Refill   albuterol  (VENTOLIN  HFA) 108 (90 Base) MCG/ACT inhaler Inhale 1 puff into the lungs every 4 (four) hours as needed for wheezing or shortness of breath. 18 each 2   Blood Pressure Monitor DEVI Use as directed. 1 each 0   Buprenorphine   HCl-Naloxone  HCl (SUBOXONE ) 8-2 MG FILM PLACE 1 STRIPS UNDER THE TONGUE TWICE DAILY 60 Film 0   cycloSPORINE (RESTASIS) 0.05 % ophthalmic emulsion Place 1 drop into both eyes 2 (two) times daily.     fluticasone  (FLONASE ) 50 MCG/ACT nasal spray SPRAY 2 SPRAYS INTO EACH NOSTRIL EVERY DAY (Patient taking differently: 2 sprays as needed.) 48 mL 1   hydrocortisone  (ANUSOL -HC) 2.5 % rectal cream Place 1 Application rectally 2 (two) times daily as needed for hemorrhoids. 28 g 2   hydrOXYzine  (ATARAX ) 10 MG tablet Take 1 tablet (10 mg total) by mouth 3 (three) times daily as needed. 30 tablet 0   inclisiran (LEQVIO ) 284 MG/1.5ML SOSY injection Inject 284 mg into the skin once.     isosorbide  mononitrate (IMDUR ) 30 MG 24 hr tablet Take 30 mg by mouth 2 (two) times daily.     pantoprazole  (PROTONIX ) 40 MG tablet TAKE 1 TABLET BY MOUTH TWICE A DAY 60 tablet 2   pregabalin  (LYRICA ) 75 MG capsule Take 1 capsule (75 mg total) by mouth at bedtime for 7 days, THEN 1 capsule (75 mg total) 2 (two) times daily for 21 days. 49 capsule 0   spironolactone  (ALDACTONE ) 25 MG tablet Take 1 tablet (25 mg total) by mouth daily. 30 tablet 3   Current Facility-Administered Medications  Medication Dose Route Frequency Provider Last Rate Last Admin   triamcinolone  acetonide (KENALOG -40) injection 20 mg  20 mg Other Once Janit Thresa HERO, DPM        Allergies as of 06/08/2024 - Review Complete 06/08/2024  Allergen Reaction Noted   Aspirin  Other (See Comments) 07/05/2023   Codeine Itching and Nausea And Vomiting    Latex Other (See Comments) 03/09/2012   Morphine Hives 09/14/2021   Statins Other (See Comments) 01/01/2024   Zestril [lisinopril]  05/27/2017   Zetia  [ezetimibe ] Other (See Comments) 01/01/2024   Adhesive [tape] Rash 03/08/2012    Family History  Problem Relation Age of Onset   Stomach cancer Father    Lung cancer Father    Throat cancer Father    Cancer Father        lung and throat cancer    Hypertension Father    Liver disease Cousin        cirrhosis, etoh   Throat cancer Paternal Uncle    Lung cancer Paternal Uncle    Colon cancer Cousin 52   Heart disease Mother    Hypertension Mother    Colon cancer Maternal Uncle    Throat cancer Paternal Uncle    Lung cancer Paternal Uncle    Throat cancer Paternal Uncle    Lung cancer Paternal Uncle     Social History   Socioeconomic History   Marital status: Single    Spouse name:  Not on file   Number of children: 0   Years of education: Not on file   Highest education level: Not on file  Occupational History   Not on file  Tobacco Use   Smoking status: Every Day    Current packs/day: 1.00    Average packs/day: 1 pack/day for 37.0 years (37.0 ttl pk-yrs)    Types: Cigarettes    Passive exposure: Current   Smokeless tobacco: Former   Tobacco comments:    Trying to quit, has nicotine  patches at home which have helped him quit before.  Smokes a little less than a pack a day.  Updated 07/13/2023.  Vaping Use   Vaping status: Never Used  Substance and Sexual Activity   Alcohol use: Not Currently   Drug use: Yes    Types: Marijuana    Comment: occasional   Sexual activity: Not on file  Other Topics Concern   Not on file  Social History Narrative   Not on file   Social Drivers of Health   Financial Resource Strain: High Risk (11/24/2022)   Overall Financial Resource Strain (CARDIA)    Difficulty of Paying Living Expenses: Hard  Food Insecurity: Food Insecurity Present (11/24/2022)   Hunger Vital Sign    Worried About Running Out of Food in the Last Year: Often true    Ran Out of Food in the Last Year: Often true  Transportation Needs: No Transportation Needs (05/12/2024)   PRAPARE - Administrator, Civil Service (Medical): No    Lack of Transportation (Non-Medical): No  Physical Activity: Not on file  Stress: Not on file  Social Connections: Not on file    Subjective: Review of Systems   Constitutional:  Negative for chills and fever.  HENT:  Negative for congestion and hearing loss.   Eyes:  Negative for blurred vision and double vision.  Respiratory:  Negative for cough and shortness of breath.   Cardiovascular:  Negative for chest pain and palpitations.  Gastrointestinal:  Positive for heartburn. Negative for abdominal pain, blood in stool, constipation, diarrhea, melena and vomiting.       Dysphagia  Genitourinary:  Negative for dysuria and urgency.  Musculoskeletal:  Negative for joint pain and myalgias.  Skin:  Negative for itching and rash.  Neurological:  Negative for dizziness and headaches.  Psychiatric/Behavioral:  Negative for depression. The patient is not nervous/anxious.      Objective: BP (!) 128/93 (BP Location: Left Arm, Patient Position: Sitting, Cuff Size: Large)   Pulse 75   Temp (!) 97.1 F (36.2 C) (Temporal)   Ht 5' 7 (1.702 m)   Wt 210 lb 8 oz (95.5 kg)   BMI 32.97 kg/m  Physical Exam Constitutional:      Appearance: Normal appearance.  HENT:     Head: Normocephalic and atraumatic.  Eyes:     Extraocular Movements: Extraocular movements intact.     Conjunctiva/sclera: Conjunctivae normal.  Cardiovascular:     Rate and Rhythm: Normal rate and regular rhythm.  Pulmonary:     Effort: Pulmonary effort is normal.     Breath sounds: Normal breath sounds.  Abdominal:     General: Bowel sounds are normal.     Palpations: Abdomen is soft.  Musculoskeletal:        General: Normal range of motion.     Cervical back: Normal range of motion and neck supple.  Skin:    General: Skin is warm.  Neurological:     General:  No focal deficit present.     Mental Status: He is alert and oriented to person, place, and time.  Psychiatric:        Mood and Affect: Mood normal.        Behavior: Behavior normal.      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  Recommended repeat EGD with dilation.  I then further discussed with Dr. Shaaron who is his primary gastroenterologist who is in agreement.  We will set up for EGD with dilation, ASA 3. The risks including infection, bleed, or perforation as well as benefits, limitations, alternatives and imponderables have been reviewed with the patient. Potential for esophageal dilation, biopsy, etc. have also been reviewed.  Questions have been answered. All parties agreeable.  Continue on pantoprazole  twice daily.  GERD well-controlled.  Hemorrhoid disease well-controlled on Anusol  as needed.  06/08/2024 10:05 AM   Disclaimer: This note was dictated with voice recognition software. Similar sounding words can inadvertently be transcribed and may not be corrected upon review.

## 2024-06-08 NOTE — Telephone Encounter (Signed)
 Called pt, no answer. He left before scheduling. Needs EGD/DIL with Dr. Shaaron, asa 3.

## 2024-06-08 NOTE — H&P (View-Only) (Signed)
 Referring Provider: Celestina Czar, MD Primary Care Physician:  Celestina Czar, MD Primary GI:  Dr. Cindie  Chief Complaint  Patient presents with   Dysphagia    Patient here today due to having issues with swallowing foods and liquids at times. Patient says this has been ongoing for the last several months. Patient is taking Protonix  40 mg bid, occasionally will take a third pill when eating spicy foods.  Patient says he has had issues lately with constipation and has been using stool softeners prn.     HPI:   [name] is a 58 y.o. male who presents to clinic today for follow-up visit.  Patient has significant history of colectomy with end colostomy (later reversed) anal fissure, acute pancreatitis, chronic abdominal pain, diverticulitis with perforation, GERD, Hiatal hernia, HTN, sleep apnea, and polysubstance abuse.   Esophageal dysphagia: Main complaint for me today is ongoing esophageal dysphagia.  Feels as though solids get stuck in his substernal region.  No melena hematochezia.  No chest pain, some epigastric discomfort.  Has had new EGDs in the past with dilation for Schatzki's ring.  Most recent EGD 12/18/2022 which he states helped his symptoms until previous 1 to 2 months.  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  Patient had CT of his chest 08/07/2023 which was largely unremarkable.  Chronic GERD: well controlled on pantoprazole  40 mg twice daily.  History of hemorrhoid disease: well controlled on Anusol  cream as needed.    Chronic periumbilical pain: There is a small area poking under his skin which causes his pain.  He had an umbilical hernia repair with mesh in 2008 with Dr. DeMason.  Since that time he has had multiple abdominal surgeries.  He states that he had diverticulitis while he was in prison.  He was scheduled to undergo a colonoscopy but subsequently developed a perforation from his  diverticulitis.  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  They attempted to reverse this ostomy in 2011, but he required more bowel resection, and was given an ileostomy.  He had his ileostomy reversed in 2013.  He also has an history of an appendectomy.   Met with Dr. Evonnie 12/23/2022 who felt given his multiple prior abdominal surgeries he is high risk for complication including bowel injury.  Recommended surveillance.  Past Medical History:  Diagnosis Date   Acute pancreatitis    Anxiety    Arthritis    Colonic adenoma    COPD (chronic obstructive pulmonary disease) (HCC)    Depression    Diverticulitis of colon with perforation 08/2010   GERD (gastroesophageal reflux disease)    History of hiatal hernia    Hypertension    Polysubstance abuse (HCC)    Renal insufficiency    Sleep apnea    Intolerant of CPAP   Stroke (HCC)    TIA (transient ischemic attack)     Past Surgical History:  Procedure Laterality Date   APPENDECTOMY     BALLOON DILATION N/A 08/04/2022   Procedure: BALLOON DILATION;  Surgeon: Cindie Carlin POUR, DO;  [facility];  Service: Endoscopy;  Laterality: N/A;   BIOPSY  12/14/2017   Procedure: BIOPSY;  Surgeon: Shaaron [name], MD;  [facility];  Service: Endoscopy;;  gastric    BIOPSY  10/13/2019   Procedure: BIOPSY;  Surgeon: Shaaron [name], MD;  [facility];  Service: Endoscopy;;  esophagus  BIOPSY  05/20/2021   Procedure: BIOPSY;  Surgeon: Shaaron [name], MD;  [facility];  Service: Endoscopy;;   BIOPSY  12/18/2022   Procedure: BIOPSY;  Surgeon: Shaaron [name], MD;  [facility];  Service: Endoscopy;;   clamp left in colon after abd surgery     COLON SURGERY  05/2011   [facility]. exp laparotomy with colostomy takedown, lysis of adhesions (3hours), loop ileostomy. complicated by abscess formation requiring percutaneous drainage   colonoscopy via stoma with endoscopy of  [place]  04/2011   [facility]. two sessile polyps in trv colon. complete resection but only partial retrieval. congested mucosa in area 3cm proximal to stoma, erythematous mucosa in Hartmann pouch. repeat tcs 8/2017path-adenomatous polyp   COLONOSCOPY WITH PROPOFOL  N/A 12/14/2017   Dr. Shaaron: 2 4 to 6 mm polyps in the cecum, tubular adenomas, single rectal polyp tubular adenoma, nonbleeding internal hemorrhoids.  Appears to be a surgical clip at the appendiceal orifice.  Next colonoscopy in 5 years   COLONOSCOPY WITH PROPOFOL  N/A 12/18/2022   Procedure: COLONOSCOPY WITH PROPOFOL ;  Surgeon: Shaaron [name], MD;  [facility];  Service: Endoscopy;  Laterality: N/A;  8:45 am   ESOPHAGEAL MANOMETRY N/A 12/11/2021   Procedure: ESOPHAGEAL MANOMETRY (EM);  Surgeon: Shila Gustav GAILS, MD;  [facility]: [facility];  Service: Endoscopy;  Laterality: N/A;   ESOPHAGOGASTRODUODENOSCOPY  03/05/2012   erosive reflux esophagitis, bulbar erosions, gastric erosions/petichae, bx showed gastritis, no H.Pylori. Procedure: ESOPHAGOGASTRODUODENOSCOPY (EGD);  Surgeon: [name] Shaaron, MD;  [facility];  Service: Endoscopy;  Laterality: N/A;  11:30   ESOPHAGOGASTRODUODENOSCOPY (EGD) WITH PROPOFOL  N/A 12/14/2017   Dr. Shaaron: Erosive reflux esophagitis with questionable mild stricturing/non-obstruction at this level status post dilation.  multiple gastric erosions in the stomach biopsy showed reactive gastropathy but no H. pylori.   ESOPHAGOGASTRODUODENOSCOPY (EGD) WITH PROPOFOL  N/A 10/13/2019   negative Barrett's, normal stomach   ESOPHAGOGASTRODUODENOSCOPY (EGD) WITH PROPOFOL  N/A 05/20/2021   inlet patch with superimposed nodule s/p biopsy. Normal esophagus s/p dilation. Normal gastric mucosa. Normal duodenum. Reflux changes on path.   ESOPHAGOGASTRODUODENOSCOPY (EGD) WITH PROPOFOL  N/A 08/04/2022   Procedure: ESOPHAGOGASTRODUODENOSCOPY (EGD) WITH PROPOFOL ;  Surgeon: Cindie Carlin POUR, DO;  [facility];  Service: Endoscopy;  Laterality: N/A;  1:15 pm   ESOPHAGOGASTRODUODENOSCOPY (EGD) WITH PROPOFOL  N/A 12/18/2022   Procedure: ESOPHAGOGASTRODUODENOSCOPY (EGD) WITH PROPOFOL ;  Surgeon: Shaaron [name], MD;  [facility];  Service: Endoscopy;  Laterality: N/A;   FOOT SURGERY Left    strethching of ligaments   HERNIA REPAIR     umbilical   KNEE SURGERY Right 2006   MALONEY DILATION N/A 12/14/2017   Procedure: AGAPITO DILATION;  Surgeon: Shaaron [name], MD;  [facility];  Service: Endoscopy;  Laterality: N/A;   MALONEY DILATION  05/20/2021   Procedure: AGAPITO DILATION;  Surgeon: Shaaron [name], MD;  [facility];  Service: Endoscopy;;   MALONEY DILATION N/A 12/18/2022   Procedure: AGAPITO HODGKIN;  Surgeon: Shaaron [name], MD;  [facility];  Service: Endoscopy;  Laterality: N/A;   MULTIPLE EXTRACTIONS WITH ALVEOLOPLASTY  02/23/2012   Procedure: MULTIPLE EXTRACION WITH ALVEOLOPLASTY;  Surgeon: Glendia HERO Primrose, DDS;  Location: [facility];  Service: Oral Surgery;  Laterality: Bilateral;  Removal of left mandibular torus, multiple extractions with alveoloplasty   POLYPECTOMY  12/14/2017   Procedure: POLYPECTOMY;  Surgeon: Shaaron [name], MD;  [facility];  Service: Endoscopy;;  POLYPECTOMY  12/18/2022   Procedure: POLYPECTOMY;  Surgeon: Shaaron [name], MD;  [facility];  Service: Endoscopy;;   reversal of colostomy  10/2011   per patient, did not have report   right knee surgery     sigmoid colectomy with end colostomy, Hartmann's pouch  08/2010   [facility]. complicated diverticulitis   UMBILICAL HERNIA REPAIR      Current Outpatient Medications  Medication Sig Dispense Refill   albuterol  (VENTOLIN  HFA) 108 (90 Base) MCG/ACT inhaler Inhale 1 puff into the lungs every 4 (four) hours as needed for wheezing or shortness of breath. 18 each 2   Blood Pressure Monitor DEVI Use as directed. 1 each 0   Buprenorphine   HCl-Naloxone  HCl (SUBOXONE ) 8-2 MG FILM PLACE 1 STRIPS UNDER THE TONGUE TWICE DAILY 60 Film 0   cycloSPORINE (RESTASIS) 0.05 % ophthalmic emulsion Place 1 drop into both eyes 2 (two) times daily.     fluticasone  (FLONASE ) 50 MCG/ACT nasal spray SPRAY 2 SPRAYS INTO EACH NOSTRIL EVERY DAY (Patient taking differently: 2 sprays as needed.) 48 mL 1   hydrocortisone  (ANUSOL -HC) 2.5 % rectal cream Place 1 Application rectally 2 (two) times daily as needed for hemorrhoids. 28 g 2   hydrOXYzine  (ATARAX ) 10 MG tablet Take 1 tablet (10 mg total) by mouth 3 (three) times daily as needed. 30 tablet 0   inclisiran (LEQVIO ) 284 MG/1.5ML SOSY injection Inject 284 mg into the skin once.     isosorbide  mononitrate (IMDUR ) 30 MG 24 hr tablet Take 30 mg by mouth 2 (two) times daily.     pantoprazole  (PROTONIX ) 40 MG tablet TAKE 1 TABLET BY MOUTH TWICE A DAY 60 tablet 2   pregabalin  (LYRICA ) 75 MG capsule Take 1 capsule (75 mg total) by mouth at bedtime for 7 days, THEN 1 capsule (75 mg total) 2 (two) times daily for 21 days. 49 capsule 0   spironolactone  (ALDACTONE ) 25 MG tablet Take 1 tablet (25 mg total) by mouth daily. 30 tablet 3   Current Facility-Administered Medications  Medication Dose Route Frequency Provider Last Rate Last Admin   triamcinolone  acetonide (KENALOG -40) injection 20 mg  20 mg Other Once Janit Thresa HERO, DPM        Allergies as of 06/08/2024 - Review Complete 06/08/2024  Allergen Reaction Noted   Aspirin  Other (See Comments) 07/05/2023   Codeine Itching and Nausea And Vomiting    Latex Other (See Comments) 03/09/2012   Morphine Hives 09/14/2021   Statins Other (See Comments) 01/01/2024   Zestril [lisinopril]  05/27/2017   Zetia  [ezetimibe ] Other (See Comments) 01/01/2024   Adhesive [tape] Rash 03/08/2012    Family History  Problem Relation Age of Onset   Stomach cancer Father    Lung cancer Father    Throat cancer Father    Cancer Father        lung and throat cancer    Hypertension Father    Liver disease Cousin        cirrhosis, etoh   Throat cancer Paternal Uncle    Lung cancer Paternal Uncle    Colon cancer Cousin 52   Heart disease Mother    Hypertension Mother    Colon cancer Maternal Uncle    Throat cancer Paternal Uncle    Lung cancer Paternal Uncle    Throat cancer Paternal Uncle    Lung cancer Paternal Uncle     Social History   Socioeconomic History   Marital status: Single    Spouse name:  Not on file   Number of children: 0   Years of education: Not on file   Highest education level: Not on file  Occupational History   Not on file  Tobacco Use   Smoking status: Every Day    Current packs/day: 1.00    Average packs/day: 1 pack/day for 37.0 years (37.0 ttl pk-yrs)    Types: Cigarettes    Passive exposure: Current   Smokeless tobacco: Former   Tobacco comments:    Trying to quit, has nicotine  patches at home which have helped him quit before.  Smokes a little less than a pack a day.  Updated 07/13/2023.  Vaping Use   Vaping status: Never Used  Substance and Sexual Activity   Alcohol use: Not Currently   Drug use: Yes    Types: Marijuana    Comment: occasional   Sexual activity: Not on file  Other Topics Concern   Not on file  Social History Narrative   Not on file   Social Drivers of Health   Financial Resource Strain: High Risk (11/24/2022)   Overall Financial Resource Strain (CARDIA)    Difficulty of Paying Living Expenses: Hard  Food Insecurity: Food Insecurity Present (11/24/2022)   Hunger Vital Sign    Worried About Running Out of Food in the Last Year: Often true    Ran Out of Food in the Last Year: Often true  Transportation Needs: No Transportation Needs (05/12/2024)   PRAPARE - Administrator, Civil Service (Medical): No    Lack of Transportation (Non-Medical): No  Physical Activity: Not on file  Stress: Not on file  Social Connections: Not on file    Subjective: Review of Systems   Constitutional:  Negative for chills and fever.  HENT:  Negative for congestion and hearing loss.   Eyes:  Negative for blurred vision and double vision.  Respiratory:  Negative for cough and shortness of breath.   Cardiovascular:  Negative for chest pain and palpitations.  Gastrointestinal:  Positive for heartburn. Negative for abdominal pain, blood in stool, constipation, diarrhea, melena and vomiting.       Dysphagia  Genitourinary:  Negative for dysuria and urgency.  Musculoskeletal:  Negative for joint pain and myalgias.  Skin:  Negative for itching and rash.  Neurological:  Negative for dizziness and headaches.  Psychiatric/Behavioral:  Negative for depression. The patient is not nervous/anxious.      Objective: BP (!) 128/93 (BP Location: Left Arm, Patient Position: Sitting, Cuff Size: Large)   Pulse 75   Temp (!) 97.1 F (36.2 C) (Temporal)   Ht 5' 7 (1.702 m)   Wt 210 lb 8 oz (95.5 kg)   BMI 32.97 kg/m  Physical Exam Constitutional:      Appearance: Normal appearance.  HENT:     Head: Normocephalic and atraumatic.  Eyes:     Extraocular Movements: Extraocular movements intact.     Conjunctiva/sclera: Conjunctivae normal.  Cardiovascular:     Rate and Rhythm: Normal rate and regular rhythm.  Pulmonary:     Effort: Pulmonary effort is normal.     Breath sounds: Normal breath sounds.  Abdominal:     General: Bowel sounds are normal.     Palpations: Abdomen is soft.  Musculoskeletal:        General: Normal range of motion.     Cervical back: Normal range of motion and neck supple.  Skin:    General: Skin is warm.  Neurological:     General:  No focal deficit present.     Mental Status: He is alert and oriented to person, place, and time.  Psychiatric:        Mood and Affect: Mood normal.        Behavior: Behavior normal.      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  Recommended repeat EGD with dilation.  I then further discussed with Dr. Shaaron who is his primary gastroenterologist who is in agreement.  We will set up for EGD with dilation, ASA 3. The risks including infection, bleed, or perforation as well as benefits, limitations, alternatives and imponderables have been reviewed with the patient. Potential for esophageal dilation, biopsy, etc. have also been reviewed.  Questions have been answered. All parties agreeable.  Continue on pantoprazole  twice daily.  GERD well-controlled.  Hemorrhoid disease well-controlled on Anusol  as needed.  06/08/2024 10:05 AM   Disclaimer: This note was dictated with voice recognition software. Similar sounding words can inadvertently be transcribed and may not be corrected upon review.

## 2024-06-08 NOTE — Patient Instructions (Signed)
 We will schedule you for upper endoscopy with esophageal dilation to further evaluate your difficulty swallowing.  Continue on pantoprazole  twice daily.  It was nice seeing you again today.  Dr. Cindie

## 2024-06-09 NOTE — Telephone Encounter (Signed)
 PA pending via [facility] [id number]

## 2024-06-13 ENCOUNTER — Encounter: Payer: Self-pay | Admitting: Student

## 2024-06-13 ENCOUNTER — Ambulatory Visit: Admitting: Student

## 2024-06-13 VITALS — BP 120/82 | HR 72 | Temp 97.7°F | Ht 67.0 in | Wt 212.8 lb

## 2024-06-13 DIAGNOSIS — I10 Essential (primary) hypertension: Secondary | ICD-10-CM | POA: Diagnosis not present

## 2024-06-13 DIAGNOSIS — M316 Other giant cell arteritis: Secondary | ICD-10-CM | POA: Diagnosis not present

## 2024-06-13 DIAGNOSIS — Z79899 Other long term (current) drug therapy: Secondary | ICD-10-CM | POA: Diagnosis not present

## 2024-06-13 DIAGNOSIS — Z8669 Personal history of other diseases of the nervous system and sense organs: Secondary | ICD-10-CM | POA: Diagnosis not present

## 2024-06-13 DIAGNOSIS — Z8673 Personal history of transient ischemic attack (TIA), and cerebral infarction without residual deficits: Secondary | ICD-10-CM | POA: Diagnosis not present

## 2024-06-13 DIAGNOSIS — Z8249 Family history of ischemic heart disease and other diseases of the circulatory system: Secondary | ICD-10-CM

## 2024-06-13 DIAGNOSIS — G8929 Other chronic pain: Secondary | ICD-10-CM

## 2024-06-13 MED ORDER — PREGABALIN 75 MG PO CAPS: 75.0000 mg | ORAL_CAPSULE | Freq: Two times a day (BID) | ORAL | 9 refills | Status: DC

## 2024-06-13 MED ORDER — PREDNISONE 20 MG PO TABS: 60.0000 mg | ORAL_TABLET | Freq: Every day | ORAL | 2 refills | Status: AC

## 2024-06-13 NOTE — Assessment & Plan Note (Signed)
 Patient has a past medical history of hypertension.  His medications at this time include spironolactone  25 mg daily and Imdur  30 mg twice daily.  Blood pressure today is 120/82.  This is well-controlled at this time.  Plan: - Continue spironolactone  25 mg daily - Continue Imdur  30 mg daily - Patient has had elevated creatinine at previous visit with cardiologist.  Has not repeated BMP.  Will repeat.

## 2024-06-13 NOTE — Progress Notes (Signed)
 CC: Left eye swelling and left facial weakness  HPI:  [name] is a 58 y.o. male with past medical history of hypertension, COPD, lung nodules, GERD who presents for acute visit concerning left eye swelling and left facial weakness.  Please see assessment and plan for full HPI.  Medications: Allergic rhinitis: Flonase  2 sprays daily COPD: Albuterol  1 puff every 4 hours as needed GERD: Protonix  40 mg twice daily Hemorrhoids: Anusol  cream OUD: Suboxone  8-2 mg twice daily Chronic pain: Lyrica  75 mg twice daily Anxiety: Hydroxyzine  10 mg 3 times daily. Hyperlipidemia: Leqvio  Hypertension: Spironolactone  25 mg daily, Imdur  30 mg twice daily GCA: Prednisone  60 mg daily   Past Medical History:  Diagnosis Date   Acute pancreatitis    Anxiety    Arthritis    Colonic adenoma    COPD (chronic obstructive pulmonary disease) (HCC)    Depression    Diverticulitis of colon with perforation 08/2010   GERD (gastroesophageal reflux disease)    History of hiatal hernia    Hypertension    Polysubstance abuse (HCC)    Renal insufficiency    Sleep apnea    Intolerant of CPAP   Stroke (HCC)    TIA (transient ischemic attack)      Current Outpatient Medications:    predniSONE  (DELTASONE ) 20 MG tablet, Take 3 tablets (60 mg total) by mouth daily with breakfast for 14 days., Disp: 42 tablet, Rfl: 2   albuterol  (VENTOLIN  HFA) 108 (90 Base) MCG/ACT inhaler, Inhale 1 puff into the lungs every 4 (four) hours as needed for wheezing or shortness of breath., Disp: 18 each, Rfl: 2   Blood Pressure Monitor DEVI, Use as directed., Disp: 1 each, Rfl: 0   Buprenorphine  HCl-Naloxone  HCl (SUBOXONE ) 8-2 MG FILM, PLACE 1 STRIPS UNDER THE TONGUE TWICE DAILY, Disp: 60 Film, Rfl: 0   cycloSPORINE (RESTASIS) 0.05 % ophthalmic emulsion, Place 1 drop into both eyes 2 (two) times daily., Disp: , Rfl:    fluticasone  (FLONASE ) 50 MCG/ACT nasal spray, SPRAY 2 SPRAYS INTO EACH NOSTRIL EVERY DAY (Patient  taking differently: 2 sprays as needed.), Disp: 48 mL, Rfl: 1   hydrocortisone  (ANUSOL -HC) 2.5 % rectal cream, Place 1 Application rectally 2 (two) times daily as needed for hemorrhoids., Disp: 28 g, Rfl: 2   hydrOXYzine  (ATARAX ) 10 MG tablet, Take 1 tablet (10 mg total) by mouth 3 (three) times daily as needed., Disp: 30 tablet, Rfl: 0   inclisiran (LEQVIO ) 284 MG/1.5ML SOSY injection, Inject 284 mg into the skin once., Disp: , Rfl:    isosorbide  mononitrate (IMDUR ) 30 MG 24 hr tablet, Take 30 mg by mouth 2 (two) times daily., Disp: , Rfl:    pantoprazole  (PROTONIX ) 40 MG tablet, TAKE 1 TABLET BY MOUTH TWICE A DAY, Disp: 60 tablet, Rfl: 2   pregabalin  (LYRICA ) 75 MG capsule, Take 1 capsule (75 mg total) by mouth at bedtime for 7 days, THEN 1 capsule (75 mg total) 2 (two) times daily for 21 days., Disp: 49 capsule, Rfl: 0   spironolactone  (ALDACTONE ) 25 MG tablet, Take 1 tablet (25 mg total) by mouth daily., Disp: 30 tablet, Rfl: 3  Current Facility-Administered Medications:    triamcinolone  acetonide (KENALOG -40) injection 20 mg, 20 mg, Other, Once, Janit, Thresa HERO, DPM  Review of Systems:    Eye: Patient endorses left eye vision loss Neuro: Patient endorses left arm and left lower extremity weakness.  He endorses left facial weakness.  He endorses left eye vision loss  Physical Exam:  Vitals:   06/13/24 1459  BP: 120/82  Pulse: 72  Temp: 97.7 F (36.5 C)  TempSrc: Oral  SpO2: 95%  Weight: 212 lb 12.8 oz (96.5 kg)  Height: 5' 7 (1.702 m)   General: Patient is sitting comfortably in the room  Head: Temporal artery tenderness appreciated Eyes: EOMI without pain, PERRLA, left eye swelling appreciated.  No conjunctival erythema appreciated. Cardio: Regular rate and rhythm, no murmurs, rubs or gallops Pulmonary: Clear to ausculation bilaterally with no rales, rhonchi, and crackles  Neuro: Left lower extremity with 4/5 strength noted on knee flexion, knee extension, hip flexion, hip  extension, foot dorsiflexion, and foot plantarflexion.  No sensation loss.  Right lower extremity with no obvious strength deficits.  Left upper extremity with 4/5 strength noted to left shoulder abduction, shoulder flexion, bicep flexion, and triceps extension.  No obvious sensation deficits.   Assessment & Plan:   Assessment & Plan Primary hypertension Patient has a past medical history of hypertension.  His medications at this time include spironolactone  25 mg daily and Imdur  30 mg twice daily.  Blood pressure today is 120/82.  This is well-controlled at this time.  Plan: - Continue spironolactone  25 mg daily - Continue Imdur  30 mg daily - Patient has had elevated creatinine at previous visit with cardiologist.  Has not repeated BMP.  Will repeat. GCA (giant cell arteritis) (HCC) Patient has a past medical history of Bell's palsy.  He has previously been treated with prednisone .  Today, he presents with concerns of left eye pain and swelling over the past 3 weeks.  He was seen in the emergency department for concerns for these symptoms, and patient was evaluated with MRI which was negative for acute stroke.  Previously he is on the symptoms and treated with prednisone .  This time, patient does not have resolution of symptoms.  Patient had vision loss.  He has temporal artery tenderness.  I do have concern for GCA.  Will urgently refer to rheumatology.  Will start high-dose steroids with prednisone  60 mg daily.  Will likely need to taper.  Plan: - Patient encouraged to reach out to his eye doctor - Patient referred to ophthalmology - Patient referred to rheumatology urgently - Start prednisone  60 mg daily - Follow-up in 2 weeks - Obtain ESR and CRP Other chronic pain Patient has a history of chronic pain.  He has been on Suboxone  for this.  He states it is no longer working.  He was previously referred to our clinic director, Dr. Rosan, for Suboxone  management, but he was ultimately  scheduled with me.  Given there have been more pressing issues at this visit, had to asked patient to address this at a different visit.  Instructed our front staff to schedule patient visit with Dr. Rosan for his chronic pain  Plan: - Refill Lyrica  75 mg twice daily - Continue Suboxone  8-2 mg twice daily  Patient discussed with Dr. Shawn Libby Blanch, DO Internal Medicine Resident PGY-3

## 2024-06-13 NOTE — Patient Instructions (Addendum)
 [name],Thank you for allowing me to take part in your care today.  Here are your instructions.  1.  I am checking labs today.  I will call you with results.  2.  I have started you on prednisone .  Please take as directed.  Do not abruptly stop this medication.  3.  I have referred you to rheumatology I have also referred you to ophthalmology   PLEASE BRING YOUR MEDICATIONS TO EVERY APPOINTMENT  Thank you, Dr. Tobie  If you have any other questions please contact the internal medicine clinic at [phone] If it is after hours, please call the [facility] at [phone] and then ask the person who picks up for the resident on call.

## 2024-06-13 NOTE — Telephone Encounter (Signed)
 PA approved [id number] DOS: 06/22/2024-09/20/2024

## 2024-06-14 ENCOUNTER — Ambulatory Visit: Payer: Self-pay | Admitting: Student

## 2024-06-14 LAB — BASIC METABOLIC PANEL WITH GFR
BUN/Creatinine Ratio: 14 (ref 9–20)
BUN: 16 mg/dL (ref 6–24)
CO2: 22 mmol/L (ref 20–29)
Calcium: 9 mg/dL (ref 8.7–10.2)
Chloride: 103 mmol/L (ref 96–106)
Creatinine, Ser: 1.17 mg/dL (ref 0.76–1.27)
Glucose: 107 mg/dL — ABNORMAL HIGH (ref 70–99)
Potassium: 4.2 mmol/L (ref 3.5–5.2)
Sodium: 139 mmol/L (ref 134–144)
eGFR: 72 mL/min/1.73

## 2024-06-14 LAB — MAGNESIUM: Magnesium: 1.9 mg/dL (ref 1.6–2.3)

## 2024-06-14 LAB — SEDIMENTATION RATE: Sed Rate: 41 mm/h — ABNORMAL HIGH (ref 0–30)

## 2024-06-14 LAB — C-REACTIVE PROTEIN: CRP: 15 mg/L — ABNORMAL HIGH (ref 0–10)

## 2024-06-14 NOTE — Progress Notes (Signed)
 Internal Medicine Clinic Attending  I was physically present during the key portions of the resident provided service and participated in the medical decision making of patient's management care. I reviewed pertinent patient test results.  The assessment, diagnosis, and plan were formulated together and I agree with the documentation in the resident's note.  Pt presenting with blurry L>R vision, L-sided facial weakness, temporal artery tenderness, headache and reports of jaw claudication. Patient has had presentations similar to these in the past that were attributed to Bell's Palsy and improved with steroid therapy. He similarly completed a course of steroids for recurrent Bells Palsy however without significant relief. Given symptoms of jaw claudication and visual disturbances, will preemptively treat for potential GCA. ESR/CRP elevated on lab testing. Will refer for urgent Rheumatology/Ophtho appts.  Shawn Sick, MD

## 2024-06-15 ENCOUNTER — Encounter: Payer: Self-pay | Admitting: Internal Medicine

## 2024-06-15 ENCOUNTER — Ambulatory Visit: Payer: Self-pay

## 2024-06-15 ENCOUNTER — Other Ambulatory Visit: Payer: Self-pay

## 2024-06-15 ENCOUNTER — Emergency Department (HOSPITAL_COMMUNITY)

## 2024-06-15 ENCOUNTER — Encounter (HOSPITAL_COMMUNITY): Payer: Self-pay

## 2024-06-15 ENCOUNTER — Emergency Department (HOSPITAL_COMMUNITY)
Admission: EM | Admit: 2024-06-15 | Discharge: 2024-06-15 | Disposition: A | Discharge status: Home / Self Care | Source: Ambulatory Visit | Attending: Emergency Medicine | Admitting: Emergency Medicine

## 2024-06-15 DIAGNOSIS — R519 Headache, unspecified: Secondary | ICD-10-CM | POA: Diagnosis present

## 2024-06-15 DIAGNOSIS — D72829 Elevated white blood cell count, unspecified: Secondary | ICD-10-CM | POA: Diagnosis not present

## 2024-06-15 DIAGNOSIS — Z91040 Latex allergy status: Secondary | ICD-10-CM | POA: Diagnosis not present

## 2024-06-15 LAB — COMPREHENSIVE METABOLIC PANEL WITH GFR
ALT: 19 U/L (ref 0–44)
AST: 18 U/L (ref 15–41)
Albumin: 4.2 g/dL (ref 3.5–5.0)
Alkaline Phosphatase: 77 U/L (ref 38–126)
Anion gap: 12 (ref 5–15)
BUN: 25 mg/dL — ABNORMAL HIGH (ref 6–20)
CO2: 24 mmol/L (ref 22–32)
Calcium: 9.4 mg/dL (ref 8.9–10.3)
Chloride: 104 mmol/L (ref 98–111)
Creatinine, Ser: 1.31 mg/dL — ABNORMAL HIGH (ref 0.61–1.24)
GFR, Estimated: >60 mL/min (ref >60)
Glucose, Bld: 149 mg/dL — ABNORMAL HIGH (ref 70–99)
Potassium: 4.3 mmol/L (ref 3.5–5.1)
Sodium: 140 mmol/L (ref 135–145)
Total Bilirubin: 0.2 mg/dL (ref 0.0–1.2)
Total Protein: 7.6 g/dL (ref 6.5–8.1)

## 2024-06-15 LAB — CBC WITH DIFFERENTIAL/PLATELET
Abs Immature Granulocytes: 0.1 K/uL — ABNORMAL HIGH (ref 0.00–0.07)
Basophils Absolute: 0 K/uL (ref 0.0–0.1)
Basophils Relative: 0 %
Eosinophils Absolute: 0 K/uL (ref 0.0–0.5)
Eosinophils Relative: 0 %
HCT: 46.2 % (ref 39.0–52.0)
Hemoglobin: 15.1 g/dL (ref 13.0–17.0)
Immature Granulocytes: 1 %
Lymphocytes Relative: 10 %
Lymphs Abs: 1.2 K/uL (ref 0.7–4.0)
MCH: 30.9 pg (ref 26.0–34.0)
MCHC: 32.7 g/dL (ref 30.0–36.0)
MCV: 94.7 fL (ref 80.0–100.0)
Monocytes Absolute: 0.2 K/uL (ref 0.1–1.0)
Monocytes Relative: 2 %
Neutro Abs: 10.7 K/uL — ABNORMAL HIGH (ref 1.7–7.7)
Neutrophils Relative %: 87 %
Platelets: 224 K/uL (ref 150–400)
RBC: 4.88 MIL/uL (ref 4.22–5.81)
RDW: 13.2 % (ref 11.5–15.5)
WBC: 12.3 K/uL — ABNORMAL HIGH (ref 4.0–10.5)
nRBC: 0 % (ref 0.0–0.2)

## 2024-06-15 MED ORDER — SODIUM CHLORIDE 0.9 % IV BOLUS
1000.0000 mL | Freq: Once | INTRAVENOUS | Status: AC
  Administered 2024-06-15: 1000 mL via INTRAVENOUS

## 2024-06-15 MED ORDER — HYDROMORPHONE HCL 1 MG/ML IJ SOLN
1.0000 mg | Freq: Once | INTRAMUSCULAR | Status: AC
  Administered 2024-06-15: 1 mg via INTRAVENOUS
  Filled 2024-06-15: qty 1

## 2024-06-15 MED ORDER — OXYCODONE-ACETAMINOPHEN 5-325 MG PO TABS: 1.0000 | ORAL_TABLET | Freq: Four times a day (QID) | ORAL | 0 refills | Status: DC | PRN

## 2024-06-15 MED ORDER — GADOBUTROL 1 MMOL/ML IV SOLN
10.0000 mL | Freq: Once | INTRAVENOUS | Status: AC | PRN
  Administered 2024-06-15: 10 mL via INTRAVENOUS

## 2024-06-15 MED ORDER — KETOROLAC TROMETHAMINE 15 MG/ML IJ SOLN
15.0000 mg | Freq: Once | INTRAMUSCULAR | Status: AC
  Administered 2024-06-15: 15 mg via INTRAVENOUS
  Filled 2024-06-15: qty 1

## 2024-06-15 MED ORDER — PROCHLORPERAZINE EDISYLATE 10 MG/2ML IJ SOLN
10.0000 mg | Freq: Once | INTRAMUSCULAR | Status: AC
  Administered 2024-06-15: 10 mg via INTRAVENOUS
  Filled 2024-06-15: qty 2

## 2024-06-15 MED ORDER — DIPHENHYDRAMINE HCL 50 MG/ML IJ SOLN
25.0000 mg | Freq: Once | INTRAMUSCULAR | Status: AC
  Administered 2024-06-15: 25 mg via INTRAVENOUS
  Filled 2024-06-15: qty 1

## 2024-06-15 NOTE — ED Triage Notes (Signed)
 Pt arrived via POV c/o recurrent headache and pain around his left eye. Pt reports calling his PCP Office and being advised to go to the ER. Pt currently trying OTC Tylenol , ast dose at 0600 this morning, and Pt currently prescribed Lyrica  and Prednisolone w/o relief.

## 2024-06-15 NOTE — Telephone Encounter (Signed)
 FYI Only or Action Required?: FYI only for provider.  Patient was last seen in primary care on 06/13/2024 by Tobie Gaines, DO.  Called Nurse Triage reporting Headache, Facial Swelling, and Insomnia.  Symptoms began several days ago to several weeks ago.  Interventions attempted: Prescription medications: prednisone , Lyrica .  Symptoms are: mild intermittent left sided headache, left eye swollen moderate, lightheaded/dizziness, head doesn't feel right, insomnia, weakness in left arm/hand, bilateral hand numbness worse on left side gradually worsening.  Triage Disposition: Go to ED Now (or PCP Triage)  Patient/caregiver understands and will follow disposition?: Yes             Copied from CRM #[id number]. Topic: Clinical - Red Word Triage >> Jun 15, 2024  7:34 AM Cherylann RAMAN wrote: Red Word that prompted transfer to Nurse Triage: Patient called in with complaints of intermittent headaches, confusion, altered mental status (patient states that he is not right in the head), and left eye will not open. Patient took Tylenol  but it does not work. Reason for Disposition  Headache  Answer Assessment - Initial Assessment Questions Patient is alert and oriented x4. Patient states he is set up with rheumatology for January and his insurance did not approve him for ophthalmology til after next week.  1. LOCATION: Where does it hurt?      Left side around ear and eye.  2. ONSET: When did the headache start? (e.g., minutes, hours, days)      Yesterday, last night around 10:30 or 11pm. He states he has been having them on and off for the past couple of weeks.  3. PATTERN: Does the pain come and go, or has it been constant since it started?     Comes and goes.  4. SEVERITY: How bad is the pain? and What does it keep you from doing?  (e.g., Scale 1-10; mild, moderate, or severe)     Mild at this time. He states it kept him from sleeping last night. He states he was restless last night.  He states his left side of his face is tender to touch.  5. RECURRENT SYMPTOM: Have you ever had headaches before? If Yes, ask: When was the last time? and What happened that time?      Yes, was seen in office on Monday and   6. CAUSE: What do you think is causing the headache?     He states when he goes to the ED they tell him it is Bell's Palsy.  7. MIGRAINE: Have you been diagnosed with migraine headaches? If Yes, ask: Is this headache similar?      No.  8. HEAD INJURY: Has there been any recent injury to your head?      No.  9. OTHER SYMPTOMS: Do you have any other symptoms? (e.g., fever, stiff neck, eye pain, sore throat, cold symptoms)     Left eye swelling (can open and see out of it) x 3 weeks; insomnia (he thinks it could be from the prednisone  he has been on); numbness in both hands worse in the left (x years); weakness in left hand/arm (he states that has been going on since Monday when he was seen at PCP office); lightheaded spells, my head just don't feel right and feels cloudy. Denies changes in speech or vision, weakness or numbness in legs.  10. PREGNANCY: Is there any chance you are pregnant? When was your last menstrual period?       N/A.  Protocols used: Headache-A-AH, Neurologic Deficit-A-AH

## 2024-06-15 NOTE — ED Provider Notes (Signed)
 [place] EMERGENCY DEPARTMENT AT [facility] Provider Note   CSN: [id number] Arrival date & time: 06/15/24  9096     Patient presents with: Headache   [name] is a 58 y.o. male.   Patient is a 58 year old male who presents to the emergency department with a chief complaint of recurrent left-sided headache and pain to the surrounding structures of the left eye.  Patient notes that symptoms have been ongoing for at least the past month and have been becoming worse.  He was evaluated by his primary care doctor 2 days ago with concerns for possible giant cell arteritis and was placed on high-dose steroids with follow-up to be arranged with ophthalmology and rheumatology.  Patient denies any pain directly to the eye itself and denies any associated vision changes or pain with extraocular movements.  He does note that he was evaluated at another emergency department approximately a month ago and diagnosed with Bell's palsy but this is the second diagnosis of Bell's palsy within the past 6 months.  Patient denies any other associated numbness or paresthesias throughout.  He denies any unilateral weakness.  He has had no chest pain or shortness of breath.  He denies any recent falls or blunt head trauma.   Headache      Prior to Admission medications   Medication Sig Start Date End Date Taking? Authorizing Provider  albuterol  (VENTOLIN  HFA) 108 (90 Base) MCG/ACT inhaler Inhale 1 puff into the lungs every 4 (four) hours as needed for wheezing or shortness of breath. 05/24/24   Celestina Czar, MD  Blood Pressure Monitor DEVI Use as directed. 05/05/24   Miriam Norris, NP  Buprenorphine  HCl-Naloxone  HCl (SUBOXONE ) 8-2 MG FILM PLACE 1 STRIPS UNDER THE TONGUE TWICE DAILY 05/19/24   Harrie Bruckner, DO  cycloSPORINE (RESTASIS) 0.05 % ophthalmic emulsion Place 1 drop into both eyes 2 (two) times daily. 06/18/23   [provider]  fluticasone  (FLONASE ) 50 MCG/ACT nasal  spray SPRAY 2 SPRAYS INTO EACH NOSTRIL EVERY DAY Patient taking differently: 2 sprays as needed. 10/01/23   Koomson, Julius, MD  hydrocortisone  (ANUSOL -HC) 2.5 % rectal cream Place 1 Application rectally 2 (two) times daily as needed for hemorrhoids. 08/12/23   Rudy Josette RAMAN, PA-C  hydrOXYzine  (ATARAX ) 10 MG tablet Take 1 tablet (10 mg total) by mouth 3 (three) times daily as needed. 10/14/23   Tobie Gaines, DO  inclisiran (LEQVIO ) 284 MG/1.5ML SOSY injection Inject 284 mg into the skin once.    [provider]  isosorbide  mononitrate (IMDUR ) 30 MG 24 hr tablet Take 30 mg by mouth 2 (two) times daily.    [provider]  pantoprazole  (PROTONIX ) 40 MG tablet TAKE 1 TABLET BY MOUTH TWICE A DAY 04/12/24   Harper, Kristen S, PA-C  predniSONE  (DELTASONE ) 20 MG tablet Take 3 tablets (60 mg total) by mouth daily with breakfast for 14 days. 06/13/24 06/27/24  Tobie Gaines, DO  pregabalin  (LYRICA ) 75 MG capsule Take 1 capsule (75 mg total) by mouth 2 (two) times daily. 06/13/24   Tobie Gaines, DO  spironolactone  (ALDACTONE ) 25 MG tablet Take 1 tablet (25 mg total) by mouth daily. 01/01/24   Miriam Norris, NP    Allergies: Aspirin , Codeine, Latex, Morphine, Statins, Zestril [lisinopril], Zetia  [ezetimibe ], and Adhesive [tape]    Review of Systems  Neurological:  Positive for headaches.  All other systems reviewed and are negative.   Updated Vital Signs BP (!) 157/105 (BP Location: Right Arm)   Pulse 77  Temp 97.9 F (36.6 C) (Oral)   Resp 16   Ht 5' 7 (1.702 m)   Wt 96.5 kg   SpO2 96%   BMI 33.33 kg/m   Physical Exam Vitals and nursing note reviewed.  Constitutional:      General: He is not in acute distress.    Appearance: Normal appearance. He is not ill-appearing.  HENT:     Head: Normocephalic and atraumatic.     Nose: Nose normal.     Mouth/Throat:     Mouth: Mucous membranes are moist.  Eyes:     General: No visual field deficit or scleral icterus.    Extraocular  Movements: Extraocular movements intact.     Right eye: Normal extraocular motion and no nystagmus.     Left eye: Normal extraocular motion and no nystagmus.     Conjunctiva/sclera: Conjunctivae normal.     Pupils: Pupils are equal, round, and reactive to light. Pupils are equal.     Right eye: Pupil is round and reactive.     Left eye: Pupil is round and reactive.  Cardiovascular:     Rate and Rhythm: Normal rate and regular rhythm.     Pulses: Normal pulses.     Heart sounds: Normal heart sounds. No murmur heard.    No gallop.  Pulmonary:     Effort: Pulmonary effort is normal. No respiratory distress.     Breath sounds: Normal breath sounds. No stridor. No wheezing, rhonchi or rales.  Abdominal:     General: Abdomen is flat. Bowel sounds are normal. There is no distension.     Palpations: Abdomen is soft.     Tenderness: There is no abdominal tenderness.  Musculoskeletal:        General: No swelling or tenderness. Normal range of motion.     Cervical back: Normal range of motion and neck supple. No rigidity.  Lymphadenopathy:     Cervical: No cervical adenopathy.  Skin:    General: Skin is warm and dry.  Neurological:     General: No focal deficit present.     Mental Status: He is alert and oriented to person, place, and time. Mental status is at baseline.     Cranial Nerves: No dysarthria.     Motor: No weakness.     Coordination: Coordination normal.     Gait: Gait normal.     Comments: Difficulty with opening left eye, unable to wrinkle left side of forehead, mild drooping to the left side of the mouth  Psychiatric:        Mood and Affect: Mood normal. Mood is not anxious or depressed.        Behavior: Behavior normal. Behavior is not agitated.        Thought Content: Thought content normal.        Cognition and Memory: Cognition is not impaired. Memory is not impaired.        Judgment: Judgment normal.     (all labs ordered are listed, but only abnormal results are  displayed) Labs Reviewed  COMPREHENSIVE METABOLIC PANEL WITH GFR  CBC WITH DIFFERENTIAL/PLATELET    EKG: None  Radiology: No results found.   Procedures   Medications Ordered in the ED  prochlorperazine (COMPAZINE) injection 10 mg (has no administration in time range)  diphenhydrAMINE  (BENADRYL ) injection 25 mg (has no administration in time range)  ketorolac  (TORADOL ) 15 MG/ML injection 15 mg (has no administration in time range)  sodium chloride  0.9 % bolus 1,000 mL (  has no administration in time range)  HYDROmorphone  (DILAUDID ) injection 1 mg (has no administration in time range)                                    Medical Decision Making Amount and/or Complexity of Data Reviewed Labs: ordered. Radiology: ordered.  Risk Prescription drug management.   This patient presents to the ED for concern of headache, facial pain differential diagnosis includes migraine, CVA, TIA, glaucoma, optic neuritis, giant cell arteritis, trigeminal neuralgia, cluster headache    Additional history obtained:  Additional history obtained from medical records External records from outside source obtained and reviewed including medical records   Lab Tests:  I Ordered, and personally interpreted labs.  The pertinent results include: Mild leukocytosis, no anemia, creatinine at baseline, normal liver function, unremarkable electrolytes   Imaging Studies ordered:  I ordered imaging studies including MRI brain, MRV I independently visualized and interpreted imaging which showed no acute process visualized I agree with the radiologist interpretation   Medicines ordered and prescription drug management:  I ordered medication including Dilaudid , IV fluids, Toradol , Compazine, Benadryl  for headache Reevaluation of the patient after these medicines showed that the patient improved I have reviewed the patients home medicines and have made adjustments as needed   Problem List / ED  Course:  Patient is doing much better at this time and is stable for discharge home.  Discussed with patient that we will place ambulatory referral to neurology.  Suspect symptoms may be secondary to trigeminal neuralgia versus cluster migraines.  Patient has no concerning neurological deficits at this point.  Low suspicion for giant cell arteritis as pain is diffusely along the left side of the face and not localized to the left temporal region.  Patient has no associated visual changes and no pain directly to the eye and do not suspect acute angle glaucoma.  Do not suspect an optic neuritis at this time.  Patient is already being scheduled to follow-up with ophthalmology and rheumatology on an outpatient basis and is already on high-dose steroids.  Discussed the importance of close follow-up on an outpatient basis as well as strict turn precautions for any new or worsening symptoms.  Do not suspect admission is warranted at this time.  Patient voiced understanding and had no additional questions.  Patient was fully evaluated by attending physician who is in agreement to plan at this time.   Social Determinants of Health:  None        Final diagnoses:  None    ED Discharge Orders     None          Daralene Lonni JONETTA DEVONNA 06/15/24 1130    Suzette Pac, MD 06/16/24 1002

## 2024-06-15 NOTE — Discharge Instructions (Signed)
 Please follow-up closely with neurology on an outpatient basis.  Please do not take your Suboxone  while taking the Percocet.  Return to emergency department immediately for any new or worsening symptoms.

## 2024-06-15 NOTE — ED Notes (Signed)
 Patient transported to MRI

## 2024-06-15 NOTE — Telephone Encounter (Signed)
Per chart, pt is in the ER.

## 2024-06-16 ENCOUNTER — Encounter: Payer: Self-pay | Admitting: Internal Medicine

## 2024-06-17 ENCOUNTER — Encounter (HOSPITAL_COMMUNITY)
Admission: RE | Admit: 2024-06-17 | Discharge: 2024-06-17 | Disposition: A | Discharge status: Home / Self Care | Source: Ambulatory Visit | Attending: Internal Medicine | Admitting: Internal Medicine

## 2024-06-17 ENCOUNTER — Encounter (HOSPITAL_COMMUNITY): Payer: Self-pay

## 2024-06-17 NOTE — Telephone Encounter (Signed)
 Referral to be Rerouted to a new office.  Copied from CRM [id number]. Topic: Referral - Status >> Jun 15, 2024 11:24 AM DeAngela L wrote: Reason for CRM: Ginger calling [facility] calling to inform the office that they are out of network for the patient and also he possibly needs to see Neuroptomologist     [facility] Phone [phone]

## 2024-06-20 ENCOUNTER — Ambulatory Visit (INDEPENDENT_AMBULATORY_CARE_PROVIDER_SITE_OTHER): Payer: PRIVATE HEALTH INSURANCE | Admitting: Registered Nurse

## 2024-06-20 ENCOUNTER — Ambulatory Visit

## 2024-06-20 ENCOUNTER — Encounter: Payer: Self-pay | Admitting: Internal Medicine

## 2024-06-20 ENCOUNTER — Encounter (HOSPITAL_COMMUNITY): Payer: Self-pay | Admitting: Registered Nurse

## 2024-06-20 VITALS — BP 160/98 | HR 87 | Ht 67.0 in | Wt 217.2 lb

## 2024-06-20 DIAGNOSIS — F332 Major depressive disorder, recurrent severe without psychotic features: Secondary | ICD-10-CM | POA: Diagnosis not present

## 2024-06-20 DIAGNOSIS — F411 Generalized anxiety disorder: Secondary | ICD-10-CM

## 2024-06-20 DIAGNOSIS — G4700 Insomnia, unspecified: Secondary | ICD-10-CM

## 2024-06-20 MED ORDER — ARIPIPRAZOLE 5 MG PO TABS: 5.0000 mg | ORAL_TABLET | Freq: Every day | ORAL | 1 refills | Status: DC

## 2024-06-20 MED ORDER — MIRTAZAPINE 7.5 MG PO TABS: 7.5000 mg | ORAL_TABLET | Freq: Every day | ORAL | 0 refills | Status: DC

## 2024-06-20 MED ORDER — BUSPIRONE HCL 7.5 MG PO TABS: 7.5000 mg | ORAL_TABLET | Freq: Three times a day (TID) | ORAL | 1 refills | Status: DC

## 2024-06-20 NOTE — Progress Notes (Signed)
 Psychiatric Initial Adult Assessment   Patient Identification: [name] MRN:  [id number] Date of Evaluation:  06/20/2024 Referral Source: Karna Fellows, MD [facility] Chief Complaint:   Chief Complaint  Patient presents with   Establish Care    Medication management   Visit Diagnosis:    ICD-10-CM   1. Severe episode of recurrent major depressive disorder, without psychotic features (HCC)  F33.2 ARIPiprazole (ABILIFY) 5 MG tablet    busPIRone  (BUSPAR ) 7.5 MG tablet    mirtazapine (REMERON) 7.5 MG tablet    2. GAD (generalized anxiety disorder)  F41.1 busPIRone  (BUSPAR ) 7.5 MG tablet    mirtazapine (REMERON) 7.5 MG tablet    3. Insomnia, unspecified type  G47.00 mirtazapine (REMERON) 7.5 MG tablet      History of Present Illness:  [name] 58 y.o. male presents today to establish care for medication management.  He was seen face-to-face by this provider and chart reviewed on 010/13/2025.  His psychiatric history is significant for major depression, anxiety, alcohol use disorder, and insomnia.  His mental health is not currently managed with psychotropic medication.  He reports he has be tried on multiple psychotropic medications and none worked other than Xanax that helped with depression, anxiety, and sleep.  States it also helped to control his blood pressure.  State he was referred by his PCP when asked to be restarted on Xanax.  Reports primary stressors are Bell's Palsy and that he has asked PCP several times to be referred to neurology several times but hasn't happened.  He also states issues with paying bills, and the loss of all of his family.  Reports his mother, father, and brother all died from cancer.  Today he denies suicidal ideation, self-harm, homicidal ideation, psychosis, paranoia, and abnormal movements. Screenings completed during today's visit PHQ-9, C-SSRS, GAD-7, AIMS, AUDIT, Nutrition, and Pain, see scores below.     Recommendations:  Start Buspar  7.5 mg three times daily, Abilify 5 mg daily, and Remeron 7.5 mg daily at bed time.   He was educated on the side effect and efficacy profile of Buspar , Remeron, and Abilify.  Educational material was added to AVS. Informed that therapeutic effects may take several weeks to become noticeable. He voiced understanding and agreement with today's plan and recommendations.  Associated Signs/Symptoms: Depression Symptoms:  depressed mood, anhedonia, insomnia, feelings of worthlessness/guilt, anxiety, disturbed sleep, (Hypo) Manic Symptoms:  Irritable Mood, Labiality of Mood, Anxiety Symptoms:  Excessive Worry, Psychotic Symptoms:  Denies PTSD Symptoms: NA  Past Psychiatric History:  Diagnosis:  Major depression, anxiety, insomnia Suicide attempt:  Denies Non-suicidal self-injurious behavior:  Denies Psychiatric hospitalization:  Denies Past trauma:  Denies Substance abuse:  Reports a history of alcohol use disorder but has drank any alcohol since 2014 Past psychotropic medication trials:  Prozac ,Gabapentin , Propranolol, Trazodone , Zoloft , Lexapro, Paxil, Cymbalta , Buspar , Seroquel  Previous Psychotropic Medications: Yes   Substance Abuse History in the last 12 months:  No.  Consequences of Substance Abuse: History of jail time for driving under influence of alcohol  Past Medical History:  Past Medical History:  Diagnosis Date   Acute pancreatitis    Anxiety    Arthritis    Colonic adenoma    COPD (chronic obstructive pulmonary disease) (HCC)    Depression    Diverticulitis of colon with perforation 08/2010   GERD (gastroesophageal reflux disease)    History of hiatal hernia    Hypertension    Polysubstance abuse (HCC)    Renal insufficiency  Sleep apnea    Intolerant of CPAP   Stroke (HCC) 2021   TIA (transient ischemic attack)     Past Surgical History:  Procedure Laterality Date   APPENDECTOMY     BALLOON DILATION N/A 08/04/2022    Procedure: BALLOON DILATION;  Surgeon: Cindie Carlin POUR, DO;  [facility];  Service: Endoscopy;  Laterality: N/A;   BIOPSY  12/14/2017   Procedure: BIOPSY;  Surgeon: Shaaron Jason HERO, MD;  [facility];  Service: Endoscopy;;  gastric    BIOPSY  10/13/2019   Procedure: BIOPSY;  Surgeon: Shaaron Jason HERO, MD;  [facility];  Service: Endoscopy;;  esophagus   BIOPSY  05/20/2021   Procedure: BIOPSY;  Surgeon: Shaaron Jason HERO, MD;  [facility];  Service: Endoscopy;;   BIOPSY  12/18/2022   Procedure: BIOPSY;  Surgeon: Shaaron Jason HERO, MD;  [facility];  Service: Endoscopy;;   clamp left in colon after abd surgery     COLON SURGERY  05/2011   [facility]. exp laparotomy with colostomy takedown, lysis of adhesions (3hours), loop ileostomy. complicated by abscess formation requiring percutaneous drainage   colonoscopy via stoma with endoscopy of [place]  04/2011   [facility]. two sessile polyps in trv colon. complete resection but only partial retrieval. congested mucosa in area 3cm proximal to stoma, erythematous mucosa in Hartmann pouch. repeat tcs 8/2017path-adenomatous polyp   COLONOSCOPY WITH PROPOFOL  N/A 12/14/2017   Dr. Shaaron: 2 4 to 6 mm polyps in the cecum, tubular adenomas, single rectal polyp tubular adenoma, nonbleeding internal hemorrhoids.  Appears to be a surgical clip at the appendiceal orifice.  Next colonoscopy in 5 years   COLONOSCOPY WITH PROPOFOL  N/A 12/18/2022   Procedure: COLONOSCOPY WITH PROPOFOL ;  Surgeon: Shaaron Jason HERO, MD;  [facility];  Service: Endoscopy;  Laterality: N/A;  8:45 am   ESOPHAGEAL MANOMETRY N/A 12/11/2021   Procedure: ESOPHAGEAL MANOMETRY (EM);  Surgeon: Shila Gustav GAILS, MD;  [facility];  Service: Endoscopy;  Laterality: N/A;   ESOPHAGOGASTRODUODENOSCOPY  03/05/2012   erosive reflux esophagitis, bulbar erosions, gastric erosions/petichae, bx showed gastritis, no H.Pylori.  Procedure: ESOPHAGOGASTRODUODENOSCOPY (EGD);  Surgeon: Jason HERO Shaaron, MD;  [facility];  Service: Endoscopy;  Laterality: N/A;  11:30   ESOPHAGOGASTRODUODENOSCOPY (EGD) WITH PROPOFOL  N/A 12/14/2017   Dr. Shaaron: Erosive reflux esophagitis with questionable mild stricturing/non-obstruction at this level status post dilation.  multiple gastric erosions in the stomach biopsy showed reactive gastropathy but no H. pylori.   ESOPHAGOGASTRODUODENOSCOPY (EGD) WITH PROPOFOL  N/A 10/13/2019   negative Barrett's, normal stomach   ESOPHAGOGASTRODUODENOSCOPY (EGD) WITH PROPOFOL  N/A 05/20/2021   inlet patch with superimposed nodule s/p biopsy. Normal esophagus s/p dilation. Normal gastric mucosa. Normal duodenum. Reflux changes on path.   ESOPHAGOGASTRODUODENOSCOPY (EGD) WITH PROPOFOL  N/A 08/04/2022   Procedure: ESOPHAGOGASTRODUODENOSCOPY (EGD) WITH PROPOFOL ;  Surgeon: Cindie Carlin POUR, DO;  [facility];  Service: Endoscopy;  Laterality: N/A;  1:15 pm   ESOPHAGOGASTRODUODENOSCOPY (EGD) WITH PROPOFOL  N/A 12/18/2022   Procedure: ESOPHAGOGASTRODUODENOSCOPY (EGD) WITH PROPOFOL ;  Surgeon: Shaaron Jason HERO, MD;  [facility];  Service: Endoscopy;  Laterality: N/A;   FOOT SURGERY Left    strethching of ligaments   HERNIA REPAIR     umbilical   KNEE SURGERY Right 2006   MALONEY DILATION N/A 12/14/2017   Procedure: AGAPITO DILATION;  Surgeon: Shaaron Jason HERO, MD;  [facility];  Service: Endoscopy;  Laterality: N/A;  MALONEY DILATION  05/20/2021   Procedure: MALONEY DILATION;  Surgeon: Shaaron Jason HERO, MD;  [facility];  Service: Endoscopy;;   MALONEY DILATION N/A 12/18/2022   Procedure: AGAPITO HODGKIN;  Surgeon: Shaaron Jason HERO, MD;  [facility];  Service: Endoscopy;  Laterality: N/A;   MULTIPLE EXTRACTIONS WITH ALVEOLOPLASTY  02/23/2012   Procedure: MULTIPLE EXTRACION WITH ALVEOLOPLASTY;  Surgeon: Glendia HERO Primrose, DDS;  [facility]: [facility];  Service:  Oral Surgery;  Laterality: Bilateral;  Removal of left mandibular torus, multiple extractions with alveoloplasty   POLYPECTOMY  12/14/2017   Procedure: POLYPECTOMY;  Surgeon: Shaaron Jason HERO, MD;  [facility];  Service: Endoscopy;;   POLYPECTOMY  12/18/2022   Procedure: POLYPECTOMY;  Surgeon: Shaaron Jason HERO, MD;  [facility];  Service: Endoscopy;;   reversal of colostomy  10/2011   per patient, did not have report   right knee surgery     sigmoid colectomy with end colostomy, Hartmann's pouch  08/2010   [facility]. complicated diverticulitis   UMBILICAL HERNIA REPAIR      Family Psychiatric History: Unaware  Family History:  Family History  Problem Relation Age of Onset   Stomach cancer Father    Lung cancer Father    Throat cancer Father    Cancer Father        lung and throat cancer   Hypertension Father    Liver disease Cousin        cirrhosis, etoh   Throat cancer Paternal Uncle    Lung cancer Paternal Uncle    Colon cancer Cousin 52   Heart disease Mother    Hypertension Mother    Colon cancer Maternal Uncle    Throat cancer Paternal Uncle    Lung cancer Paternal Uncle    Throat cancer Paternal Uncle    Lung cancer Paternal Uncle     Social History:   Social History   Socioeconomic History   Marital status: Single    Spouse name: Not on file   Number of children: 0   Years of education: Not on file   Highest education level: Not on file  Occupational History   Not on file  Tobacco Use   Smoking status: Every Day    Current packs/day: 1.00    Average packs/day: 1 pack/day for 37.0 years (37.0 ttl pk-yrs)    Types: Cigarettes    Passive exposure: Current   Smokeless tobacco: Former   Tobacco comments:    Trying to quit, has nicotine  patches at home which have helped him quit before.  Smokes a little less than a pack a day.  Updated 07/13/2023.  Vaping Use   Vaping status: Never Used  Substance and Sexual Activity   Alcohol use: Not  Currently   Drug use: Yes    Types: Marijuana    Comment: occasional   Sexual activity: Not on file  Other Topics Concern   Not on file  Social History Narrative   Not on file   Social Drivers of Health   Financial Resource Strain: High Risk (11/24/2022)   Overall Financial Resource Strain (CARDIA)    Difficulty of Paying Living Expenses: Hard  Food Insecurity: Food Insecurity Present (11/24/2022)   Hunger Vital Sign    Worried About Running Out of Food in the Last Year: Often true    Ran Out of Food in the Last Year: Often true  Transportation Needs: No Transportation Needs (05/12/2024)   PRAPARE - Transportation  Lack of Transportation (Medical): No    Lack of Transportation (Non-Medical): No  Physical Activity: Not on file  Stress: Not on file  Social Connections: Not on file    Additional Social History:Unemployed, on disability, living with girlfriend  Allergies:   Allergies  Allergen Reactions   Aspirin  Other (See Comments)    Makes me feel bad.   Codeine Itching and Nausea And Vomiting   Latex Other (See Comments)    Turns skin red.    Morphine Hives   Statins Other (See Comments)    Joint pain   Zestril [Lisinopril]     Caused Kidney failure   Zetia  [Ezetimibe ] Other (See Comments)    Joint pain   Adhesive [Tape] Rash    Metabolic Disorder Labs: Lab Results  Component Value Date   HGBA1C 5.5 04/25/2020   MPG 111.15 04/25/2020   No results found for: PROLACTIN Lab Results  Component Value Date   CHOL 220 (H) 03/02/2024   TRIG 139 03/02/2024   HDL 34 (L) 03/02/2024   CHOLHDL 6.5 (H) 03/02/2024   VLDL 31 04/26/2020   LDLCALC 160 (H) 03/02/2024   LDLCALC 113 (H) 07/21/2023   Lab Results  Component Value Date   TSH 3.252 04/25/2020    Current Medications: Current Outpatient Medications  Medication Sig Dispense Refill   albuterol  (VENTOLIN  HFA) 108 (90 Base) MCG/ACT inhaler Inhale 1 puff into the lungs every 4 (four) hours as needed for  wheezing or shortness of breath. 18 each 2   ARIPiprazole (ABILIFY) 5 MG tablet Take 1 tablet (5 mg total) by mouth daily. 30 tablet 1   Blood Pressure Monitor DEVI Use as directed. 1 each 0   Buprenorphine  HCl-Naloxone  HCl (SUBOXONE ) 8-2 MG FILM PLACE 1 STRIPS UNDER THE TONGUE TWICE DAILY 60 Film 0   busPIRone  (BUSPAR ) 7.5 MG tablet Take 1 tablet (7.5 mg total) by mouth 3 (three) times daily. 90 tablet 1   cycloSPORINE (RESTASIS) 0.05 % ophthalmic emulsion Place 1 drop into both eyes 2 (two) times daily.     fluticasone  (FLONASE ) 50 MCG/ACT nasal spray SPRAY 2 SPRAYS INTO EACH NOSTRIL EVERY DAY (Patient taking differently: 2 sprays as needed.) 48 mL 1   hydrocortisone  (ANUSOL -HC) 2.5 % rectal cream Place 1 Application rectally 2 (two) times daily as needed for hemorrhoids. 28 g 2   inclisiran (LEQVIO ) 284 MG/1.5ML SOSY injection Inject 284 mg into the skin once.     isosorbide  mononitrate (IMDUR ) 30 MG 24 hr tablet Take 30 mg by mouth 2 (two) times daily.     mirtazapine (REMERON) 7.5 MG tablet Take 1 tablet (7.5 mg total) by mouth at bedtime. 30 tablet 0   oxyCODONE -acetaminophen  (PERCOCET/ROXICET) 5-325 MG tablet Take 1 tablet by mouth every 6 (six) hours as needed for severe pain (pain score 7-10). 15 tablet 0   pantoprazole  (PROTONIX ) 40 MG tablet TAKE 1 TABLET BY MOUTH TWICE A DAY 60 tablet 2   predniSONE  (DELTASONE ) 20 MG tablet Take 3 tablets (60 mg total) by mouth daily with breakfast for 14 days. 42 tablet 2   spironolactone  (ALDACTONE ) 25 MG tablet Take 1 tablet (25 mg total) by mouth daily. 30 tablet 3   hydrOXYzine  (ATARAX ) 10 MG tablet Take 1 tablet (10 mg total) by mouth 3 (three) times daily as needed. (Patient not taking: Reported on 06/20/2024) 30 tablet 0   pregabalin  (LYRICA ) 75 MG capsule Take 1 capsule (75 mg total) by mouth 2 (two) times daily. (Patient not taking: Reported  on 06/20/2024) 60 capsule 9   Current Facility-Administered Medications  Medication Dose Route  Frequency Provider Last Rate Last Admin   triamcinolone  acetonide (KENALOG -40) injection 20 mg  20 mg Other Once Janit Thresa HERO, DPM        Musculoskeletal: Strength & Muscle Tone: within normal limits Gait & Station: normal Patient leans: N/A  Psychiatric Specialty Exam: Review of Systems  Constitutional:        No other complaints at this time  Respiratory:         Also reports that he is supposed to be wearing CPAP machine but unable to wear canula and has been waiting on mask for several months but hasn't gotten it, referred to PCP  Cardiovascular:        History of HTN.  Increased blood pressure during visit, referred to PCP  Neurological:        History of Bell's Palsy referred to PCP for referral to neurology  Psychiatric/Behavioral:  Positive for agitation, dysphoric mood and sleep disturbance. Negative for hallucinations, self-injury and suicidal ideas. The patient is nervous/anxious.   All other systems reviewed and are negative.   Blood pressure (!) 160/98, pulse 87, height 5' 7 (1.702 m), weight 217 lb 3.2 oz (98.5 kg), SpO2 96%.Body mass index is 34.02 kg/m.  General Appearance: Casual  Eye Contact:  Good  Speech:  Clear and Coherent and Normal Rate  Volume:  Normal  Mood:  Anxious, Depressed, and Irritable  Affect:  Congruent  Thought Process:  Coherent, Goal Directed, and Descriptions of Associations: Intact  Orientation:  Full (Time, Place, and Person)  Thought Content:  Logical  Suicidal Thoughts:  No  Homicidal Thoughts:  No  Memory:  Immediate;   Good Recent;   Good Remote;   Good  Judgement:  Intact  Insight:  Present  Psychomotor Activity:  Normal  Concentration:  Concentration: Good and Attention Span: Good  Recall:  Good  Fund of Knowledge:Good  Language: Good  Akathisia:  No  Handed:  Right  AIMS (if indicated):  done  Assets:  Communication Skills Desire for Improvement Financial Resources/Insurance Housing Intimacy Resilience Social  Support Transportation  ADL's:  Intact  Cognition: WNL  Sleep:  Poor   Screenings: Geneticist, molecular Office Visit from 06/20/2024 in [facility] [place]  AIMS Total Score 0   GAD-7    Flowsheet Row Office Visit from 06/20/2024 in [facility] [place] Office Visit from 02/07/2021 in [facility] Of [place]. [facility]  Total GAD-7 Score 18 21   PHQ2-9    Flowsheet Row Office Visit from 06/20/2024 in [facility] at [facility] Visit from 05/12/2024 in [facility] Dept Of [place]. [facility] Clinical Support from 03/18/2024 in [facility] at [facility] Visit from 06/09/2023 in [facility] - A Dept Of [place]. [facility] Office Visit from 11/24/2022 in [facility] - A Dept Of [place]. [facility]  PHQ-2 Total Score 5 2 0 0 2  PHQ-9 Total Score 20 13 -- -- 11   Flowsheet Row Office Visit from 06/20/2024 in [facility] [place] ED from 06/15/2024 in [facility] from 04/01/2024 in [facility] at [facility]  C-SSRS RISK CATEGORY No Risk No Risk No Risk    Assessment and Plan:  Assessment: Summary of today's assessment: [name] appears to be doing fairly well.  Reports not currently taking medications for mental health but has had multiple trials of psychotropic medications.  Reports depression, anxiety, insomnia, and mood swings.  He denies suicidal/self-harm/homicidal ideation, psychosis, paranoia, and abnormal movements. During visit he was dressed appropriate for age and weather.  He was seated comfortably in chair with no noted distress.  He was alert/oriented x 4, calm/cooperative and mood congruent with affect.  He spoke in a  clear tone at moderate volume, and normal pace, with good eye contact.  His thought process was coherent, relevant, and there was no indication that he was responding to internal/external stimuli or experiencing delusional thought content.  1. Severe episode of recurrent major depressive disorder, without psychotic features (HCC) (Primary) - ARIPiprazole (ABILIFY) 5 MG tablet; Take 1 tablet (5 mg total) by mouth daily.  Dispense: 30 tablet; Refill: 1 - busPIRone  (BUSPAR ) 7.5 MG tablet; Take 1 tablet (7.5 mg total) by mouth 3 (three) times daily.  Dispense: 90 tablet; Refill: 1 - mirtazapine (REMERON) 7.5 MG tablet; Take 1 tablet (7.5 mg total) by mouth at bedtime.  Dispense: 30 tablet; Refill: 0  2. GAD (generalized anxiety disorder) - busPIRone  (BUSPAR ) 7.5 MG tablet; Take 1 tablet (7.5 mg total) by mouth 3 (three) times daily.  Dispense: 90 tablet; Refill: 1 - mirtazapine (REMERON) 7.5 MG tablet; Take 1 tablet (7.5 mg total) by mouth at bedtime.  Dispense: 30 tablet; Refill: 0  3. Insomnia, unspecified type - mirtazapine (REMERON) 7.5 MG tablet; Take 1 tablet (7.5 mg total) by mouth at bedtime.  Dispense: 30 tablet; Refill: 0       Plan: Medication management: Meds ordered this encounter  Medications   ARIPiprazole (ABILIFY) 5 MG tablet    Sig: Take 1 tablet (5 mg total) by mouth daily.    Dispense:  30 tablet    Refill:  1    Supervising Provider:   CURRY, SYED T [[id number]]   busPIRone  (BUSPAR ) 7.5 MG tablet    Sig: Take 1 tablet (7.5 mg total) by mouth 3 (three) times daily.    Dispense:  90 tablet    Refill:  1    Supervising Provider:   CURRY, SYED T [[id number]]   mirtazapine (REMERON) 7.5 MG tablet    Sig: Take 1 tablet (7.5 mg total) by mouth at bedtime.    Dispense:  30 tablet    Refill:  0    Supervising Provider:   CURRY PATERSON T [[id number]]   There are no discontinued medications.  Labs:  Not indicated at this time.      Other:  Counseling/Therapy:  Referred but declined at  this time.   [name] was instructed to call 911, [phone], mobile crisis, or present to the nearest emergency room should he experiences any suicidal/homicidal ideation, auditory/visual/hallucinations, or detrimental worsening of his mental health condition.   [name] participated in the development of this treatment plan and verbalized his understanding/agreement with plan as listed.   Follow Up: Return in 3 weeks for medication management Call in the interim for any side-effects, decompensation, questions, or problems  Collaboration of Care: Medication Management AEB medication assessment, started Buspar , Abilify, and Remeron, Primary Care Provider AEB for assessment of high blood pressure and medication management, Referral or follow-up with counselor/therapist AEB Referral to counseling/therapy but declined at this  time., and Other GeneSight testing ordered  Patient/Guardian was advised Release of Information must be obtained prior to any record release in order to collaborate their care with an outside provider. Patient/Guardian was advised if they have not already done so to contact the registration department to sign all necessary forms in order for us  to release information regarding their care.   Consent: Patient/Guardian gives verbal consent for treatment and assignment of benefits for services provided during this visit. Patient/Guardian expressed understanding and agreed to proceed.   Jason Anspaugh, NP 10/13/20258:11 PM

## 2024-06-20 NOTE — Patient Instructions (Signed)
 Call 911, [phone], mobile crisis, or present to the nearest emergency room should you experience any suicidal/homicidal ideation, auditory/visual/hallucinations, or detrimental worsening of your mental health.  Mobile Crisis Response Teams Listed by counties in vicinity of [facility] providers [facility] [phone] [facility] [phone] [facility] [phone] [facility] [phone] [place]                * [facility] [phone]                * [employer] [phone]  [facility] [phone] [facility] [employer]  [phone] * [employer] [phone]     Please be sure to join your visit at least 10 minutes before your scheduled time. Click here for a flyer with additional tips to help you have a successful video visit.  When it is time for your visit, click the green Begin Video Visit button above.   Be sure to select allow for your device to access the microphone and camera for your visit.   You will then be connected to the virtual waiting room. Your provider will be with you shortly.  If you have any issues connecting, or need assistance, please contact our MyChart service desk [phone])   In order to ensure the best quality for your visit you will need to use either of the following internet browsers: [employer] or [employer].

## 2024-06-22 ENCOUNTER — Encounter (HOSPITAL_COMMUNITY): Payer: Self-pay | Admitting: Internal Medicine

## 2024-06-22 ENCOUNTER — Other Ambulatory Visit: Payer: Self-pay

## 2024-06-22 ENCOUNTER — Encounter (HOSPITAL_COMMUNITY)
Admission: RE | Disposition: A | Discharge status: Home / Self Care | Payer: Self-pay | Source: Home / Self Care | Attending: Internal Medicine

## 2024-06-22 ENCOUNTER — Ambulatory Visit (HOSPITAL_COMMUNITY): Admitting: Anesthesiology

## 2024-06-22 ENCOUNTER — Ambulatory Visit (HOSPITAL_BASED_OUTPATIENT_CLINIC_OR_DEPARTMENT_OTHER): Admitting: Anesthesiology

## 2024-06-22 ENCOUNTER — Ambulatory Visit (HOSPITAL_COMMUNITY)
Admission: RE | Admit: 2024-06-22 | Discharge: 2024-06-22 | Disposition: A | Discharge status: Home / Self Care | Attending: Internal Medicine | Admitting: Internal Medicine

## 2024-06-22 DIAGNOSIS — K2100 Gastro-esophageal reflux disease with esophagitis, without bleeding: Secondary | ICD-10-CM | POA: Diagnosis not present

## 2024-06-22 DIAGNOSIS — Z79899 Other long term (current) drug therapy: Secondary | ICD-10-CM | POA: Diagnosis not present

## 2024-06-22 DIAGNOSIS — R1310 Dysphagia, unspecified: Secondary | ICD-10-CM

## 2024-06-22 DIAGNOSIS — K449 Diaphragmatic hernia without obstruction or gangrene: Secondary | ICD-10-CM | POA: Diagnosis not present

## 2024-06-22 DIAGNOSIS — J449 Chronic obstructive pulmonary disease, unspecified: Secondary | ICD-10-CM | POA: Insufficient documentation

## 2024-06-22 DIAGNOSIS — F418 Other specified anxiety disorders: Secondary | ICD-10-CM

## 2024-06-22 DIAGNOSIS — I10 Essential (primary) hypertension: Secondary | ICD-10-CM | POA: Insufficient documentation

## 2024-06-22 DIAGNOSIS — F419 Anxiety disorder, unspecified: Secondary | ICD-10-CM | POA: Diagnosis not present

## 2024-06-22 DIAGNOSIS — Z8673 Personal history of transient ischemic attack (TIA), and cerebral infarction without residual deficits: Secondary | ICD-10-CM | POA: Diagnosis not present

## 2024-06-22 DIAGNOSIS — G4730 Sleep apnea, unspecified: Secondary | ICD-10-CM | POA: Diagnosis not present

## 2024-06-22 DIAGNOSIS — F32A Depression, unspecified: Secondary | ICD-10-CM | POA: Diagnosis not present

## 2024-06-22 DIAGNOSIS — K219 Gastro-esophageal reflux disease without esophagitis: Secondary | ICD-10-CM | POA: Diagnosis not present

## 2024-06-22 DIAGNOSIS — F17210 Nicotine dependence, cigarettes, uncomplicated: Secondary | ICD-10-CM | POA: Diagnosis not present

## 2024-06-22 DIAGNOSIS — R1314 Dysphagia, pharyngoesophageal phase: Secondary | ICD-10-CM | POA: Insufficient documentation

## 2024-06-22 HISTORY — PX: ESOPHAGEAL DILATION: SHX303

## 2024-06-22 HISTORY — PX: ESOPHAGOGASTRODUODENOSCOPY: SHX5428

## 2024-06-22 SURGERY — EGD (ESOPHAGOGASTRODUODENOSCOPY)
Anesthesia: General

## 2024-06-22 MED ORDER — LIDOCAINE 2% (20 MG/ML) 5 ML SYRINGE
INTRAMUSCULAR | Status: DC | PRN
Start: 2024-06-22 — End: 2024-06-22
  Administered 2024-06-22: 100 mg via INTRAVENOUS

## 2024-06-22 MED ORDER — LACTATED RINGERS IV SOLN: INTRAVENOUS | Status: DC

## 2024-06-22 MED ORDER — LACTATED RINGERS IV SOLN: INTRAVENOUS | Status: DC | PRN

## 2024-06-22 MED ORDER — PROPOFOL 500 MG/50ML IV EMUL
INTRAVENOUS | Status: DC | PRN
  Administered 2024-06-22: 150 ug/kg/min via INTRAVENOUS
  Administered 2024-06-22: 70 mg via INTRAVENOUS
  Administered 2024-06-22: 20 mg via INTRAVENOUS

## 2024-06-22 NOTE — Discharge Instructions (Addendum)
 EGD Discharge instructions Please read the instructions outlined below and refer to this sheet in the next few weeks. These discharge instructions provide you with general information on caring for yourself after you leave the hospital. Your doctor may also give you specific instructions. While your treatment has been planned according to the most current medical practices available, unavoidable complications occasionally occur. If you have any problems or questions after discharge, please call your doctor. ACTIVITY You may resume your regular activity but move at a slower pace for the next 24 hours.  Take frequent rest periods for the next 24 hours.  Walking will help expel (get rid of) the air and reduce the bloated feeling in your abdomen.  No driving for 24 hours (because of the anesthesia (medicine) used during the test).  You may shower.  Do not sign any important legal documents or operate any machinery for 24 hours (because of the anesthesia used during the test).  NUTRITION Drink plenty of fluids.  You may resume your normal diet.  Begin with a light meal and progress to your normal diet.  Avoid alcoholic beverages for 24 hours or as instructed by your caregiver.  MEDICATIONS You may resume your normal medications unless your caregiver tells you otherwise.  WHAT YOU CAN EXPECT TODAY You may experience abdominal discomfort such as a feeling of fullness or "gas" pains.  FOLLOW-UP Your doctor will discuss the results of your test with you.  SEEK IMMEDIATE MEDICAL ATTENTION IF ANY OF THE FOLLOWING OCCUR: Excessive nausea (feeling sick to your stomach) and/or vomiting.  Severe abdominal pain and distention (swelling).  Trouble swallowing.  Temperature over 101 F (37.8 C).  Rectal bleeding or vomiting of blood.    Your esophagus was dilated nicely today  Continue Protonix  40 mg twice daily  Office visit with us  in 6 months  At patient request, I called Cy Row at  [phone] findings and recommendations

## 2024-06-22 NOTE — Transfer of Care (Signed)
 Immediate Anesthesia Transfer of Care Note  Patient: [name]  Procedure(s) Performed: EGD (ESOPHAGOGASTRODUODENOSCOPY) DILATION, ESOPHAGUS  Patient Location: Short Stay  Anesthesia Type:General  Level of Consciousness: awake, alert , and oriented  Airway & Oxygen  Therapy: Patient Spontanous Breathing  Post-op Assessment: Report given to RN and Post -op Vital signs reviewed and stable  Post vital signs: Reviewed and stable  Last Vitals:  Vitals Value Taken Time  BP 119/81 06/22/24 13:44  Temp    Pulse 66 06/22/24 13:44  Resp 10 06/22/24 13:44  SpO2 96 % 06/22/24 13:44    Last Pain:  Vitals:   06/22/24 1344  TempSrc: Oral  PainSc: 0-No pain         Complications: No notable events documented.

## 2024-06-22 NOTE — Interval H&P Note (Signed)
 History and Physical Interval Note:  06/22/2024 1:09 PM  [name]  has presented today for surgery, with the diagnosis of DYSPHAGIA, GERD.  The various methods of treatment have been discussed with the patient and family. After consideration of risks, benefits and other options for treatment, the patient has consented to  Procedure(s) with comments: EGD (ESOPHAGOGASTRODUODENOSCOPY) (N/A) - 11:00am, asa 3 DILATION, ESOPHAGUS (N/A) as a surgical intervention.  The patient's history has been reviewed, patient examined, no change in status, stable for surgery.  I have reviewed the patient's chart and labs.  Questions were answered to the patient's satisfaction.     Gabrille Kilbride  No change.  Esophageal dysphagia.-Recurrent.  I will offer the patient EGD with esophageal dilation is feasible and appropriate today.  Per plan.   The risks, benefits, limitations, alternatives and imponderables have been reviewed with the patient. Potential for esophageal dilation, biopsy, etc. have also been reviewed.  Questions have been answered. All parties agreeable.

## 2024-06-22 NOTE — Op Note (Signed)
 [facility] Patient Name: [name] Procedure Date: 06/22/2024 12:30 PM MRN: [id number] Date of Birth: [birth year] Attending MD: Lamar Ozell Hollingshead , MD, [id number] CSN: [id number] Age: 58 Admit Type: Outpatient Procedure:                Upper GI endoscopy Indications:              Dysphagia Providers:                Lamar Ozell Hollingshead, MD, Crystal Page, Alm Dorcas Balm., Technician Referring MD:              Medicines:                Propofol  per Anesthesia Complications:            No immediate complications. Estimated Blood Loss:     Estimated blood loss was minimal. Procedure:                Pre-Anesthesia Assessment:                           - Prior to the procedure, a History and Physical                            was performed, and patient medications and                            allergies were reviewed. The patient's tolerance of                            previous anesthesia was also reviewed. The risks                            and benefits of the procedure and the sedation                            options and risks were discussed with the patient.                            All questions were answered, and informed consent                            was obtained. ASA Grade Assessment: III - A patient                            with severe systemic disease. After reviewing the                            risks and benefits, the patient was deemed in                            satisfactory condition to undergo the procedure.  After obtaining informed consent, the endoscope was                            passed under direct vision. Throughout the                            procedure, the patient's blood pressure, pulse, and                            oxygen  saturations were monitored continuously. The                            Endoscope was introduced through the mouth, and                             advanced to the second part of duodenum. The upper                            GI endoscopy was accomplished without difficulty.                            The patient tolerated the procedure well. Scope In: 1:26:08 PM Scope Out: 1:35:48 PM Total Procedure Duration: 0 hours 9 minutes 40 seconds  Findings:      The examined esophagus was normal. Although, I did feel the tip of the       gastroscope slightly catch as the GE junction was traversed. Gastric       cavity ampulla small hiatal hernia present. Gastric mucosa appeared       normal pylorus patent. doscope reinsertion and showed moderate mucosal       disruption. Estimated blood loss was minimal.      The duodenal bulb and second portion of the duodenum were normal. The       scope was withdrawn. Dilation was attempted, but the lesion was not       amenable to treatment with a Maloney dilator because mild resistance at       56 Fr. The scope was withdrawn. Dilation was attempted, but the lesion       was not amenable to treatment with a Maloney dilator because mild       resistance at 58 Fr. The scope was withdrawn. Dilation was performed       with a Maloney dilator with moderate resistance at 60 Fr. The dilation       site was examined following endoscope reinsertion and showed moderate       mucosal disruption. Estimated blood loss was minimal. Impression:               - Normal esophagus. Status post serial Maloney                            dilation as described above. 2 cm hiatal hernia.-                            Normal duodenal bulb and second portion of the  duodenum.                           - No specimens collected. I suspect the patient had                            a recalcitrant submucosal ring to explain his                            persisting dysphagia. I am hopeful that the ring                            was disrupted today Moderate Sedation:      Moderate (conscious) sedation was  personally administered by an       anesthesia professional. The following parameters were monitored: oxygen        saturation, heart rate, blood pressure, respiratory rate, EKG, adequacy       of pulmonary ventilation, and response to care. Recommendation:           - Patient has a contact number available for                            emergencies. The signs and symptoms of potential                            delayed complications were discussed with the                            patient. Return to normal activities tomorrow.                            Written discharge instructions were provided to the                            patient.                           - Resume previous diet. Continue twice daily                            Protonix .                           - Continue present medications.                           - Office visit in 6 months. Procedure Code(s):        --- Professional ---                           [phone], Esophagogastroduodenoscopy, flexible,                            transoral; diagnostic, including collection of                            specimen(s) by brushing  or washing, when performed                            (separate procedure)                           43450, Dilation of esophagus, by unguided sound or                            bougie, single or multiple passes Diagnosis Code(s):        --- Professional ---                           R13.10, Dysphagia, unspecified CPT copyright 2022 American Medical Association. All rights reserved. The codes documented in this report are preliminary and upon coder review may  be revised to meet current compliance requirements. [name]. [name], MD Lamar Ozell Hollingshead, MD 06/22/2024 1:49:24 PM This report has been signed electronically. Number of Addenda: 0

## 2024-06-22 NOTE — Interval H&P Note (Signed)
     History and Physical Interval Note:  06/22/2024 1:13 PM  [name]  has presented today for surgery, with the diagnosis of DYSPHAGIA, GERD.  The various methods of treatment have been discussed with the patient and family. After consideration of risks, benefits and other options for treatment, the patient has consented to  Procedure(s) with comments: EGD (ESOPHAGOGASTRODUODENOSCOPY) (N/A) - 11:00am, asa 3 DILATION, ESOPHAGUS (N/A) as a surgical intervention.  The patient's history has been reviewed, patient examined, no change in status, stable for surgery.  I have reviewed the patient's chart and labs.  Questions were answered to the patient's satisfaction.     [name]   Recurrent esophageal dysphagia.  EGD with esophageal dilation is feasible/appropriate today per plan.  The risks, benefits, limitations, alternatives and imponderables have been reviewed with the patient. Potential for esophageal dilation, biopsy, etc. have also been reviewed.  Questions have been answered. All parties agreeable.

## 2024-06-22 NOTE — Anesthesia Preprocedure Evaluation (Signed)
 Anesthesia Evaluation  Patient identified by MRN, date of birth, ID band Patient awake    Reviewed: Allergy & Precautions, H&P , NPO status , Patient's Chart, lab work & pertinent test results, reviewed documented beta blocker date and time   Airway Mallampati: II  TM Distance: >3 FB Neck ROM: full    Dental no notable dental hx.    Pulmonary sleep apnea , COPD, Current Smoker and Patient abstained from smoking.   Pulmonary exam normal breath sounds clear to auscultation       Cardiovascular Exercise Tolerance: Good hypertension,  Rhythm:regular Rate:Normal     Neuro/Psych  PSYCHIATRIC DISORDERS Anxiety Depression    TIA Neuromuscular disease CVA    GI/Hepatic Neg liver ROS, hiatal hernia,GERD  ,,  Endo/Other  negative endocrine ROS    Renal/GU Renal disease  negative genitourinary   Musculoskeletal   Abdominal   Peds  Hematology negative hematology ROS (+)   Anesthesia Other Findings   Reproductive/Obstetrics negative OB ROS                              Anesthesia Physical Anesthesia Plan  ASA: 3  Anesthesia Plan: General   Post-op Pain Management:    Induction:   PONV Risk Score and Plan: Propofol  infusion  Airway Management Planned:   Additional Equipment:   Intra-op Plan:   Post-operative Plan:   Informed Consent: I have reviewed the patients History and Physical, chart, labs and discussed the procedure including the risks, benefits and alternatives for the proposed anesthesia with the patient or authorized representative who has indicated his/her understanding and acceptance.     Dental Advisory Given  Plan Discussed with: CRNA  Anesthesia Plan Comments:         Anesthesia Quick Evaluation

## 2024-06-24 ENCOUNTER — Encounter (HOSPITAL_COMMUNITY): Payer: Self-pay | Admitting: Internal Medicine

## 2024-06-27 ENCOUNTER — Encounter: Payer: Self-pay | Admitting: Student

## 2024-06-27 ENCOUNTER — Ambulatory Visit (INDEPENDENT_AMBULATORY_CARE_PROVIDER_SITE_OTHER): Admitting: Student

## 2024-06-27 VITALS — BP 168/88 | HR 81 | Temp 97.6°F | Ht 67.0 in | Wt 219.8 lb

## 2024-06-27 DIAGNOSIS — F17210 Nicotine dependence, cigarettes, uncomplicated: Secondary | ICD-10-CM

## 2024-06-27 DIAGNOSIS — Z7952 Long term (current) use of systemic steroids: Secondary | ICD-10-CM

## 2024-06-27 DIAGNOSIS — I10 Essential (primary) hypertension: Secondary | ICD-10-CM

## 2024-06-27 DIAGNOSIS — M316 Other giant cell arteritis: Secondary | ICD-10-CM

## 2024-06-27 DIAGNOSIS — Z8249 Family history of ischemic heart disease and other diseases of the circulatory system: Secondary | ICD-10-CM

## 2024-06-27 DIAGNOSIS — Z91128 Patient's intentional underdosing of medication regimen for other reason: Secondary | ICD-10-CM

## 2024-06-27 DIAGNOSIS — Z79899 Other long term (current) drug therapy: Secondary | ICD-10-CM

## 2024-06-27 NOTE — Assessment & Plan Note (Signed)
 BP elevated, suspect this is due to ongoing headaches.  Further, he is yet to take morning BP meds.  OP regimen includes spironolactone  50 mg and HCTZ 25 mg. -Continue treatment as above - BMP on next office visit.

## 2024-06-27 NOTE — Progress Notes (Signed)
 CC: Follow-up on eye weakness  HPI:  [name] is a 58 y.o. male living with a history stated below and presents today for follow-up on left eye swelling.  Patient was last seen in the resident [facility] on 10/06, at which time he was started on prednisone  60 mg daily due to concern for giant cell arteritis (GCA). Over the past week, he has been taking prednisone  20 mg daily instead, as the higher dose was causing insomnia. He reports no significant improvement in vision since starting treatment.  He has been referred to rheumatology, with an appointment scheduled for January 2026. He was also referred to ophthalmology and is waiting to be seen for further evaluation and management.  Please see problem based assessment and plan for additional details.  Past Medical History:  Diagnosis Date   Acute pancreatitis    Anxiety    Arthritis    Colonic adenoma    COPD (chronic obstructive pulmonary disease) (HCC)    Depression    Diverticulitis of colon with perforation 08/2010   GERD (gastroesophageal reflux disease)    History of hiatal hernia    Hypertension    Polysubstance abuse (HCC)    Renal insufficiency    Sleep apnea    Intolerant of CPAP   Stroke (HCC) 2021   TIA (transient ischemic attack)     Current Outpatient Medications on File Prior to Visit  Medication Sig Dispense Refill   albuterol  (VENTOLIN  HFA) 108 (90 Base) MCG/ACT inhaler Inhale 1 puff into the lungs every 4 (four) hours as needed for wheezing or shortness of breath. 18 each 2   ARIPiprazole (ABILIFY) 5 MG tablet Take 1 tablet (5 mg total) by mouth daily. 30 tablet 1   Blood Pressure Monitor DEVI Use as directed. 1 each 0   Buprenorphine  HCl-Naloxone  HCl (SUBOXONE ) 8-2 MG FILM PLACE 1 STRIPS UNDER THE TONGUE TWICE DAILY 60 Film 0   busPIRone  (BUSPAR ) 7.5 MG tablet Take 1 tablet (7.5 mg total) by mouth 3 (three) times daily. 90 tablet 1   cycloSPORINE (RESTASIS) 0.05 % ophthalmic emulsion Place 1 drop into  both eyes 2 (two) times daily.     fluticasone  (FLONASE ) 50 MCG/ACT nasal spray SPRAY 2 SPRAYS INTO EACH NOSTRIL EVERY DAY (Patient taking differently: 2 sprays as needed.) 48 mL 1   hydrocortisone  (ANUSOL -HC) 2.5 % rectal cream Place 1 Application rectally 2 (two) times daily as needed for hemorrhoids. 28 g 2   hydrOXYzine  (ATARAX ) 10 MG tablet Take 1 tablet (10 mg total) by mouth 3 (three) times daily as needed. (Patient not taking: Reported on 06/20/2024) 30 tablet 0   inclisiran (LEQVIO ) 284 MG/1.5ML SOSY injection Inject 284 mg into the skin once.     isosorbide  mononitrate (IMDUR ) 30 MG 24 hr tablet Take 30 mg by mouth 2 (two) times daily.     mirtazapine (REMERON) 7.5 MG tablet Take 1 tablet (7.5 mg total) by mouth at bedtime. (Patient taking differently: Take 7.5 mg by mouth at bedtime. Hasn't started yet) 30 tablet 0   oxyCODONE -acetaminophen  (PERCOCET/ROXICET) 5-325 MG tablet Take 1 tablet by mouth every 6 (six) hours as needed for severe pain (pain score 7-10). 15 tablet 0   pantoprazole  (PROTONIX ) 40 MG tablet TAKE 1 TABLET BY MOUTH TWICE A DAY 60 tablet 2   predniSONE  (DELTASONE ) 20 MG tablet Take 3 tablets (60 mg total) by mouth daily with breakfast for 14 days. 42 tablet 2   pregabalin  (LYRICA ) 75 MG capsule Take 1  capsule (75 mg total) by mouth 2 (two) times daily. (Patient not taking: Reported on 06/20/2024) 60 capsule 9   spironolactone  (ALDACTONE ) 25 MG tablet Take 1 tablet (25 mg total) by mouth daily. 30 tablet 3   Current Facility-Administered Medications on File Prior to Visit  Medication Dose Route Frequency Provider Last Rate Last Admin   triamcinolone  acetonide (KENALOG -40) injection 20 mg  20 mg Other Once Janit Thresa HERO, DPM        Review of Systems: ROS negative except for what is noted on the assessment and plan.  Vitals:   06/27/24 1454  BP: (!) 168/88  Pulse: 81  Temp: 97.6 F (36.4 C)  TempSrc: Oral  SpO2: 95%  Weight: 219 lb 12.8 oz (99.7 kg)  Height:  5' 7 (1.702 m)    Physical Exam: Constitutional: NAD HEENT: Pupils reactive, extraocular eye movement normal.  No conjunctival injection.  Mild drooping of the left eye, left temporal tenderness noted. Cardiovascular: RRR, no murmurs. Pulmonary/Chest: Clear bilateral lungs Abdominal: soft, non-tender, non-distended. Neuro: CN II-XII normal.  No focal weakness.  Assessment & Plan:   Patient discussed with Dr. CHARLENA Eastern  Assessment & Plan GCA (giant cell arteritis) (HCC) Concern for giant cell arteritis (GCA) -- patient reports headache, mild visual changes, and tenderness over the temporal artery. Currently taking prednisone  20 mg daily, instead of the prescribed 60 mg. - Will repeat ESR and CRP. -Ideally prednisone  taper would have followed 60 mg ? 40 mg, but given reduction to 20 mg due to jitteriness, will continue current dose for now. - Referral to vascular surgery for temporal artery biopsy to confirm diagnosis. - Monitor for visual changes, jaw claudication, or scalp tenderness; advised to report any worsening immediately. - Advised to follow-up with ophthalmologist Primary hypertension BP elevated, suspect this is due to ongoing headaches.  Further, he is yet to take morning BP meds.  OP regimen includes spironolactone  50 mg and HCTZ 25 mg. -Continue treatment as above - BMP on next office visit.  Orders Placed This Encounter  Procedures   Sed Rate (ESR)   CRP (C-Reactive Protein)   Ambulatory referral to Vascular Surgery    Missy Sandhoff, MD [facility] Internal Medicine, PGY-2  Date 06/27/2024 Time 5:17 PM

## 2024-06-27 NOTE — Patient Instructions (Addendum)
 It was a pleasure taking care of you today!    1.  Please decrease your steroid dose to 20 mg daily.  2.  I have placed a referral for you to be seen by vascular surgery  3.  Someone from our office will reach out to see if you can see ophthalmologist sooner.  I have ordered the following labs for you:   Lab Orders         Sed Rate (ESR)         CRP (C-Reactive Protein)       Follow up: 2 weeks   If you have worsening eye pain, headaches please go to your nearest emergency room.  Missy Sandhoff, MD  [facility]

## 2024-06-28 ENCOUNTER — Ambulatory Visit: Payer: Self-pay | Admitting: Student

## 2024-06-28 LAB — SEDIMENTATION RATE: Sed Rate: 21 mm/h (ref 0–30)

## 2024-06-28 LAB — C-REACTIVE PROTEIN: CRP: 10 mg/L (ref 0–10)

## 2024-06-28 NOTE — Progress Notes (Signed)
 ESR and CRP downtrending. Referral sent to vascular surgery for temporal artery biopsy. Communicated with Ms. Chilon (referral coordinator) to ensure urgent ophthalmology follow-up.

## 2024-06-29 ENCOUNTER — Other Ambulatory Visit: Payer: Self-pay | Admitting: Student

## 2024-06-29 NOTE — Progress Notes (Signed)
 Outgoing call placed to update patient regarding ophthalmology referral. He was seen on 10/20 for follow-up and concern for CGA. The outpatient ophthalmologist declined to schedule a clinic visit since the patient has not presented with acute vision changes through the urgent/emergency department. If vision difficulties still persist, the patient is advised to seek evaluation in the ED, after which ophthalmology can assess him.  I have discussed  case with my supervising physician, Dr. Rosan, and he agrees with the plan.  Unable to relay this information as the patient's voicemail box is full and not accepting messages. Will reattempt later.

## 2024-06-29 NOTE — Anesthesia Postprocedure Evaluation (Signed)
 Anesthesia Post Note  Patient: [name]  Procedure(s) Performed: EGD (ESOPHAGOGASTRODUODENOSCOPY) DILATION, ESOPHAGUS  Patient location during evaluation: Phase II Anesthesia Type: General Level of consciousness: awake Pain management: pain level controlled Vital Signs Assessment: post-procedure vital signs reviewed and stable Respiratory status: spontaneous breathing and respiratory function stable Cardiovascular status: blood pressure returned to baseline and stable Postop Assessment: no headache and no apparent nausea or vomiting Anesthetic complications: no Comments: Late entry   No notable events documented.   Last Vitals:  Vitals:   06/22/24 0939 06/22/24 1344  BP: 124/86 119/81  Pulse: (!) 59 66  Resp: 10 10  Temp: 36.8 C   SpO2: 95% 96%    Last Pain:  Vitals:   06/23/24 1316  TempSrc:   PainSc: 0-No pain                 Yvonna JINNY Bosworth

## 2024-06-29 NOTE — Telephone Encounter (Unsigned)
 Copied from CRM #[id number]. Topic: Referral - Status >> Jun 28, 2024 12:25 PM Alfonso ORN wrote: Reason for CRM: [place] calling  from [facility]  want to let provider know regarding the referral this office is out of network with pt. insurnace [phone]

## 2024-06-30 NOTE — Progress Notes (Signed)
 Internal Medicine Clinic Attending  Case discussed with the resident at the time of the visit.  We reviewed the resident's history and exam and pertinent patient test results.  I agree with the assessment, diagnosis, and plan of care documented in the resident's note.

## 2024-07-04 ENCOUNTER — Other Ambulatory Visit: Payer: Self-pay | Admitting: Student

## 2024-07-04 ENCOUNTER — Encounter: Payer: Self-pay | Admitting: Internal Medicine

## 2024-07-04 DIAGNOSIS — F1110 Opioid abuse, uncomplicated: Secondary | ICD-10-CM

## 2024-07-04 NOTE — Telephone Encounter (Unsigned)
 Copied from CRM #[id number]. Topic: Clinical - Medication Refill >> Jul 04, 2024  1:05 PM Debby BROCKS wrote: Medication: Buprenorphine  HCl-Naloxone  HCl (SUBOXONE ) 8-2 MG FILM oxyCODONE -acetaminophen  (PERCOCET/ROXICET) 5-325 MG tablet  Has the patient contacted their pharmacy? Yes (Agent: If no, request that the patient contact the pharmacy for the refill. If patient does not wish to contact the pharmacy document the reason why and proceed with request.) (Agent: If yes, when and what did the pharmacy advise?)  Patient states they were sent over but he never picked them up and now the pharmacy advised him that he needs to call it in again  This is the patient's preferred pharmacy:  [id number] - [place], [place] - [address] [place] [address] Phone: [phone] Fax: [phone]  Is this the correct pharmacy for this prescription? Yes If no, delete pharmacy and type the correct one.   Has the prescription been filled recently? Yes  Is the patient out of the medication? Yes  Has the patient been seen for an appointment in the last year OR does the patient have an upcoming appointment? Yes  Can we respond through MyChart? Yes  Agent: Please be advised that Rx refills may take up to 3 business days. We ask that you follow-up with your pharmacy.

## 2024-07-05 ENCOUNTER — Encounter: Payer: Self-pay | Admitting: Nurse Practitioner

## 2024-07-05 ENCOUNTER — Ambulatory Visit: Payer: Self-pay | Attending: Nurse Practitioner | Admitting: Nurse Practitioner

## 2024-07-05 ENCOUNTER — Other Ambulatory Visit: Payer: Self-pay

## 2024-07-05 VITALS — BP 140/80 | HR 78 | Ht 67.0 in | Wt 220.2 lb

## 2024-07-05 DIAGNOSIS — F1910 Other psychoactive substance abuse, uncomplicated: Secondary | ICD-10-CM | POA: Diagnosis present

## 2024-07-05 DIAGNOSIS — I10 Essential (primary) hypertension: Secondary | ICD-10-CM | POA: Insufficient documentation

## 2024-07-05 DIAGNOSIS — I6529 Occlusion and stenosis of unspecified carotid artery: Secondary | ICD-10-CM | POA: Diagnosis present

## 2024-07-05 DIAGNOSIS — F1110 Opioid abuse, uncomplicated: Secondary | ICD-10-CM

## 2024-07-05 DIAGNOSIS — I2510 Atherosclerotic heart disease of native coronary artery without angina pectoris: Secondary | ICD-10-CM | POA: Diagnosis present

## 2024-07-05 DIAGNOSIS — E782 Mixed hyperlipidemia: Secondary | ICD-10-CM | POA: Diagnosis present

## 2024-07-05 DIAGNOSIS — I951 Orthostatic hypotension: Secondary | ICD-10-CM | POA: Insufficient documentation

## 2024-07-05 DIAGNOSIS — Z8673 Personal history of transient ischemic attack (TIA), and cerebral infarction without residual deficits: Secondary | ICD-10-CM | POA: Insufficient documentation

## 2024-07-05 DIAGNOSIS — Z79899 Other long term (current) drug therapy: Secondary | ICD-10-CM | POA: Insufficient documentation

## 2024-07-05 DIAGNOSIS — I700 Atherosclerosis of aorta: Secondary | ICD-10-CM | POA: Insufficient documentation

## 2024-07-05 DIAGNOSIS — R7989 Other specified abnormal findings of blood chemistry: Secondary | ICD-10-CM | POA: Insufficient documentation

## 2024-07-05 MED ORDER — METOPROLOL SUCCINATE ER 25 MG PO TB24: 12.5000 mg | ORAL_TABLET | Freq: Every day | ORAL | 2 refills | Status: AC

## 2024-07-05 NOTE — Progress Notes (Unsigned)
 Cardiology Office Note:  .   Date:  06/07/2024 ID:  [name], DOB Feb 01, 1966, MRN [id number] PCP: Celestina Czar, MD   [facility] Providers Cardiologist:  Jayson Sierras, MD {  History of Present Illness: .   [name] is a 58 y.o. 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   Last seen by Dr. Sierras on November 21, 2020. Was referred for evaluation of chest discomfort. Lexiscan  Myoview  was obtained and was normal.   ED visit on February 18, 2023 for intermitent epigastric chest discomfort at [facility]. Described more CP at rest, described as burning sensation at center of his chest. CTA of chest revealed no acute PE with evidence of new small solid nodules medially in left lower lobe, hepatic steatosis. CXR showed patchy opacities in LLL. UDS positive for Marijuana and benzos. Trops negative.   Saw patient for follow-up on March 27, 2023.  He noted CP started one month ago. Described it as sharp/indigestion, right-sided, occurred more with exertion, lasted for a few minutes, and sat down and improved, noticed with working outside. Rates 7-8/10 when it occurs. Denied any shortness of breath, palpitations, syncope, presyncope, dizziness, orthopnea, PND, swelling or significant weight changes, acute bleeding, or claudication. Stated he is not on any oxycodone  or cocaine. Echo benign. NST revealed mild decreased activity along inferior wall, findings consistent with small area of mild ischemia.  Study was determined to be low risk.  07/03/2023 - Doing well. Denies any recurrent CP since last office visit. Denies any chest pain, shortness of breath, palpitations, syncope, presyncope, dizziness, orthopnea, PND, swelling or  significant weight changes, acute bleeding, or claudication. Tolerating medications well. Cannot tolerate statins.   01/01/2024 - Presents today for follow-up.  Tells me he experienced left-sided facial droopiness with numbness in March, also could not blink out of his left eye, symptoms lasted for about 2 weeks.  Says he was diagnosed with Bell's palsy. Says symptoms have improved. Denies any recent stroke-like symptoms.  He is scheduled to see Dr. Mona in June. Says when he bends over and stands up, he gets dizzy. Tells me he gets dizzy spells often. Denies any chest pain, shortness of breath, palpitations, syncope, presyncope, orthopnea, PND, swelling or significant weight changes, acute bleeding, or claudication.  04/01/2024  - patient comes in today for 43-month follow-up appointment.  Initial EKG gave preliminary report of acute STEMI with ST segment elevation in leads II, III and aVF.  Repeat EKG showed similar preliminary report with acute STEMI.  Upon entering patient room today, he does not appear well.  He is clammy, clutching his chest and describes chest pain that began at 1030 this morning.  He tells me he has had intermittent sharp chest pain for a while, but says he has never had chest pain this severe before.  He describes it as sharp, at the center of his chest, 10 out of 10 in intensity.  Tells me he feels short of breath with this.  Vital signs are all stable. Inpatient Cardiology was consulted along with DOD, Dr. Wilbert Bihari who is at [facility] today. Dr. Gordy Bergamo looked at EKG images and stated no STEMI, see below.  ED visit 04/01/2024 at [facility] for nonspecific chest pain, workup overall unremarkable. R/O for  ACS, trops negative.   04/21/2024 - Today he presents for ED follow-up. Doing better. Tells me he has had a few episodes of chest pain since I last saw him, but not as severe. Currently receiving Leqvio  infusions. Used to see Dr. Rachele in the past, does not see him anymore. Denies any  recent chest pain, shortness of breath, palpitations, syncope, presyncope, dizziness, orthopnea, PND, swelling or significant weight changes, acute bleeding, or claudication.  06/07/2024 -tells me about a recent ED visit last month for Bell's palsy, he notes left-sided facial droop.  He tells me he has had this before, currently receiving treatment for this. Denies any acute stroke like symptoms.  Tells me he has had a couple chest pain episodes since last office visit, says almost went to the ED for 1 episode but did not. Says episodes are similar to previous office visit on 04/01/2024. Denies any active chest pain today. Denies any shortness of breath, palpitations, syncope, presyncope, dizziness, orthopnea, PND, swelling or significant weight changes, acute bleeding, or claudication.  SH: Occasionally uses marijuana and smokes 1 PPD, trying to quit. Denies any alcohol use.   Studies Reviewed: [name]    EKG:      Carotid duplex 05/2023: Summary:  Right Carotid: Velocities in the right ICA are consistent with a 1-39%  stenosis. Non-hemodynamically significant plaque <50% noted in the  CCA. The ECA appears <50% stenosed.   Left Carotid: Velocities in the left ICA are consistent with a 1-39%  stenosis. Non-hemodynamically significant plaque <50% noted in the  CCA. The ECA appears <50% stenosed.   Vertebrals:  Bilateral vertebral arteries demonstrate antegrade flow.  Subclavians: Normal flow hemodynamics were seen in bilateral subclavian arteries.  Lexiscan  04/2023:   Lexiscan  stress without EKG changes   Myoview  scan stress images show mild decreased activity in the inferior wall (mid/distal). In the recovery images there was improvement in this region consistent with a small area of mild ischemia. When compared to scan from 2019, subtle thinning present in previous study  but in both the  rest and the stress images.   Note, motion during study may interfere with accuracy.   LVEF calculated at 70%  with normal wall motion   Overall low risk study  Echo 03/2023: 1. Left ventricular ejection fraction, by estimation, is 60 to 65%. The  left ventricle has normal function. The left ventricle has no regional  wall motion abnormalities. There is mild left ventricular hypertrophy.  Left ventricular diastolic parameters  are consistent with Grade I diastolic dysfunction (impaired relaxation).   2. Right ventricular systolic function is normal. The right ventricular  size is normal. Tricuspid regurgitation signal is inadequate for assessing  PA pressure.   3. The mitral valve is normal in structure. No evidence of mitral valve  regurgitation. No evidence of mitral stenosis.   4. The aortic valve is tricuspid. Aortic valve regurgitation is not  visualized. No aortic stenosis is present.   5. The inferior vena cava is normal in size with greater than 50%  respiratory variability, suggesting right atrial pressure of 3 mmHg.   Comparison(s): No significant change from prior study.  Lexiscan  11/2020:  No diagnostic ST segment changes to indicate ischemia. No significant myocardial perfusion defects on stress imaging to indicate scar or ischemia. There is diaphragmatic attenuation noted, mainly on the rest images. This is a low risk study. Nuclear stress EF: 63%.   Physical Exam:   VS:  There were no vitals taken for this visit.  Wt Readings from Last 3 Encounters:  06/27/24 219 lb 12.8 oz (99.7 kg)  06/22/24 212 lb (96.2 kg)  06/15/24 212 lb 12.8 oz (96.5 kg)    GEN: Obese, 58 y.o. male in no acute distress NECK: No JVD; No carotid bruits CARDIAC: S1/S2, RRR, no murmurs, rubs, gallops RESPIRATORY:  Clear to auscultation without rales, wheezing or rhonchi  ABDOMEN: Soft, non-tender, non-distended EXTREMITIES:  No edema; No deformity  NEURO: A&O x 4, able to follow commands, clear speech, left side facial droop, strength and sensation normal throughout.   ASSESSMENT AND PLAN: .     Coronary atherosclerosis, chest pain of uncertain etiology, and aortic atherosclerosis Does admit to some chest pain episodes since last office visit. EKG today is reassuring. Cannot tolerate Aspirin . Cannot tolerate statins or Zetia  - have added to his allergy list.  Currently receiving Leqvio .  Tolerating Imdur  well, no current room to uptitrate this based on BP readings.  Will obtain BMET and magnesium to see if these levels are WNL to begin Ranexa. No other medication changes at this time. Heart healthy diet and regular cardiovascular exercise encouraged. Care and ED precautions discussed.     2. HTN, orthostatic hypotension, orthostatic dizziness, medication management BP stable today. SBP goal < 130.  Denies any recent orthostatic symptoms recently. Discussed conservative measures to help improve his symptoms. Discussed to monitor BP at home at least 2 hours after medications and sitting for 5-10 minutes.  Instructed him to stop hydrochlorothiazide  and will obtain BMET in 1 week.    Amlodipine  caused lower extremity swelling, lisinopril caused AKI from what it sounds like.... beta blocker?   3. Mixed HLD Hx of multiple statin intolerances and could not tolerate Zetia .  He is receiving Leqvio  infusions. Continue to follow-up with Dr. Mona.  At next office visit, consider obtaining FLP to recheck levels.   4. Hx of CVA, carotid artery stenosis Denies any symptoms. Continue to follow with PCP.  Cannot tolerate low dose Aspirin , unable to tolerate statins. Currently receiving Leqvio  infusions. Heart healthy diet and regular cardiovascular exercise encouraged.    5. Polysubstance abuse Substance abuse cessation encouraged and discussed. He verbalized understanding.   6. AKI Noted on most recent labs with scR at 1.63 with eGFR 49.  Instructed him to stop hydrochlorothiazide  and will recheck BMET in 1 week. Avoid nephrotoxic agents.  Continue follow-up with PCP.  7. Bell's palsy Noted today  on exam with some left-sided droop, currently receiving treatment for this. No pronator drift or any other concern for acute stroke.  Recommend to follow-up with PCP as soon as possible for follow-up.  I spent a total duration of 30 minutes reviewing prior notes, reviewing outside records including  labs, EKG today, face-to-face counseling of medical condition, pathophysiology, evaluation, management, and documenting the findings in the note.   Dispo: Follow-up with Dr. Debera or APP in 4-6 weeks or sooner if anything changes.   Signed, Almarie Crate, NP

## 2024-07-05 NOTE — Patient Instructions (Signed)
 Medication Instructions:  Your physician has recommended you make the following change in your medication:  Start taking Metoprolol Succinate 12.5 mg once daily  Continue taking all other medications as prescribed   Labwork: BMET in 2-3 weeks at [facility]   Testing/Procedures: None  Follow-Up: Your physician recommends that you schedule a follow-up appointment in: 6-8 weeks  Any Other Special Instructions Will Be Listed Below (If Applicable). Thank you for choosing [facility]!     If you need a refill on your cardiac medications before your next appointment, please call your pharmacy.

## 2024-07-06 ENCOUNTER — Telehealth: Payer: Self-pay | Admitting: Student

## 2024-07-06 MED ORDER — OXYCODONE-ACETAMINOPHEN 5-325 MG PO TABS: 1.0000 | ORAL_TABLET | Freq: Four times a day (QID) | ORAL | 0 refills | Status: DC | PRN

## 2024-07-06 MED ORDER — BUPRENORPHINE HCL-NALOXONE HCL 8-2 MG SL FILM: ORAL_FILM | SUBLINGUAL | 0 refills | Status: AC

## 2024-07-06 NOTE — Telephone Encounter (Signed)
 Copied from CRM [id number]. Topic: Clinical - Prescription Issue >> Jul 06, 2024  2:38 PM Mercer PEDLAR wrote: Reason for CRM: Shival calling from [facility] regarding oxyCODONE -acetaminophen  (PERCOCET/ROXICET) 5-325 MG tablet and Buprenorphine  HCl-Naloxone  HCl (SUBOXONE ) 8-2 MG FILM. He stated that these medication are causing an error when prescribed together and clarification is needed.   Callback: [phone]

## 2024-07-06 NOTE — Telephone Encounter (Signed)
Oxycodone has been refilled.

## 2024-07-06 NOTE — Telephone Encounter (Signed)
 Copied from CRM [id number]. Topic: Clinical - Prescription Issue >> Jul 06, 2024 12:34 PM DeAngela L wrote: Reason for CRM: patient received a call from [facility] asking if you could  call the providers office and ask if another doctor can sign off on the refill since the provider is not approved under medicaid yet    Buprenorphine  HCl-Naloxone  HCl (SUBOXONE ) 8-2 MG FILM oxyCODONE -acetaminophen  (PERCOCET/ROXICET) 5-325 MG tablet   Pt num [phone] (M)   [id number] [place], [place] - [address] [facility] [address] [facility] [address] [place] [address] Phone: [phone] Fax: [phone]

## 2024-07-06 NOTE — Telephone Encounter (Signed)
 Pt stated he needs a refill on Oxycodone  for his h/a's; stated he had mentioned this to Dr Celestina at [facility] (10/20).

## 2024-07-07 NOTE — Progress Notes (Unsigned)
 Patient ID: [name], male   DOB: Apr 05, 1966, 58 y.o.   MRN: [id number]  Reason for Consult: No chief complaint on file.   Referred by Celestina Czar, MD  Subjective:     HPI [name] is a 58 y.o. male presenting for evaluation of giant cell arteritis.  He was seen by his primary care on 10/6 and was started on a prednisone  taper due to concern for giant cell arteritis.  He was having eye pain and swelling for 3 weeks at that time.  He has been on a prednisone  taper since. ***  Past Medical History:  Diagnosis Date   Acute pancreatitis    Anxiety    Arthritis    Colonic adenoma    COPD (chronic obstructive pulmonary disease) (HCC)    Depression    Diverticulitis of colon with perforation 08/2010   GERD (gastroesophageal reflux disease)    History of hiatal hernia    Hypertension    Polysubstance abuse (HCC)    Renal insufficiency    Sleep apnea    Intolerant of CPAP   Stroke (HCC) 2021   TIA (transient ischemic attack)    Family History  Problem Relation Age of Onset   Stomach cancer Father    Lung cancer Father    Throat cancer Father    Cancer Father        lung and throat cancer   Hypertension Father    Liver disease Cousin        cirrhosis, etoh   Throat cancer Paternal Uncle    Lung cancer Paternal Uncle    Colon cancer Cousin 52   Heart disease Mother    Hypertension Mother    Colon cancer Maternal Uncle    Throat cancer Paternal Uncle    Lung cancer Paternal Uncle    Throat cancer Paternal Uncle    Lung cancer Paternal Uncle    Past Surgical History:  Procedure Laterality Date   APPENDECTOMY     BALLOON DILATION N/A 08/04/2022   Procedure: BALLOON DILATION;  Surgeon: Cindie Carlin POUR, DO;  [facility];  Service: Endoscopy;  Laterality: N/A;   BIOPSY  12/14/2017   Procedure: BIOPSY;  Surgeon: Shaaron [name], MD;  [facility];  Service: Endoscopy;;  gastric    BIOPSY  10/13/2019   Procedure: BIOPSY;   Surgeon: Shaaron [name], MD;  [facility];  Service: Endoscopy;;  esophagus   BIOPSY  05/20/2021   Procedure: BIOPSY;  Surgeon: Shaaron [name], MD;  [facility];  Service: Endoscopy;;   BIOPSY  12/18/2022   Procedure: BIOPSY;  Surgeon: Shaaron [name], MD;  [facility];  Service: Endoscopy;;   clamp left in colon after abd surgery     COLON SURGERY  05/2011   [facility]. exp laparotomy with colostomy takedown, lysis of adhesions (3hours), loop ileostomy. complicated by abscess formation requiring percutaneous drainage   colonoscopy via stoma with endoscopy of [place]  04/2011   [facility]. two sessile polyps in trv colon. complete resection but only partial retrieval. congested mucosa in area 3cm proximal to stoma, erythematous mucosa in Hartmann pouch. repeat tcs 8/2017path-adenomatous polyp   COLONOSCOPY WITH PROPOFOL  N/A 12/14/2017   Dr. Shaaron: 2 4 to 6 mm polyps in the cecum, tubular adenomas, single rectal polyp tubular adenoma, nonbleeding internal hemorrhoids.  Appears to be a surgical clip at the appendiceal orifice.  Next colonoscopy in 5 years   COLONOSCOPY  WITH PROPOFOL  N/A 12/18/2022   Procedure: COLONOSCOPY WITH PROPOFOL ;  Surgeon: Shaaron [name], MD;  [facility];  Service: Endoscopy;  Laterality: N/A;  8:45 am   ESOPHAGEAL DILATION N/A 06/22/2024   Procedure: DILATION, ESOPHAGUS;  Surgeon: Shaaron [name], MD;  [facility];  Service: Endoscopy;  Laterality: N/A;   ESOPHAGEAL MANOMETRY N/A 12/11/2021   Procedure: ESOPHAGEAL MANOMETRY (EM);  Surgeon: Shila Gustav GAILS, MD;  [facility];  Service: Endoscopy;  Laterality: N/A;   ESOPHAGOGASTRODUODENOSCOPY  03/05/2012   erosive reflux esophagitis, bulbar erosions, gastric erosions/petichae, bx showed gastritis, no H.Pylori. Procedure: ESOPHAGOGASTRODUODENOSCOPY (EGD);  Surgeon: [name] Shaaron, MD;  [facility];  Service: Endoscopy;  Laterality: N/A;   11:30   ESOPHAGOGASTRODUODENOSCOPY N/A 06/22/2024   Procedure: EGD (ESOPHAGOGASTRODUODENOSCOPY);  Surgeon: Shaaron [name], MD;  [facility];  Service: Endoscopy;  Laterality: N/A;  11:00am, asa 3   ESOPHAGOGASTRODUODENOSCOPY (EGD) WITH PROPOFOL  N/A 12/14/2017   Dr. Shaaron: Erosive reflux esophagitis with questionable mild stricturing/non-obstruction at this level status post dilation.  multiple gastric erosions in the stomach biopsy showed reactive gastropathy but no H. pylori.   ESOPHAGOGASTRODUODENOSCOPY (EGD) WITH PROPOFOL  N/A 10/13/2019   negative Barrett's, normal stomach   ESOPHAGOGASTRODUODENOSCOPY (EGD) WITH PROPOFOL  N/A 05/20/2021   inlet patch with superimposed nodule s/p biopsy. Normal esophagus s/p dilation. Normal gastric mucosa. Normal duodenum. Reflux changes on path.   ESOPHAGOGASTRODUODENOSCOPY (EGD) WITH PROPOFOL  N/A 08/04/2022   Procedure: ESOPHAGOGASTRODUODENOSCOPY (EGD) WITH PROPOFOL ;  Surgeon: Cindie Carlin POUR, DO;  [facility];  Service: Endoscopy;  Laterality: N/A;  1:15 pm   ESOPHAGOGASTRODUODENOSCOPY (EGD) WITH PROPOFOL  N/A 12/18/2022   Procedure: ESOPHAGOGASTRODUODENOSCOPY (EGD) WITH PROPOFOL ;  Surgeon: Shaaron [name], MD;  [facility];  Service: Endoscopy;  Laterality: N/A;   FOOT SURGERY Left    strethching of ligaments   HERNIA REPAIR     umbilical   KNEE SURGERY Right 2006   MALONEY DILATION N/A 12/14/2017   Procedure: [name] DILATION;  Surgeon: Shaaron [name], MD;  [facility];  Service: Endoscopy;  Laterality: N/A;   MALONEY DILATION  05/20/2021   Procedure: [name] DILATION;  Surgeon: Shaaron [name], MD;  [facility];  Service: Endoscopy;;   MALONEY DILATION N/A 12/18/2022   Procedure: [name] HODGKIN;  Surgeon: Shaaron [name], MD;  [facility];  Service: Endoscopy;  Laterality: N/A;   MULTIPLE EXTRACTIONS WITH ALVEOLOPLASTY  02/23/2012   Procedure: MULTIPLE EXTRACION WITH  ALVEOLOPLASTY;  Surgeon: Glendia HERO Primrose, DDS;  Location: [facility];  Service: Oral Surgery;  Laterality: Bilateral;  Removal of left mandibular torus, multiple extractions with alveoloplasty   POLYPECTOMY  12/14/2017   Procedure: POLYPECTOMY;  Surgeon: Shaaron [name], MD;  [facility];  Service: Endoscopy;;   POLYPECTOMY  12/18/2022   Procedure: POLYPECTOMY;  Surgeon: Shaaron [name], MD;  [facility];  Service: Endoscopy;;   reversal of colostomy  10/2011   per patient, did not have report   right knee surgery     sigmoid colectomy with end colostomy, Hartmann's pouch  08/2010   [facility]. complicated diverticulitis   UMBILICAL HERNIA REPAIR      Short Social History:  Social History   Tobacco Use   Smoking status: Every Day    Current packs/day: 1.00    Average packs/day: 1 pack/day for 37.0 years (37.0 ttl pk-yrs)    Types: Cigarettes    Passive exposure: Current  Smokeless tobacco: Former   Tobacco comments:    Trying to quit, has nicotine  patches at home which have helped him quit before.  Smokes a little less than a pack a day.  Updated 07/13/2023.  Substance Use Topics   Alcohol use: Not Currently    Allergies  Allergen Reactions   Aspirin  Other (See Comments)    Makes me feel bad.   Codeine Itching and Nausea And Vomiting   Latex Other (See Comments)    Turns skin red.    Morphine Hives   Statins Other (See Comments)    Joint pain   Zestril [Lisinopril]     Caused Kidney failure   Zetia  [Ezetimibe ] Other (See Comments)    Joint pain   Adhesive [Tape] Rash    Current Outpatient Medications  Medication Sig Dispense Refill   albuterol  (VENTOLIN  HFA) 108 (90 Base) MCG/ACT inhaler Inhale 1 puff into the lungs every 4 (four) hours as needed for wheezing or shortness of breath. 18 each 2   ARIPiprazole (ABILIFY) 5 MG tablet Take 1 tablet (5 mg total) by mouth daily. 30 tablet 1   Blood Pressure Monitor DEVI Use as directed. 1 each 0    Buprenorphine  HCl-Naloxone  HCl (SUBOXONE ) 8-2 MG FILM PLACE 1 STRIPS UNDER THE TONGUE TWICE DAILY 60 Film 0   busPIRone  (BUSPAR ) 7.5 MG tablet Take 1 tablet (7.5 mg total) by mouth 3 (three) times daily. 90 tablet 1   cycloSPORINE (RESTASIS) 0.05 % ophthalmic emulsion Place 1 drop into both eyes 2 (two) times daily.     fluticasone  (FLONASE ) 50 MCG/ACT nasal spray SPRAY 2 SPRAYS INTO EACH NOSTRIL EVERY DAY (Patient taking differently: 2 sprays as needed.) 48 mL 1   hydrocortisone  (ANUSOL -HC) 2.5 % rectal cream Place 1 Application rectally 2 (two) times daily as needed for hemorrhoids. 28 g 2   hydrOXYzine  (ATARAX ) 10 MG tablet Take 1 tablet (10 mg total) by mouth 3 (three) times daily as needed. (Patient not taking: Reported on 07/05/2024) 30 tablet 0   inclisiran (LEQVIO ) 284 MG/1.5ML SOSY injection Inject 284 mg into the skin once.     isosorbide  mononitrate (IMDUR ) 30 MG 24 hr tablet Take 30 mg by mouth 2 (two) times daily.     metoprolol succinate (TOPROL XL) 25 MG 24 hr tablet Take 0.5 tablets (12.5 mg total) by mouth daily. 45 tablet 2   mirtazapine (REMERON) 7.5 MG tablet Take 1 tablet (7.5 mg total) by mouth at bedtime. (Patient not taking: Reported on 07/05/2024) 30 tablet 0   oxyCODONE -acetaminophen  (PERCOCET/ROXICET) 5-325 MG tablet Take 1 tablet by mouth every 6 (six) hours as needed for severe pain (pain score 7-10). 15 tablet 0   pantoprazole  (PROTONIX ) 40 MG tablet TAKE 1 TABLET BY MOUTH TWICE A DAY 60 tablet 2   pregabalin  (LYRICA ) 75 MG capsule Take 1 capsule (75 mg total) by mouth 2 (two) times daily. (Patient not taking: Reported on 07/05/2024) 60 capsule 9   spironolactone  (ALDACTONE ) 25 MG tablet Take 1 tablet (25 mg total) by mouth daily. 30 tablet 3   Current Facility-Administered Medications  Medication Dose Route Frequency Provider Last Rate Last Admin   triamcinolone  acetonide (KENALOG -40) injection 20 mg  20 mg Other Once Janit Thresa HERO, DPM        REVIEW OF SYSTEMS   All other systems were reviewed and are negative     Objective:  Objective   There were no vitals filed for this visit. There is no height or weight  on file to calculate BMI.  Physical Exam General: no acute distress Cardiac: hemodynamically stable Abdomen: non-tender, no pulsatile mass*** Extremities: no edema, cyanosis or wounds*** Vascular:   Right: ***  Left: ***  Data: Carotid duplex Right Carotid Findings:  +----------+--------+--------+--------+--------------------------+--------+            PSV cm/sEDV cm/sStenosisPlaque Description         Comments  +----------+--------+--------+--------+--------------------------+--------+   CCA Prox  46      18              heterogenous and irregular           +----------+--------+--------+--------+--------------------------+--------+   CCA Distal46      15                                                   +----------+--------+--------+--------+--------------------------+--------+   ICA Prox  60      25      1-39%   heterogenous and irregular           +----------+--------+--------+--------+--------------------------+--------+   ICA Mid   58      28              heterogenous and irregular           +----------+--------+--------+--------+--------------------------+--------+   ICA Distal50      22              heterogenous and irregular           +----------+--------+--------+--------+--------------------------+--------+   ECA      67                      heterogenous and irregular           +----------+--------+--------+--------+--------------------------+--------+    +----------+--------+-------+----------------+-------------------+           PSV cm/sEDV cmsDescribe        Arm Pressure (mmHG)  +----------+--------+-------+----------------+-------------------+  [id number]            Multiphasic, [id number]                   +----------+--------+-------+----------------+-------------------+   +---------+--------+--+--------+---------+  VertebralPSV cm/s36EDV cm/sAntegrade  +---------+--------+--+--------+---------+     Left Carotid Findings:  +----------+--------+--------+--------+--------------------------+--------+            PSV cm/sEDV cm/sStenosisPlaque Description         Comments  +----------+--------+--------+--------+--------------------------+--------+   CCA Prox  61      20              heterogenous and irregular           +----------+--------+--------+--------+--------------------------+--------+   CCA Distal59      21                                                   +----------+--------+--------+--------+--------------------------+--------+   ICA Prox  71      34      1-39%   heterogenous and irregular           +----------+--------+--------+--------+--------------------------+--------+   ICA Mid   64      31              heterogenous and irregular           +----------+--------+--------+--------+--------------------------+--------+  ICA Distal52      26              heterogenous and irregular           +----------+--------+--------+--------+--------------------------+--------+   ECA      50                                                           +----------+--------+--------+--------+--------------------------+--------+    +----------+--------+--------+----------------+-------------------+           PSV cm/sEDV cm/sDescribe        Arm Pressure (mmHG)  +----------+--------+--------+----------------+-------------------+  [id number]             Multiphasic, TWO855                  +----------+--------+--------+----------------+-------------------+   +---------+--------+--+--------+---------+  VertebralPSV cm/s27EDV cm/sAntegrade  +---------+--------+--+--------+---------+   CMP reviewed, creatinine  1.31   ESR mildly elevated at 41 CRP mildly elevated at 15     Assessment/Plan:   [name] is a 58 y.o. male with eye pain and swelling.  There was concern for giant cell arteritis when he first presented to his primary care earlier in October.  At that time he did have some vision changes and tenderness over the temporal artery.  ESR and CRP are currently mildly elevated.  He has been on a prednisone  taper. I discussed the risks and benefits of temporal artery biopsy.  I explained that he has already been on steroids for almost a month at this point and also discussed that the test has a fairly high rate of false positives and false negatives especially when someone has been on steroid treatment for a few minutes. I also discussed that this would not change treatment and it would just be to potentially confirm a diagnosis.  I also explained that even if the biopsy is negative I would recommend continuing with steroid taper until completed given the high rate of false negatives. After this discussion he elected to just continue with treatment and defer temporal artery biopsy.  Follow up PRN   Norman GORMAN Serve MD [facility]

## 2024-07-08 ENCOUNTER — Encounter: Payer: Self-pay | Admitting: Vascular Surgery

## 2024-07-08 ENCOUNTER — Ambulatory Visit: Attending: Vascular Surgery | Admitting: Vascular Surgery

## 2024-07-08 VITALS — BP 130/93 | HR 68 | Temp 98.2°F | Resp 20 | Ht 67.0 in | Wt 216.1 lb

## 2024-07-08 DIAGNOSIS — G510 Bell's palsy: Secondary | ICD-10-CM | POA: Diagnosis not present

## 2024-07-08 NOTE — Telephone Encounter (Signed)
 Spoke with Dr. Pearlie is not to be on the Oxycodone  and will cancel prescription that was sent in.  Patient is to only receive the Suboxone .  Call to [facility] spoke with Pharmacist Shival informed him that the Oxycodone  prescription has been cancelled. Pharmacist to inform patient when he comes to pick up prescription.

## 2024-07-08 NOTE — Telephone Encounter (Signed)
 RTC to Pharmacy stated that the Suboxone  is to  get him off the Oxycodone  and the patient has a prescription for the Oxycodone .as well.   Insurance is putting an alert message to prevent the interaction.  Informed pharmacy that Dr. Koomson will correct the problem and will call the patient.  Attempts to reach patient VM message is that the box is full.  Plan was to inform patient that Dr. Celestina has been trying to call him about his meds.

## 2024-07-08 NOTE — Addendum Note (Signed)
 Addended by: CELESTINA CZAR on: 07/08/2024 11:34 AM   Modules accepted: Orders

## 2024-07-08 NOTE — Telephone Encounter (Signed)
 Call from patient stating that the [facility] will not fill [facility] for the Oxycodone -Acetaminophen  and the Suboxone .

## 2024-07-11 ENCOUNTER — Ambulatory Visit: Admitting: Student

## 2024-07-11 NOTE — Telephone Encounter (Signed)
 Call from pt-states he was unable to get his oxycodone  and suboxone  refills Pt informed that rx for oxycodone  was canceled by his pcp and suboxone  rx is ready and waiting for pickup (confirmed with pharmacy). Per pharmacy, the rx for both oxycodone  and suboxone  wasn't going through, but once the oxy rx was canceled, it was no problem for the suboxone  rx.  No further action needed. Phone call complete.Cheyenne Bordeaux Cassady11/3/20254:18 PM

## 2024-07-12 LAB — OPHTHALMOLOGY REPORT-SCANNED

## 2024-07-14 ENCOUNTER — Ambulatory Visit: Admitting: Student

## 2024-07-14 ENCOUNTER — Telehealth: Admitting: Student

## 2024-07-14 ENCOUNTER — Telehealth (HOSPITAL_COMMUNITY): Admitting: Registered Nurse

## 2024-07-14 DIAGNOSIS — M316 Other giant cell arteritis: Secondary | ICD-10-CM

## 2024-07-14 DIAGNOSIS — S83511D Sprain of anterior cruciate ligament of right knee, subsequent encounter: Secondary | ICD-10-CM | POA: Diagnosis not present

## 2024-07-14 DIAGNOSIS — S83241D Other tear of medial meniscus, current injury, right knee, subsequent encounter: Secondary | ICD-10-CM | POA: Diagnosis not present

## 2024-07-14 DIAGNOSIS — M2391 Unspecified internal derangement of right knee: Secondary | ICD-10-CM | POA: Diagnosis not present

## 2024-07-14 MED ORDER — PREDNISONE 5 MG PO TABS: ORAL_TABLET | ORAL | 0 refills | Status: AC

## 2024-07-14 NOTE — Progress Notes (Signed)
   CC: Follow-up on eye pain.  This is a telephone encounter between BILAL MANZER and Evolette Pendell on 07/14/2024 for follow-up on eye pain. The visit was conducted with the patient located at home and Missy Sandhoff at [facility]. The patient's identity was confirmed using their DOB and current address. The patient has consented to being evaluated through a telephone encounter and understands the associated risks (an examination cannot be done and the patient may need to come in for an appointment) / benefits (allows the patient to remain at home, decreasing exposure to coronavirus). I personally spent 15 minutes on medical discussion.   HPI:  JasonJason Oneal is a 58 y.o. with PMH as below.   Please see A&P for assessment of the patient's acute and chronic medical conditions.   Past Medical History:  Diagnosis Date   Acute pancreatitis    Anxiety    Arthritis    Colonic adenoma    COPD (chronic obstructive pulmonary disease) (HCC)    Depression    Diverticulitis of colon with perforation 08/2010   GERD (gastroesophageal reflux disease)    History of hiatal hernia    Hypertension    Polysubstance abuse (HCC)    Renal insufficiency    Sleep apnea    Intolerant of CPAP   Stroke (HCC) 2021   TIA (transient ischemic attack)    Review of Systems: Negative unless stated in HPI    Assessment & Plan:   No problem-specific Assessment & Plan notes found for this encounter.   Concern for GCA Patient follows with vascular surgery; temporal artery biopsy was deferred per patient preference to pursue medical management. Definitive treatment remains corticosteroid therapy. He was empirically started on high-dose prednisone  (60 mg daily) on 10/6 but self-tapered to 20 mg after a few days due to feeling jittery. Reports no significant improvement in symptoms and denies worsening vision or new symptoms. He has followed with his ophthalmologist, who noted decreased vision in the left eye.  -  Given inconsistent steroid use, will initiate a rapid prednisone  taper: 10 mg daily  5 days, then 5 mg daily  5 days. - Patient counseled on the importance of not discontinuing steroids abruptly due to risk of adrenal insufficiency. -Advised to follow up with ophthalmology for ongoing visual symptoms and with vascular surgery as scheduled.   Patient discussed with Dr. CHARLENA Rosan Missy Sandhoff, MD  Internal Medicine Resident

## 2024-07-18 ENCOUNTER — Telehealth (HOSPITAL_COMMUNITY): Admitting: Registered Nurse

## 2024-07-20 ENCOUNTER — Encounter: Attending: Internal Medicine | Admitting: *Deleted

## 2024-07-20 VITALS — BP 136/85 | HR 82 | Temp 97.6°F | Resp 20

## 2024-07-20 DIAGNOSIS — E785 Hyperlipidemia, unspecified: Secondary | ICD-10-CM | POA: Insufficient documentation

## 2024-07-20 MED ORDER — INCLISIRAN SODIUM 284 MG/1.5ML ~~LOC~~ SOSY
284.0000 mg | PREFILLED_SYRINGE | Freq: Once | SUBCUTANEOUS | Status: AC
  Administered 2024-07-20: 284 mg via SUBCUTANEOUS

## 2024-07-20 NOTE — Progress Notes (Signed)
 Diagnosis: Hyperlipidemia  Provider:  Vinie Maxcy, MD  Procedure: Injection  Leqvio  (inclisiran), Dose: 284 mg, Site: subcutaneous, Number of injections: 1  Injection Site(s): Right arm  Post Care: Observation period completed  Discharge: Condition: Good, Destination: Home . AVS Declined  Performed by:  Baldwin Darice Helling, RN

## 2024-07-21 ENCOUNTER — Encounter: Payer: Self-pay | Admitting: Internal Medicine

## 2024-07-25 ENCOUNTER — Ambulatory Visit (INDEPENDENT_AMBULATORY_CARE_PROVIDER_SITE_OTHER): Admitting: Internal Medicine

## 2024-07-25 ENCOUNTER — Encounter: Payer: Self-pay | Admitting: Internal Medicine

## 2024-07-25 VITALS — BP 160/104 | HR 68 | Temp 97.4°F | Ht 67.0 in | Wt 211.0 lb

## 2024-07-25 DIAGNOSIS — F1111 Opioid abuse, in remission: Secondary | ICD-10-CM | POA: Diagnosis not present

## 2024-07-25 DIAGNOSIS — R519 Headache, unspecified: Secondary | ICD-10-CM | POA: Diagnosis present

## 2024-07-25 DIAGNOSIS — G510 Bell's palsy: Secondary | ICD-10-CM

## 2024-07-25 NOTE — Patient Instructions (Signed)
 We can start to try to wean you off suboxone .  I want you to decrease by half a film every 2 weeks.  For the next 2 weeks I want you to take one full film in the morning and half a film at night.  Then you can take half a film in the morning and at night for 2 weeks, the last 2 weeks take only a half a film once a day.  We will see you back in about 6 weeks.

## 2024-07-25 NOTE — Progress Notes (Signed)
 Internal Medicine Clinic Attending  Case discussed with the resident at the time of the visit.  We reviewed the resident's history and exam and pertinent patient test results.  I agree with the assessment, diagnosis, and plan of care documented in the resident's note.

## 2024-07-26 ENCOUNTER — Telehealth (HOSPITAL_COMMUNITY): Payer: Self-pay | Admitting: *Deleted

## 2024-07-26 NOTE — Telephone Encounter (Signed)
 [facility] is requesting 90 days supply refills of patient Aripiprazole 5mg . Patient medication was last filled 06/20/2024. Pharmacy is needing a quantity of 90 to be sent to them for them to fill patient medication.

## 2024-07-28 NOTE — Telephone Encounter (Signed)
 noted

## 2024-07-29 NOTE — Assessment & Plan Note (Addendum)
 Having left sided headaches, picture complicated by Bells palsy and some concern for Temporal arterities but prescribed steroids limited full Dx. (Further complicated by inconsistent adherence with steroid treatment Rx).  Will try to get him in with Neurology for evaluation.  Orders:   Ambulatory referral to Neurology

## 2024-07-29 NOTE — Assessment & Plan Note (Addendum)
 Discussed plan to taper Suboxone  by 4mg  (half a film) every 2 weeks.  Will see back in 6 weeks.

## 2024-07-29 NOTE — Progress Notes (Signed)
 Subjective:  HPI: Chief Complaint  Patient presents with   Discuss pain management    Pt wants to get off of Suboxone  strips.   [name] is here to discuss weaning off suboxone .  [name] has a recorded history of mild OUD but with significant back and neck pain.  [name] was started on suboxone  back in 2021 given his pain and a concerning history of taking non prescribed opioids and other illicit substances.  [name] feels that the suboxone  is no longer helping with his chronic pain and [name] has no cravings to use illicit opioids to self medicate.  Additionally [name] reports that [name] has been having headaches.  [name] was diagnosed with Bell's palsy in September in the emergency department and started on some steroids [name] has followed up a few times for this in our clinic.  Initially a sed rate was checked and there was some concern for GCA [name] was referred to rheumatology and ophthalmology but not seen.  Subsequent visits noted inconsistent adherence to prednisone  and we referred him over to vascular surgery where [name] was evaluated but deemed a poor candidate for temporal artery biopsy due to having been on prednisone  for some time.  [name] has not had any significant change in his symptoms after some mild initial improvement of his left-sided facial weakness in September.  Left facial weakness has been grossly unchanged.  [name] notes that [name] has had headaches since about September these were unchanged by prednisone  use have not been worsening not associated with chewing.    Please see Assessment and Plan below for the status of his chronic medical problems.  Objective:  Physical Exam: Vitals:   07/25/24 1107 07/25/24 1141  BP: (!) 151/91 (!) 160/104  Pulse: 73 68  Temp: (!) 97.4 F (36.3 C)   TempSrc: Oral   SpO2: 93%   Weight: 211 lb (95.7 kg)   Height: 5' 7 (1.702 m)    Body mass index is 33.05 kg/m. Physical Exam Constitutional:      Appearance: Normal appearance.  Eyes:     Extraocular Movements:  Extraocular movements intact.     Pupils: Pupils are equal, round, and reactive to light.  Pulmonary:     Effort: Pulmonary effort is normal.     Breath sounds: Normal breath sounds.  Neurological:     Mental Status: [name] is alert.     Comments: Slight left sided facial droop, left eyelid drop, able to partially close eyelid.  No TA tenderness.  Psychiatric:        Mood and Affect: Mood normal.        Behavior: Behavior normal.    Results  No results found for any visits on 07/25/24.  The ASCVD Risk score (Arnett DK, et al., 2019) failed to calculate for the following reasons:   Risk score cannot be calculated because patient has a medical history suggesting prior/existing ASCVD  Assessment & Plan:  See Encounters Tab for problem based charting. Assessment & Plan Intractable headache, unspecified chronicity pattern, unspecified headache type Having left sided headaches, picture complicated by Bells palsy and some concern for Temporal arterities but prescribed steroids limited full Dx. (Further complicated by inconsistent adherence with steroid treatment Rx).  Will try to get him in with Neurology for evaluation.  Orders:   Ambulatory referral to Neurology  Bell's palsy Has had partial improvement, Discussed it may take months, given HA will refer over to Neurology.     Opioid use disorder, mild, in sustained remission,  on maintenance therapy, abuse (HCC) Discussed plan to taper Suboxone  by 4mg  (half a film) every 2 weeks.  Will see back in 6 weeks.       Medications Ordered No orders of the defined types were placed in this encounter.  Other Orders Orders Placed This Encounter  Procedures   Ambulatory referral to Neurology    Referral Priority:   Routine    Referral Type:   Consultation    Referral Reason:   Specialty Services Required    Requested Specialty:   Neurology    Number of Visits Requested:   1   Follow Up: Return in about 6 weeks (around 09/05/2024).

## 2024-07-29 NOTE — Assessment & Plan Note (Addendum)
 Has had partial improvement, Discussed it may take months, given HA will refer over to Neurology.

## 2024-07-30 ENCOUNTER — Other Ambulatory Visit: Payer: Self-pay | Admitting: Gastroenterology

## 2024-07-30 DIAGNOSIS — K219 Gastro-esophageal reflux disease without esophagitis: Secondary | ICD-10-CM

## 2024-08-01 ENCOUNTER — Encounter (HOSPITAL_COMMUNITY): Payer: Self-pay | Admitting: Registered Nurse

## 2024-08-01 ENCOUNTER — Ambulatory Visit (HOSPITAL_COMMUNITY): Admitting: Registered Nurse

## 2024-08-01 DIAGNOSIS — F411 Generalized anxiety disorder: Secondary | ICD-10-CM | POA: Diagnosis not present

## 2024-08-01 DIAGNOSIS — F332 Major depressive disorder, recurrent severe without psychotic features: Secondary | ICD-10-CM | POA: Diagnosis not present

## 2024-08-01 MED ORDER — BUSPIRONE HCL 10 MG PO TABS: 10.0000 mg | ORAL_TABLET | Freq: Three times a day (TID) | ORAL | 1 refills | Status: DC

## 2024-08-01 MED ORDER — VALERIAN ROOT 250 MG PO CAPS: ORAL_CAPSULE | ORAL | Status: AC

## 2024-08-01 MED ORDER — ARIPIPRAZOLE 5 MG PO TABS: 5.0000 mg | ORAL_TABLET | Freq: Every day | ORAL | 1 refills | Status: AC

## 2024-08-01 NOTE — Patient Instructions (Signed)

## 2024-08-01 NOTE — Progress Notes (Signed)
 [facility] MD/PA/NP OP Progress Note  08/01/2024 4:25 PM [name]  MRN:  [id number]  Chief Complaint:  Chief Complaint  Patient presents with   Follow-up    Medication manage   HPI: [name] 58 y.o. male presents today for medication management follow up.  He was seen face-to-face by this provider and chart reviewed on 08/01/24.  His psychiatric history is significant for major depression, anxiety, alcohol use disorder, and insomnia.  His mental health is currently managed with Buspar  7.5 mg three times daily, Abilify  5 mg daily, and Remeron  7.5 mg daily at bedtime.  He reports he is tolerating current medications without any adverse reactions.  He reports he was unable to take the Remeron  related to it making him feel too tired and sleepy the next day.  He reports BuSpar  seems to be working pretty good and he has noticed improvement in his anxiety but feels he could be a little better.  However he does report he is sleeping a little better.  He reports he is eating without any difficulty.  Reports no new stressors other than having a break-up with his girlfriend related to her still in his wallet ($200) because she is hooked on meth. She hooked on that meth I got enough problems her mom I will need to be borrowing nobody else is. He denies suicidal ideation, self-harm, homicidal ideation, psychosis, paranoia, and abnormal movements. Screenings completed during today's visit C-SSRS, AIMS, Nutrition, and Pain, see scores below.    Recommendations: Discontinue Remeron  7.5 mg daily at bedtime, start by Valerian  root 250 mg to 500 mg daily at bed time as needed.  Continue Abilify  5 mg daily. He was educated on the side effect and efficacy profile of Valerian  rood and educational material was added to AVS. Informed that therapeutic effects may take several weeks to become noticeable.  He voiced understanding and agreement with today's plan and recommendations.  Visit Diagnosis:    ICD-10-CM    1. Severe episode of recurrent major depressive disorder, without psychotic features (HCC)  F33.2 busPIRone  (BUSPAR ) 10 MG tablet    ARIPiprazole  (ABILIFY ) 5 MG tablet    2. GAD (generalized anxiety disorder)  F41.1 busPIRone  (BUSPAR ) 10 MG tablet      Past Psychiatric History:  Diagnosis:  Major depression, anxiety, insomnia Suicide attempt:  Denies Non-suicidal self-injurious behavior:  Denies Psychiatric hospitalization:  Denies Past trauma:  Denies Substance abuse:  Reports a history of alcohol use disorder but has drank any alcohol since 2014 Past psychotropic medication trials:  Prozac ,Gabapentin , Propranolol, Trazodone , Zoloft , Lexapro, Paxil, Cymbalta , Buspar , Seroquel  Past Medical History:  Past Medical History:  Diagnosis Date   Acute pancreatitis    Anxiety    Arthritis    Colonic adenoma    COPD (chronic obstructive pulmonary disease) (HCC)    Depression    Diverticulitis of colon with perforation 08/2010   GERD (gastroesophageal reflux disease)    History of hiatal hernia    Hypertension    Polysubstance abuse (HCC)    Renal insufficiency    Sleep apnea    Intolerant of CPAP   Stroke (HCC) 2021   TIA (transient ischemic attack)     Past Surgical History:  Procedure Laterality Date   APPENDECTOMY     BALLOON DILATION N/A 08/04/2022   Procedure: BALLOON DILATION;  Surgeon: Cindie Carlin POUR, DO;  [facility];  Service: Endoscopy;  Laterality: N/A;   BIOPSY  12/14/2017   Procedure: BIOPSY;  Surgeon:  Shaaron [name], MD;  [facility];  Service: Endoscopy;;  gastric    BIOPSY  10/13/2019   Procedure: BIOPSY;  Surgeon: Shaaron [name], MD;  [facility];  Service: Endoscopy;;  esophagus   BIOPSY  05/20/2021   Procedure: BIOPSY;  Surgeon: Shaaron [name], MD;  [facility];  Service: Endoscopy;;   BIOPSY  12/18/2022   Procedure: BIOPSY;  Surgeon: Shaaron [name], MD;  [facility];  Service: Endoscopy;;    clamp left in colon after abd surgery     COLON SURGERY  05/2011   [facility]. exp laparotomy with colostomy takedown, lysis of adhesions (3hours), loop ileostomy. complicated by abscess formation requiring percutaneous drainage   colonoscopy via stoma with endoscopy of [place]  04/2011   [facility]. two sessile polyps in trv colon. complete resection but only partial retrieval. congested mucosa in area 3cm proximal to stoma, erythematous mucosa in Hartmann pouch. repeat tcs 8/2017path-adenomatous polyp   COLONOSCOPY WITH PROPOFOL  N/A 12/14/2017   Dr. Shaaron: 2 4 to 6 mm polyps in the cecum, tubular adenomas, single rectal polyp tubular adenoma, nonbleeding internal hemorrhoids.  Appears to be a surgical clip at the appendiceal orifice.  Next colonoscopy in 5 years   COLONOSCOPY WITH PROPOFOL  N/A 12/18/2022   Procedure: COLONOSCOPY WITH PROPOFOL ;  Surgeon: Shaaron [name], MD;  [facility];  Service: Endoscopy;  Laterality: N/A;  8:45 am   ESOPHAGEAL DILATION N/A 06/22/2024   Procedure: DILATION, ESOPHAGUS;  Surgeon: Shaaron [name], MD;  [facility];  Service: Endoscopy;  Laterality: N/A;   ESOPHAGEAL MANOMETRY N/A 12/11/2021   Procedure: ESOPHAGEAL MANOMETRY (EM);  Surgeon: Shila Gustav GAILS, MD;  [facility];  Service: Endoscopy;  Laterality: N/A;   ESOPHAGOGASTRODUODENOSCOPY  03/05/2012   erosive reflux esophagitis, bulbar erosions, gastric erosions/petichae, bx showed gastritis, no H.Pylori. Procedure: ESOPHAGOGASTRODUODENOSCOPY (EGD);  Surgeon: [name] Shaaron, MD;  [facility];  Service: Endoscopy;  Laterality: N/A;  11:30   ESOPHAGOGASTRODUODENOSCOPY N/A 06/22/2024   Procedure: EGD (ESOPHAGOGASTRODUODENOSCOPY);  Surgeon: Shaaron [name], MD;  [facility];  Service: Endoscopy;  Laterality: N/A;  11:00am, asa 3   ESOPHAGOGASTRODUODENOSCOPY (EGD) WITH PROPOFOL  N/A 12/14/2017   Dr. Shaaron: Erosive reflux esophagitis with questionable  mild stricturing/non-obstruction at this level status post dilation.  multiple gastric erosions in the stomach biopsy showed reactive gastropathy but no H. pylori.   ESOPHAGOGASTRODUODENOSCOPY (EGD) WITH PROPOFOL  N/A 10/13/2019   negative Barrett's, normal stomach   ESOPHAGOGASTRODUODENOSCOPY (EGD) WITH PROPOFOL  N/A 05/20/2021   inlet patch with superimposed nodule s/p biopsy. Normal esophagus s/p dilation. Normal gastric mucosa. Normal duodenum. Reflux changes on path.   ESOPHAGOGASTRODUODENOSCOPY (EGD) WITH PROPOFOL  N/A 08/04/2022   Procedure: ESOPHAGOGASTRODUODENOSCOPY (EGD) WITH PROPOFOL ;  Surgeon: Cindie Carlin POUR, DO;  [facility];  Service: Endoscopy;  Laterality: N/A;  1:15 pm   ESOPHAGOGASTRODUODENOSCOPY (EGD) WITH PROPOFOL  N/A 12/18/2022   Procedure: ESOPHAGOGASTRODUODENOSCOPY (EGD) WITH PROPOFOL ;  Surgeon: Shaaron [name], MD;  [facility];  Service: Endoscopy;  Laterality: N/A;   FOOT SURGERY Left    strethching of ligaments   HERNIA REPAIR     umbilical   KNEE SURGERY Right 2006   MALONEY DILATION N/A 12/14/2017   Procedure: AGAPITO DILATION;  Surgeon: Shaaron [name], MD;  [facility];  Service: Endoscopy;  Laterality: N/A;   MALONEY DILATION  05/20/2021   Procedure: AGAPITO DILATION;  Surgeon: Shaaron [name], MD;  [facility];  Service: Endoscopy;;   MALONEY DILATION N/A 12/18/2022   Procedure: AGAPITO DILATION;  Surgeon: Shaaron [name], MD;  [facility];  Service: Endoscopy;  Laterality: N/A;   MULTIPLE EXTRACTIONS WITH ALVEOLOPLASTY  02/23/2012   Procedure: MULTIPLE EXTRACION WITH ALVEOLOPLASTY;  Surgeon: Glendia HERO Primrose, DDS;  [facility];  Service: Oral Surgery;  Laterality: Bilateral;  Removal of left mandibular torus, multiple extractions with alveoloplasty   POLYPECTOMY  12/14/2017   Procedure: POLYPECTOMY;  Surgeon: Shaaron [name], MD;  [facility];  Service: Endoscopy;;   POLYPECTOMY   12/18/2022   Procedure: POLYPECTOMY;  Surgeon: Shaaron [name], MD;  [facility];  Service: Endoscopy;;   reversal of colostomy  10/2011   per patient, did not have report   right knee surgery     sigmoid colectomy with end colostomy, Hartmann's pouch  08/2010   [facility]. complicated diverticulitis   UMBILICAL HERNIA REPAIR      Family Psychiatric History: Unaware  Family History:  Family History  Problem Relation Age of Onset   Stomach cancer Father    Lung cancer Father    Throat cancer Father    Cancer Father        lung and throat cancer   Hypertension Father    Liver disease Cousin        cirrhosis, etoh   Throat cancer Paternal Uncle    Lung cancer Paternal Uncle    Colon cancer Cousin 52   Heart disease Mother    Hypertension Mother    Colon cancer Maternal Uncle    Throat cancer Paternal Uncle    Lung cancer Paternal Uncle    Throat cancer Paternal Uncle    Lung cancer Paternal Uncle     Social History:  Social History   Socioeconomic History   Marital status: Single    Spouse name: Not on file   Number of children: 0   Years of education: Not on file   Highest education level: GED or equivalent  Occupational History   Not on file  Tobacco Use   Smoking status: Every Day    Current packs/day: 1.00    Average packs/day: 1 pack/day for 37.0 years (37.0 ttl pk-yrs)    Types: Cigarettes    Passive exposure: Current   Smokeless tobacco: Former   Tobacco comments:    Trying to quit.  Vaping Use   Vaping status: Never Used  Substance and Sexual Activity   Alcohol use: Not Currently   Drug use: Yes    Types: Marijuana    Comment: occasional   Sexual activity: Not on file  Other Topics Concern   Not on file  Social History Narrative   Not on file   Social Drivers of Health   Financial Resource Strain: Medium Risk (06/26/2024)   Overall Financial Resource Strain (CARDIA)    Difficulty of Paying Living Expenses: Somewhat hard  Food  Insecurity: Food Insecurity Present (06/26/2024)   Hunger Vital Sign    Worried About Running Out of Food in the Last Year: Often true    Ran Out of Food in the Last Year: Sometimes true  Transportation Needs: No Transportation Needs (06/26/2024)   PRAPARE - Administrator, Civil Service (Medical): No    Lack of Transportation (Non-Medical): No  Physical Activity: Unknown (06/26/2024)   Exercise Vital Sign    Days of Exercise per Week: Patient declined    Minutes of  Exercise per Session: Not on file  Stress: Stress Concern Present (06/26/2024)   [employer] of Occupational Health - Occupational Stress Questionnaire    Feeling of Stress: Rather much  Social Connections: Socially Isolated (06/26/2024)   Social Connection and Isolation Panel    Frequency of Communication with Friends and Family: Three times a week    Frequency of Social Gatherings with Friends and Family: Never    Attends Religious Services: Patient declined    Active Member of Clubs or Organizations: No    Attends Engineer, Structural: Not on file    Marital Status: Never married    Allergies:  Allergies  Allergen Reactions   Aspirin  Other (See Comments)    Makes me feel bad.   Codeine Itching and Nausea And Vomiting   Latex Other (See Comments)    Turns skin red.    Morphine Hives   Statins Other (See Comments)    Joint pain   Zestril [Lisinopril]     Caused Kidney failure   Zetia  [Ezetimibe ] Other (See Comments)    Joint pain   Adhesive [Tape] Rash    Metabolic Disorder Labs: Lab Results  Component Value Date   HGBA1C 5.5 04/25/2020   MPG 111.15 04/25/2020   No results found for: PROLACTIN Lab Results  Component Value Date   CHOL 220 (H) 03/02/2024   TRIG 139 03/02/2024   HDL 34 (L) 03/02/2024   CHOLHDL 6.5 (H) 03/02/2024   VLDL 31 04/26/2020   LDLCALC 160 (H) 03/02/2024   LDLCALC 113 (H) 07/21/2023   Lab Results  Component Value Date   TSH 3.252 04/25/2020     Current Medications: Current Outpatient Medications  Medication Sig Dispense Refill   albuterol  (VENTOLIN  HFA) 108 (90 Base) MCG/ACT inhaler Inhale 1 puff into the lungs every 4 (four) hours as needed for wheezing or shortness of breath. 18 each 2   Blood Pressure Monitor DEVI Use as directed. 1 each 0   Buprenorphine  HCl-Naloxone  HCl (SUBOXONE ) 8-2 MG FILM PLACE 1 STRIPS UNDER THE TONGUE TWICE DAILY 60 Film 0   cycloSPORINE (RESTASIS) 0.05 % ophthalmic emulsion Place 1 drop into both eyes 2 (two) times daily.     fluticasone  (FLONASE ) 50 MCG/ACT nasal spray SPRAY 2 SPRAYS INTO EACH NOSTRIL EVERY DAY (Patient taking differently: 2 sprays as needed.) 48 mL 1   hydrocortisone  (ANUSOL -HC) 2.5 % rectal cream Place 1 Application rectally 2 (two) times daily as needed for hemorrhoids. 28 g 2   inclisiran (LEQVIO ) 284 MG/1.5ML SOSY injection Inject 284 mg into the skin once.     isosorbide  mononitrate (IMDUR ) 30 MG 24 hr tablet Take 30 mg by mouth 2 (two) times daily.     metoprolol  succinate (TOPROL  XL) 25 MG 24 hr tablet Take 0.5 tablets (12.5 mg total) by mouth daily. 45 tablet 2   pantoprazole  (PROTONIX ) 40 MG tablet TAKE 1 TABLET BY MOUTH TWICE A DAY 180 tablet 2   spironolactone  (ALDACTONE ) 25 MG tablet Take 1 tablet (25 mg total) by mouth daily. 30 tablet 3   Valerian  Root 250 MG CAPS Take 1-2 capsules (250 to 500 mg) daily at bedtime as needed.     ARIPiprazole  (ABILIFY ) 5 MG tablet Take 1 tablet (5 mg total) by mouth daily. 60 tablet 1   busPIRone  (BUSPAR ) 10 MG tablet Take 1 tablet (10 mg total) by mouth 3 (three) times daily. 120 tablet 1   Current Facility-Administered Medications  Medication Dose Route Frequency Provider Last Rate Last  Admin   triamcinolone  acetonide (KENALOG -40) injection 20 mg  20 mg Other Once Janit Thresa HERO, DPM         Musculoskeletal: Strength & Muscle Tone: within normal limits Gait & Station: normal Patient leans: N/A  Psychiatric Specialty  Exam: Review of Systems  Constitutional:        No other complaints voiced at this time  Psychiatric/Behavioral:  Positive for dysphoric mood and sleep disturbance. Negative for hallucinations, self-injury and suicidal ideas. Agitation: Irritability.The patient is nervous/anxious.   All other systems reviewed and are negative.   Blood pressure 118/80, pulse 70, height 5' 7 (1.702 m), weight 214 lb 12.8 oz (97.4 kg), SpO2 92%.Body mass index is 33.64 kg/m.  General Appearance: Casual  Eye Contact:  Good  Speech:  Clear and Coherent and Normal Rate  Volume:  Normal  Mood:  Anxious  Affect:  Appropriate and Congruent  Thought Process:  Coherent, Goal Directed, and Descriptions of Associations: Intact  Orientation:  Full (Time, Place, and Person)  Thought Content: Logical   Suicidal Thoughts:  No  Homicidal Thoughts:  No  Memory:  Immediate;   Good Recent;   Good Remote;   Good  Judgement:  Intact  Insight:  Present  Psychomotor Activity:  Normal  Concentration:  Concentration: Good and Attention Span: Good  Recall:  Good  Fund of Knowledge: Good  Language: Good  Akathisia:  No  Handed:  Right  AIMS (if indicated): done  Assets:  Communication Skills Desire for Improvement Financial Resources/Insurance Housing Leisure Time Physical Health Resilience Social Support Transportation  ADL's:  Intact  Cognition: WNL  Sleep:  Fair   Screenings: Geneticist, Molecular Office Visit from 08/01/2024 in [facility] [place] Office Visit from 06/20/2024 in [facility] [place]  AIMS Total Score 0 0   GAD-7    Flowsheet Row Office Visit from 08/01/2024 in [facility] [place] Office Visit from 06/20/2024 in [facility] [place] Office Visit from 02/07/2021 in [facility] Dept Of [place]  Total GAD-7  Score 14 18 21    PHQ2-9    Flowsheet Row Office Visit from 08/01/2024 in [facility] Visit from 07/25/2024 in [facility] - A Dept Of [place]. [facility] Clinical Support from 07/20/2024 in [facility] Office Visit from 06/20/2024 in [facility] at [place] Office Visit from 05/12/2024 in [facility] - [facility] Dept Of [place]. [facility]  PHQ-2 Total Score 4 2 0 5 2  PHQ-9 Total Score 9 10 -- 20 13   Flowsheet Row Office Visit from 08/01/2024 in [facility] [place] Admission (Discharged) from 06/22/2024 in [facility] Office Visit from 06/20/2024 in [facility] at [place]  C-SSRS RISK CATEGORY No Risk No Risk No Risk     Assessment and Plan: Assessment: Summary of today's assessment: [name] reported current medications are somewhat managing mental health.  BuSpar  is working fine.  Unable to take Remeron  related to adverse reaction (causing fatigue/low energy) the next day.  Reports no other adverse reactions.  Reports eating without difficulty and sleeping better but still can use some improvement.  Medication assessment completed and adjustments made.  He denies suicidal/self-harm/homicidal ideations,  psychosis, paranoia, and abnormal movement. During visit he was dressed appropriate for age and weather.  He was seated comfortably in chair with no noted distress.  He was alert/oriented x 4, calm/cooperative and mood congruent with affect.  He spoke in a clear tone at moderate volume, and normal pace, with good eye contact.  His thought process was coherent, relevant, and there was no indication that he was responding to internal/external stimuli or experiencing delusional thought content.  1. Severe episode of recurrent major depressive disorder, without  psychotic features (HCC) - busPIRone  (BUSPAR ) 10 MG tablet; Take 1 tablet (10 mg total) by mouth 3 (three) times daily.  Dispense: 120 tablet; Refill: 1 - ARIPiprazole  (ABILIFY ) 5 MG tablet; Take 1 tablet (5 mg total) by mouth daily.  Dispense: 60 tablet; Refill: 1  2. GAD (generalized anxiety disorder) - busPIRone  (BUSPAR ) 10 MG tablet; Take 1 tablet (10 mg total) by mouth 3 (three) times daily.  Dispense: 120 tablet; Refill: 1       Plan: Medication management: Meds ordered this encounter  Medications   Valerian  Root 250 MG CAPS    Sig: Take 1-2 capsules (250 to 500 mg) daily at bedtime as needed.    Supervising Provider:   CURRY PATERSON T [[id number]]   busPIRone  (BUSPAR ) 10 MG tablet    Sig: Take 1 tablet (10 mg total) by mouth 3 (three) times daily.    Dispense:  120 tablet    Refill:  1    Supervising Provider:   ARFEEN, SYED T [[id number]]   ARIPiprazole  (ABILIFY ) 5 MG tablet    Sig: Take 1 tablet (5 mg total) by mouth daily.    Dispense:  60 tablet    Refill:  1    Supervising Provider:   ARFEEN, SYED T [[id number]]   Medications Discontinued During This Encounter  Medication Reason   ARIPiprazole  (ABILIFY ) 5 MG tablet Reorder   busPIRone  (BUSPAR ) 7.5 MG tablet     Labs:  Not indicated at this time.      Other:  Counseling/Therapy:  Declined.   BERTHOLD GLACE was instructed to call 911, [phone], mobile crisis, or present to the nearest emergency room should he experiences any suicidal/homicidal ideation, auditory/visual/hallucinations, or detrimental worsening of his mental health condition.   Lamar JULIANNA Griffin participated in the development of this treatment plan and verbalized his understanding/agreement with plan as listed.   Follow Up: Return in 2 months for medication management Call in the interim for any side-effects, decompensation, questions, or problems  Collaboration of Care: Collaboration of Care: Medication Management AEB Medication assessment, adjustment, and  refills  Patient/Guardian was advised Release of Information must be obtained prior to any record release in order to collaborate their care with an outside provider. Patient/Guardian was advised if they have not already done so to contact the registration department to sign all necessary forms in order for us  to release information regarding their care.   Consent: Patient/Guardian gives verbal consent for treatment and assignment of benefits for services provided during this visit. Patient/Guardian expressed understanding and agreed to proceed.    Drew Herman, NP 08/01/2024, 4:25 PM

## 2024-08-12 ENCOUNTER — Ambulatory Visit: Payer: Self-pay

## 2024-08-12 NOTE — Telephone Encounter (Signed)
 FYI Only or Action Required?: FYI only for provider: ED advised.  Patient was last seen in primary care on 07/25/2024 by Rosan Dayton BROCKS, DO.  Called Nurse Triage reporting Headache.  Symptoms began x 2 weeks ago.  Interventions attempted: OTC medications: Tylenol  Ibuprofen.  Symptoms are: gradually worsening.  Triage Disposition: Go to ED Now (or PCP Triage)  Patient/caregiver understands and will follow disposition?: Yes            Copied from CRM #[id number]. Topic: Clinical - Medication Question >> Aug 12, 2024 11:25 AM Debby BROCKS wrote: Reason for CRM: Patient states that he has had a headache for 2 weeks that comes and goes. He would like to be prescribed something so that it can finally go away Reason for Disposition  Patient sounds very sick or weak to the triager  Answer Assessment - Initial Assessment Questions 1. LOCATION: Where does it hurt?      Whole head   2. ONSET: When did the headache start? (e.g., minutes, hours, days)      Ongoing since September of this year, over the past 2 weeks symptoms are worsening   3. PATTERN: Does the pain come and go, or has it been constant since it started?     Constant   5. RECURRENT SYMPTOM: Have you ever had headaches before? If Yes, ask: When was the last time? and What happened that time?      Yes, September 2025  6. CAUSE: What do you think is causing the headache?     Unsure        9. OTHER SYMPTOMS: Do you have any other symptoms? (e.g., fever, stiff neck, eye pain, sore throat, cold symptoms)  Lightheadedness, feelings of fainting yesterday 12/4; not current during triage call.    Patient called in to triage with complaints of persistent headache, This has been ongoing since September of this year, however, in the last 2 weeks, symptoms have worsened.  The patient stated during the call he is experiencing increased confusion x 2 weeks, as well as lightheadedness, and feelings of fainting since  yesterday but has since resolved today. .  For home care, the patient is taking OTC Tylenol  and Ibuprofen, with no relief.  Patient has a neurology appointment scheduled for 11/30/24; but based upon severity of symptoms, the RN referred him to the ED for immediate evaluation based upon increased confusion, and episodes of feeling faint yesterday. There are no available appointments available for an in office visit today, with 12/8 being the soonest available. Pt. agrees with the plan of care, and will have someone take him the ED, and will reach out if symptoms worsen or persist.  Protocols used: [facility]

## 2024-08-15 NOTE — Telephone Encounter (Signed)
Called pt - no answer; mailbox is full, unable to leave a message. 

## 2024-08-18 ENCOUNTER — Encounter: Payer: Self-pay | Admitting: Internal Medicine

## 2024-08-19 ENCOUNTER — Encounter: Payer: Self-pay | Admitting: Internal Medicine

## 2024-08-23 ENCOUNTER — Encounter: Payer: Self-pay | Admitting: Internal Medicine

## 2024-08-23 DIAGNOSIS — I6782 Cerebral ischemia: Secondary | ICD-10-CM | POA: Diagnosis not present

## 2024-08-23 DIAGNOSIS — R059 Cough, unspecified: Secondary | ICD-10-CM | POA: Diagnosis not present

## 2024-09-06 ENCOUNTER — Ambulatory Visit: Admitting: Nurse Practitioner

## 2024-09-06 ENCOUNTER — Encounter: Payer: Self-pay | Admitting: Nurse Practitioner

## 2024-09-06 VITALS — BP 114/72 | HR 76 | Ht 67.0 in | Wt 215.0 lb

## 2024-09-06 DIAGNOSIS — I25119 Atherosclerotic heart disease of native coronary artery with unspecified angina pectoris: Secondary | ICD-10-CM | POA: Insufficient documentation

## 2024-09-06 DIAGNOSIS — Z8673 Personal history of transient ischemic attack (TIA), and cerebral infarction without residual deficits: Secondary | ICD-10-CM | POA: Insufficient documentation

## 2024-09-06 DIAGNOSIS — E785 Hyperlipidemia, unspecified: Secondary | ICD-10-CM

## 2024-09-06 DIAGNOSIS — G510 Bell's palsy: Secondary | ICD-10-CM | POA: Insufficient documentation

## 2024-09-06 DIAGNOSIS — R519 Headache, unspecified: Secondary | ICD-10-CM | POA: Insufficient documentation

## 2024-09-06 DIAGNOSIS — F1910 Other psychoactive substance abuse, uncomplicated: Secondary | ICD-10-CM | POA: Diagnosis present

## 2024-09-06 DIAGNOSIS — I951 Orthostatic hypotension: Secondary | ICD-10-CM | POA: Diagnosis present

## 2024-09-06 DIAGNOSIS — I6529 Occlusion and stenosis of unspecified carotid artery: Secondary | ICD-10-CM | POA: Diagnosis present

## 2024-09-06 DIAGNOSIS — N183 Chronic kidney disease, stage 3 unspecified: Secondary | ICD-10-CM | POA: Diagnosis present

## 2024-09-06 DIAGNOSIS — R079 Chest pain, unspecified: Secondary | ICD-10-CM | POA: Insufficient documentation

## 2024-09-06 DIAGNOSIS — I700 Atherosclerosis of aorta: Secondary | ICD-10-CM | POA: Insufficient documentation

## 2024-09-06 DIAGNOSIS — I10 Essential (primary) hypertension: Secondary | ICD-10-CM | POA: Diagnosis present

## 2024-09-06 DIAGNOSIS — E782 Mixed hyperlipidemia: Secondary | ICD-10-CM | POA: Diagnosis present

## 2024-09-06 MED ORDER — ISOSORBIDE MONONITRATE ER 30 MG PO TB24: 30.0000 mg | ORAL_TABLET | Freq: Two times a day (BID) | ORAL | 5 refills | Status: DC

## 2024-09-06 NOTE — Patient Instructions (Addendum)
 Medication Instructions:  Your physician has recommended you make the following change in your medication:  Stop taking Spironolactone   Increase Imdur  30 mg to twice daily Continue taking all other medications as prescribed  Labwork: Fasting Lipid Panel to be completed at [facility] [place] Fasting  Testing/Procedures: None  Follow-Up: Your physician recommends that you schedule a follow-up appointment in: 4-6 weeks  Any Other Special Instructions Will Be Listed Below (If Applicable). Thank you for choosing [facility]!     If you need a refill on your cardiac medications before your next appointment, please call your pharmacy.

## 2024-09-06 NOTE — Progress Notes (Unsigned)
 " Cardiology Office Note:  .   Date:  09/06/2024 ID:  [name], DOB Mar 06, 1966, MRN [id number] PCP: Celestina Czar, MD  [facility] Providers Cardiologist:  Jayson Sierras, MD {  History of Present Illness: .   [name] is a 58 y.o. male with a PMH of coronary and aortic atherosclerosis, chest pain, mixed HLD, polysubstance abuse, HTN, HLD, COPD, sleep apnea (intolerant to CPAP), hx of CVA in 2022 (self-reported), pulmonary nodules, depression, and self reported history of carotid artery stenosis, who presents today for 6-8 week follow-up.   Last seen by Dr. Sierras on November 21, 2020. Was referred for evaluation of chest discomfort. Lexiscan  Myoview  was obtained and was normal.   ED visit on February 18, 2023 for intermitent epigastric chest discomfort at [facility]. Described more CP at rest, described as burning sensation at center of his chest. CTA of chest revealed no acute PE with evidence of new small solid nodules medially in left lower lobe, hepatic steatosis. CXR showed patchy opacities in LLL. UDS positive for Marijuana and benzos. Trops negative.   Saw patient for follow-up on March 27, 2023.  He noted CP started one month ago. Described it as sharp/indigestion, right-sided, occurred more with exertion, lasted for a few minutes, and sat down and improved, noticed with working outside. Rates 7-8/10 when it occurs. Denied any shortness of breath, palpitations, syncope, presyncope, dizziness, orthopnea, PND, swelling or significant weight changes, acute bleeding, or claudication. Stated he is not on any oxycodone  or cocaine. Echo benign. NST revealed mild decreased activity along inferior wall, findings consistent with small area of mild ischemia.  Study was determined to be low risk.  07/03/2023 - Doing well. Denies any recurrent CP since last office visit. Denies any chest pain, shortness of breath, palpitations, syncope, presyncope, dizziness, orthopnea, PND, swelling or  significant weight changes, acute bleeding, or claudication. Tolerating medications well. Cannot tolerate statins.   01/01/2024 - Presents today for follow-up.  Tells me he experienced left-sided facial droopiness with numbness in March, also could not blink out of his left eye, symptoms lasted for about 2 weeks.  Says he was diagnosed with Bell's palsy. Says symptoms have improved. Denies any recent stroke-like symptoms.  He is scheduled to see Dr. Mona in June. Says when he bends over and stands up, he gets dizzy. Tells me he gets dizzy spells often. Denies any chest pain, shortness of breath, palpitations, syncope, presyncope, orthopnea, PND, swelling or significant weight changes, acute bleeding, or claudication.  04/01/2024  - patient comes in today for 57-month follow-up appointment.  Initial EKG gave preliminary report of acute STEMI with ST segment elevation in leads II, III and aVF.  Repeat EKG showed similar preliminary report with acute STEMI.  Upon entering patient room today, he does not appear well.  He is clammy, clutching his chest and describes chest pain that began at 1030 this morning.  He tells me he has had intermittent sharp chest pain for a while, but says he has never had chest pain this severe before.  He describes it as sharp, at the center of his chest, 10 out of 10 in intensity.  Tells me he feels short of breath with this.  Vital signs are all stable. Inpatient Cardiology was consulted along with DOD, Dr. Wilbert Bihari who is at [facility] today. Dr. Gordy Bergamo looked at EKG images and stated no STEMI, see below.  ED visit 04/01/2024 at [facility] for nonspecific chest pain, workup overall unremarkable. R/O  for ACS, trops negative.   04/21/2024 - Today he presents for ED follow-up. Doing better. Tells me he has had a few episodes of chest pain since I last saw him, but not as severe. Currently receiving Leqvio  infusions. Used to see Dr. Rachele in the past, does not see him anymore. Denies any  recent chest pain, shortness of breath, palpitations, syncope, presyncope, dizziness, orthopnea, PND, swelling or significant weight changes, acute bleeding, or claudication.  06/07/2024 -tells me about a recent ED visit last month for Bell's palsy, he notes left-sided facial droop.  He tells me he has had this before, currently receiving treatment for this. Denies any acute stroke like symptoms.  Tells me he has had a couple chest pain episodes since last office visit, says almost went to the ED for 1 episode but did not. Says episodes are similar to previous office visit on 04/01/2024. Denies any active chest pain today. Denies any shortness of breath, palpitations, syncope, presyncope, dizziness, orthopnea, PND, swelling or significant weight changes, acute bleeding, or claudication.  07/05/2024 - Here for follow-up. Continues to note issues with Bell's palsy, remains on prednisone  and admits to weight gain. Admits to HA associated with this, denies any red flag signs or symptoms. Denies any CP but admits to indigestion issues. Admits to elevated BP readings. Denies any chest pain, shortness of breath, palpitations, syncope, presyncope, dizziness, orthopnea, PND, swelling or significant weight changes, acute bleeding, or claudication.  ED visit since last office visit - pt noted multiple symtpoms. R/o for ACS as he admitted to CP.   09/06/2024 - Still continues to note headache, scheduled to see rheumatology this week. Also tells me he is scheduled to see a Neurologist this year. Does note several episodes of CP since I have last seen him, not bothersome and worrisome to him, random in occurrence, admits any specific exertional symptoms, lasts different lengths. Tells me he is getting himself weaned off Suboxone . He confirms with me he is only taking Imdur  30 mg daily instead of BID. Denies any shortness of breath, palpitations, syncope, presyncope, dizziness, orthopnea, PND, swelling or significant weight  changes, acute bleeding, or claudication.  SH: Occasionally uses marijuana and smokes 1 PPD, trying to quit. Denies any alcohol use.   Studies Reviewed: SABRA    EKG: EKG is not ordered today.       Carotid duplex 05/2023: Summary:  Right Carotid: Velocities in the right ICA are consistent with a 1-39%  stenosis. Non-hemodynamically significant plaque <50% noted in the  CCA. The ECA appears <50% stenosed.   Left Carotid: Velocities in the left ICA are consistent with a 1-39%  stenosis. Non-hemodynamically significant plaque <50% noted in the  CCA. The ECA appears <50% stenosed.   Vertebrals:  Bilateral vertebral arteries demonstrate antegrade flow.  Subclavians: Normal flow hemodynamics were seen in bilateral subclavian arteries.  Lexiscan  04/2023:   Lexiscan  stress without EKG changes   Myoview  scan stress images show mild decreased activity in the inferior wall (mid/distal). In the recovery images there was improvement in this region consistent with a small area of mild ischemia. When compared to scan from 2019, subtle thinning present in previous study  but in both the  rest and the stress images.   Note, motion during study may interfere with accuracy.   LVEF calculated at 70% with normal wall motion   Overall low risk study  Echo 03/2023: 1. Left ventricular ejection fraction, by estimation, is 60 to 65%. The  left ventricle has  normal function. The left ventricle has no regional  wall motion abnormalities. There is mild left ventricular hypertrophy.  Left ventricular diastolic parameters  are consistent with Grade I diastolic dysfunction (impaired relaxation).   2. Right ventricular systolic function is normal. The right ventricular  size is normal. Tricuspid regurgitation signal is inadequate for assessing  PA pressure.   3. The mitral valve is normal in structure. No evidence of mitral valve  regurgitation. No evidence of mitral stenosis.   4. The aortic valve is tricuspid.  Aortic valve regurgitation is not  visualized. No aortic stenosis is present.   5. The inferior vena cava is normal in size with greater than 50%  respiratory variability, suggesting right atrial pressure of 3 mmHg.   Comparison(s): No significant change from prior study.  Lexiscan  11/2020:  No diagnostic ST segment changes to indicate ischemia. No significant myocardial perfusion defects on stress imaging to indicate scar or ischemia. There is diaphragmatic attenuation noted, mainly on the rest images. This is a low risk study. Nuclear stress EF: 63%.   Physical Exam:   VS:  BP 114/72 (BP Location: Left Arm, Cuff Size: Normal)   Pulse 76   Ht 5' 7 (1.702 m)   Wt 215 lb (97.5 kg)   SpO2 95%   BMI 33.67 kg/m    Wt Readings from Last 3 Encounters:  09/09/24 218 lb (98.9 kg)  09/06/24 215 lb (97.5 kg)  07/25/24 211 lb (95.7 kg)   GEN: Obese, 58 y.o. male in no acute distress NECK: No JVD; No carotid bruits CARDIAC: S1/S2, RRR, no murmurs, rubs, gallops RESPIRATORY:  Clear to auscultation without rales, wheezing or rhonchi  ABDOMEN: Soft, non-tender, non-distended EXTREMITIES:  No edema; No deformity    ASSESSMENT AND PLAN: .    Coronary atherosclerosis and aortic atherosclerosis, chest pain of uncertain etiology Does admit to some CP episodes - see HPI. Cannot tolerate Aspirin . Cannot tolerate statins or Zetia  - have added to his allergy list.  Currently receiving Leqvio . Will incresae Imdur  to 30 mg BID and will stop Aldactone  - see below. No other medication changes at this time. Heart healthy diet and regular cardiovascular exercise encouraged. Care and ED precautions discussed.     2. HTN, orthostatic hypotension BP normal today. SBP goal < 130.  Denies any recent orthostatic symptoms recently. Cannot start amlodipine  as this caused lower extremity swelling and lisinopril caused AKI.  Will stop Aldactone  and increase Imdur  to 30 mg BID. Discussed conservative measures to  help improve his symptoms. Discussed to monitor BP at home at least 2 hours after medications and sitting for 5-10 minutes.    3. Mixed HLD Hx of multiple statin intolerances and could not tolerate Zetia .  He is receiving Leqvio  infusions. Continue to follow-up with Dr. Mona. Will obtain FLP.    4. Hx of CVA, carotid artery stenosis Denies any symptoms. Continue to follow with PCP.  Cannot tolerate low dose Aspirin , unable to tolerate statins. Currently receiving Leqvio  infusions. Heart healthy diet and regular cardiovascular exercise encouraged. Checking FLP as noted above.    5. Polysubstance abuse Substance abuse cessation encouraged and discussed. He verbalized understanding.   6. CKD stage 3 Most recent labs show sCr at 1.42 with eGFR 57. No med changes besides what is noted above. Avoid nephrotoxic agents.  Continue follow-up with PCP.  7. Bell's palsy, headache Denies any progressive or worsening symptoms.  Symptoms are stable since last office visit.  Follow-up with Rheumatology and Neuro as  scheduled. Care and ED precautions discussed.    Dispo: Care and ED precautions discussed. Follow-up with Dr. Debera or APP in 4-6 weeks or sooner if anything changes.   Signed, Almarie Crate, NP   "

## 2024-09-09 ENCOUNTER — Ambulatory Visit

## 2024-09-09 VITALS — BP 165/101 | HR 69 | Temp 97.7°F | Resp 12 | Ht 68.5 in | Wt 218.0 lb

## 2024-09-09 DIAGNOSIS — R519 Headache, unspecified: Secondary | ICD-10-CM | POA: Insufficient documentation

## 2024-09-09 DIAGNOSIS — M2550 Pain in unspecified joint: Secondary | ICD-10-CM | POA: Diagnosis present

## 2024-09-09 DIAGNOSIS — R030 Elevated blood-pressure reading, without diagnosis of hypertension: Secondary | ICD-10-CM | POA: Diagnosis not present

## 2024-09-09 MED ORDER — PREDNISONE 20 MG PO TABS: ORAL_TABLET | ORAL | 0 refills | Status: AC

## 2024-09-09 NOTE — Progress Notes (Signed)
 "  Office Visit Note  Patient: [name]             Date of Birth: Oct 23, 1965           MRN: [id number]             PCP: Celestina Czar, MD Referring: Shawn Sick, MD Visit Date: 09/09/2024 Occupation: Data Unavailable  Subjective:  New Patient (Initial Visit) (Rule out GCA, no grip strength/)  Discussed the use of AI scribe software for clinical note transcription with the patient, who gave verbal consent to proceed.  History of Present Illness [name] is a 59 year old male with Bell's palsy and a history of stroke who presents with worsening headaches and joint pain.  He has been experiencing headaches since February of the previous year, coinciding with the onset of Bell's palsy. The headaches have progressively worsened since June, becoming more frequent and severe, and are localized to the side of the head affected by Bell's palsy. No vision changes are noted, but chewing can sometimes cause muscle fatigue. A recent CT scan of the head was performed two weeks ago due to severe headaches, leading to an emergency room visit where he was treated for dehydration.  He was previously on prednisone  for two to three months at a dose of 30 mg, which did not alleviate his headaches but caused weight gain. He has a history of stroke approximately five years ago and has not been seeing a neurologist regularly since then. Additionally, he has sleep apnea but is unable to use his CPAP machine due to discomfort and headaches.  He experiences joint pain primarily in his hands, which has been persistent for a long time. 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 He reports losing grip and dropping things frequently. He also mentions knee pain due to a torn ACL and numbness in his hands and arms since 2017, which he attributes to disc disease.  He has a significant surgical history, including multiple stomach surgeries following complications from a colonoscopy  that led to diverticulitis and subsequent emergency surgery. He reports that taking medications like Tylenol  and aspirin  causes stomach discomfort due to his surgical history.    Activities of Daily Living:  Patient reports morning stiffness for 15-20 minutes.   Patient Reports nocturnal pain.  Difficulty dressing/grooming: Denies Difficulty climbing stairs: Reports Difficulty getting out of chair: Reports   Review of Systems  Constitutional:  Positive for fatigue.  HENT:  Positive for mouth sores and mouth dryness.   Eyes:  Negative for dryness.  Respiratory:  Positive for shortness of breath.   Cardiovascular:  Positive for chest pain. Negative for palpitations.  Gastrointestinal:  Negative for blood in stool, constipation and diarrhea.  Endocrine: Positive for increased urination.  Genitourinary:  Negative for involuntary urination.  Musculoskeletal:  Positive for joint pain, gait problem, joint pain, joint swelling, myalgias, muscle weakness, morning stiffness and myalgias. Negative for muscle tenderness.  Skin:  Negative for color change, rash, hair loss and sensitivity to sunlight.  Allergic/Immunologic: Negative for susceptible to infections.  Neurological:  Positive for dizziness and headaches.  Hematological:  Negative for swollen glands.  Psychiatric/Behavioral:  Positive for depressed mood and sleep disturbance. The patient is nervous/anxious.     PMFS History:  Patient Active Problem List   Diagnosis Date Noted   Bell's palsy 11/11/2023   Acute cough 10/14/2023   Benzodiazepine withdrawal (HCC) 10/14/2023   Anxiety 10/14/2023   Nocturnal  hypoxia 07/13/2023   Bilateral hand pain 06/09/2023   Excessive daytime sleepiness 05/15/2023   Loud snoring 05/15/2023   Obesity (BMI 30.0-34.9) 05/15/2023   Moderate obstructive sleep apnea 02/23/2023   Lung nodules 02/23/2023   Constipation 11/28/2022   Carpal tunnel syndrome on both sides 11/24/2022   Heel pain, bilateral  07/29/2021   Healthcare maintenance 07/29/2021   Gout 05/24/2021   Epidermoid cyst of skin 02/18/2021   Erectile disorder due to medical condition in male 01/10/2021   Left knee pain 12/11/2020   Vitamin D deficiency 10/12/2020   Opioid use disorder, mild, in sustained remission, on maintenance therapy, abuse (HCC) 08/16/2020   Dizziness 05/23/2020   Cervical radiculopathy    Tobacco abuse    Hyperlipidemia    Chronic obstructive pulmonary disease (HCC) 12/14/2017   Dysphagia 09/24/2017   History of colonic polyps 09/24/2017   Intractable headache 05/06/2012   GERD (gastroesophageal reflux disease) 03/15/2012   Depression 10/05/2009   HTN (hypertension) 10/05/2009    Past Medical History:  Diagnosis Date   Acute pancreatitis    Anxiety    Arthritis    Bell's palsy    Colonic adenoma    COPD (chronic obstructive pulmonary disease) (HCC)    Depression    Diverticulitis of colon with perforation 08/2010   GERD (gastroesophageal reflux disease)    History of hiatal hernia    Hypertension    Polysubstance abuse (HCC)    Renal insufficiency    Sleep apnea    Intolerant of CPAP   Stroke (HCC) 2021   TIA (transient ischemic attack)     Family History  Problem Relation Age of Onset   Heart disease Mother    Hypertension Mother    Heart disease Father    Stomach cancer Father    Lung cancer Father    Throat cancer Father    Cancer Father        lung and throat cancer   Hypertension Father    Cancer Brother    Colon cancer Maternal Uncle    Throat cancer Paternal Uncle    Lung cancer Paternal Uncle    Throat cancer Paternal Uncle    Lung cancer Paternal Uncle    Throat cancer Paternal Uncle    Lung cancer Paternal Uncle    Liver disease Cousin        cirrhosis, etoh   Colon cancer Cousin 14   Past Surgical History:  Procedure Laterality Date   APPENDECTOMY     BALLOON DILATION N/A 08/04/2022   Procedure: BALLOON DILATION;  Surgeon: Cindie Carlin POUR, DO;   [facility]: [facility];  Service: Endoscopy;  Laterality: N/A;   BIOPSY  12/14/2017   Procedure: BIOPSY;  Surgeon: Shaaron [name], MD;  [facility]: [facility];  Service: Endoscopy;;  gastric    BIOPSY  10/13/2019   Procedure: BIOPSY;  Surgeon: Shaaron [name], MD;  [facility];  Service: Endoscopy;;  esophagus   BIOPSY  05/20/2021   Procedure: BIOPSY;  Surgeon: Shaaron [name], MD;  [facility];  Service: Endoscopy;;   BIOPSY  12/18/2022   Procedure: BIOPSY;  Surgeon: Shaaron [name], MD;  [facility];  Service: Endoscopy;;   clamp left in colon after abd surgery     COLON SURGERY  05/2011   [facility]. exp laparotomy with colostomy takedown, lysis of adhesions (3hours), loop ileostomy. complicated by abscess formation requiring percutaneous drainage   colonoscopy via stoma with endoscopy of [facility]  Pouch  04/2011   [facility]. two sessile polyps in trv colon. complete resection but only partial retrieval. congested mucosa in area 3cm proximal to stoma, erythematous mucosa in Hartmann pouch. repeat tcs 8/2017path-adenomatous polyp   COLONOSCOPY WITH PROPOFOL  N/A 12/14/2017   Dr. Shaaron: 2 4 to 6 mm polyps in the cecum, tubular adenomas, single rectal polyp tubular adenoma, nonbleeding internal hemorrhoids.  Appears to be a surgical clip at the appendiceal orifice.  Next colonoscopy in 5 years   COLONOSCOPY WITH PROPOFOL  N/A 12/18/2022   Procedure: COLONOSCOPY WITH PROPOFOL ;  Surgeon: Shaaron [name], MD;  [facility];  Service: Endoscopy;  Laterality: N/A;  8:45 am   ESOPHAGEAL DILATION N/A 06/22/2024   Procedure: DILATION, ESOPHAGUS;  Surgeon: Shaaron [name], MD;  [facility];  Service: Endoscopy;  Laterality: N/A;   ESOPHAGEAL MANOMETRY N/A 12/11/2021   Procedure: ESOPHAGEAL MANOMETRY (EM);  Surgeon: Shila Gustav GAILS, MD;  Location: [facility];  Service: Endoscopy;  Laterality: N/A;   ESOPHAGOGASTRODUODENOSCOPY  03/05/2012   erosive  reflux esophagitis, bulbar erosions, gastric erosions/petichae, bx showed gastritis, no H.Pylori. Procedure: ESOPHAGOGASTRODUODENOSCOPY (EGD);  Surgeon: [name] Shaaron, MD;  [facility];  Service: Endoscopy;  Laterality: N/A;  11:30   ESOPHAGOGASTRODUODENOSCOPY N/A 06/22/2024   Procedure: EGD (ESOPHAGOGASTRODUODENOSCOPY);  Surgeon: Shaaron [name], MD;  [facility];  Service: Endoscopy;  Laterality: N/A;  11:00am, asa 3   ESOPHAGOGASTRODUODENOSCOPY (EGD) WITH PROPOFOL  N/A 12/14/2017   Dr. Shaaron: Erosive reflux esophagitis with questionable mild stricturing/non-obstruction at this level status post dilation.  multiple gastric erosions in the stomach biopsy showed reactive gastropathy but no H. pylori.   ESOPHAGOGASTRODUODENOSCOPY (EGD) WITH PROPOFOL  N/A 10/13/2019   negative Barrett's, normal stomach   ESOPHAGOGASTRODUODENOSCOPY (EGD) WITH PROPOFOL  N/A 05/20/2021   inlet patch with superimposed nodule s/p biopsy. Normal esophagus s/p dilation. Normal gastric mucosa. Normal duodenum. Reflux changes on path.   ESOPHAGOGASTRODUODENOSCOPY (EGD) WITH PROPOFOL  N/A 08/04/2022   Procedure: ESOPHAGOGASTRODUODENOSCOPY (EGD) WITH PROPOFOL ;  Surgeon: Cindie Carlin POUR, DO;  [facility];  Service: Endoscopy;  Laterality: N/A;  1:15 pm   ESOPHAGOGASTRODUODENOSCOPY (EGD) WITH PROPOFOL  N/A 12/18/2022   Procedure: ESOPHAGOGASTRODUODENOSCOPY (EGD) WITH PROPOFOL ;  Surgeon: Shaaron [name], MD;  [facility];  Service: Endoscopy;  Laterality: N/A;   FOOT SURGERY Left    strethching of ligaments   HERNIA REPAIR     umbilical   KNEE SURGERY Right 2006   MALONEY DILATION N/A 12/14/2017   Procedure: AGAPITO DILATION;  Surgeon: Shaaron [name], MD;  [facility];  Service: Endoscopy;  Laterality: N/A;   MALONEY DILATION  05/20/2021   Procedure: AGAPITO DILATION;  Surgeon: Shaaron [name], MD;  [facility];  Service: Endoscopy;;   MALONEY DILATION N/A  12/18/2022   Procedure: AGAPITO HODGKIN;  Surgeon: Shaaron [name], MD;  [facility];  Service: Endoscopy;  Laterality: N/A;   MULTIPLE EXTRACTIONS WITH ALVEOLOPLASTY  02/23/2012   Procedure: MULTIPLE EXTRACION WITH ALVEOLOPLASTY;  Surgeon: Glendia HERO Primrose, DDS;  Location: [facility];  Service: Oral Surgery;  Laterality: Bilateral;  Removal of left mandibular torus, multiple extractions with alveoloplasty   POLYPECTOMY  12/14/2017   Procedure: POLYPECTOMY;  Surgeon: Shaaron [name], MD;  [facility];  Service: Endoscopy;;   POLYPECTOMY  12/18/2022   Procedure: POLYPECTOMY;  Surgeon: Shaaron [name], MD;  [facility];  Service: Endoscopy;;   reversal of colostomy  10/2011  per patient, did not have report   right knee surgery     sigmoid colectomy with end colostomy, Hartmann's pouch  08/2010   [facility]. complicated diverticulitis   UMBILICAL HERNIA REPAIR     Social History[1] Social History   Social History Narrative   Not on file     Immunization History  Administered Date(s) Administered   Influenza, Seasonal, Injecte, Preservative Fre 06/26/2011, 05/12/2024   Influenza,inj,Quad PF,6+ Mos 05/23/2020, 07/29/2021   Moderna Sars-Covid-2 Vaccination 12/22/2019, 01/18/2020   Pneumococcal Polysaccharide-23 09/03/2011   Tdap 05/07/1997, 11/07/2004, 05/27/2005, 09/13/2020     Objective: Vital Signs: BP (!) 165/101 (BP Location: Right Arm, Patient Position: Sitting, Cuff Size: Normal)   Pulse 69   Temp 97.7 F (36.5 C)   Resp 12   Ht 5' 8.5 (1.74 m)   Wt 218 lb (98.9 kg)   BMI 32.66 kg/m    Physical Exam Vitals and nursing note reviewed.  HENT:     Head: Normocephalic and atraumatic.     Comments: Tenderness to palpation of left temporal scalp and lateral left eye.     Nose: Nose normal.  Eyes:     Conjunctiva/sclera: Conjunctivae normal.     Pupils: Pupils are equal, round, and reactive to light.  Pulmonary:     Effort: Pulmonary effort is  normal. No respiratory distress.  Musculoskeletal:     Comments: Tenderness to palpation of all hand joints w/o noted synovitis. Synovial thickening noted. Hands discolored from black dirt.   Skin:    General: Skin is warm and dry.  Neurological:     Mental Status: He is alert. Mental status is at baseline.  Psychiatric:        Mood and Affect: Mood normal.        Behavior: Behavior normal.      Musculoskeletal Exam:    Investigation: No additional findings.  Imaging: No results found.  Recent Labs: Lab Results  Component Value Date   WBC 12.3 (H) 06/15/2024   HGB 15.1 06/15/2024   PLT 224 06/15/2024   NA 140 06/15/2024   K 4.3 06/15/2024   CL 104 06/15/2024   CO2 24 06/15/2024   GLUCOSE 149 (H) 06/15/2024   BUN 25 (H) 06/15/2024   CREATININE 1.31 (H) 06/15/2024   BILITOT 0.2 06/15/2024   ALKPHOS 77 06/15/2024   AST 18 06/15/2024   ALT 19 06/15/2024   PROT 7.6 06/15/2024   ALBUMIN 4.2 06/15/2024   CALCIUM  9.4 06/15/2024   GFRAA 89 05/23/2020    Speciality Comments: No specialty comments available.  Procedures:  No procedures performed Allergies: Aspirin , Codeine, Latex, Morphine, Statins, Zestril [lisinopril], Zetia  [ezetimibe ], and Adhesive [tape]   Assessment / Plan:     Visit Diagnoses:  Nonintractable headache, unspecified chronicity pattern, unspecified headache type Patient with headache of left temporal region for ~11 months of unclear etiology. When he finally underwent evaluation in October of last year, he was also found to have an elevated ESR at 41. His presentation is extremely complicated given that his inflammatory markers improved on steroids, with actual worsening of his initial symptoms with prednisone  (which is highly unusual). He states that his headaches have only progressed over this time period, which does not correlate with the typical response to prednisone  or the normalization of his inflammatory markers. Will repeat Sedimentation rate  and C-reactive protein today.  As discussed with patient, an erythrocyte sedimentation rate (ESR) and c-reactive protein (CRP) are nonspecific markers of inflammation. They can be elevated in  a variety of conditions, including infections, inflammatory diseases such as rheumatoid arthritis and vasculitis, and malignancies. There are multiple non-inflammatory causes including, but not limited to, metabolic syndrome, obesity, smoking, anemia, and kidney disease.   Even though he has been on prednisone , I do think a TAB would be extremely helpful, especially since it can often times show areas of treated vasculitis which would help further suggest GCA as the culprit. Given he has only had small amounts of prednisone  at infrequent intervals, patient provided with appropriate prednisone  taper for GCA to see if this improves his symptoms.   Polyarthralgia Patient with significant tenderness to palpation of b/l hands. He has significant synovial thickening making palpation difficult, however no clear synovitis. Will check Rheumatoid Factor, Cyclic citrul peptide antibody, IgG today.  Elevated blood pressure reading Patient with elevated BP reading with known hx of hypertension. He reports not taking his AM BP medications. He denies any new symptoms such as new/worsening headache or dizziness. Advised to keep an eye on his BP and reach out to PCP if it does not improve. Also advised to seek immediate care if he develops any of the aforementioned symptoms.  Orders: Orders Placed This Encounter  Procedures   Sedimentation rate   C-reactive protein   Rheumatoid Factor   Cyclic citrul peptide antibody, IgG   Meds ordered this encounter  Medications   predniSONE  (DELTASONE ) 20 MG tablet    Sig: Take 3 tablets (60 mg total) by mouth daily with breakfast for 14 days, THEN 2.5 tablets (50 mg total) daily with breakfast for 7 days, THEN 2 tablets (40 mg total) daily with breakfast for 10 days.    Dispense:  80  tablet    Refill:  0    I personally spent a total of 60 minutes in the care of the patient today including preparing to see the patient, getting/reviewing separately obtained history, performing a medically appropriate exam/evaluation, counseling and educating, placing orders, documenting clinical information in the EHR, independently interpreting results, and communicating results.   Follow-Up Instructions: No follow-ups on file.   Asberry Claw, DO  Note - This record has been created using Animal nutritionist.  Chart creation errors have been sought, but may not always  have been located. Such creation errors do not reflect on  the standard of medical care.     [1]  Social History Tobacco Use   Smoking status: Every Day    Current packs/day: 1.00    Average packs/day: 1 pack/day for 37.0 years (37.0 ttl pk-yrs)    Types: Cigarettes    Passive exposure: Current   Smokeless tobacco: Former    Types: Chew   Tobacco comments:    Trying to quit.  Vaping Use   Vaping status: Former  Substance Use Topics   Alcohol use: Not Currently   Drug use: Yes    Types: Marijuana    Comment: occasional   "

## 2024-09-12 ENCOUNTER — Institutional Professional Consult (permissible substitution) (INDEPENDENT_AMBULATORY_CARE_PROVIDER_SITE_OTHER): Admitting: Otolaryngology

## 2024-09-12 LAB — RHEUMATOID FACTOR: Rheumatoid fact SerPl-aCnc: <10 [IU]/mL

## 2024-09-12 LAB — C-REACTIVE PROTEIN: CRP: 16.9 mg/L — ABNORMAL HIGH

## 2024-09-12 LAB — SEDIMENTATION RATE: Sed Rate: 11 mm/h (ref 0–20)

## 2024-09-12 LAB — CYCLIC CITRUL PEPTIDE ANTIBODY, IGG: Cyclic Citrullin Peptide Ab: <16 U

## 2024-09-13 ENCOUNTER — Encounter: Payer: Self-pay | Admitting: Internal Medicine

## 2024-09-14 ENCOUNTER — Encounter: Payer: Self-pay | Admitting: Internal Medicine

## 2024-09-15 ENCOUNTER — Encounter: Payer: Self-pay | Admitting: Internal Medicine

## 2024-09-15 ENCOUNTER — Institutional Professional Consult (permissible substitution) (INDEPENDENT_AMBULATORY_CARE_PROVIDER_SITE_OTHER): Admitting: Otolaryngology

## 2024-09-26 ENCOUNTER — Ambulatory Visit (HOSPITAL_COMMUNITY): Admitting: Registered Nurse

## 2024-09-26 ENCOUNTER — Encounter (HOSPITAL_COMMUNITY): Payer: Self-pay | Admitting: Registered Nurse

## 2024-09-26 DIAGNOSIS — F411 Generalized anxiety disorder: Secondary | ICD-10-CM | POA: Diagnosis not present

## 2024-09-26 DIAGNOSIS — F332 Major depressive disorder, recurrent severe without psychotic features: Secondary | ICD-10-CM | POA: Diagnosis not present

## 2024-09-26 MED ORDER — QUETIAPINE FUMARATE 25 MG PO TABS: 25.0000 mg | ORAL_TABLET | Freq: Two times a day (BID) | ORAL | 1 refills | Status: AC | PRN

## 2024-09-26 MED ORDER — BUSPIRONE HCL 10 MG PO TABS: 10.0000 mg | ORAL_TABLET | Freq: Three times a day (TID) | ORAL | 1 refills | Status: AC

## 2024-09-26 MED ORDER — QUETIAPINE FUMARATE 50 MG PO TABS: 50.0000 mg | ORAL_TABLET | Freq: Every day | ORAL | 1 refills | Status: AC

## 2024-09-26 MED ORDER — BUSPIRONE HCL 5 MG PO TABS: 5.0000 mg | ORAL_TABLET | Freq: Three times a day (TID) | ORAL | 1 refills | Status: AC

## 2024-09-26 NOTE — Patient Instructions (Signed)

## 2024-09-26 NOTE — Progress Notes (Signed)
 [facility] MD/PA/NP OP Progress Note  09/26/2024 7:47 PM [name]  MRN:  [id number]  I personally spent a total of 30 minutes in the care of the patient today including preparing to see the patient, getting/reviewing separately obtained history, performing a medically appropriate exam/evaluation, counseling and educating, placing orders, and documenting clinical information in the EHR in addiction to conducting screenings PHQ-9, C-SSRS, GAD-7, AIMS, Nutrition, and Pain, discussing medication options, medication education, and discussing safety.   Chief Complaint:  No chief complaint on file.  HPI: [name] 59 y.o. male presents today for medication management follow up.  He was seen face-to-face by this provider and chart reviewed on 09/26/24.  His psychiatric history is significant for major depression, anxiety, alcohol use disorder, and insomnia.  His mental health is currently managed with Buspar  10 mg three times daily, Abilify  5 mg daily.  He reported current medications are not effectively managing his mental health and has stopped both.  He reports Abilify  caused him to feel paranoid.   He reports he has been compliant with medications.  He reported a recent fall also a 8 foot ladder receiving a head injury.  Repots eating and sleeping without difficulty.  He denies suicidal/self-harm/homicidal ideation, psychosis, paranoia, and abnormal movement.  Screenings completed during today's visit C-SSRS, AIMS, Nutrition, and Pain, see scores below.   Medication options discussed and agreed to a trial of Seroquel  and adjustment to BuSpar .  He was educated on the side effect and efficacy profile of BuSpar , Seroquel  and educational material was added to AVS to see if it  Recommendations: Start Seroquel  50 mg daily at bedtime, 25 mg twice daily as needed, increase BuSpar  15 mg 3 times daily as needed.   He voiced understanding and agreement with today's plan and recommendations.  Visit Diagnosis:     ICD-10-CM   1. Severe episode of recurrent major depressive disorder, without psychotic features (HCC)  F33.2 busPIRone  (BUSPAR ) 10 MG tablet    2. GAD (generalized anxiety disorder)  F41.1 busPIRone  (BUSPAR ) 10 MG tablet     Past Psychiatric History:  Diagnosis:  Major depression, anxiety, insomnia Suicide attempt:  Denies Non-suicidal self-injurious behavior:  Denies Psychiatric hospitalization:  Denies Past trauma:  Denies Substance abuse:  Reports a history of alcohol use disorder but has drank any alcohol since 2014 Past psychotropic medication trials:  Prozac ,Gabapentin , Propranolol, Trazodone , Zoloft , Lexapro, Paxil, Cymbalta , Buspar , Seroquel   Past Medical History:  Past Medical History:  Diagnosis Date   Acute pancreatitis    Anxiety    Arthritis    Bell's palsy    Colonic adenoma    COPD (chronic obstructive pulmonary disease) (HCC)    Depression    Diverticulitis of colon with perforation 08/2010   GERD (gastroesophageal reflux disease)    History of hiatal hernia    Hypertension    Polysubstance abuse (HCC)    Renal insufficiency    Sleep apnea    Intolerant of CPAP   Stroke (HCC) 2021   TIA (transient ischemic attack)     Past Surgical History:  Procedure Laterality Date   APPENDECTOMY     BALLOON DILATION N/A 08/04/2022   Procedure: BALLOON DILATION;  Surgeon: Cindie Carlin POUR, DO;  [facility];  Service: Endoscopy;  Laterality: N/A;   BIOPSY  12/14/2017   Procedure: BIOPSY;  Surgeon: Shaaron [name], MD;  [facility];  Service: Endoscopy;;  gastric    BIOPSY  10/13/2019   Procedure: BIOPSY;  Surgeon:  Shaaron [name], MD;  [facility];  Service: Endoscopy;;  esophagus   BIOPSY  05/20/2021   Procedure: BIOPSY;  Surgeon: Shaaron [name], MD;  [facility];  Service: Endoscopy;;   BIOPSY  12/18/2022   Procedure: BIOPSY;  Surgeon: Shaaron [name], MD;  [facility];  Service: Endoscopy;;   clamp  left in colon after abd surgery     COLON SURGERY  05/2011   [facility]. exp laparotomy with colostomy takedown, lysis of adhesions (3hours), loop ileostomy. complicated by abscess formation requiring percutaneous drainage   colonoscopy via stoma with endoscopy of [place]  04/2011   [facility]. two sessile polyps in trv colon. complete resection but only partial retrieval. congested mucosa in area 3cm proximal to stoma, erythematous mucosa in Hartmann pouch. repeat tcs 8/2017path-adenomatous polyp   COLONOSCOPY WITH PROPOFOL  N/A 12/14/2017   Dr. Shaaron: 2 4 to 6 mm polyps in the cecum, tubular adenomas, single rectal polyp tubular adenoma, nonbleeding internal hemorrhoids.  Appears to be a surgical clip at the appendiceal orifice.  Next colonoscopy in 5 years   COLONOSCOPY WITH PROPOFOL  N/A 12/18/2022   Procedure: COLONOSCOPY WITH PROPOFOL ;  Surgeon: Shaaron [name], MD;  [facility];  Service: Endoscopy;  Laterality: N/A;  8:45 am   ESOPHAGEAL DILATION N/A 06/22/2024   Procedure: DILATION, ESOPHAGUS;  Surgeon: Shaaron [name], MD;  [facility];  Service: Endoscopy;  Laterality: N/A;   ESOPHAGEAL MANOMETRY N/A 12/11/2021   Procedure: ESOPHAGEAL MANOMETRY (EM);  Surgeon: Shila Gustav GAILS, MD;  [facility];  Service: Endoscopy;  Laterality: N/A;   ESOPHAGOGASTRODUODENOSCOPY  03/05/2012   erosive reflux esophagitis, bulbar erosions, gastric erosions/petichae, bx showed gastritis, no H.Pylori. Procedure: ESOPHAGOGASTRODUODENOSCOPY (EGD);  Surgeon: [name] Shaaron, MD;  [facility];  Service: Endoscopy;  Laterality: N/A;  11:30   ESOPHAGOGASTRODUODENOSCOPY N/A 06/22/2024   Procedure: EGD (ESOPHAGOGASTRODUODENOSCOPY);  Surgeon: Shaaron [name], MD;  [facility];  Service: Endoscopy;  Laterality: N/A;  11:00am, asa 3   ESOPHAGOGASTRODUODENOSCOPY (EGD) WITH PROPOFOL  N/A 12/14/2017   Dr. Shaaron: Erosive reflux esophagitis with questionable mild  stricturing/non-obstruction at this level status post dilation.  multiple gastric erosions in the stomach biopsy showed reactive gastropathy but no H. pylori.   ESOPHAGOGASTRODUODENOSCOPY (EGD) WITH PROPOFOL  N/A 10/13/2019   negative Barrett's, normal stomach   ESOPHAGOGASTRODUODENOSCOPY (EGD) WITH PROPOFOL  N/A 05/20/2021   inlet patch with superimposed nodule s/p biopsy. Normal esophagus s/p dilation. Normal gastric mucosa. Normal duodenum. Reflux changes on path.   ESOPHAGOGASTRODUODENOSCOPY (EGD) WITH PROPOFOL  N/A 08/04/2022   Procedure: ESOPHAGOGASTRODUODENOSCOPY (EGD) WITH PROPOFOL ;  Surgeon: Cindie Carlin POUR, DO;  [facility];  Service: Endoscopy;  Laterality: N/A;  1:15 pm   ESOPHAGOGASTRODUODENOSCOPY (EGD) WITH PROPOFOL  N/A 12/18/2022   Procedure: ESOPHAGOGASTRODUODENOSCOPY (EGD) WITH PROPOFOL ;  Surgeon: Shaaron [name], MD;  [facility];  Service: Endoscopy;  Laterality: N/A;   FOOT SURGERY Left    strethching of ligaments   HERNIA REPAIR     umbilical   KNEE SURGERY Right 2006   MALONEY DILATION N/A 12/14/2017   Procedure: AGAPITO DILATION;  Surgeon: Shaaron [name], MD;  [facility];  Service: Endoscopy;  Laterality: N/A;   MALONEY DILATION  05/20/2021   Procedure: AGAPITO DILATION;  Surgeon: Shaaron [name], MD;  [facility];  Service: Endoscopy;;   MALONEY DILATION N/A 12/18/2022   Procedure: AGAPITO HODGKIN;  Surgeon: Shaaron [name], MD;  [facility];  Service: Endoscopy;  Laterality: N/A;   MULTIPLE EXTRACTIONS WITH ALVEOLOPLASTY  02/23/2012   Procedure: MULTIPLE EXTRACION WITH ALVEOLOPLASTY;  Surgeon: Glendia CHRISTELLA Primrose, DDS;  [facility];  Service: Oral Surgery;  Laterality: Bilateral;  Removal of left mandibular torus, multiple extractions with alveoloplasty   POLYPECTOMY  12/14/2017   Procedure: POLYPECTOMY;  Surgeon: Shaaron Lamar CHRISTELLA, MD;  [facility];  Service: Endoscopy;;   POLYPECTOMY  12/18/2022    Procedure: POLYPECTOMY;  Surgeon: Shaaron Lamar CHRISTELLA, MD;  [facility];  Service: Endoscopy;;   reversal of colostomy  10/2011   per patient, did not have report   right knee surgery     sigmoid colectomy with end colostomy, Hartmann's pouch  08/2010   [facility]. complicated diverticulitis   UMBILICAL HERNIA REPAIR      Family Psychiatric History: Unaware  Family History:  Family History  Problem Relation Age of Onset   Heart disease Mother    Hypertension Mother    Heart disease Father    Stomach cancer Father    Lung cancer Father    Throat cancer Father    Cancer Father        lung and throat cancer   Hypertension Father    Cancer Brother    Colon cancer Maternal Uncle    Throat cancer Paternal Uncle    Lung cancer Paternal Uncle    Throat cancer Paternal Uncle    Lung cancer Paternal Uncle    Throat cancer Paternal Uncle    Lung cancer Paternal Uncle    Liver disease Cousin        cirrhosis, etoh   Colon cancer Cousin 72    Social History:  Social History   Socioeconomic History   Marital status: Single    Spouse name: Not on file   Number of children: 0   Years of education: Not on file   Highest education level: GED or equivalent  Occupational History   Not on file  Tobacco Use   Smoking status: Every Day    Current packs/day: 1.00    Average packs/day: 1 pack/day for 37.0 years (37.0 ttl pk-yrs)    Types: Cigarettes    Passive exposure: Current   Smokeless tobacco: Former    Types: Chew   Tobacco comments:    Trying to quit.  Vaping Use   Vaping status: Former  Substance and Sexual Activity   Alcohol use: Not Currently   Drug use: Yes    Types: Marijuana    Comment: occasional   Sexual activity: Not on file  Other Topics Concern   Not on file  Social History Narrative   Not on file   Social Drivers of Health   Tobacco Use: High Risk (09/09/2024)   Patient History    Smoking Tobacco Use: Every Day    Smokeless Tobacco Use: Former     Passive Exposure: Current  Physicist, Medical Strain: Medium Risk (06/26/2024)   Overall Financial Resource Strain (CARDIA)    Difficulty of Paying Living Expenses: Somewhat hard  Food Insecurity: Medium Risk (09/15/2024)   Received from [facility]   Epic    Within the past 12 months, you worried that your food would run out before you got money to buy more: Sometimes true    Within the past 12 months, the food you bought just didn't last and you didn't have money to get more. : Sometimes true  Transportation Needs: Not on file (  09/15/2024)  Physical Activity: Unknown (06/26/2024)   Exercise Vital Sign    Days of Exercise per Week: Patient declined    Minutes of Exercise per Session: Not on file  Stress: Stress Concern Present (06/26/2024)   [employer] of Occupational Health - Occupational Stress Questionnaire    Feeling of Stress: Rather much  Social Connections: Socially Isolated (06/26/2024)   Social Connection and Isolation Panel    Frequency of Communication with Friends and Family: Three times a week    Frequency of Social Gatherings with Friends and Family: Never    Attends Religious Services: Patient declined    Active Member of Clubs or Organizations: No    Attends Banker Meetings: Not on file    Marital Status: Never married  Depression (PHQ2-9): High Risk (09/26/2024)   Depression (PHQ2-9)    PHQ-2 Score: 19  Alcohol Screen: Low Risk (06/20/2024)   Alcohol Screen    Last Alcohol Screening Score (AUDIT): 0  Housing: Low Risk (09/15/2024)   Received from [facility]   Epic    What is your living situation today?: I have a steady place to live    Think about the place you live. Do you have problems with any of the following? Choose all that apply:: None/None on this list  Utilities: Medium Risk (09/15/2024)   Received from [facility]   Utilities    In the past 12 months has the electric, gas, oil, or water  company threatened to shut off services in  your home? : Yes  Health Literacy: Adequate Health Literacy (05/12/2024)   B1300 Health Literacy    Frequency of need for help with medical instructions: Rarely    Allergies:  Allergies  Allergen Reactions   Aspirin  Other (See Comments)    Makes me feel bad.   Codeine Itching and Nausea And Vomiting   Latex Other (See Comments)    Turns skin red.    Morphine Hives   Statins Other (See Comments)    Joint pain   Zestril [Lisinopril]     Caused Kidney failure   Zetia  [Ezetimibe ] Other (See Comments)    Joint pain   Adhesive [Tape] Rash    Metabolic Disorder Labs: Lab Results  Component Value Date   HGBA1C 5.5 04/25/2020   MPG 111.15 04/25/2020   No results found for: PROLACTIN Lab Results  Component Value Date   CHOL 220 (H) 03/02/2024   TRIG 139 03/02/2024   HDL 34 (L) 03/02/2024   CHOLHDL 6.5 (H) 03/02/2024   VLDL 31 04/26/2020   LDLCALC 160 (H) 03/02/2024   LDLCALC 113 (H) 07/21/2023   Lab Results  Component Value Date   TSH 3.252 04/25/2020    Current Medications: Current Outpatient Medications  Medication Sig Dispense Refill   albuterol  (VENTOLIN  HFA) 108 (90 Base) MCG/ACT inhaler Inhale 1 puff into the lungs every 4 (four) hours as needed for wheezing or shortness of breath. 18 each 2   Blood Pressure Monitor DEVI Use as directed. 1 each 0   Buprenorphine  HCl-Naloxone  HCl (SUBOXONE ) 8-2 MG FILM PLACE 1 STRIPS UNDER THE TONGUE TWICE DAILY 60 Film 0   busPIRone  (BUSPAR ) 5 MG tablet Take 1 tablet (5 mg total) by mouth 3 (three) times daily. Take with Buspar  10 mg to total 15 mg daily 90 tablet 1   cycloSPORINE (RESTASIS) 0.05 % ophthalmic emulsion Place 1 drop into both eyes 2 (two) times daily.     fluticasone  (FLONASE ) 50 MCG/ACT nasal spray SPRAY 2  SPRAYS INTO EACH NOSTRIL EVERY DAY 48 mL 1   hydrocortisone  (ANUSOL -HC) 2.5 % rectal cream Place 1 Application rectally 2 (two) times daily as needed for hemorrhoids. 28 g 2   inclisiran (LEQVIO ) 284 MG/1.5ML  SOSY injection Inject 284 mg into the skin once.     isosorbide  mononitrate (IMDUR ) 30 MG 24 hr tablet Take 1 tablet (30 mg total) by mouth 2 (two) times daily. 60 tablet 5   metoprolol  succinate (TOPROL  XL) 25 MG 24 hr tablet Take 0.5 tablets (12.5 mg total) by mouth daily. 45 tablet 2   pantoprazole  (PROTONIX ) 40 MG tablet TAKE 1 TABLET BY MOUTH TWICE A DAY 180 tablet 2   predniSONE  (DELTASONE ) 20 MG tablet Take 3 tablets (60 mg total) by mouth daily with breakfast for 14 days, THEN 2.5 tablets (50 mg total) daily with breakfast for 7 days, THEN 2 tablets (40 mg total) daily with breakfast for 10 days. 80 tablet 0   QUEtiapine  (SEROQUEL ) 25 MG tablet Take 1 tablet (25 mg total) by mouth 2 (two) times daily as needed. 60 tablet 1   QUEtiapine  (SEROQUEL ) 50 MG tablet Take 1 tablet (50 mg total) by mouth at bedtime. 30 tablet 1   Valerian  Root 250 MG CAPS Take 1-2 capsules (250 to 500 mg) daily at bedtime as needed.     ARIPiprazole  (ABILIFY ) 5 MG tablet Take 1 tablet (5 mg total) by mouth daily. (Patient not taking: Reported on 09/09/2024) 60 tablet 1   busPIRone  (BUSPAR ) 10 MG tablet Take 1 tablet (10 mg total) by mouth 3 (three) times daily. Take with Buspar  5 mg to total 15 mg daily 90 tablet 1   Current Facility-Administered Medications  Medication Dose Route Frequency Provider Last Rate Last Admin   triamcinolone  acetonide (KENALOG -40) injection 20 mg  20 mg Other Once Janit Thresa HERO, DPM        Musculoskeletal: Strength & Muscle Tone: within normal limits Gait & Station: normal Patient leans: N/A  Psychiatric Specialty Exam: Review of Systems  Constitutional:        No other complaints voiced at this time  Psychiatric/Behavioral:  Positive for dysphoric mood and sleep disturbance (Improved). Negative for hallucinations, self-injury and suicidal ideas. Agitation: Irritability.The patient is nervous/anxious.   All other systems reviewed and are negative.   Blood pressure 133/86, pulse  65, height 5' 8.5 (1.74 m), weight 221 lb 3.2 oz (100.3 kg), SpO2 95%.Body mass index is 33.14 kg/m.  General Appearance: Casual  Eye Contact:  Good  Speech:  Clear and Coherent and Normal Rate  Volume:  Normal  Mood:  Anxious and Dysphoric  Affect:  Congruent  Thought Process:  Coherent, Goal Directed, and Descriptions of Associations: Intact  Orientation:  Full (Time, Place, and Person)  Thought Content: Logical   Suicidal Thoughts:  No  Homicidal Thoughts:  No  Memory:  Immediate;   Good Recent;   Good Remote;   Good  Judgement:  Intact  Insight:  Present  Psychomotor Activity:  Normal  Concentration:  Concentration: Good and Attention Span: Good  Recall:  Good  Fund of Knowledge: Good  Language: Good  Akathisia:  No  Handed:  Right  AIMS (if indicated): done  Assets:  Communication Skills Desire for Improvement Financial Resources/Insurance Housing Leisure Time Physical Health Resilience Social Support Transportation  ADL's:  Intact  Cognition: WNL  Sleep:  Fair   Screenings: Geneticist, Molecular Office Visit from 09/26/2024 in [facility]  at [facility] Visit from 08/01/2024 in [facility] at [facility] Visit from 06/20/2024 in [facility] at [place]  AIMS Total Score 0 0 0   GAD-7    Flowsheet Row Office Visit from 09/26/2024 in [facility] at [facility] Visit from 08/01/2024 in [facility] at [place] Office Visit from 06/20/2024 in [facility] at [place] Office Visit from 02/07/2021 in [facility] Dept Of [place]  Total GAD-7 Score 16 14 18 21    PHQ2-9    Flowsheet Row Office Visit from 09/26/2024 in [facility] at [facility] Visit from 08/01/2024 in [facility]  [facility] at [place] Office Visit from 07/25/2024 in [facility] - A Dept Of [place]. [facility] Clinical Support from 07/20/2024 in [facility] at [facility] Office Visit from 06/20/2024 in [facility] at [facility] Total Score 6 4 2  0 5  PHQ-9 Total Score 19 9 10  -- 20   Flowsheet Row Office Visit from 08/01/2024 in [facility] at [place] Admission (Discharged) from 06/22/2024 in [facility] Office Visit from 06/20/2024 in [facility] at [place]  C-SSRS RISK CATEGORY No Risk No Risk No Risk   Assessment and Plan: Assessment: Summary of today's assessment: [name] reported current medications were not effectively managing his mental health.  Discontinued Abilify  related to causing paranoia.  He reported eating and sleeping without difficulty.  He denies suicidal/self-harm/homicidal ideation, psychosis, paranoia, and abnormal movement.  Medication assessment completed and adjustments made. During visit [name] was dressed appropriately for age and current weather.  He was seated comfortably with no noted distress.  He was alert, oriented x 4, calm, cooperative, and attentive.  His mood was congruent with affect.  He had normal speech and behavior.  Objectively there was no evidence of psychosis, mania, or delusional thinking.  He  was able to converse coherently and responded appropriately with goal directed thoughts, no distractibility, or pre-occupation.  1. Severe episode of recurrent major depressive disorder, without psychotic features (HCC) - busPIRone  (BUSPAR ) 10 MG tablet; Take 1 tablet (10 mg total) by mouth 3 (three) times daily. Take with Buspar  5 mg to total 15 mg daily  Dispense: 90 tablet; Refill: 1  2. GAD (generalized anxiety disorder) - busPIRone  (BUSPAR ) 10 MG tablet; Take 1 tablet (10 mg total) by mouth 3  (three) times daily. Take with Buspar  5 mg to total 15 mg daily  Dispense: 90 tablet; Refill: 1    Plan: Medication management: Meds ordered this encounter  Medications   busPIRone  (BUSPAR ) 10 MG tablet    Sig: Take 1 tablet (10 mg total) by mouth 3 (three) times daily. Take with Buspar  5 mg to total 15 mg daily    Dispense:  90 tablet    Refill:  1    Supervising Provider:   ARFEEN, SYED T [[id number]]   busPIRone  (BUSPAR ) 5 MG tablet    Sig: Take 1 tablet (5 mg total) by mouth 3 (three) times daily. Take with Buspar  10 mg to total 15 mg daily    Dispense:  90 tablet    Refill:  1    Supervising Provider:   ARFEEN, SYED T [[id number]]   QUEtiapine  (SEROQUEL ) 50 MG tablet  Sig: Take 1 tablet (50 mg total) by mouth at bedtime.    Dispense:  30 tablet    Refill:  1    Supervising Provider:   CURRY, SYED T [[id number]]   QUEtiapine  (SEROQUEL ) 25 MG tablet    Sig: Take 1 tablet (25 mg total) by mouth 2 (two) times daily as needed.    Dispense:  60 tablet    Refill:  1    Supervising Provider:   ARFEEN, SYED T [[id number]]   Medications Discontinued During This Encounter  Medication Reason   busPIRone  (BUSPAR ) 10 MG tablet Reorder   Labs:  Not indicated at this time.    Other:  Counseling/Therapy:  Declined Services.   MERLE CIRELLI was instructed to call [phone], mobile crisis, or present to the nearest emergency room should he experiences any suicidal/homicidal ideation, auditory/visual/hallucinations, or detrimental worsening of his mental health condition.   Lamar JULIANNA Griffin participated in the development of this treatment plan and verbalized his understanding/agreement with plan as listed.   Follow Up: Return in 1 months for medication management Call in the interim for any side-effects, decompensation, questions, or problems  Collaboration of Care: Collaboration of Care: Medication Management AEB medication assessment, med adjustments, refill, started Seroquel  andBuSpar  Patient/Guardian  was advised Release of Information must be obtained prior to any record release in order to collaborate their care with an outside provider. Patient/Guardian was advised if they have not already done so to contact the registration department to sign all necessary forms in order for us  to release information regarding their care.   Consent: Patient/Guardian gives verbal consent for treatment and assignment of benefits for services provided during this visit. Patient/Guardian expressed understanding and agreed to proceed.    Bambie Pizzolato, NP 09/26/2024, 7:47 PM

## 2024-09-27 NOTE — Progress Notes (Signed)
 History of present illness:   Patient is a pleasant 59 year old male who comes in today for initial evaluation of his traumatic onset left knee pain that began on 09/13/2024 as a result of a fall from an 8 foot ladder.  At the time, he reports that he hit his head and attempted to stabilize himself with his hands and landed onto his knees as well as his right hand.  Multiple images were obtained including x-rays and CT scans with a CT of the left lower extremity demonstrating a subtendinous hematoma on the medial aspect of the left knee.  Today, patient reports that he is overall doing well in regards to his wrists/upper extremities and his right lower extremity though he has ongoing pain as well as popping to his left knee with motion.  He has been able to put weight on his knee and is wearing a normal shoe without the use of any assistive devices and is currently taking over-the-counter medications for pain which helped modestly but only temporarily.  He does have crutches which he will occasionally use if going out in public.  Past Medical History[1]  Past Surgical History[2]  Current Medications[3]  Allergies[4]  Family History[5]  Social History   Occupational History   Not on file  Tobacco Use   Smoking status: Every Day    Types: Cigarettes   Smokeless tobacco: Not on file  Substance and Sexual Activity   Alcohol use: Not Currently   Drug use: Yes    Types: Marijuana, Other    Comment: Xanax   Sexual activity: Not on file    10-point ROS negative unless as stated above.  Physical exam: There were no vitals taken for this visit.:   GEN:  NAD; Sitting comfortably in office, pleasant. HEAD: Normocephalic, atraumatic. EYES: Sclera non-icteric EOMI. ENT: Ears externally normal. No nasal congestion.  CARDIAC: Regular rate, distal pulses intact. LUNGS: Normal respiratory effort. No retractions or cough noted. NEURO: No focal neurologic deficits noted. PSYCH: Appropriate  affect. Alert and oriented. DERM: Warm, dry. Appropriate turgor.  MSK:  LLE: Abrasion to the medial aspect of the knee with light surrounding ecchymosis.  No active or expressible drainage.  There is underlying fluctuance to this area.  No palpable defects in the extensor mechanism and he is able to actively extend his knee and perform a straight leg raise.  There is significant guarding with any stressing of his knee limiting utility.  No calf swelling or tenderness.  Patient ambulates with a stiff-knee antalgic gait.  Sensation and motor function intact distally.  Foot warm and well-perfused brisk cap refill.  Imaging: Date: 09/27/24 Images: Left knee Findings: Home therapy imaging of the left knee demonstrates no obvious fractures or dislocations.  Moderate to advanced degenerative changes in the medial compartment with less severe changes seen laterally and in the patellofemoral compartment.  CT from [facility] from 09/13/24: Impression  1.  No acute fracture. 2.  Subcutaneous hematoma along the medial aspect of the knee. Recommend following up to resolution. 3.  Moderate medial and mild lateral patellofemoral compartmental degenerative changes. 4.  Small knee joint effusion.  Assessment/plan: Patient has a 2-week history of traumatic onset left knee pain that began as a result of a fall from an 8 foot ladder.  He has significant guarding with motion to his knee as well as popping to his knee which she reports will occasionally happen throughout the day with motion.  At this point, would like to obtain  an MRI of his knee to further evaluate.  Will provide him with a short course of tramadol  to see if this helps with some of his breakthrough pain and we reviewed over other routine pain control measures including ice, elevation as well as use of medication such as Tylenol  and anti-inflammatories.  I would like for him to work on some gentle range of motion exercises that were demonstrated  in office today to help prevent secondary stiffness.  He will follow back up with us  after the MRI of his knee is completed.  If the patient develops any new or worsening signs/symptoms or otherwise has any questions, he is advised to reach out to us  to discuss.  Gerlene Gobble, DO [facility] at [facility] [address] P: [phone] F: [phone]       [1] Past Medical History: Diagnosis Date   Hypertension    Stroke    (CMS-HCC)   [2] Past Surgical History: Procedure Laterality Date   FOOT SURGERY Left   [3] Current Outpatient Medications  Medication Sig Dispense Refill   ALPRAZolam (XANAX) 1 MG tablet Take 1 tablet (1 mg total) by mouth.     buprenorphine -naloxone  (SUBOXONE ) 8-2 mg sublingual film PLACE 1 STRIP UNDER THE TONGUE TWICE DAILY     cycloSPORINE (RESTASIS) 0.05 % ophthalmic emulsion Apply 1 drop to eye daily.     diclofenac  sodium (VOLTAREN ) 1 % gel APPLY 2 GRAMS  TOPICALLY 4 TIMES DAILY     docusate sodium  (COLACE) 100 MG capsule Take 1 capsule (100 mg total) by mouth two (2) times a day.     FLUoxetine  (PROZAC ) 10 MG capsule Take 1 capsule (10 mg total) by mouth daily.     fluticasone  propionate (FLONASE ) 50 mcg/actuation nasal spray 2 sprays into each nostril.     hydroCHLOROthiazide  (HYDRODIURIL ) 25 MG tablet Take 1 tablet (25 mg total) by mouth daily.     hydrocortisone  (ANUSOL -HC) 2.5 % rectal cream Insert 1 Application into the rectum.     hydrOXYzine  (ATARAX ) 10 MG tablet Take 1 tablet (10 mg total) by mouth.     isosorbide  mononitrate (IMDUR ) 30 MG 24 hr tablet Take 1 tablet (30 mg total) by mouth daily. TAKE 1 TABLET (30 MG TOTAL) BY MOUTH DAILY.     meloxicam  (MOBIC ) 15 MG tablet Take 1 tablet (15 mg total) by mouth daily.     methylPREDNISolone  (MEDROL  DOSEPACK) 4 mg tablet follow package directions (Patient not taking: Reported on 09/27/2024) 1 each 0   pantoprazole  (PROTONIX ) 40 MG tablet Take 1  tablet (40 mg total) by mouth daily.     predniSONE  (DELTASONE ) 20 MG tablet 3 po daily for 2 days, then 2 po daily for 2 days, then 1 po daily for 2 days (Patient not taking: Reported on 09/27/2024) 12 tablet 0   ranitidine (ZANTAC) 75 MG tablet Take 4 tablets (300 mg total) by mouth nightly.     spironolactone  (ALDACTONE ) 50 MG tablet Take 1 tablet (50 mg total) by mouth daily.     tizanidine (ZANAFLEX) 4 MG tablet Take 1 tablet (4 mg total) by mouth every eight (8) hours as needed. 60 tablet 0   triamcinolone  acetonide (KENALOG ) 40 mg/mL injection 0.5 mL (20 mg total).     VENTOLIN  HFA 90 mcg/actuation inhaler TAKE 2 PUFFS BY MOUTH EVERY 6 HOURS AS NEEDED FOR WHEEZE OR SHORTNESS OF BREATH     white petrolatum-mineral oil (REFRESH LACRI-LUBE) 56.8-42.5 % Oint  Administer 1 Application into the left eye daily as needed. Apply to left eye as needed for dryness and at night before you go to bed. 3.5 g 0   No current facility-administered medications for this visit.  [4] Allergies Allergen Reactions   Aspirin  Other (See Comments)    Makes me feel bad.   Ezetimibe  Other (See Comments)    Joint pain   Latex Other (See Comments)    Turns skin red.   Lisinopril    Opioids - Morphine Analogues    Statins-Hmg-Coa Reductase Inhibitors Other (See Comments)    Joint pain   Adhesive Rash   Codeine Hives, Itching and Nausea And Vomiting   Morphine Hives  [5] History reviewed. No pertinent family history.

## 2024-09-27 NOTE — Progress Notes (Unsigned)
 "  Office Visit Note  Patient: [name]             Date of Birth: 1965/11/29           MRN: [id number]             PCP: Celestina Czar, MD Referring: Celestina Czar, MD Visit Date: 10/10/2024 Occupation: Data Unavailable  Subjective:  No chief complaint on file.   History of Present Illness: [name] is a 59 y.o. male with Nonintractable headache and Polyarthralgia who presents for a 1 month follow up. He was last seen on 09/09/2024 where labs were obtained.     Activities of Daily Living:  Patient reports morning stiffness for *** {minute/hour:19697}.   Patient {ACTIONS;DENIES/REPORTS:21021675::Denies} nocturnal pain.  Difficulty dressing/grooming: {ACTIONS;DENIES/REPORTS:21021675::Denies} Difficulty climbing stairs: {ACTIONS;DENIES/REPORTS:21021675::Denies} Difficulty getting out of chair: {ACTIONS;DENIES/REPORTS:21021675::Denies} Difficulty using hands for taps, buttons, cutlery, and/or writing: {ACTIONS;DENIES/REPORTS:21021675::Denies}  No Rheumatology ROS completed.   PMFS History:  Patient Active Problem List   Diagnosis Date Noted   Bell's palsy 11/11/2023   Acute cough 10/14/2023   Benzodiazepine withdrawal (HCC) 10/14/2023   Anxiety 10/14/2023   Nocturnal hypoxia 07/13/2023   Bilateral hand pain 06/09/2023   Excessive daytime sleepiness 05/15/2023   Loud snoring 05/15/2023   Obesity (BMI 30.0-34.9) 05/15/2023   Moderate obstructive sleep apnea 02/23/2023   Lung nodules 02/23/2023   Constipation 11/28/2022   Carpal tunnel syndrome on both sides 11/24/2022   Heel pain, bilateral 07/29/2021   Healthcare maintenance 07/29/2021   Gout 05/24/2021   Epidermoid cyst of skin 02/18/2021   Erectile disorder due to medical condition in male 01/10/2021   Left knee pain 12/11/2020   Vitamin D deficiency 10/12/2020   Opioid use disorder, mild, in sustained remission, on maintenance therapy, abuse (HCC) 08/16/2020   Dizziness 05/23/2020    Cervical radiculopathy    Tobacco abuse    Hyperlipidemia    Chronic obstructive pulmonary disease (HCC) 12/14/2017   Dysphagia 09/24/2017   History of colonic polyps 09/24/2017   Intractable headache 05/06/2012   GERD (gastroesophageal reflux disease) 03/15/2012   Depression 10/05/2009   HTN (hypertension) 10/05/2009    Past Medical History:  Diagnosis Date   Acute pancreatitis    Anxiety    Arthritis    Bell's palsy    Colonic adenoma    COPD (chronic obstructive pulmonary disease) (HCC)    Depression    Diverticulitis of colon with perforation 08/2010   GERD (gastroesophageal reflux disease)    History of hiatal hernia    Hypertension    Polysubstance abuse (HCC)    Renal insufficiency    Sleep apnea    Intolerant of CPAP   Stroke (HCC) 2021   TIA (transient ischemic attack)     Family History  Problem Relation Age of Onset   Heart disease Mother    Hypertension Mother    Heart disease Father    Stomach cancer Father    Lung cancer Father    Throat cancer Father    Cancer Father        lung and throat cancer   Hypertension Father    Cancer Brother    Colon cancer Maternal Uncle    Throat cancer Paternal Uncle    Lung cancer Paternal Uncle    Throat cancer Paternal Uncle    Lung cancer Paternal Uncle    Throat cancer Paternal Uncle    Lung cancer Paternal Uncle    Liver disease Cousin  cirrhosis, etoh   Colon cancer Cousin 52   Past Surgical History:  Procedure Laterality Date   APPENDECTOMY     BALLOON DILATION N/A 08/04/2022   Procedure: BALLOON DILATION;  Surgeon: Cindie Carlin POUR, DO;  [facility];  Service: Endoscopy;  Laterality: N/A;   BIOPSY  12/14/2017   Procedure: BIOPSY;  Surgeon: Shaaron [name], MD;  [facility];  Service: Endoscopy;;  gastric    BIOPSY  10/13/2019   Procedure: BIOPSY;  Surgeon: Shaaron [name], MD;  [facility];  Service: Endoscopy;;  esophagus   BIOPSY  05/20/2021   Procedure:  BIOPSY;  Surgeon: Shaaron [name], MD;  [facility];  Service: Endoscopy;;   BIOPSY  12/18/2022   Procedure: BIOPSY;  Surgeon: Shaaron [name], MD;  [facility];  Service: Endoscopy;;   clamp left in colon after abd surgery     COLON SURGERY  05/2011   [facility]. exp laparotomy with colostomy takedown, lysis of adhesions (3hours), loop ileostomy. complicated by abscess formation requiring percutaneous drainage   colonoscopy via stoma with endoscopy of [place]  04/2011   [facility]. two sessile polyps in trv colon. complete resection but only partial retrieval. congested mucosa in area 3cm proximal to stoma, erythematous mucosa in Hartmann pouch. repeat tcs 8/2017path-adenomatous polyp   COLONOSCOPY WITH PROPOFOL  N/A 12/14/2017   Dr. Shaaron: 2 4 to 6 mm polyps in the cecum, tubular adenomas, single rectal polyp tubular adenoma, nonbleeding internal hemorrhoids.  Appears to be a surgical clip at the appendiceal orifice.  Next colonoscopy in 5 years   COLONOSCOPY WITH PROPOFOL  N/A 12/18/2022   Procedure: COLONOSCOPY WITH PROPOFOL ;  Surgeon: Shaaron [name], MD;  [facility];  Service: Endoscopy;  Laterality: N/A;  8:45 am   ESOPHAGEAL DILATION N/A 06/22/2024   Procedure: DILATION, ESOPHAGUS;  Surgeon: Shaaron [name], MD;  [facility];  Service: Endoscopy;  Laterality: N/A;   ESOPHAGEAL MANOMETRY N/A 12/11/2021   Procedure: ESOPHAGEAL MANOMETRY (EM);  Surgeon: Shila Gustav GAILS, MD;  [facility]: [facility];  Service: Endoscopy;  Laterality: N/A;   ESOPHAGOGASTRODUODENOSCOPY  03/05/2012   erosive reflux esophagitis, bulbar erosions, gastric erosions/petichae, bx showed gastritis, no H.Pylori. Procedure: ESOPHAGOGASTRODUODENOSCOPY (EGD);  Surgeon: [name] Shaaron, MD;  [facility];  Service: Endoscopy;  Laterality: N/A;  11:30   ESOPHAGOGASTRODUODENOSCOPY N/A 06/22/2024   Procedure: EGD (ESOPHAGOGASTRODUODENOSCOPY);  Surgeon: Shaaron [name],  MD;  [facility];  Service: Endoscopy;  Laterality: N/A;  11:00am, asa 3   ESOPHAGOGASTRODUODENOSCOPY (EGD) WITH PROPOFOL  N/A 12/14/2017   Dr. Shaaron: Erosive reflux esophagitis with questionable mild stricturing/non-obstruction at this level status post dilation.  multiple gastric erosions in the stomach biopsy showed reactive gastropathy but no H. pylori.   ESOPHAGOGASTRODUODENOSCOPY (EGD) WITH PROPOFOL  N/A 10/13/2019   negative Barrett's, normal stomach   ESOPHAGOGASTRODUODENOSCOPY (EGD) WITH PROPOFOL  N/A 05/20/2021   inlet patch with superimposed nodule s/p biopsy. Normal esophagus s/p dilation. Normal gastric mucosa. Normal duodenum. Reflux changes on path.   ESOPHAGOGASTRODUODENOSCOPY (EGD) WITH PROPOFOL  N/A 08/04/2022   Procedure: ESOPHAGOGASTRODUODENOSCOPY (EGD) WITH PROPOFOL ;  Surgeon: Cindie Carlin POUR, DO;  [facility];  Service: Endoscopy;  Laterality: N/A;  1:15 pm   ESOPHAGOGASTRODUODENOSCOPY (EGD) WITH PROPOFOL  N/A 12/18/2022   Procedure: ESOPHAGOGASTRODUODENOSCOPY (EGD) WITH PROPOFOL ;  Surgeon: Shaaron [name], MD;  [facility];  Service: Endoscopy;  Laterality: N/A;   FOOT SURGERY Left    strethching of  ligaments   HERNIA REPAIR     umbilical   KNEE SURGERY Right 2006   MALONEY DILATION N/A 12/14/2017   Procedure: AGAPITO DILATION;  Surgeon: Shaaron [name], MD;  [facility];  Service: Endoscopy;  Laterality: N/A;   MALONEY DILATION  05/20/2021   Procedure: AGAPITO DILATION;  Surgeon: Shaaron [name], MD;  [facility];  Service: Endoscopy;;   MALONEY DILATION N/A 12/18/2022   Procedure: AGAPITO HODGKIN;  Surgeon: Shaaron [name], MD;  [facility];  Service: Endoscopy;  Laterality: N/A;   MULTIPLE EXTRACTIONS WITH ALVEOLOPLASTY  02/23/2012   Procedure: MULTIPLE EXTRACION WITH ALVEOLOPLASTY;  Surgeon: Glendia HERO Primrose, DDS;  Location: [facility];  Service: Oral Surgery;  Laterality: Bilateral;  Removal of left  mandibular torus, multiple extractions with alveoloplasty   POLYPECTOMY  12/14/2017   Procedure: POLYPECTOMY;  Surgeon: Shaaron [name], MD;  [facility];  Service: Endoscopy;;   POLYPECTOMY  12/18/2022   Procedure: POLYPECTOMY;  Surgeon: Shaaron [name], MD;  [facility];  Service: Endoscopy;;   reversal of colostomy  10/2011   per patient, did not have report   right knee surgery     sigmoid colectomy with end colostomy, Hartmann's pouch  08/2010   [facility]. complicated diverticulitis   UMBILICAL HERNIA REPAIR     Social History[1] Social History   Social History Narrative   Not on file     Immunization History  Administered Date(s) Administered   Influenza, Seasonal, Injecte, Preservative Fre 06/26/2011, 05/12/2024   Influenza,inj,Quad PF,6+ Mos 05/23/2020, 07/29/2021   Moderna Sars-Covid-2 Vaccination 12/22/2019, 01/18/2020   Pneumococcal Polysaccharide-23 09/03/2011   Tdap 05/07/1997, 11/07/2004, 05/27/2005, 09/13/2020     Objective: Vital Signs: There were no vitals taken for this visit.   Physical Exam   Musculoskeletal Exam: ***  CDAI Exam: CDAI Score: -- Patient Global: --; Provider Global: -- Swollen: --; Tender: -- Joint Exam 10/10/2024   No joint exam has been documented for this visit   There is currently no information documented on the homunculus. Go to the Rheumatology activity and complete the homunculus joint exam.  Investigation: No additional findings.  Imaging: No results found.  Recent Labs: Lab Results  Component Value Date   WBC 12.3 (H) 06/15/2024   HGB 15.1 06/15/2024   PLT 224 06/15/2024   NA 140 06/15/2024   K 4.3 06/15/2024   CL 104 06/15/2024   CO2 24 06/15/2024   GLUCOSE 149 (H) 06/15/2024   BUN 25 (H) 06/15/2024   CREATININE 1.31 (H) 06/15/2024   BILITOT 0.2 06/15/2024   ALKPHOS 77 06/15/2024   AST 18 06/15/2024   ALT 19 06/15/2024   PROT 7.6 06/15/2024   ALBUMIN 4.2 06/15/2024   CALCIUM  9.4  06/15/2024   GFRAA 89 05/23/2020    Speciality Comments: No specialty comments available.  Procedures:  No procedures performed Allergies: Aspirin , Codeine, Latex, Morphine, Statins, Zestril [lisinopril], Zetia  [ezetimibe ], and Adhesive [tape]   Assessment / Plan:     Visit Diagnoses: No diagnosis found.  Orders: No orders of the defined types were placed in this encounter.  No orders of the defined types were placed in this encounter.   Face-to-face time spent with patient was *** minutes. Greater than 50% of time was spent in counseling and coordination of care.  Follow-Up Instructions: No follow-ups on file.   Alfonso Patterson, LPN  Note - This record has been created using [employer].  Chart creation errors have  been sought, but may not always  have been located. Such creation errors do not reflect on  the standard of medical care.    [1]  Social History Tobacco Use   Smoking status: Every Day    Current packs/day: 1.00    Average packs/day: 1 pack/day for 37.0 years (37.0 ttl pk-yrs)    Types: Cigarettes    Passive exposure: Current   Smokeless tobacco: Former    Types: Chew   Tobacco comments:    Trying to quit.  Vaping Use   Vaping status: Former  Substance Use Topics   Alcohol use: Not Currently   Drug use: Yes    Types: Marijuana    Comment: occasional   "

## 2024-09-29 ENCOUNTER — Ambulatory Visit: Payer: Self-pay

## 2024-10-04 ENCOUNTER — Telehealth: Payer: Self-pay

## 2024-10-04 NOTE — Telephone Encounter (Addendum)
 COPD : RN Telephone Call   Patient ID: [name], male    DOB: 1966-07-03  Age: 59 y.o. MRN: [id number]  [name] is a 59 y.o. who was called about COPD management.   Patient reports that they are taking the following medications for COPD: YES   Patient reports they DO or DO NOT have a prior pulmonologist who they follow: [facility]   Please document smoking status, how many packs? Ready to quit ?  - Smoking Hx: He currently smokes 1 packs per day.   mMRC (Modified [employer]) Dyspnea Scale   Ask the following questions and please check the box that describes the most.   Choose ONE>   []  Grade 0: I only get breathless with strenuous exercise   []  Grade 1: I get short of breath when hurry on level ground or walking up a slight hill   [x]  Grade 2: On level Ground, I walk slower than people of the same age because of breathlessness or have to stop for breath when walking my own pace   []  Grade 3: I Stop for breath after walking about 100 yards or after a few minutes on level ground   []  Grade 4: I am too breathless to leave the house, or I am breathless when dressing    CAT (COPD Assessment Test)   Please ask each of the following questions and select between 0-5 based on what describes the patient most accurately.  On a scale 0 to 5, do you never cough or cough all the time: 1  2 3 4   0 (I am not limited doing any activities at home) 5 (I am not at all confident leaving my home because of my lung condition) 5 (I don't sleep soundly because of my lung condition) On a scale 0 to 5, how would you describe your energy?: 5 (I have no energy at all)  Total: 25

## 2024-10-10 ENCOUNTER — Encounter

## 2024-10-10 DIAGNOSIS — R030 Elevated blood-pressure reading, without diagnosis of hypertension: Secondary | ICD-10-CM

## 2024-10-10 DIAGNOSIS — M2550 Pain in unspecified joint: Secondary | ICD-10-CM

## 2024-10-10 DIAGNOSIS — R519 Headache, unspecified: Secondary | ICD-10-CM

## 2024-10-12 ENCOUNTER — Ambulatory Visit: Payer: Self-pay | Admitting: Student

## 2024-10-12 ENCOUNTER — Encounter: Payer: Self-pay | Admitting: Student

## 2024-10-12 VITALS — BP 124/85 | HR 73 | Ht 68.5 in | Wt 211.0 lb

## 2024-10-12 DIAGNOSIS — Z122 Encounter for screening for malignant neoplasm of respiratory organs: Secondary | ICD-10-CM

## 2024-10-12 DIAGNOSIS — Z720 Tobacco use: Secondary | ICD-10-CM

## 2024-10-12 DIAGNOSIS — J449 Chronic obstructive pulmonary disease, unspecified: Secondary | ICD-10-CM

## 2024-10-12 MED ORDER — NICOTINE 14 MG/24HR TD PT24: 14.0000 mg | MEDICATED_PATCH | TRANSDERMAL | 6 refills | Status: AC

## 2024-10-12 NOTE — Patient Instructions (Addendum)
 [name], Thank you for allowing me to take part in your care today.  Here are your instructions.  1.  I am going to write you nicotine  patches  2.  I am going to refer you for lung function test.  This will help characterize your COPD better  3.  Please return in 6 months for chronic condition follow-up   PLEASE BRING YOUR MEDICATIONS TO EVERY APPOINTMENT  Thank you, Dr. Tobie  If you have any other questions please contact the internal medicine clinic at [phone] If it is after hours, please call the [facility] at [phone] and then ask the person who picks up for the resident on call.

## 2024-10-12 NOTE — Progress Notes (Signed)
 "  CC: COPD follow up   HPI:  [name] is a 59 y.o. male with past medical history of hypertension, COPD, lung nodules, GERD who presents for COPD follow up.  Please see assessment and plan for full HPI.   Medications: Allergic rhinitis: Flonase  2 sprays daily COPD: Albuterol  1 puff every 4 hours as needed GERD: Protonix  40 mg twice daily Hemorrhoids: Anusol  cream OUD: Suboxone  8-2 mg twice daily Chronic pain: Lyrica  75 mg twice daily Anxiety/depression: Hydroxyzine  10 mg 3 times daily, BuSpar  15 mg 3 times daily, Seroquel  50 mg nightly and 25 mg twice daily Hyperlipidemia: Leqvio  Hypertension: Imdur  30 mg twice daily, metoprolol  succinate 12.5 mg daily GCA: Prednisone  60 mg daily Dry eyes: Cyclosporine eyedrops twice daily  Past Medical History:  Diagnosis Date   Acute pancreatitis    Anxiety    Arthritis    Bell's palsy    Colonic adenoma    COPD (chronic obstructive pulmonary disease) (HCC)    Depression    Diverticulitis of colon with perforation 08/2010   GERD (gastroesophageal reflux disease)    History of hiatal hernia    Hypertension    Polysubstance abuse (HCC)    Renal insufficiency    Sleep apnea    Intolerant of CPAP   Stroke (HCC) 2021   TIA (transient ischemic attack)     Current Medications[1]  Review of Systems:    Negative except for what is stated in HPI  Physical Exam:  Vitals:   10/12/24 1437  BP: 124/85  Pulse: 73  TempSrc: Oral  SpO2: 96%  Weight: 211 lb (95.7 kg)  Height: 5' 8.5 (1.74 m)   General: Patient is sitting comfortably in the room  Head: Normocephalic, atraumatic  Cardio: Regular rate and rhythm, no murmurs, rubs or gallops Pulmonary: Clear to ausculation bilaterally with no rales, rhonchi, and crackles    Assessment & Plan:   Assessment & Plan Chronic obstructive pulmonary disease, unspecified COPD type (HCC) Patient presents for COPD follow-up.  Patient was diagnosed in 2017 at outside hospital with  PFTs.  During that time he had an acute exacerbation, and had PFTs.  Unclear what the results were of the PFTs as I am unable to see the records.  Recommended formal PFTs today.  Patient agreed.  mMRC grade 2.  CAT score 25.  He states his symptom burden has been less.  He uses his albuterol  either 1 or 2 times a week.  Before adjusting inhalers, will plan to obtain PFTs.  Discussed tobacco cessation today.  Patient agreed to start nicotine  patches as Chantix has not worked for him.  Inhaler teaching provided.  Patient does not have troubles affording his inhaler.  Plan: - Continue albuterol  as needed - Follow-up PFTs - Inhaler teaching provided - Start nicotine  patches for tobacco cessation - Symptom burden has decreased and anticipate with tobacco cessation will be able to have even better symptom improvement - If PFTs show severe obstruction, could start LAMA/LABA. Screening for lung cancer Low-dose CT scan ordered for lung cancer screening. Tobacco abuse Discussed tobacco cessation therapy today.  Patient is ready to quit.  He has tried Chantix before, but this did not work.  Would prefer nicotine  patches.  Plan: - 6-minute spent on tobacco cessation counseling - Nicotine  patches provided   Patient discussed with Dr. Trudy Libby Blanch, DO Internal Medicine Resident PGY-3     [1]  Current Outpatient Medications:    albuterol  (VENTOLIN  HFA) 108 (90 Base) MCG/ACT  inhaler, Inhale 1 puff into the lungs every 4 (four) hours as needed for wheezing or shortness of breath., Disp: 18 each, Rfl: 2   ARIPiprazole  (ABILIFY ) 5 MG tablet, Take 1 tablet (5 mg total) by mouth daily. (Patient not taking: Reported on 09/09/2024), Disp: 60 tablet, Rfl: 1   Blood Pressure Monitor DEVI, Use as directed., Disp: 1 each, Rfl: 0   Buprenorphine  HCl-Naloxone  HCl (SUBOXONE ) 8-2 MG FILM, PLACE 1 STRIPS UNDER THE TONGUE TWICE DAILY, Disp: 60 Film, Rfl: 0   busPIRone  (BUSPAR ) 10 MG tablet, Take 1 tablet (10 mg  total) by mouth 3 (three) times daily. Take with Buspar  5 mg to total 15 mg daily, Disp: 90 tablet, Rfl: 1   busPIRone  (BUSPAR ) 5 MG tablet, Take 1 tablet (5 mg total) by mouth 3 (three) times daily. Take with Buspar  10 mg to total 15 mg daily, Disp: 90 tablet, Rfl: 1   cycloSPORINE (RESTASIS) 0.05 % ophthalmic emulsion, Place 1 drop into both eyes 2 (two) times daily., Disp: , Rfl:    fluticasone  (FLONASE ) 50 MCG/ACT nasal spray, SPRAY 2 SPRAYS INTO EACH NOSTRIL EVERY DAY, Disp: 48 mL, Rfl: 1   hydrocortisone  (ANUSOL -HC) 2.5 % rectal cream, Place 1 Application rectally 2 (two) times daily as needed for hemorrhoids., Disp: 28 g, Rfl: 2   inclisiran (LEQVIO ) 284 MG/1.5ML SOSY injection, Inject 284 mg into the skin once., Disp: , Rfl:    isosorbide  mononitrate (IMDUR ) 30 MG 24 hr tablet, Take 1 tablet (30 mg total) by mouth 2 (two) times daily., Disp: 60 tablet, Rfl: 5   metoprolol  succinate (TOPROL  XL) 25 MG 24 hr tablet, Take 0.5 tablets (12.5 mg total) by mouth daily., Disp: 45 tablet, Rfl: 2   pantoprazole  (PROTONIX ) 40 MG tablet, TAKE 1 TABLET BY MOUTH TWICE A DAY, Disp: 180 tablet, Rfl: 2   QUEtiapine  (SEROQUEL ) 25 MG tablet, Take 1 tablet (25 mg total) by mouth 2 (two) times daily as needed., Disp: 60 tablet, Rfl: 1   QUEtiapine  (SEROQUEL ) 50 MG tablet, Take 1 tablet (50 mg total) by mouth at bedtime., Disp: 30 tablet, Rfl: 1   Valerian  Root 250 MG CAPS, Take 1-2 capsules (250 to 500 mg) daily at bedtime as needed., Disp: , Rfl:   "

## 2024-10-12 NOTE — Assessment & Plan Note (Signed)
 Patient presents for COPD follow-up.  Patient was diagnosed in 2017 at outside hospital with PFTs.  During that time he had an acute exacerbation, and had PFTs.  Unclear what the results were of the PFTs as I am unable to see the records.  Recommended formal PFTs today.  Patient agreed.  mMRC grade 2.  CAT score 25.  He states his symptom burden has been less.  He uses his albuterol  either 1 or 2 times a week.  Before adjusting inhalers, will plan to obtain PFTs.  Discussed tobacco cessation today.  Patient agreed to start nicotine  patches as Chantix has not worked for him.  Inhaler teaching provided.  Patient does not have troubles affording his inhaler.  Plan: - Continue albuterol  as needed - Follow-up PFTs - Inhaler teaching provided - Start nicotine  patches for tobacco cessation - Symptom burden has decreased and anticipate with tobacco cessation will be able to have even better symptom improvement - If PFTs show severe obstruction, could start LAMA/LABA.

## 2024-10-12 NOTE — Assessment & Plan Note (Signed)
 Discussed tobacco cessation therapy today.  Patient is ready to quit.  He has tried Chantix before, but this did not work.  Would prefer nicotine  patches.  Plan: - 6-minute spent on tobacco cessation counseling - Nicotine  patches provided

## 2024-10-13 ENCOUNTER — Other Ambulatory Visit: Payer: Self-pay

## 2024-10-13 ENCOUNTER — Encounter: Attending: Internal Medicine

## 2024-10-13 ENCOUNTER — Other Ambulatory Visit: Payer: Self-pay | Admitting: Student

## 2024-10-13 NOTE — Telephone Encounter (Signed)
 Medication prescribed by a provider not with [facility].

## 2024-10-14 ENCOUNTER — Encounter: Payer: Self-pay | Admitting: Nurse Practitioner

## 2024-10-14 ENCOUNTER — Ambulatory Visit: Admitting: Nurse Practitioner

## 2024-10-14 VITALS — BP 130/82 | HR 88 | Ht 67.0 in | Wt 214.4 lb

## 2024-10-14 DIAGNOSIS — E782 Mixed hyperlipidemia: Secondary | ICD-10-CM

## 2024-10-14 DIAGNOSIS — Z79899 Other long term (current) drug therapy: Secondary | ICD-10-CM

## 2024-10-14 DIAGNOSIS — M79606 Pain in leg, unspecified: Secondary | ICD-10-CM

## 2024-10-14 DIAGNOSIS — R2689 Other abnormalities of gait and mobility: Secondary | ICD-10-CM

## 2024-10-14 MED ORDER — ISOSORBIDE MONONITRATE ER 30 MG PO TB24: ORAL_TABLET | ORAL | 3 refills | Status: AC

## 2024-10-14 NOTE — Progress Notes (Unsigned)
 " Cardiology Office Note:  .   Date:  09/06/2024 ID:  [name], DOB 12-12-1965, MRN [id number] PCP: Celestina Czar, MD  [facility] Providers Cardiologist:  Jayson Sierras, MD {  History of Present Illness: .   [name] is a 59 y.o. male with a PMH of coronary and aortic atherosclerosis, chest pain, mixed HLD, polysubstance abuse, HTN, HLD, COPD, sleep apnea (intolerant to CPAP), hx of CVA in 2022 (self-reported), pulmonary nodules, depression, and self reported history of carotid artery stenosis, who presents today for 6-8 week follow-up.   Last seen by Dr. Sierras on November 21, 2020. Was referred for evaluation of chest discomfort. Lexiscan  Myoview  was obtained and was normal.   ED visit on February 18, 2023 for intermitent epigastric chest discomfort at [facility] Described more CP at rest, described as burning sensation at center of his chest. CTA of chest revealed no acute PE with evidence of new small solid nodules medially in left lower lobe, hepatic steatosis. CXR showed patchy opacities in LLL. UDS positive for Marijuana and benzos. Trops negative.   Saw patient for follow-up on March 27, 2023.  He noted CP started one month ago. Described it as sharp/indigestion, right-sided, occurred more with exertion, lasted for a few minutes, and sat down and improved, noticed with working outside. Rates 7-8/10 when it occurs. Denied any shortness of breath, palpitations, syncope, presyncope, dizziness, orthopnea, PND, swelling or significant weight changes, acute bleeding, or claudication. Stated he is not on any oxycodone  or cocaine. Echo benign. NST revealed mild decreased activity along inferior wall, findings consistent with small area of mild ischemia.  Study was determined to be low risk.  07/03/2023 - Doing well. Denies any recurrent CP since last office visit. Denies any chest pain, shortness of breath, palpitations, syncope, presyncope, dizziness, orthopnea, PND, swelling or  significant weight changes, acute bleeding, or claudication. Tolerating medications well. Cannot tolerate statins.   01/01/2024 - Presents today for follow-up.  Tells me he experienced left-sided facial droopiness with numbness in March, also could not blink out of his left eye, symptoms lasted for about 2 weeks.  Says he was diagnosed with Bell's palsy. Says symptoms have improved. Denies any recent stroke-like symptoms.  He is scheduled to see Dr. Mona in June. Says when he bends over and stands up, he gets dizzy. Tells me he gets dizzy spells often. Denies any chest pain, shortness of breath, palpitations, syncope, presyncope, orthopnea, PND, swelling or significant weight changes, acute bleeding, or claudication.  04/01/2024  - patient comes in today for 64-month follow-up appointment.  Initial EKG gave preliminary report of acute STEMI with ST segment elevation in leads II, III and aVF.  Repeat EKG showed similar preliminary report with acute STEMI.  Upon entering patient room today, he does not appear well.  He is clammy, clutching his chest and describes chest pain that began at 1030 this morning.  He tells me he has had intermittent sharp chest pain for a while, but says he has never had chest pain this severe before.  He describes it as sharp, at the center of his chest, 10 out of 10 in intensity.  Tells me he feels short of breath with this.  Vital signs are all stable. Inpatient Cardiology was consulted along with DOD, Dr. Wilbert Bihari who is at [facility]. Dr. Gordy Bergamo looked at EKG images and stated no STEMI, see below.  ED visit 04/01/2024 at [facility] for nonspecific chest pain, workup overall unremarkable. R/O  for ACS, trops negative.   04/21/2024 - Today he presents for ED follow-up. Doing better. Tells me he has had a few episodes of chest pain since I last saw him, but not as severe. Currently receiving Leqvio  infusions. Used to see Dr. Rachele in the past, does not see him anymore. Denies any  recent chest pain, shortness of breath, palpitations, syncope, presyncope, dizziness, orthopnea, PND, swelling or significant weight changes, acute bleeding, or claudication.  06/07/2024 -tells me about a recent ED visit last month for Bell's palsy, he notes left-sided facial droop.  He tells me he has had this before, currently receiving treatment for this. Denies any acute stroke like symptoms.  Tells me he has had a couple chest pain episodes since last office visit, says almost went to the ED for 1 episode but did not. Says episodes are similar to previous office visit on 04/01/2024. Denies any active chest pain today. Denies any shortness of breath, palpitations, syncope, presyncope, dizziness, orthopnea, PND, swelling or significant weight changes, acute bleeding, or claudication.  07/05/2024 - Here for follow-up. Continues to note issues with Bell's palsy, remains on prednisone  and admits to weight gain. Admits to HA associated with this, denies any red flag signs or symptoms. Denies any CP but admits to indigestion issues. Admits to elevated BP readings. Denies any chest pain, shortness of breath, palpitations, syncope, presyncope, dizziness, orthopnea, PND, swelling or significant weight changes, acute bleeding, or claudication.  ED visit since last office visit - pt noted multiple symtpoms. R/o for ACS as he admitted to CP.   09/06/2024 - Still continues to note headache, scheduled to see rheumatology this week. Also tells me he is scheduled to see a Neurologist this year. Does note several episodes of CP since I have last seen him, not bothersome and worrisome to him, random in occurrence, admits any specific exertional symptoms, lasts different lengths. Tells me he is getting himself weaned off Suboxone . He confirms with me he is only taking Imdur  30 mg daily instead of BID. Denies any shortness of breath, palpitations, syncope, presyncope, dizziness, orthopnea, PND, swelling or significant weight  changes, acute bleeding, or claudication.  SH: Occasionally uses marijuana and smokes 1 PPD, trying to quit. Denies any alcohol use.   Studies Reviewed: SABRA    EKG: EKG is not ordered today.       Carotid duplex 05/2023: Summary:  Right Carotid: Velocities in the right ICA are consistent with a 1-39%  stenosis. Non-hemodynamically significant plaque <50% noted in the  CCA. The ECA appears <50% stenosed.   Left Carotid: Velocities in the left ICA are consistent with a 1-39%  stenosis. Non-hemodynamically significant plaque <50% noted in the  CCA. The ECA appears <50% stenosed.   Vertebrals:  Bilateral vertebral arteries demonstrate antegrade flow.  Subclavians: Normal flow hemodynamics were seen in bilateral subclavian arteries.  Lexiscan  04/2023:   Lexiscan  stress without EKG changes   Myoview  scan stress images show mild decreased activity in the inferior wall (mid/distal). In the recovery images there was improvement in this region consistent with a small area of mild ischemia. When compared to scan from 2019, subtle thinning present in previous study  but in both the  rest and the stress images.   Note, motion during study may interfere with accuracy.   LVEF calculated at 70% with normal wall motion   Overall low risk study  Echo 03/2023: 1. Left ventricular ejection fraction, by estimation, is 60 to 65%. The  left ventricle has  normal function. The left ventricle has no regional  wall motion abnormalities. There is mild left ventricular hypertrophy.  Left ventricular diastolic parameters  are consistent with Grade I diastolic dysfunction (impaired relaxation).   2. Right ventricular systolic function is normal. The right ventricular  size is normal. Tricuspid regurgitation signal is inadequate for assessing  PA pressure.   3. The mitral valve is normal in structure. No evidence of mitral valve  regurgitation. No evidence of mitral stenosis.   4. The aortic valve is tricuspid.  Aortic valve regurgitation is not  visualized. No aortic stenosis is present.   5. The inferior vena cava is normal in size with greater than 50%  respiratory variability, suggesting right atrial pressure of 3 mmHg.   Comparison(s): No significant change from prior study.  Lexiscan  11/2020:  No diagnostic ST segment changes to indicate ischemia. No significant myocardial perfusion defects on stress imaging to indicate scar or ischemia. There is diaphragmatic attenuation noted, mainly on the rest images. This is a low risk study. Nuclear stress EF: 63%.   Physical Exam:   VS:  BP 130/82 (BP Location: Left Arm)   Pulse 88   Ht 5' 7 (1.702 m)   Wt 214 lb 6.4 oz (97.3 kg)   SpO2 95%   BMI 33.58 kg/m    Wt Readings from Last 3 Encounters:  10/14/24 214 lb 6.4 oz (97.3 kg)  10/12/24 211 lb (95.7 kg)  09/09/24 218 lb (98.9 kg)   GEN: Obese, 59 y.o. male in no acute distress NECK: No JVD; No carotid bruits CARDIAC: S1/S2, RRR, no murmurs, rubs, gallops RESPIRATORY:  Clear to auscultation without rales, wheezing or rhonchi  ABDOMEN: Soft, non-tender, non-distended EXTREMITIES:  No edema; No deformity    ASSESSMENT AND PLAN: .    Coronary atherosclerosis and aortic atherosclerosis, chest pain of uncertain etiology Does admit to some CP episodes - see HPI. Cannot tolerate Aspirin . Cannot tolerate statins or Zetia  - have added to his allergy list.  Currently receiving Leqvio . Will incresae Imdur  to 30 mg BID and will stop Aldactone  - see below. No other medication changes at this time. Heart healthy diet and regular cardiovascular exercise encouraged. Care and ED precautions discussed.     2. HTN, orthostatic hypotension BP normal today. SBP goal < 130.  Denies any recent orthostatic symptoms recently. Cannot start amlodipine  as this caused lower extremity swelling and lisinopril caused AKI.  Will stop Aldactone  and increase Imdur  to 30 mg BID. Discussed conservative measures to help  improve his symptoms. Discussed to monitor BP at home at least 2 hours after medications and sitting for 5-10 minutes.    3. Mixed HLD Hx of multiple statin intolerances and could not tolerate Zetia .  He is receiving Leqvio  infusions. Continue to follow-up with Dr. Mona. Will obtain FLP.    4. Hx of CVA, carotid artery stenosis Denies any symptoms. Continue to follow with PCP.  Cannot tolerate low dose Aspirin , unable to tolerate statins. Currently receiving Leqvio  infusions. Heart healthy diet and regular cardiovascular exercise encouraged. Checking FLP as noted above.    5. Polysubstance abuse Substance abuse cessation encouraged and discussed. He verbalized understanding.   6. CKD stage 3 Most recent labs show sCr at 1.42 with eGFR 57. No med changes besides what is noted above. Avoid nephrotoxic agents.  Continue follow-up with PCP.  7. Bell's palsy, headache Denies any progressive or worsening symptoms.  Symptoms are stable since last office visit.  Follow-up with Rheumatology and Neuro  as scheduled. Care and ED precautions discussed.    Dispo: Care and ED precautions discussed. Follow-up with Dr. Debera or APP in 4-6 weeks or sooner if anything changes.   Signed, Almarie Crate, NP   "

## 2024-10-14 NOTE — Patient Instructions (Addendum)
 Medication Instructions:  Your physician has recommended you make the following change in your medication:  Increase Imdur  to 45 mg in the a.m and 30 mg in the p.m  Continue taking all other medications as prescribed   Labwork: Fasting Lipid Panel and CMET to be completed today at [facility]  Testing/Procedures: Your physician has requested that you have a carotid duplex. This test is an ultrasound of the carotid arteries in your neck. It looks at blood flow through these arteries that supply the brain with blood. Allow one hour for this exam. There are no restrictions or special instructions.  Your physician has requested that you have an ankle brachial index (ABI). During this test an ultrasound and blood pressure cuff are used to evaluate the arteries that supply the arms and legs with blood. Allow thirty minutes for this exam. There are no restrictions or special instructions.  Please note: We ask at that you not bring children with you during ultrasound (echo/ vascular) testing. Due to room size and safety concerns, children are not allowed in the ultrasound rooms during exams. Our front office staff cannot provide observation of children in our lobby area while testing is being conducted. An adult accompanying a patient to their appointment will only be allowed in the ultrasound room at the discretion of the ultrasound technician under special circumstances. We apologize for any inconvenience.   Follow-Up: Your physician recommends that you schedule a follow-up appointment in: 3 months  Any Other Special Instructions Will Be Listed Below (If Applicable). Thank you for choosing [facility]!     If you need a refill on your cardiac medications before your next appointment, please call your pharmacy.

## 2024-10-17 ENCOUNTER — Ambulatory Visit (HOSPITAL_COMMUNITY): Payer: PRIVATE HEALTH INSURANCE | Admitting: Psychiatry

## 2024-10-18 ENCOUNTER — Ambulatory Visit: Admitting: Nurse Practitioner

## 2024-11-02 ENCOUNTER — Ambulatory Visit

## 2024-11-30 ENCOUNTER — Ambulatory Visit: Admitting: Neurology

## 2024-12-01 ENCOUNTER — Encounter (HOSPITAL_COMMUNITY)

## 2025-01-17 ENCOUNTER — Ambulatory Visit

## 2025-01-20 ENCOUNTER — Ambulatory Visit: Admitting: Nurse Practitioner
